# Patient Record
Sex: Male | Born: 1970
Health system: Southern US, Community
[De-identification: ages and names within clinical notes are randomized; demographics above are authoritative.]

## PROBLEM LIST (undated history)

## (undated) DIAGNOSIS — Z9119 Patient's noncompliance with other medical treatment and regimen: Secondary | ICD-10-CM

## (undated) DIAGNOSIS — M545 Low back pain, unspecified: Secondary | ICD-10-CM

## (undated) DIAGNOSIS — Z87442 Personal history of urinary calculi: Secondary | ICD-10-CM

## (undated) DIAGNOSIS — Z9289 Personal history of other medical treatment: Secondary | ICD-10-CM

## (undated) DIAGNOSIS — G4733 Obstructive sleep apnea (adult) (pediatric): Secondary | ICD-10-CM

## (undated) DIAGNOSIS — G4700 Insomnia, unspecified: Secondary | ICD-10-CM

## (undated) DIAGNOSIS — Z91199 Patient's noncompliance with other medical treatment and regimen due to unspecified reason: Secondary | ICD-10-CM

## (undated) DIAGNOSIS — F419 Anxiety disorder, unspecified: Secondary | ICD-10-CM

## (undated) DIAGNOSIS — M25562 Pain in left knee: Secondary | ICD-10-CM

## (undated) DIAGNOSIS — I10 Essential (primary) hypertension: Secondary | ICD-10-CM

## (undated) DIAGNOSIS — F329 Major depressive disorder, single episode, unspecified: Secondary | ICD-10-CM

## (undated) DIAGNOSIS — F32A Depression, unspecified: Secondary | ICD-10-CM

## (undated) HISTORY — DX: Obstructive sleep apnea (adult) (pediatric): G47.33

## (undated) HISTORY — DX: Pain in left knee: M25.562

## (undated) HISTORY — DX: Anxiety disorder, unspecified: F41.9

## (undated) HISTORY — DX: Insomnia, unspecified: G47.00

## (undated) HISTORY — DX: Personal history of other medical treatment: Z92.89

## (undated) HISTORY — DX: Low back pain, unspecified: M54.50

---

## 1996-12-29 HISTORY — PX: MANDIBLE SURGERY: SHX707

## 2001-06-15 ENCOUNTER — Emergency Department (HOSPITAL_COMMUNITY): Admission: EM | Admit: 2001-06-15 | Discharge: 2001-06-15 | Payer: Self-pay | Admitting: Emergency Medicine

## 2001-07-02 ENCOUNTER — Ambulatory Visit (HOSPITAL_BASED_OUTPATIENT_CLINIC_OR_DEPARTMENT_OTHER): Admission: RE | Admit: 2001-07-02 | Discharge: 2001-07-02 | Payer: Self-pay | Admitting: Surgery

## 2001-11-16 ENCOUNTER — Emergency Department (HOSPITAL_COMMUNITY): Admission: EM | Admit: 2001-11-16 | Discharge: 2001-11-16 | Payer: Self-pay | Admitting: Emergency Medicine

## 2005-04-19 ENCOUNTER — Emergency Department (HOSPITAL_COMMUNITY): Admission: EM | Admit: 2005-04-19 | Discharge: 2005-04-19 | Payer: Self-pay | Admitting: Emergency Medicine

## 2005-04-19 IMAGING — CT CT HEAD W/O CM
1 series · 16 of 30 positions shown, 20 images · non-contrast
Comparison: none

CLINICAL DATA: Left-sided headache with hypertension. 
 CT BRAIN:
 The cerebral and cerebellar parenchyma are symmetric and normal in appearance.  Negative for extraaxial fluid collections.  Mastoid air cells are clear.  Well circumscribed soft tissue density within the left maxillary sinus, mucus retention cyst versus polyp.

[Series 2: brain · axial · 0.49mm/px · z∈[+158,+294]mm · 16 of 30 slices shown, 20 images]
[im 2/30  brain]
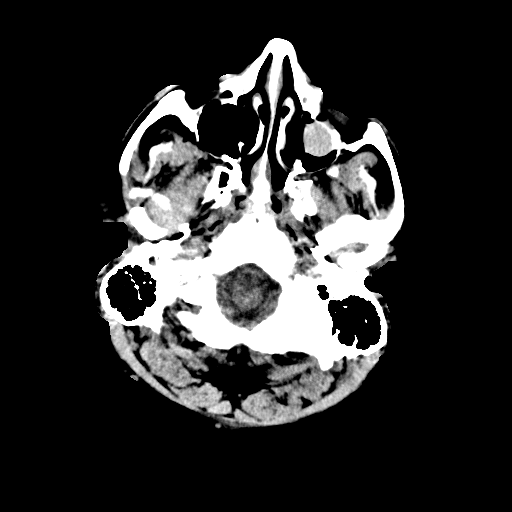
[im 2/30  bone]
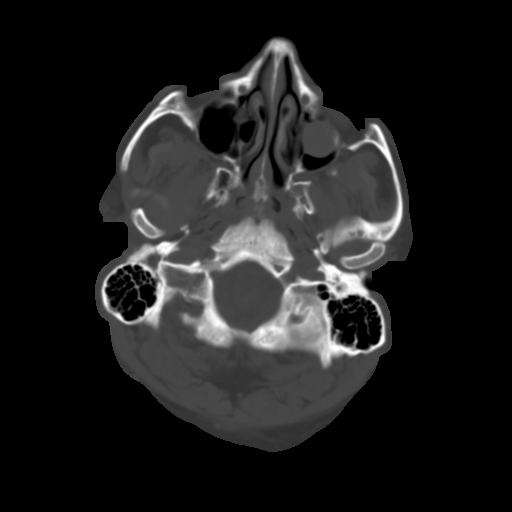
[im 4/30  brain]
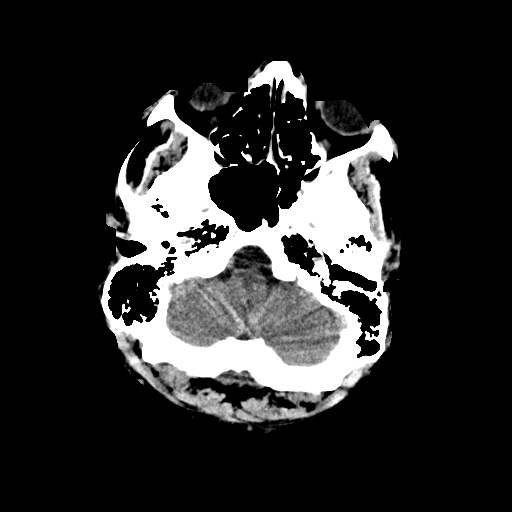
[im 6/30  brain]
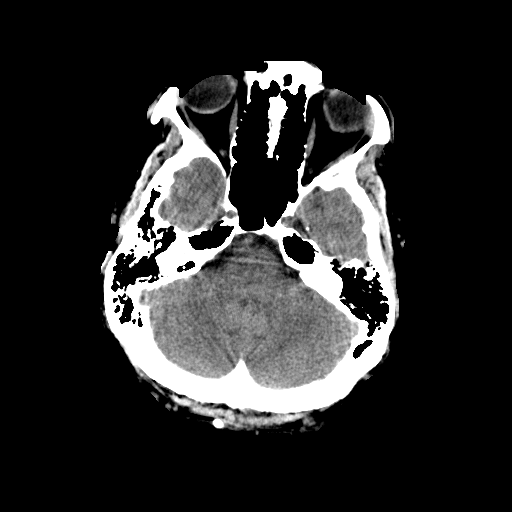
[im 8/30  brain]
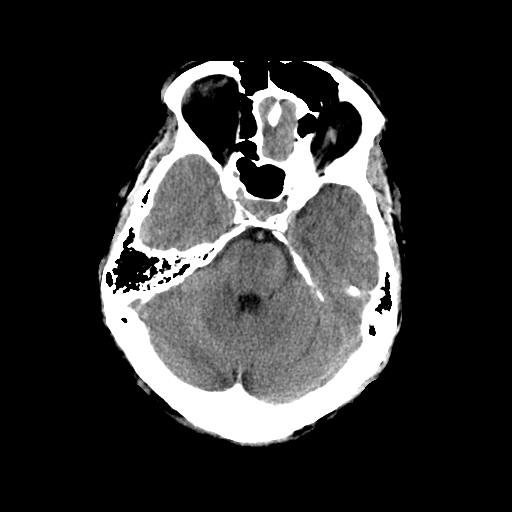
[im 9/30  brain]
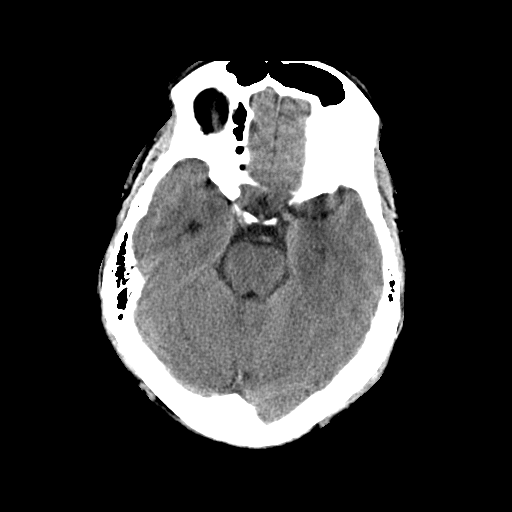
[im 9/30  bone]
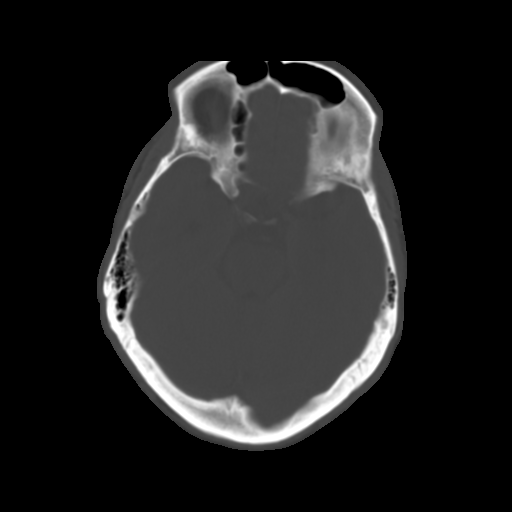
[im 11/30  brain]
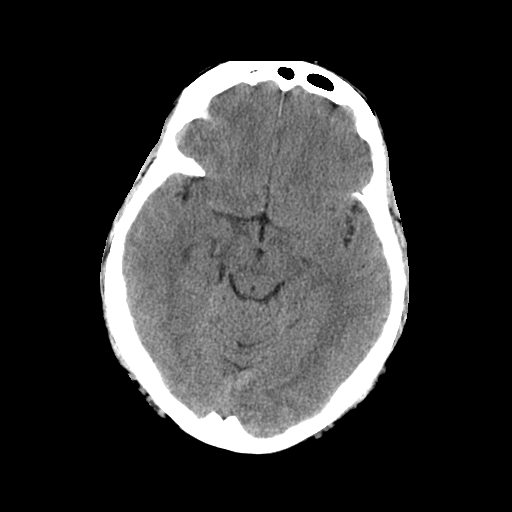
[im 13/30  brain]
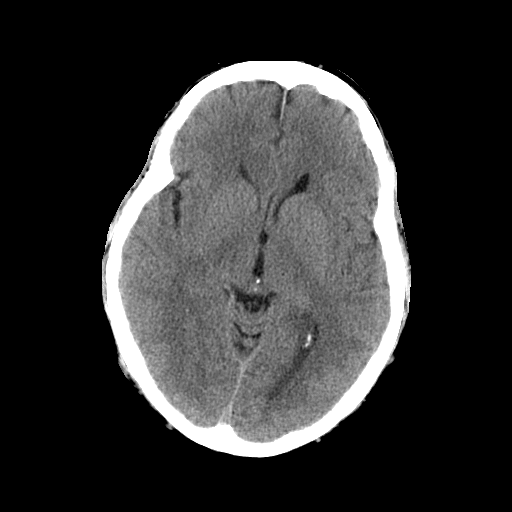
[im 15/30  brain]
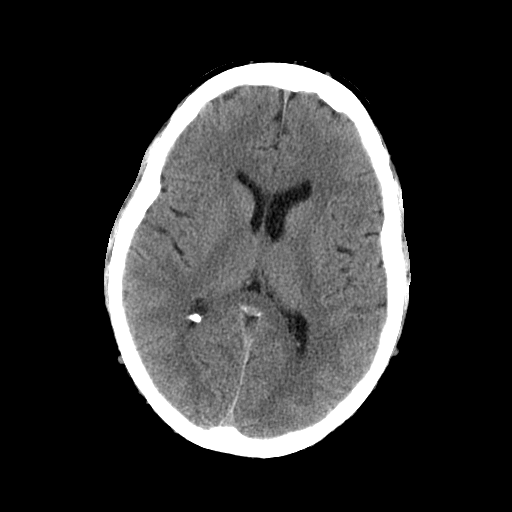
[im 16/30  brain]
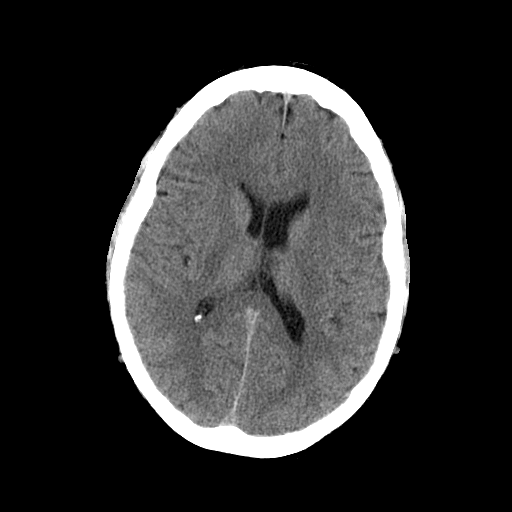
[im 16/30  bone]
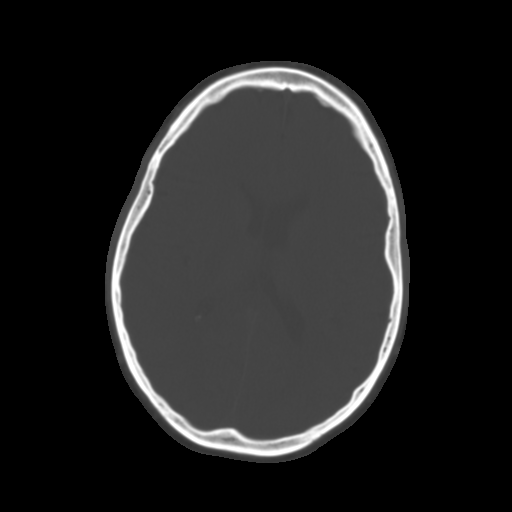
[im 18/30  brain]
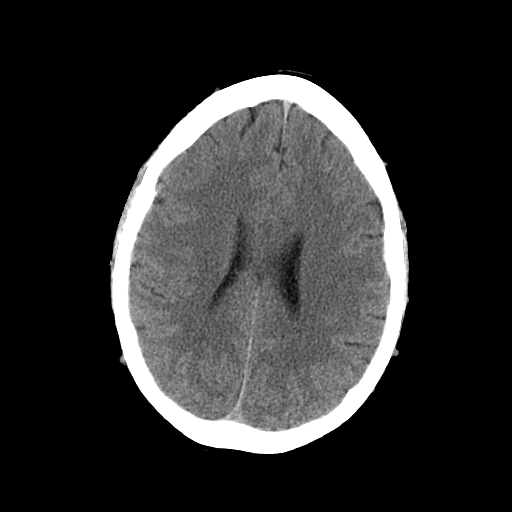
[im 20/30  brain]
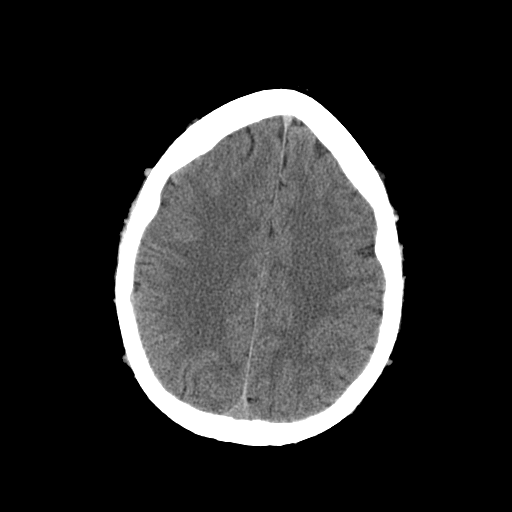
[im 22/30  brain]
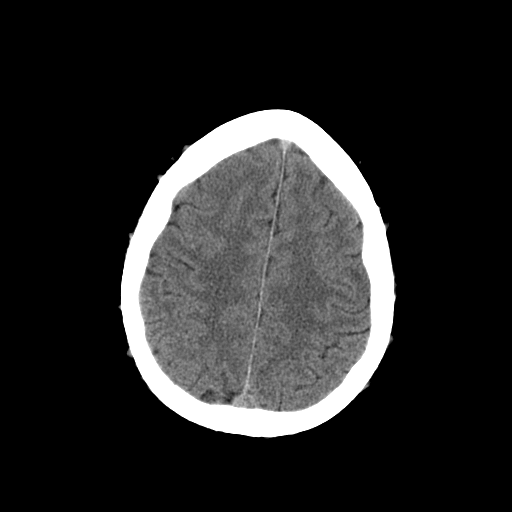
[im 23/30  brain]
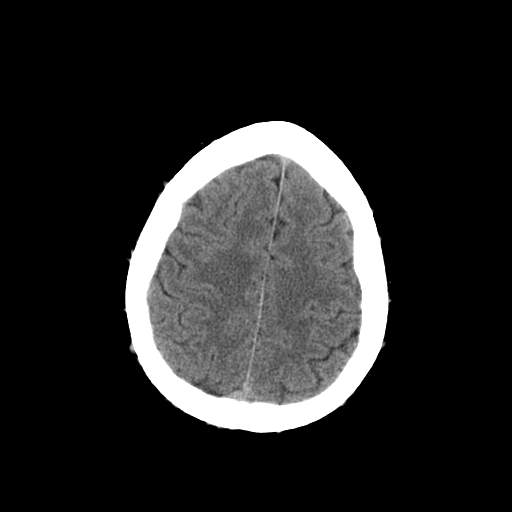
[im 23/30  bone]
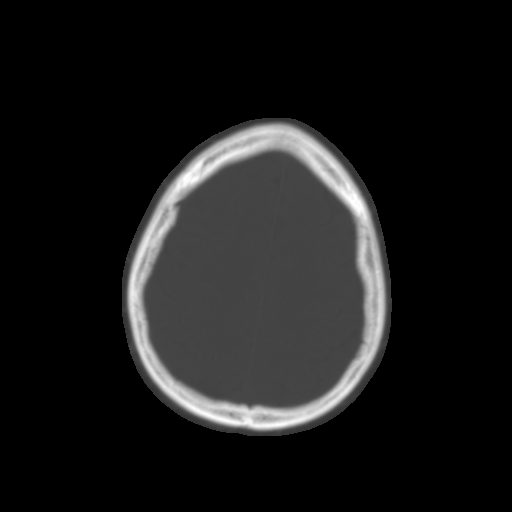
[im 25/30  brain]
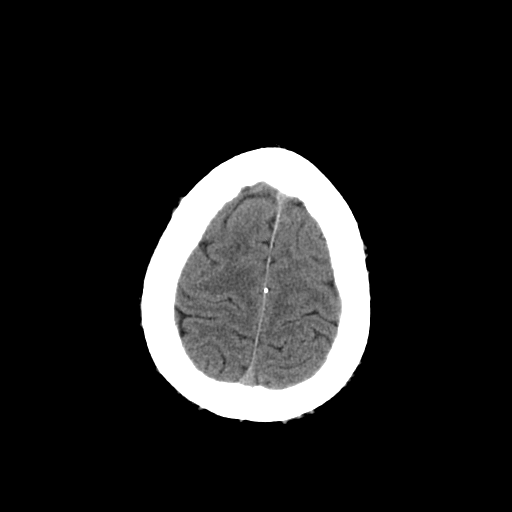
[im 27/30  brain]
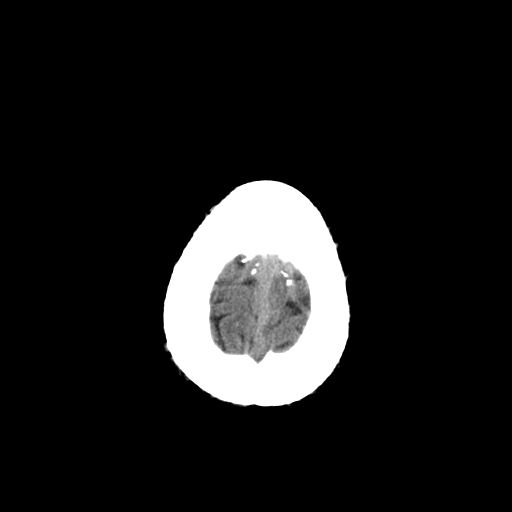
[im 29/30  brain]
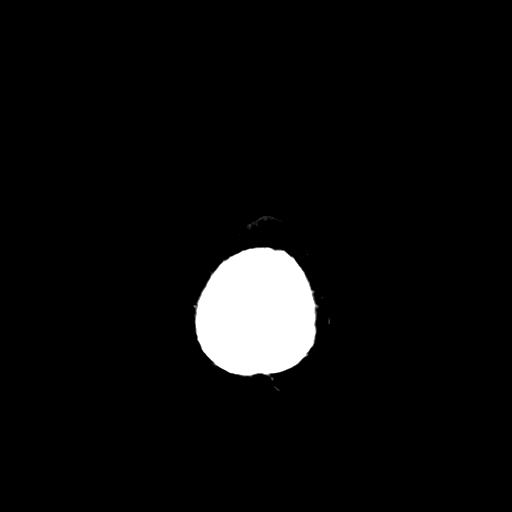

[16 of 30 positions shown; findings below may reference images not displayed]

IMPRESSION: Negative for acute intracranial hemorrhage or edema.

## 2008-03-26 ENCOUNTER — Emergency Department (HOSPITAL_COMMUNITY): Admission: EM | Admit: 2008-03-26 | Discharge: 2008-03-26 | Payer: Self-pay | Admitting: Emergency Medicine

## 2008-03-26 IMAGING — CT CT HEAD W/O CM
1 series · 16 of 30 positions shown, 20 images · non-contrast
Comparison: CT head [DATE]

CLINICAL DATA: Headache

CT HEAD WITHOUT CONTRAST
TECHNIQUE: Contiguous axial images were obtained from the base of
the skull through the vertex without contrast.

[Series 2: brain · axial · 0.47mm/px · z∈[+141,+282]mm · 16 of 32 slices shown, 20 images]
[im 2/32  brain]
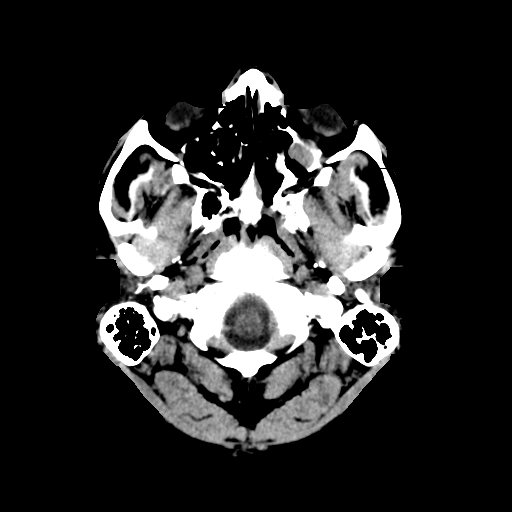
[im 2/32  bone]
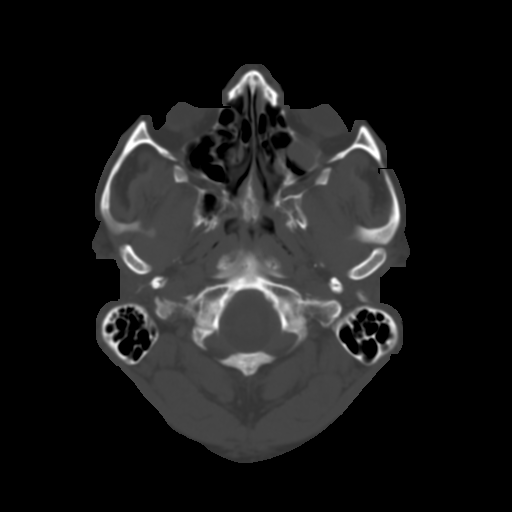
[im 4/32  brain]
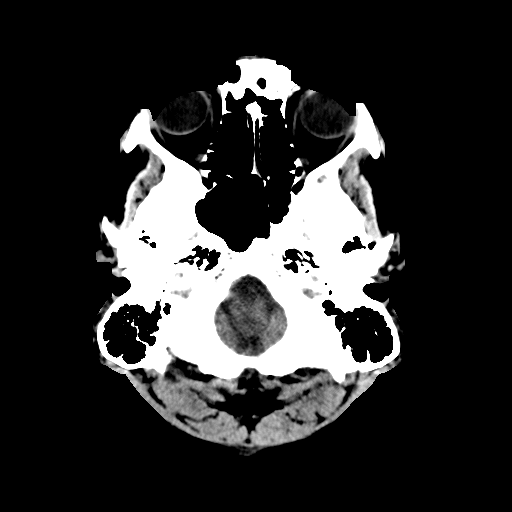
[im 6/32  brain]
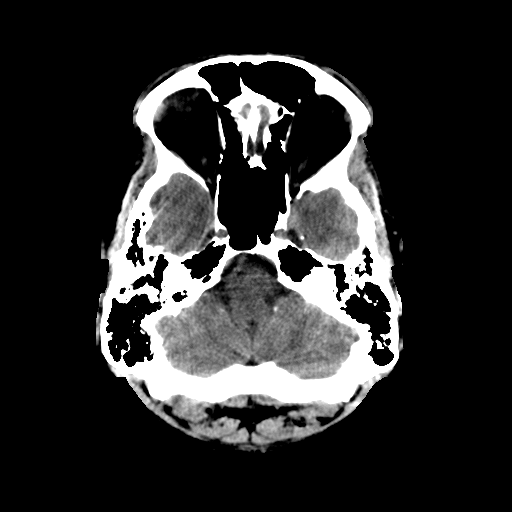
[im 8/32  brain]
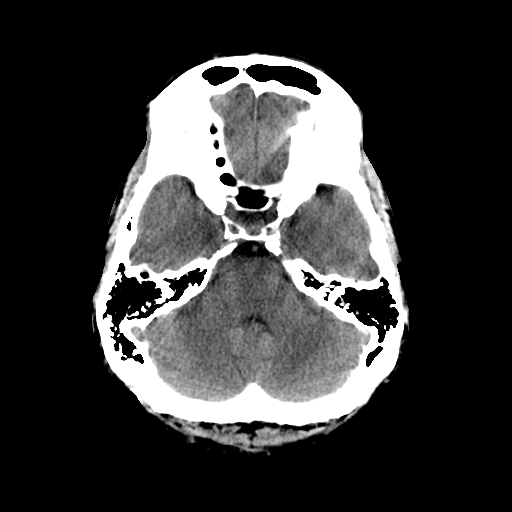
[im 9/32  brain]
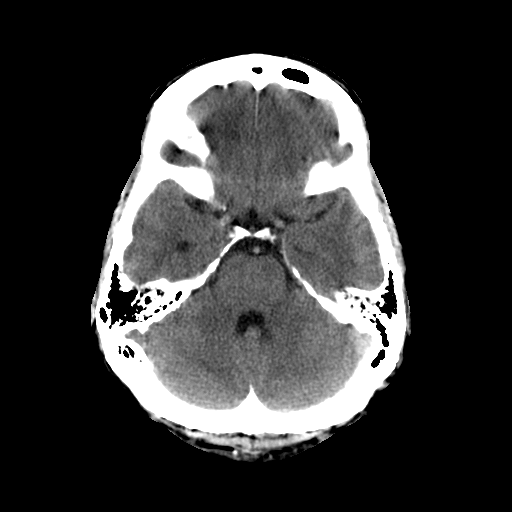
[im 9/32  bone]
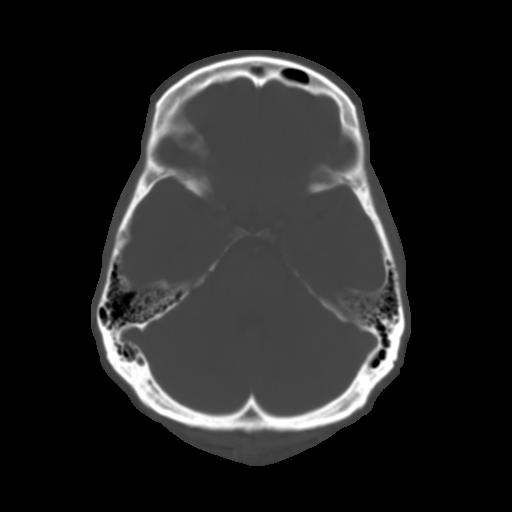
[im 11/32  brain]
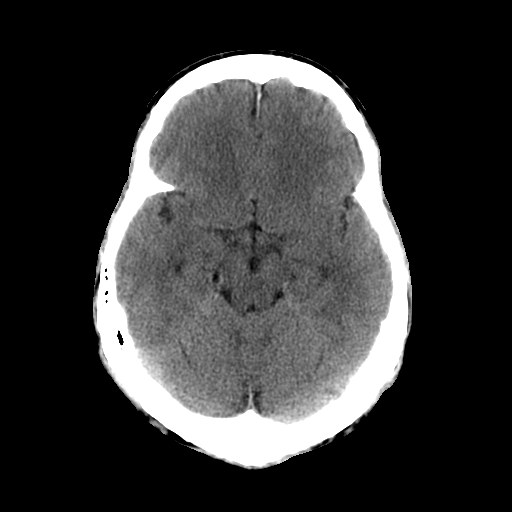
[im 13/32  brain]
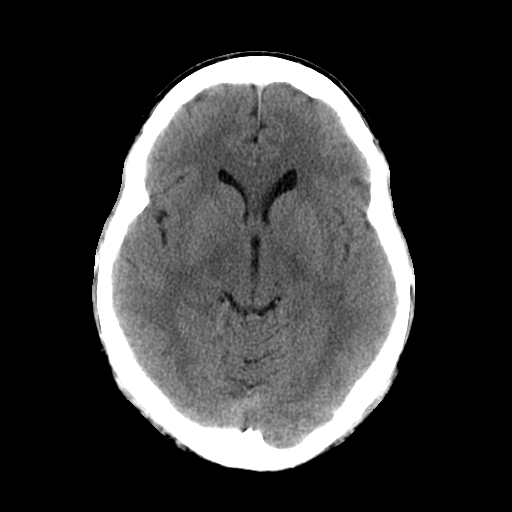
[im 15/32  brain]
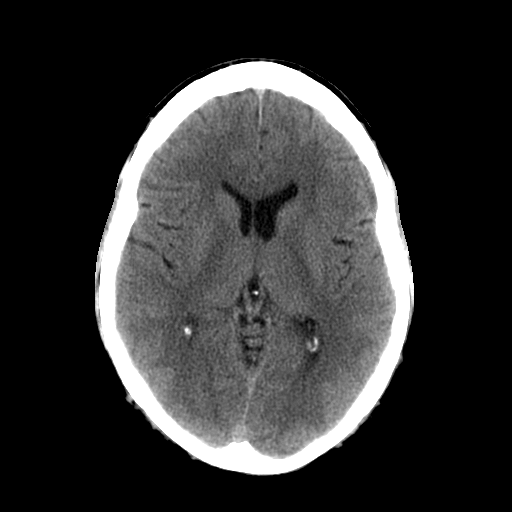
[im 17/32  brain]
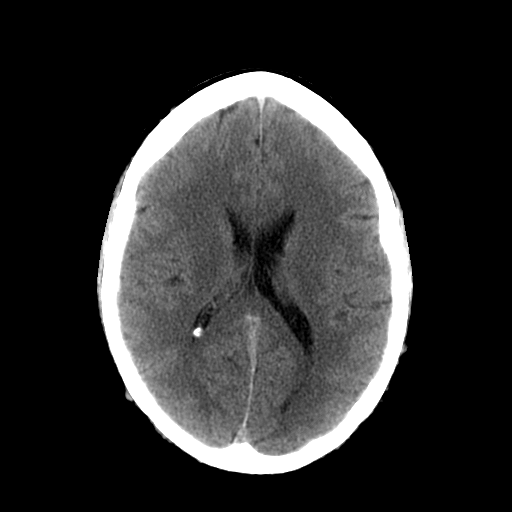
[im 17/32  bone]
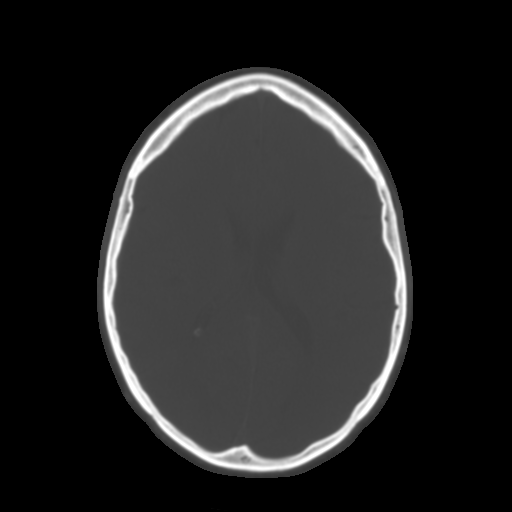
[im 19/32  brain]
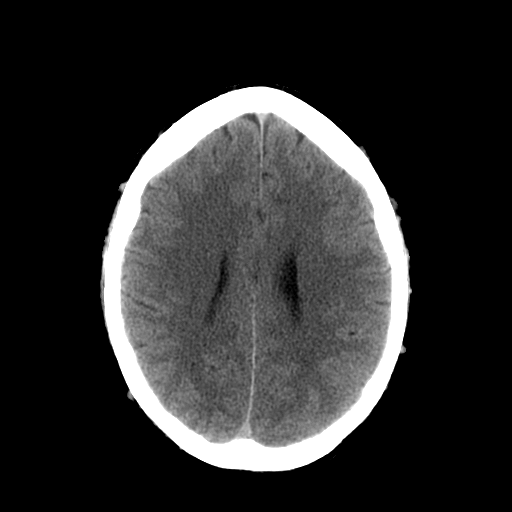
[im 21/32  brain]
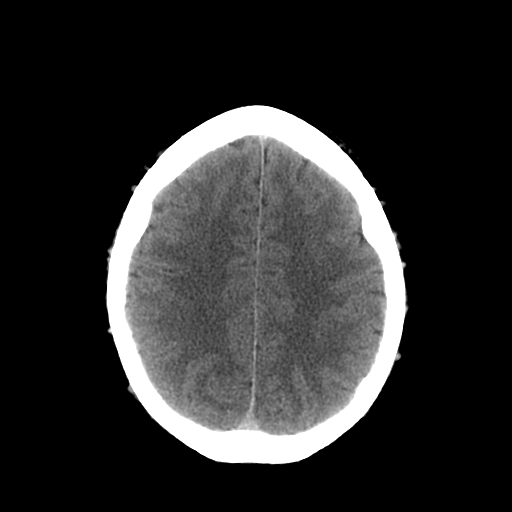
[im 23/32  brain]
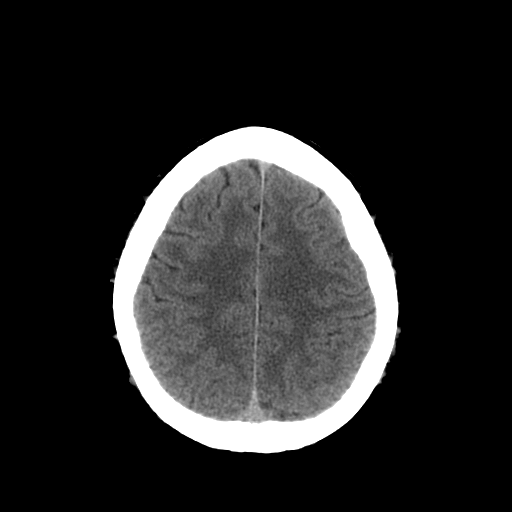
[im 24/32  brain]
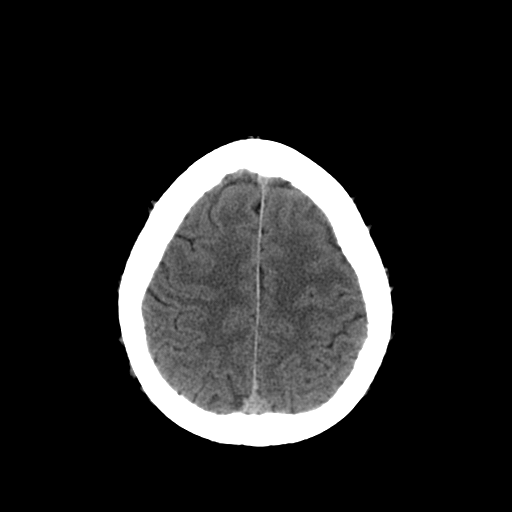
[im 24/32  bone]
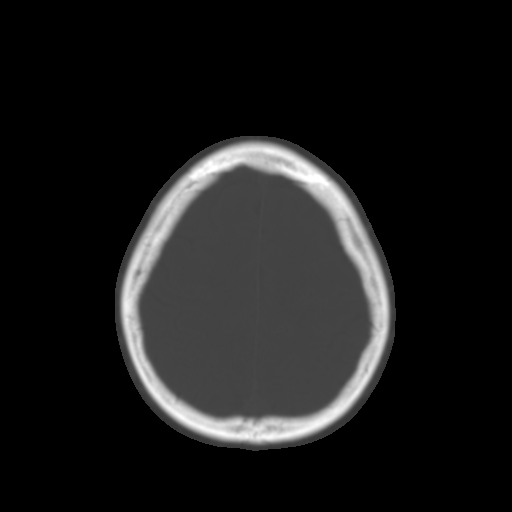
[im 26/32  brain]
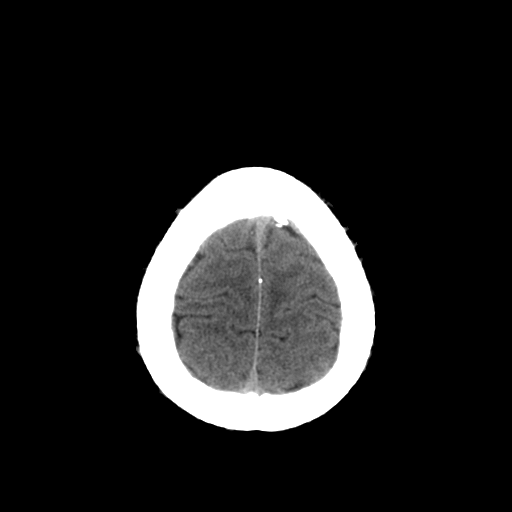
[im 28/32  brain]
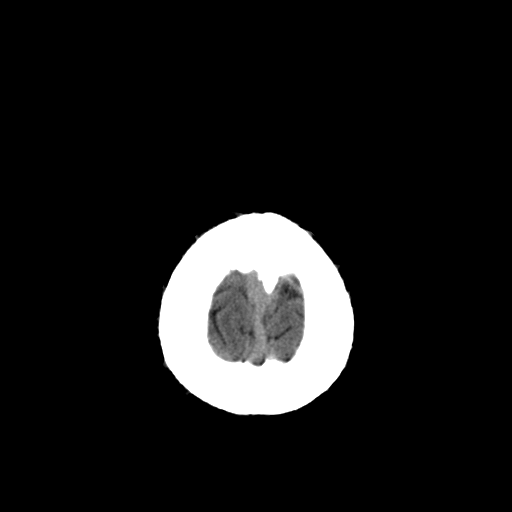
[im 30/32  brain]
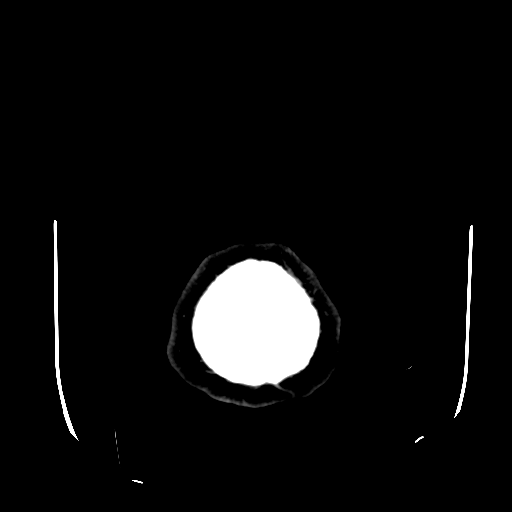

[16 of 30 positions shown; findings below may reference images not displayed]

FINDINGS: The cerebral and cerebellar parenchyma are symmetric and
normal in appearance.  There is no acute intracranial hemorrhage or
edema.  The ventricles and basilar cisterns midline without
effacement or mass effect.

Mastoid air cells sinuses are clear.
IMPRESSION: Negative for acute intracranial hemorrhage or edema.  Sinuses
clear.

## 2009-05-02 ENCOUNTER — Emergency Department (HOSPITAL_COMMUNITY): Admission: EM | Admit: 2009-05-02 | Discharge: 2009-05-03 | Payer: Self-pay | Admitting: Emergency Medicine

## 2009-06-22 ENCOUNTER — Ambulatory Visit: Payer: Self-pay | Admitting: Internal Medicine

## 2009-06-27 ENCOUNTER — Ambulatory Visit: Payer: Self-pay | Admitting: Internal Medicine

## 2009-07-04 ENCOUNTER — Ambulatory Visit: Payer: Self-pay | Admitting: Internal Medicine

## 2009-08-06 ENCOUNTER — Ambulatory Visit: Payer: Self-pay | Admitting: Internal Medicine

## 2009-11-05 ENCOUNTER — Ambulatory Visit: Payer: Self-pay | Admitting: Internal Medicine

## 2009-11-19 ENCOUNTER — Ambulatory Visit: Payer: Self-pay | Admitting: Internal Medicine

## 2010-04-01 ENCOUNTER — Emergency Department (HOSPITAL_COMMUNITY): Admission: EM | Admit: 2010-04-01 | Discharge: 2010-04-01 | Payer: Self-pay | Admitting: Emergency Medicine

## 2010-08-15 ENCOUNTER — Emergency Department (HOSPITAL_COMMUNITY): Admission: EM | Admit: 2010-08-15 | Discharge: 2010-08-15 | Payer: Self-pay | Admitting: Emergency Medicine

## 2011-01-08 ENCOUNTER — Emergency Department (HOSPITAL_COMMUNITY)
Admission: EM | Admit: 2011-01-08 | Discharge: 2011-01-08 | Payer: Self-pay | Source: Home / Self Care | Admitting: Family Medicine

## 2011-04-08 LAB — COMPREHENSIVE METABOLIC PANEL
AST: 17 U/L (ref 0–37)
Albumin: 4 g/dL (ref 3.5–5.2)
Alkaline Phosphatase: 65 U/L (ref 39–117)
Calcium: 8.9 mg/dL (ref 8.4–10.5)
Chloride: 107 mEq/L (ref 96–112)
Creatinine, Ser: 1.02 mg/dL (ref 0.4–1.5)
GFR calc Af Amer: >60 mL/min (ref >60)
Total Bilirubin: 0.7 mg/dL (ref 0.3–1.2)

## 2011-04-08 LAB — CBC
HCT: 39.6 % (ref 39.0–52.0)
Hemoglobin: 13.6 g/dL (ref 13.0–17.0)
MCV: 87.7 fL (ref 78.0–100.0)
Platelets: 173 10*3/uL (ref 150–400)
RDW: 14.5 % (ref 11.5–15.5)

## 2011-04-08 LAB — DIFFERENTIAL
Basophils Absolute: 0 10*3/uL (ref 0.0–0.1)
Basophils Relative: 0 % (ref 0–1)
Eosinophils Absolute: 0 10*3/uL (ref 0.0–0.7)
Eosinophils Relative: 0 % (ref 0–5)
Monocytes Absolute: 0.3 10*3/uL (ref 0.1–1.0)
Monocytes Relative: 4 % (ref 3–12)

## 2011-04-08 LAB — URINALYSIS, ROUTINE W REFLEX MICROSCOPIC
Glucose, UA: NEGATIVE mg/dL
Ketones, ur: NEGATIVE mg/dL
Nitrite: NEGATIVE
Protein, ur: NEGATIVE mg/dL
Urobilinogen, UA: 1 mg/dL (ref 0.0–1.0)
pH: 6 (ref 5.0–8.0)

## 2011-05-16 NOTE — Op Note (Signed)
[facility]  Patient:    CORDMadex, Seals                          MRN: [id number] Proc. Date: 07/02/01 Adm. Date:  [id number] Disc. Date: [id number] Attending:  Shelba Flake                           Operative Report  PREOPERATIVE DIAGNOSIS:  Posterior neck mass.  POSTOPERATIVE DIAGNOSIS:  Posterior neck mass.  OPERATION PERFORMED:  Excision of posterior neck mass.  SURGEON:  Abigail Miyamoto, M.D.  ANESTHESIA:  1% lidocaine with epinephrine.  ESTIMATED BLOOD LOSS:  Minimal.  DESCRIPTION OF PROCEDURE:  Patient brought to operating room and identified as Alecia Lemming Trzcinski.  He was placed in a prone position on the operating table.  His neck was then prepped and draped in the usual sterile fashion.  The skin overlying the posterior neck mass was then anesthetized with 1% lidocaine.  A small incision was then made with a 15 blade.  The incision was carried down with the scalpel to the mass, which was removed in its entirety and was consistent with a sebaceous cyst.  It was approximately 1 cm in size.  The wound was then irrigated with normal saline.  Hemostasis was achieved with the pencil cautery.  The wound was then again irrigated.  It was closed with interrupted 3-0 Vicryl sutures.  Steri-Strips were then applied.  The patient tolerated the procedure well.  All needle and instrument counts were correct at the end of the procedure.  The patient was taken in stable condition to the recovery room. DD:  07/02/01 TD:  07/02/01 Job: [id number]

## 2012-01-02 ENCOUNTER — Emergency Department (INDEPENDENT_AMBULATORY_CARE_PROVIDER_SITE_OTHER)
Admission: EM | Admit: 2012-01-02 | Discharge: 2012-01-02 | Disposition: A | Discharge status: Home / Self Care | Payer: Medicare Other | Source: Home / Self Care | Attending: Family Medicine | Admitting: Family Medicine

## 2012-01-02 ENCOUNTER — Encounter: Payer: Self-pay | Admitting: *Deleted

## 2012-01-02 DIAGNOSIS — Z760 Encounter for issue of repeat prescription: Secondary | ICD-10-CM

## 2012-01-02 DIAGNOSIS — I10 Essential (primary) hypertension: Secondary | ICD-10-CM

## 2012-01-02 DIAGNOSIS — G43909 Migraine, unspecified, not intractable, without status migrainosus: Secondary | ICD-10-CM

## 2012-01-02 HISTORY — DX: Essential (primary) hypertension: I10

## 2012-01-02 MED ORDER — METOCLOPRAMIDE HCL 10 MG PO TABS: ORAL_TABLET | ORAL | Status: DC

## 2012-01-02 MED ORDER — TRAMADOL HCL 50 MG PO TABS: 50.0000 mg | ORAL_TABLET | Freq: Three times a day (TID) | ORAL | Status: AC | PRN

## 2012-01-02 MED ORDER — ONDANSETRON 4 MG PO TBDP
ORAL_TABLET | ORAL | Status: AC
  Filled 2012-01-02: qty 2

## 2012-01-02 MED ORDER — HYDROCODONE-ACETAMINOPHEN 5-325 MG PO TABS
1.0000 | ORAL_TABLET | Freq: Once | ORAL | Status: AC
  Administered 2012-01-02: 1 via ORAL

## 2012-01-02 MED ORDER — ONDANSETRON 4 MG PO TBDP
8.0000 mg | ORAL_TABLET | Freq: Once | ORAL | Status: AC
  Administered 2012-01-02: 8 mg via ORAL

## 2012-01-02 MED ORDER — IBUPROFEN 600 MG PO TABS: 600.0000 mg | ORAL_TABLET | Freq: Three times a day (TID) | ORAL | Status: AC | PRN

## 2012-01-02 MED ORDER — TRIAMTERENE-HCTZ 37.5-25 MG PO TABS: 1.0000 | ORAL_TABLET | Freq: Every day | ORAL | Status: DC

## 2012-01-02 MED ORDER — HYDROCODONE-ACETAMINOPHEN 5-325 MG PO TABS
ORAL_TABLET | ORAL | Status: AC
  Filled 2012-01-02: qty 1

## 2012-01-02 NOTE — ED Notes (Signed)
Pt given oral antiemetic.  Instructed to start small, frequent sips of ginger ale; if he tolerates well after several minutes, then pain med will be administered.  Pt verbalized understanding.

## 2012-01-02 NOTE — ED Notes (Signed)
C/O nausea & vomiting since yesterday; reports emesis x 5 today and inability to keep anything down.  Denies diarrhea.  Unsure if fevers, but c/o chills, weakness, and HA.  Denies cough, congestion, or sore throat.  Unable to keep down acetaminophen he took for HA.

## 2012-01-02 NOTE — ED Notes (Signed)
Reports tolerating ginger ale well.  Denies any nausea.

## 2012-01-05 NOTE — ED Provider Notes (Signed)
History     CSN: [id number]  Arrival date & time 01/02/12  1546   First MD Initiated Contact with Patient 01/02/12 1622      Chief Complaint  Patient presents with  . Emesis  . Chills  . Headache    (Consider location/radiation/quality/duration/timing/severity/associated sxs/prior treatment) HPI Comments: 41 y/o male h/o HTN and Migraines comes c/o typical migraine headaches, nausea and vomiting food content x5 since yesterday. Also photophobia. Has been off his blood pressure medications for about 2 month and is triggering headaches. Feels tired and weak in general but denies fever, cough or congestion. No abdominal pain.     Past Medical History  Diagnosis Date  . Hypertension   . Migraine     History reviewed. No pertinent past surgical history.  No family history on file.  History  Substance Use Topics  . Smoking status: Not on file  . Smokeless tobacco: Not on file  . Alcohol Use: No      Review of Systems  Constitutional: Negative for fever and diaphoresis.  HENT: Negative for nosebleeds, congestion, sore throat, rhinorrhea, trouble swallowing, neck pain, dental problem and voice change.   Eyes: Positive for photophobia. Negative for pain, redness, itching and visual disturbance.  Cardiovascular: Negative for chest pain, palpitations and leg swelling.  Gastrointestinal: Positive for nausea and vomiting. Negative for abdominal pain, diarrhea and constipation.  Genitourinary: Negative for dysuria, frequency, hematuria and flank pain.  Musculoskeletal: Negative for back pain and arthralgias.  Skin: Negative for rash.  Neurological: Positive for headaches. Negative for tremors, seizures, facial asymmetry, weakness and numbness.  Psychiatric/Behavioral: Negative for agitation.    Allergies  Review of patient's allergies indicates no known allergies.  Home Medications   Current Outpatient Rx  Name Route Sig Dispense Refill  . IBUPROFEN 600 MG PO TABS Oral  Take 1 tablet (600 mg total) by mouth every 8 (eight) hours as needed for pain. 20 tablet 0  . LISINOPRIL 10 MG PO TABS Oral Take 10 mg by mouth daily. Hasn't gotten refill yet     . METOCLOPRAMIDE HCL 10 MG PO TABS  1 tablet po x 1 for migraines headache or nausea 10 tablet 0  . TRAMADOL HCL 50 MG PO TABS Oral Take 1 tablet (50 mg total) by mouth every 8 (eight) hours as needed for pain. 15 tablet 0  . TRIAMTERENE-HCTZ 37.5-25 MG PO TABS Oral Take 1 each (1 tablet total) by mouth daily. 30 tablet 0    BP 153/99  Pulse 85  Temp(Src) 98.4 F (36.9 C) (Oral)  Resp 16  SpO2 97%  Physical Exam  Nursing note and vitals reviewed. Constitutional: He is oriented to person, place, and time. He appears well-developed and well-nourished.  HENT:  Head: Normocephalic and atraumatic.  Right Ear: External ear normal.  Left Ear: External ear normal.  Nose: Nose normal.  Mouth/Throat: Oropharynx is clear and moist. No oropharyngeal exudate.  Eyes: Conjunctivae and EOM are normal. Pupils are equal, round, and reactive to light. No scleral icterus.  Neck: Normal range of motion. Neck supple. No JVD present.  Cardiovascular: Normal rate, regular rhythm, normal heart sounds and intact distal pulses.  Exam reveals no gallop and no friction rub.   No murmur heard. Pulmonary/Chest: Effort normal and breath sounds normal. No respiratory distress. He has no wheezes. He has no rales. He exhibits no tenderness.  Abdominal: Soft. He exhibits no distension and no mass. There is no tenderness. There is no rebound and no guarding.  Musculoskeletal: He exhibits no edema.  Lymphadenopathy:    He has no cervical adenopathy.  Neurological: He is alert and oriented to person, place, and time. He has normal reflexes. No cranial nerve deficit. He exhibits normal muscle tone. Coordination normal.  Skin: Skin is warm. No rash noted.  Psychiatric: Thought content normal.    ED Course  Procedures (including critical care  time)  Labs Reviewed - No data to display No results found.   1. Migraine   2. Medication refill   3. Hypertension       MDM          Sharin Grave, MD 01/05/12 [phone]

## 2012-03-18 ENCOUNTER — Encounter: Payer: Self-pay | Admitting: Family Medicine

## 2012-03-25 ENCOUNTER — Encounter: Payer: Self-pay | Admitting: Family Medicine

## 2012-04-28 HISTORY — PX: OTHER SURGICAL HISTORY: SHX169

## 2012-04-29 ENCOUNTER — Ambulatory Visit: Payer: 59

## 2012-04-29 ENCOUNTER — Ambulatory Visit (INDEPENDENT_AMBULATORY_CARE_PROVIDER_SITE_OTHER): Payer: 59 | Admitting: Internal Medicine

## 2012-04-29 VITALS — BP 170/110 | HR 56 | Temp 97.6°F | Resp 20 | Ht 69.5 in | Wt 284.4 lb

## 2012-04-29 DIAGNOSIS — G43109 Migraine with aura, not intractable, without status migrainosus: Secondary | ICD-10-CM

## 2012-04-29 DIAGNOSIS — R51 Headache: Secondary | ICD-10-CM

## 2012-04-29 DIAGNOSIS — I10 Essential (primary) hypertension: Secondary | ICD-10-CM

## 2012-04-29 LAB — POCT CBC
Granulocyte percent: 52.7 %G (ref 37–80)
HCT, POC: 42 % — AB (ref 43.5–53.7)
Hemoglobin: 13.9 g/dL — AB (ref 14.1–18.1)
MCH, POC: 28.5 pg (ref 27–31.2)
POC MID %: 4.7 %M (ref 0–12)
Platelet Count, POC: 211 10*3/uL (ref 142–424)
RDW, POC: 16.4 %
WBC: 4.7 10*3/uL (ref 4.6–10.2)

## 2012-04-29 LAB — POCT URINALYSIS DIPSTICK
Bilirubin, UA: NEGATIVE
Blood, UA: NEGATIVE
Nitrite, UA: NEGATIVE
Protein, UA: NEGATIVE
Urobilinogen, UA: 0.2

## 2012-04-29 IMAGING — CR DG CHEST 2V
2 series · 2 of 2 positions shown · non-contrast
Comparison: None.

CLINICAL DATA: Severe hypertension.

CHEST - 2 VIEW

[PA]
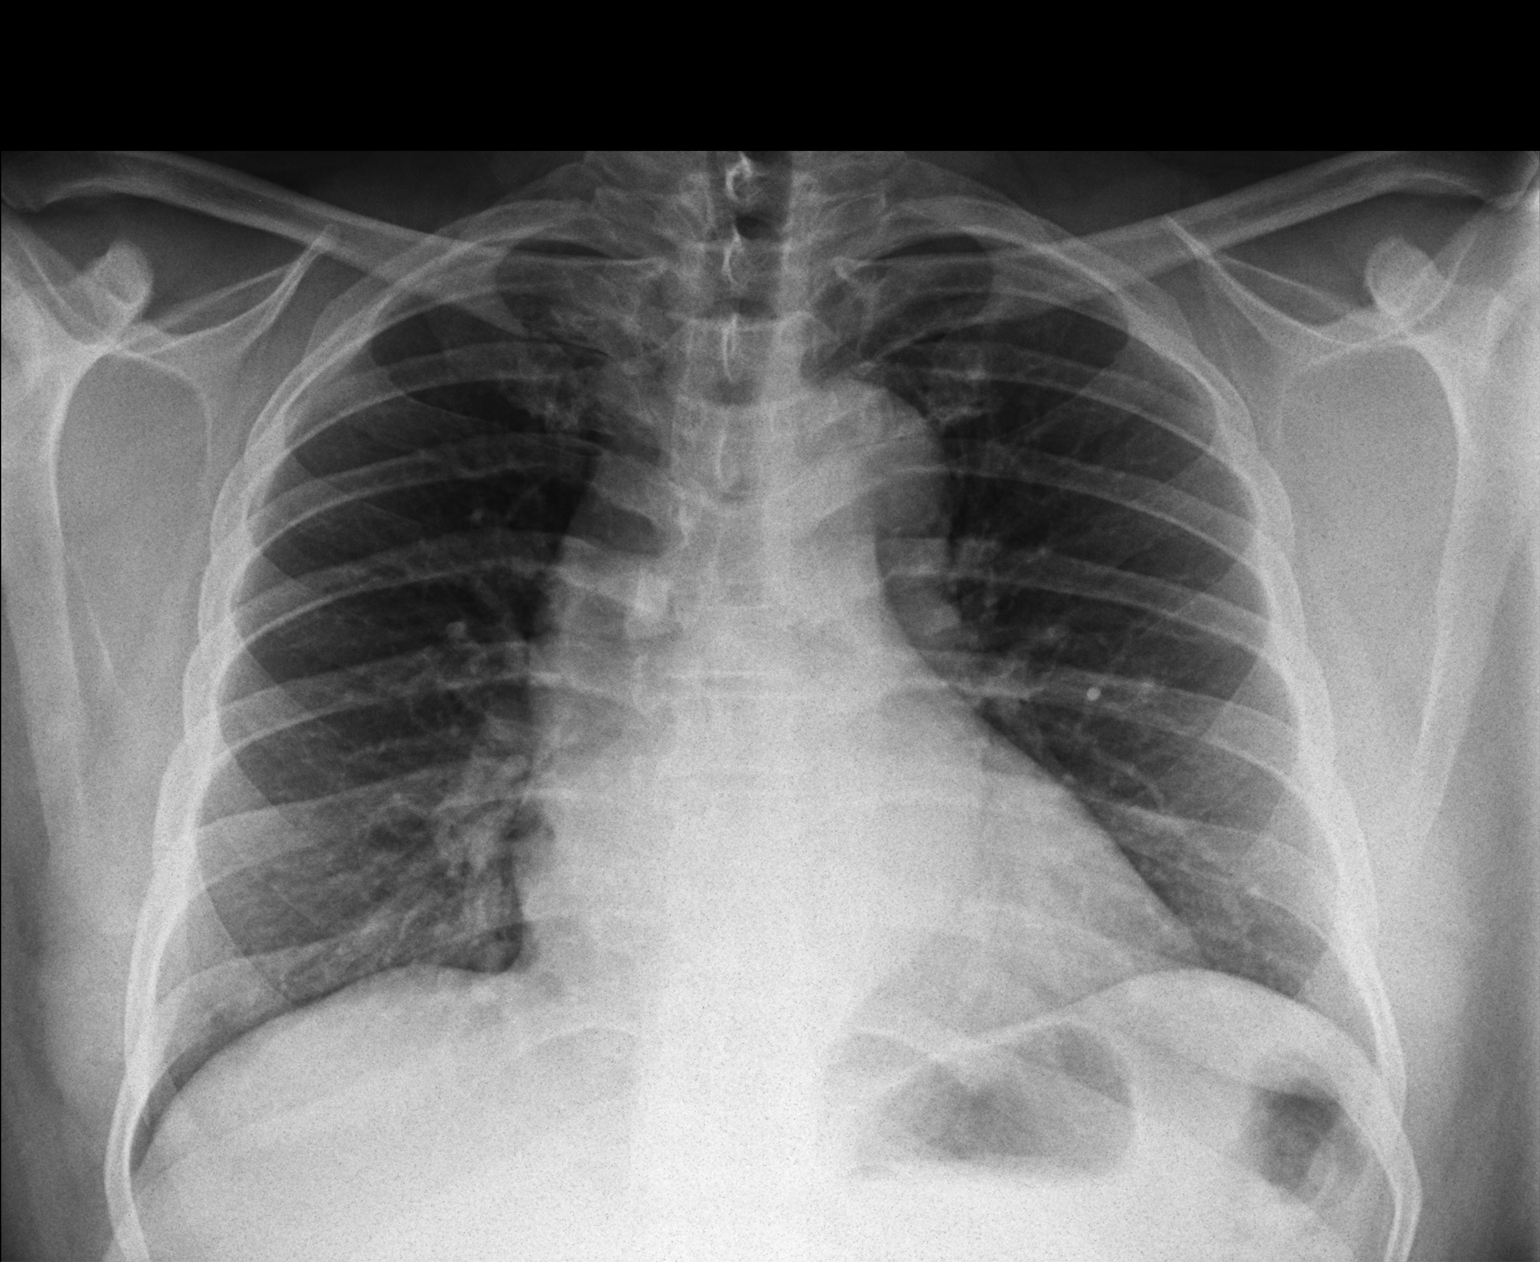

[lateral]
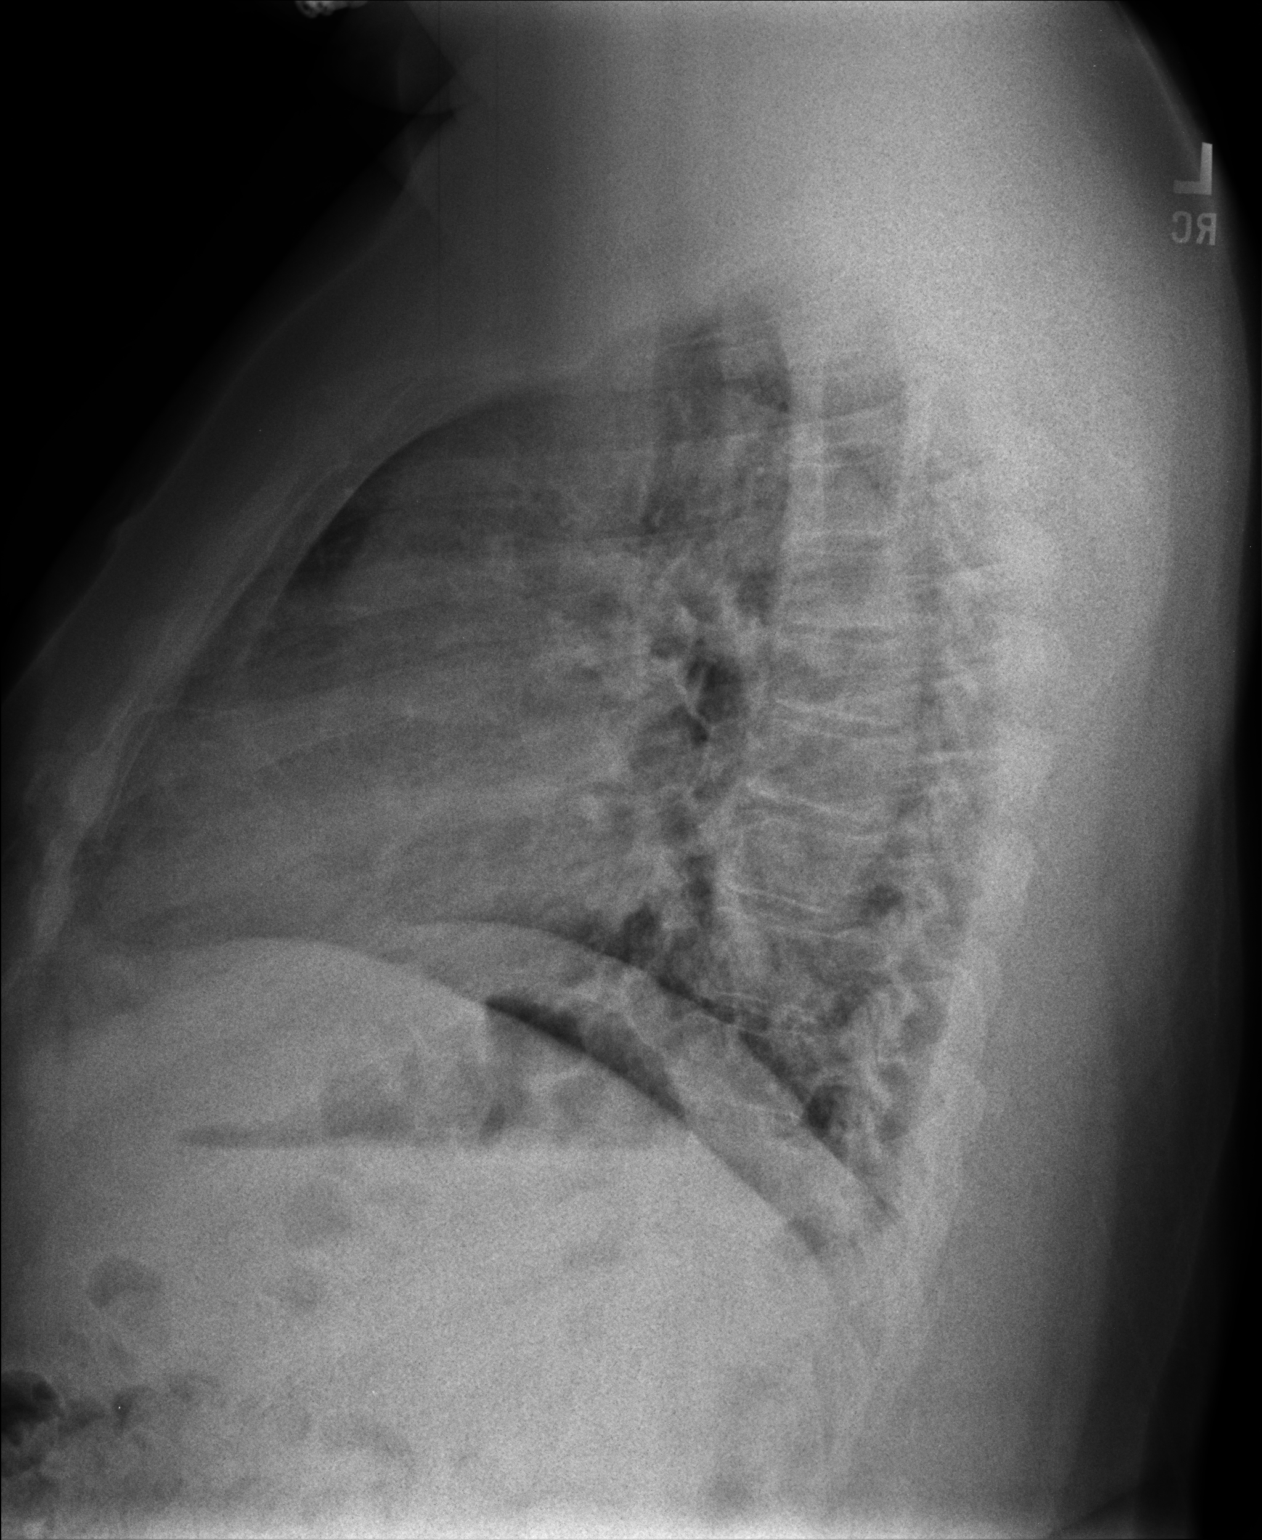

[2 of 2 positions shown; findings below may reference images not displayed]

FINDINGS: Trachea is midline.  Heart size within normal limits.
Thoracic aorta appears somewhat uncoiled.  Lungs are clear.  No
pleural fluid.
IMPRESSION: No acute findings.

Clinically significant discrepancy from primary report, if
provided: None

## 2012-04-29 MED ORDER — AMLODIPINE BESYLATE 10 MG PO TABS: 10.0000 mg | ORAL_TABLET | Freq: Every day | ORAL | Status: DC

## 2012-04-29 MED ORDER — HYDROCODONE-ACETAMINOPHEN 5-500 MG PO TABS: 1.0000 | ORAL_TABLET | Freq: Three times a day (TID) | ORAL | Status: AC | PRN

## 2012-04-29 MED ORDER — LISINOPRIL 20 MG PO TABS: 20.0000 mg | ORAL_TABLET | Freq: Every day | ORAL | Status: DC

## 2012-04-29 NOTE — Progress Notes (Signed)
PT given Clonidine 0.1mg  PO at 2:07pm. Eileen Stanford

## 2012-04-29 NOTE — Progress Notes (Signed)
  Subjective:    Patient ID: Michael [name], male    DOB: 1971-06-01, 41 y.o.   MRN: [id number]  HPI Hx of severe HTN and ran out of his meds. Also has hx of migrains and has a bad HA. No NMS loss, change in speech, chest pain, or hx of kidney disease. Used to weigh 400lbs and now at 288. Strong fhx DM  Give clonidine .1mg  now Review of Systems     Objective:   Physical Exam See repeat VS Lungs clear Heart normal Neuro normal EKG lv strain Labs [facility] reading (PRIMARY) by  Dr.Jarmar Rousseau. cardiomegaly Results for orders placed in visit on 04/29/12  POCT CBC      Component Value Range   WBC 4.7  4.6 - 10.2 (K/uL)   Lymph, poc 2.0  0.6 - 3.4    POC LYMPH PERCENT 42.6  10 - 50 (%L)   MID (cbc) 0.2  0 - 0.9    POC MID % 4.7  0 - 12 (%M)   POC Granulocyte 2.5  2 - 6.9    Granulocyte percent 52.7  37 - 80 (%G)   RBC 4.87  4.69 - 6.13 (M/uL)   Hemoglobin 13.9 (*) 14.1 - 18.1 (g/dL)   HCT, POC 78.2 (*) 95.6 - 53.7 (%)   MCV 86.3  80 - 97 (fL)   MCH, POC 28.5  27 - 31.2 (pg)   MCHC 33.1  31.8 - 35.4 (g/dL)   RDW, POC 21.3     Platelet Count, POC 211  142 - 424 (K/uL)   MPV 9.8  0 - 99.8 (fL)  GLUCOSE, POCT (MANUAL RESULT ENTRY)      Component Value Range   POC Glucose 112    POCT GLYCOSYLATED HEMOGLOBIN (HGB A1C)      Component Value Range   Hemoglobin A1C 5.7    POCT URINALYSIS DIPSTICK      Component Value Range   Color, UA yellow     Clarity, UA clear     Glucose, UA negative     Bilirubin, UA negative     Ketones, UA negative     Spec Grav, UA 1.020     Blood, UA negative     pH, UA 7.0     Protein, UA negative     Urobilinogen, UA 0.2     Nitrite, UA negative     Leukocytes, UA Negative           Assessment & Plan:  Severe HTN Severe probable Migraine Start lisinopril 20mg , amlodipine 10mg  qd Vicodin 5/325 prn pain Recheck in am

## 2012-04-29 NOTE — Patient Instructions (Addendum)
Hypertension  As your heart beats, it forces blood through your arteries. This force is your blood pressure. If the pressure is too high, it is called hypertension (HTN) or high blood pressure. HTN is dangerous because you may have it and not know it. High blood pressure may mean that your heart has to work harder to pump blood. Your arteries may be narrow or stiff. The extra work puts you at risk for heart disease, stroke, and other problems.    Blood pressure consists of two numbers, a higher number over a lower, 110/72, for example. It is stated as "110 over 72." The ideal is below 120 for the top number (systolic) and under 80 for the bottom (diastolic). Write down your blood pressure today.  You should pay close attention to your blood pressure if you have certain conditions such as:   Heart failure.   Prior heart attack.   Diabetes   Chronic kidney disease.   Prior stroke.   Multiple risk factors for heart disease.  To see if you have HTN, your blood pressure should be measured while you are seated with your arm held at the level of the heart. It should be measured at least twice. A one-time elevated blood pressure reading (especially in the Emergency Department) does not mean that you need treatment. There may be conditions in which the blood pressure is different between your right and left arms. It is important to see your caregiver soon for a recheck.  Most people have essential hypertension which means that there is not a specific cause. This type of high blood pressure may be lowered by changing lifestyle factors such as:   Stress.   Smoking.   Lack of exercise.   Excessive weight.   Drug/tobacco/alcohol use.   Eating less salt.  Most people do not have symptoms from high blood pressure until it has caused damage to the body. Effective treatment can often prevent, delay or reduce that damage.  TREATMENT     When a cause has been identified, treatment for high blood pressure is directed at the cause. There are a large number of medications to treat HTN. These fall into several categories, and your caregiver will help you select the medicines that are best for you. Medications may have side effects. You should review side effects with your caregiver.  If your blood pressure stays high after you have made lifestyle changes or started on medicines,     Your medication(s) may need to be changed.   Other problems may need to be addressed.   Be certain you understand your prescriptions, and know how and when to take your medicine.   Be sure to follow up with your caregiver within the time frame advised (usually within two weeks) to have your blood pressure rechecked and to review your medications.   If you are taking more than one medicine to lower your blood pressure, make sure you know how and at what times they should be taken. Taking two medicines at the same time can result in blood pressure that is too low.  SEEK IMMEDIATE MEDICAL CARE IF:   You develop a severe headache, blurred or changing vision, or confusion.   You have unusual weakness or numbness, or a faint feeling.   You have severe chest or abdominal pain, vomiting, or breathing problems.  MAKE SURE YOU:     Understand these instructions.   Will watch your condition.   Will get help right away if you   are not doing well or get worse.  Document Released: 12/15/2005 Document Revised: 12/04/2011 Document Reviewed: 08/04/2008  ExitCare Patient Information 2012 [employer].    DASH Diet   The DASH diet stands for "Dietary Approaches to Stop Hypertension." It is a healthy eating plan that has been shown to reduce high blood pressure (hypertension) in as little as 14 days, while also possibly providing other significant health benefits. These other health benefits include reducing the risk of breast cancer after menopause and reducing the risk of type 2 diabetes, heart disease, colon cancer, and stroke. Health benefits also include weight loss and slowing kidney failure in patients with chronic kidney disease.    DIET GUIDELINES   Limit salt (sodium). Your diet should contain less than 1500 mg of sodium daily.   Limit refined or processed carbohydrates. Your diet should include mostly whole grains. Desserts and added sugars should be used sparingly.   Include small amounts of heart-healthy fats. These types of fats include nuts, oils, and tub margarine. Limit saturated and trans fats. These fats have been shown to be harmful in the body.  CHOOSING FOODS    The following food groups are based on a 2000 calorie diet. See your Registered Dietitian for individual calorie needs.  Grains and Grain Products (6 to 8 servings daily)   Eat More Often: Whole-wheat bread, brown rice, whole-grain or wheat pasta, quinoa, popcorn without added fat or salt (air popped).   Eat Less Often: White bread, white pasta, white rice, cornbread.  Vegetables (4 to 5 servings daily)   Eat More Often: Fresh, frozen, and canned vegetables. Vegetables may be raw, steamed, roasted, or grilled with a minimal amount of fat.   Eat Less Often/Avoid: Creamed or fried vegetables. Vegetables in a cheese sauce.  Fruit (4 to 5 servings daily)   Eat More Often: All fresh, canned (in natural juice), or frozen fruits. Dried fruits without added sugar. One hundred percent fruit juice ( cup [237 mL] daily).   Eat Less Often: Dried fruits with added sugar. Canned fruit in light or heavy syrup.   Lean Meats, Fish, and Poultry (2 servings or less daily. One serving is 3 to 4 oz [85-114 g]).   Eat More Often: Ninety percent or leaner ground beef, tenderloin, sirloin. Round cuts of beef, chicken breast, turkey breast. All fish. Grill, bake, or broil your meat. Nothing should be fried.   Eat Less Often/Avoid: Fatty cuts of meat, turkey, or chicken leg, thigh, or wing. Fried cuts of meat or fish.  Dairy (2 to 3 servings)   Eat More Often: Low-fat or fat-free milk, low-fat plain or light yogurt, reduced-fat or part-skim cheese.   Eat Less Often/Avoid: Milk (whole, 2%, skim, or chocolate). Whole milk yogurt. Full-fat cheeses.  Nuts, Seeds, and Legumes (4 to 5 servings per week)   Eat More Often: All without added salt.   Eat Less Often/Avoid: Salted nuts and seeds, canned beans with added salt.  Fats and Sweets (limited)   Eat More Often: Vegetable oils, tub margarines without trans fats, sugar-free gelatin. Mayonnaise and salad dressings.   Eat Less Often/Avoid: Coconut oils, palm oils, butter, stick margarine, cream, half and half, cookies, candy, pie.  FOR MORE INFORMATION  The Dash Diet Eating Plan: www.dashdiet.org  Document Released: 12/04/2011 Document Reviewed: 11/24/2011  [employer] Patient Information 2012 [employer].

## 2012-04-30 ENCOUNTER — Ambulatory Visit (INDEPENDENT_AMBULATORY_CARE_PROVIDER_SITE_OTHER): Payer: 59 | Admitting: Internal Medicine

## 2012-04-30 ENCOUNTER — Encounter (HOSPITAL_COMMUNITY): Payer: Self-pay | Admitting: Emergency Medicine

## 2012-04-30 ENCOUNTER — Emergency Department (HOSPITAL_COMMUNITY): Payer: PRIVATE HEALTH INSURANCE

## 2012-04-30 ENCOUNTER — Observation Stay (HOSPITAL_COMMUNITY)
Admission: EM | Admit: 2012-04-30 | Discharge: 2012-05-02 | DRG: 305 | Disposition: A | Discharge status: Home / Self Care | Payer: PRIVATE HEALTH INSURANCE | Attending: Cardiology | Admitting: Cardiology

## 2012-04-30 VITALS — BP 189/136 | HR 69 | Temp 98.2°F | Resp 16 | Ht 69.5 in | Wt 280.0 lb

## 2012-04-30 DIAGNOSIS — E876 Hypokalemia: Secondary | ICD-10-CM | POA: Diagnosis not present

## 2012-04-30 DIAGNOSIS — Z9119 Patient's noncompliance with other medical treatment and regimen: Secondary | ICD-10-CM | POA: Insufficient documentation

## 2012-04-30 DIAGNOSIS — I10 Essential (primary) hypertension: Secondary | ICD-10-CM

## 2012-04-30 DIAGNOSIS — G43109 Migraine with aura, not intractable, without status migrainosus: Secondary | ICD-10-CM | POA: Diagnosis present

## 2012-04-30 DIAGNOSIS — Z91199 Patient's noncompliance with other medical treatment and regimen due to unspecified reason: Secondary | ICD-10-CM

## 2012-04-30 DIAGNOSIS — R51 Headache: Secondary | ICD-10-CM

## 2012-04-30 DIAGNOSIS — Z79899 Other long term (current) drug therapy: Secondary | ICD-10-CM

## 2012-04-30 HISTORY — DX: Depression, unspecified: F32.A

## 2012-04-30 HISTORY — DX: Patient's noncompliance with other medical treatment and regimen due to unspecified reason: Z91.199

## 2012-04-30 HISTORY — DX: Patient's noncompliance with other medical treatment and regimen: Z91.19

## 2012-04-30 HISTORY — DX: Major depressive disorder, single episode, unspecified: F32.9

## 2012-04-30 LAB — POCT I-STAT, CHEM 8
HCT: 47 % (ref 39.0–52.0)
Hemoglobin: 16 g/dL (ref 13.0–17.0)
Potassium: 4.2 mEq/L (ref 3.5–5.1)
Sodium: 141 mEq/L (ref 135–145)

## 2012-04-30 LAB — COMPREHENSIVE METABOLIC PANEL
ALT: 15 U/L (ref 0–53)
AST: 15 U/L (ref 0–37)
Albumin: 4.2 g/dL (ref 3.5–5.2)
Alkaline Phosphatase: 66 U/L (ref 39–117)
BUN: 12 mg/dL (ref 6–23)
CO2: 33 mEq/L — ABNORMAL HIGH (ref 19–32)
Chloride: 102 mEq/L (ref 96–112)

## 2012-04-30 LAB — CBC
MCH: 29.1 pg (ref 26.0–34.0)
Platelets: 203 10*3/uL (ref 150–400)
RBC: 4.92 MIL/uL (ref 4.22–5.81)

## 2012-04-30 LAB — DIFFERENTIAL
Basophils Relative: 0 % (ref 0–1)
Eosinophils Absolute: 0 10*3/uL (ref 0.0–0.7)
Lymphs Abs: 1.1 10*3/uL (ref 0.7–4.0)
Neutrophils Relative %: 81 % — ABNORMAL HIGH (ref 43–77)

## 2012-04-30 LAB — RAPID URINE DRUG SCREEN, HOSP PERFORMED
Benzodiazepines: NOT DETECTED
Cocaine: NOT DETECTED
Opiates: NOT DETECTED

## 2012-04-30 LAB — LIPID PANEL: Triglycerides: 61 mg/dL (ref <150)

## 2012-04-30 LAB — CARDIAC PANEL(CRET KIN+CKTOT+MB+TROPI): Relative Index: 2.1 (ref 0.0–2.5)

## 2012-04-30 IMAGING — CT CT HEAD W/O CM
1 series · 16 of 30 positions shown, 20 images · non-contrast
Comparison: [DATE]

CLINICAL DATA: Altered mental status, hypertension

CT HEAD WITHOUT CONTRAST
TECHNIQUE: Contiguous axial images were obtained from the base of
the skull through the vertex without contrast.

[Series 2: head routine 4.8 h37s · axial · 0.46mm/px · z∈[+1351,+1506]mm · 16 of 36 slices shown, 20 images]
[im 2/36  brain]
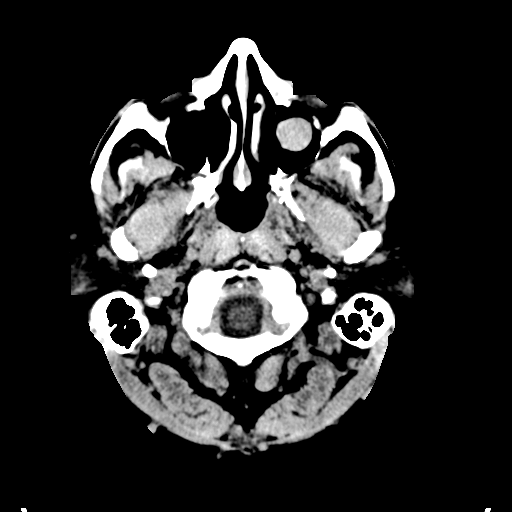
[im 2/36  bone]
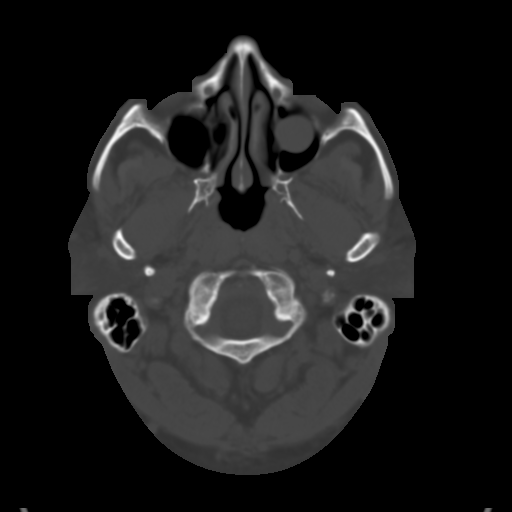
[im 4/36  brain]
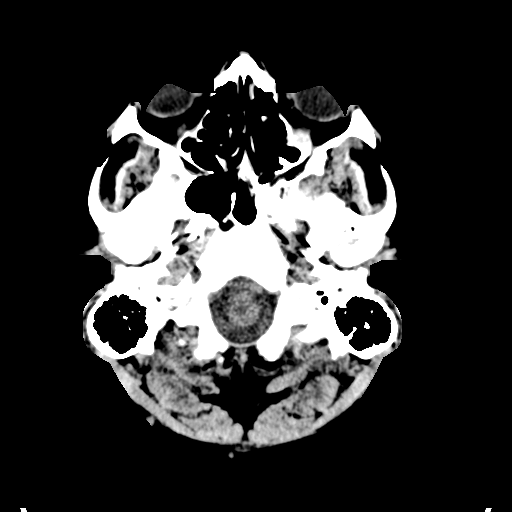
[im 7/36  brain]
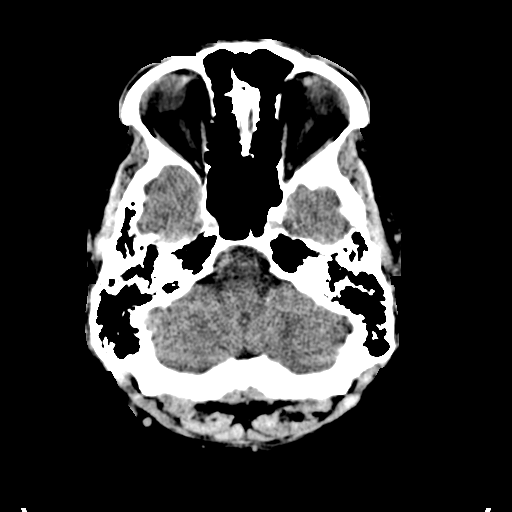
[im 9/36  brain]
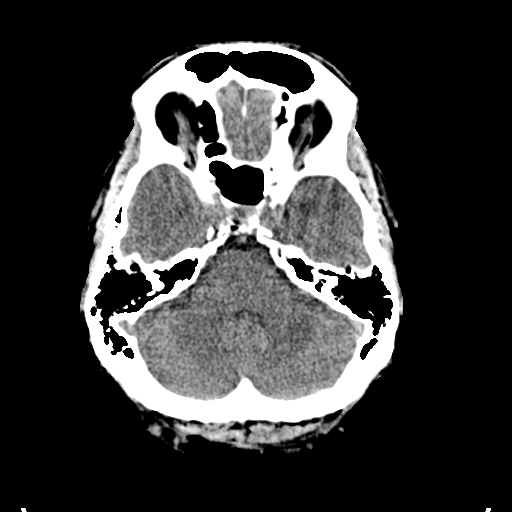
[im 10/36  brain]
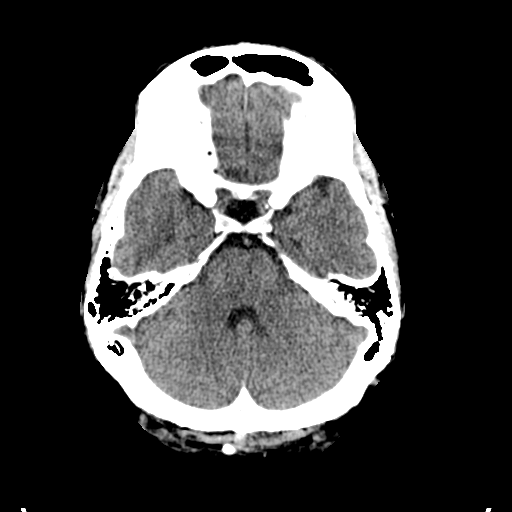
[im 10/36  bone]
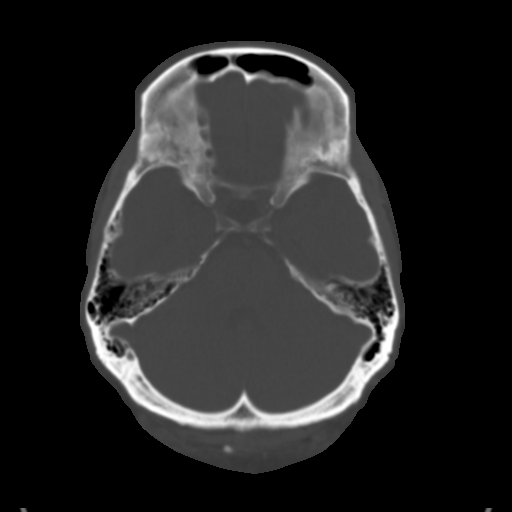
[im 13/36  brain]
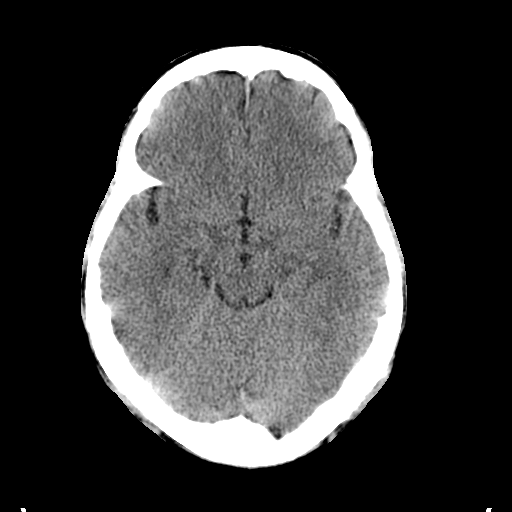
[im 15/36  brain]
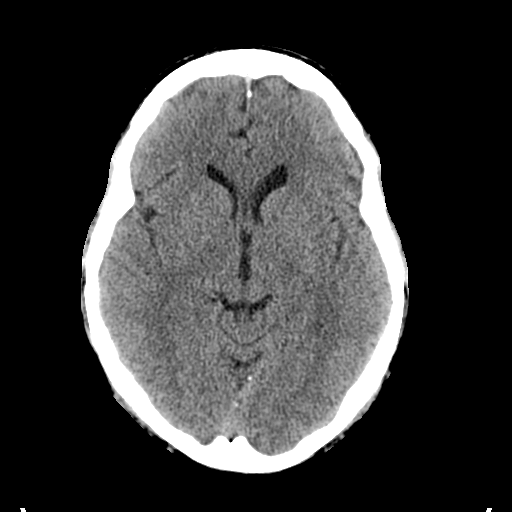
[im 17/36  brain]
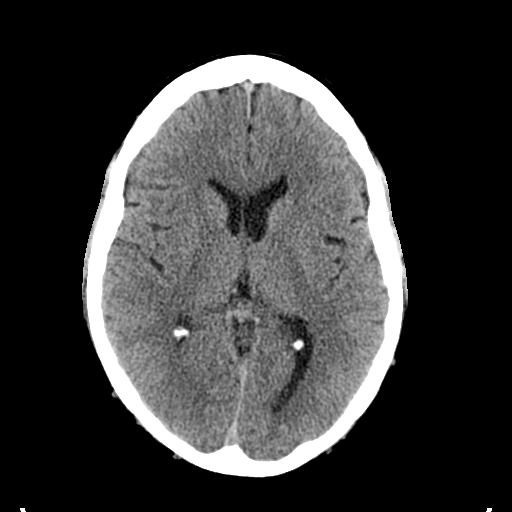
[im 19/36  brain]
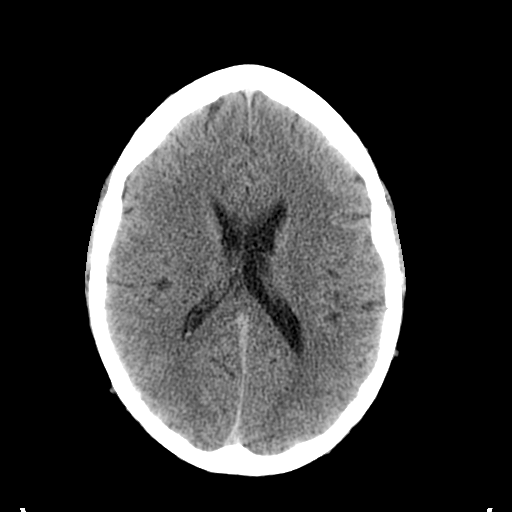
[im 19/36  bone]
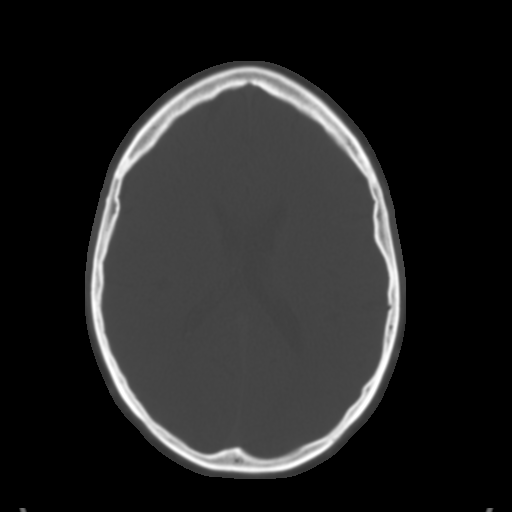
[im 21/36  brain]
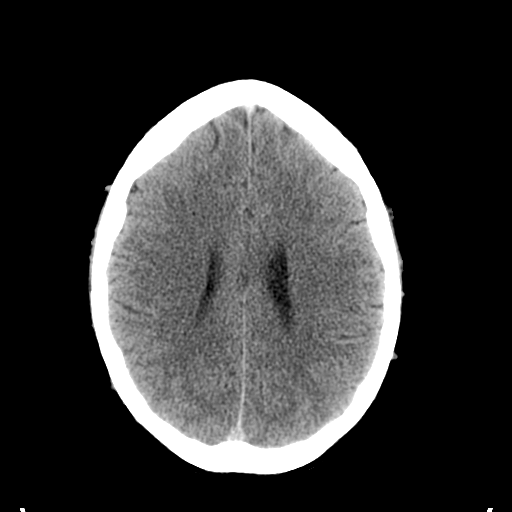
[im 23/36  brain]
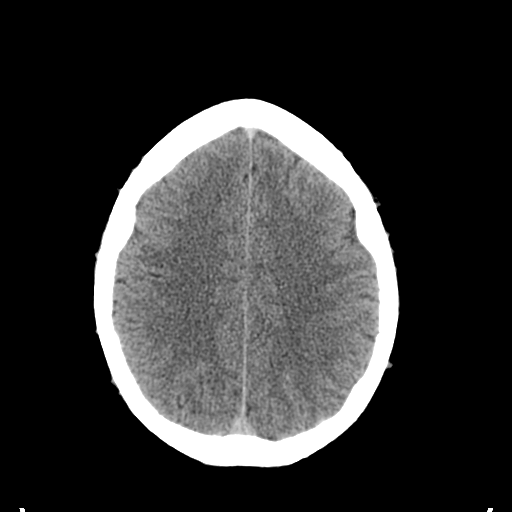
[im 26/36  brain]
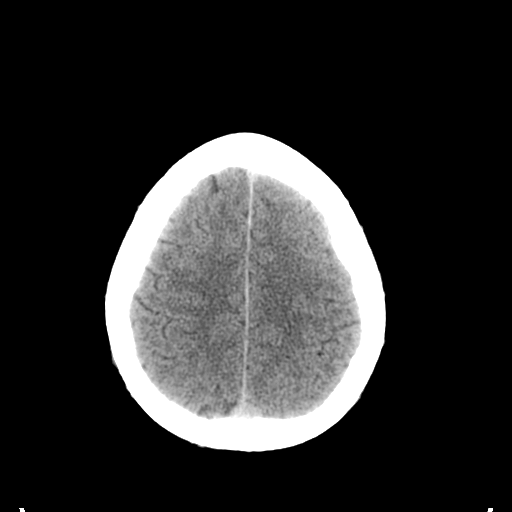
[im 27/36  brain]
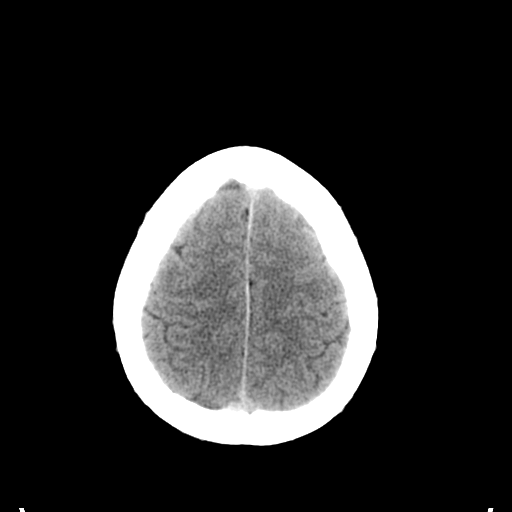
[im 27/36  bone]
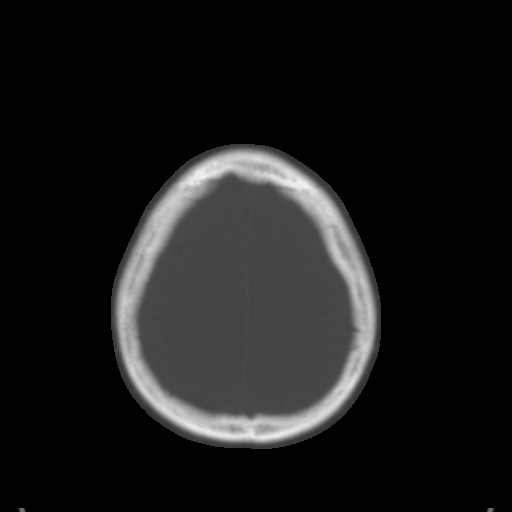
[im 29/36  brain]
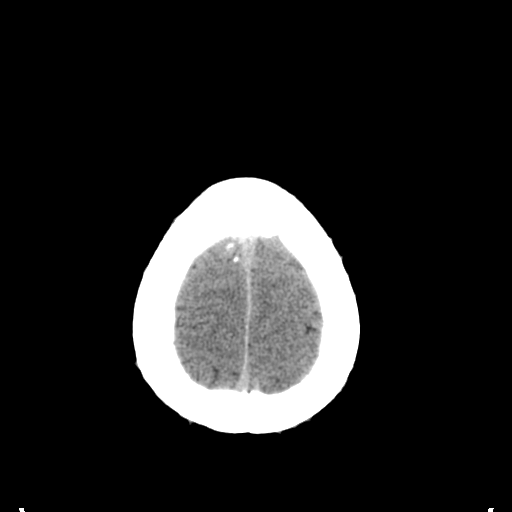
[im 32/36  brain]
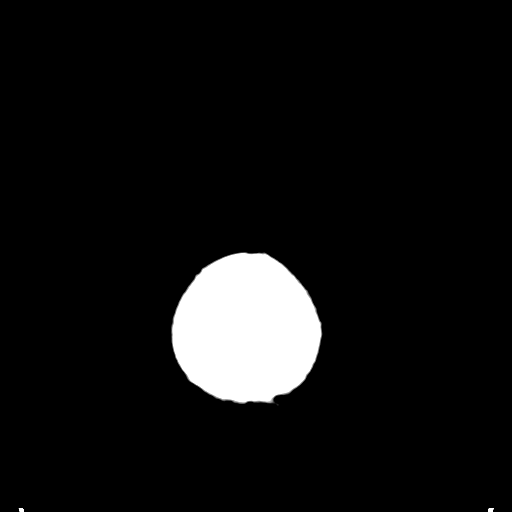
[im 34/36  brain]
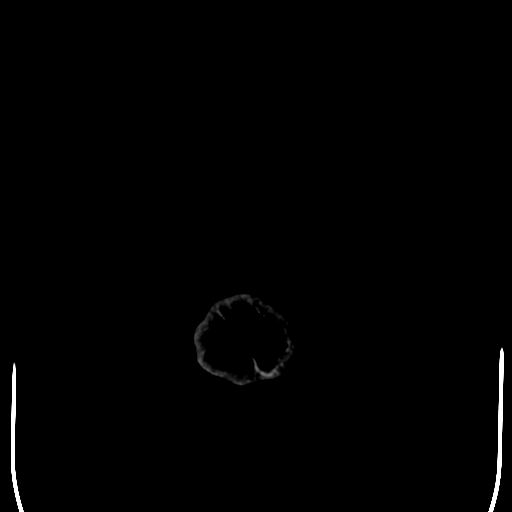

[16 of 30 positions shown; findings below may reference images not displayed]

FINDINGS: Again noted a mucous retention cyst or polyp in left
maxillary sinus.  The mastoid air cells are unremarkable.  No
intracranial hemorrhage, mass effect or midline shift.  No
depressed skull fracture.  Ventricular size is stable from prior
exam.  No intra or extra-axial fluid collection.  The gray and
white matter differentiation is preserved.

No acute infarction.  No mass lesion is noted on this unenhanced
scan.
IMPRESSION: No acute intracranial abnormality.  Again noted a polyp or mucous
retention cyst in left maxillary sinus.

## 2012-04-30 IMAGING — CR DG CHEST 2V
2 series · 2 of 2 positions shown · non-contrast
Comparison: [DATE]

CLINICAL DATA: Hypertension

CHEST - 2 VIEW

[w chest pa]
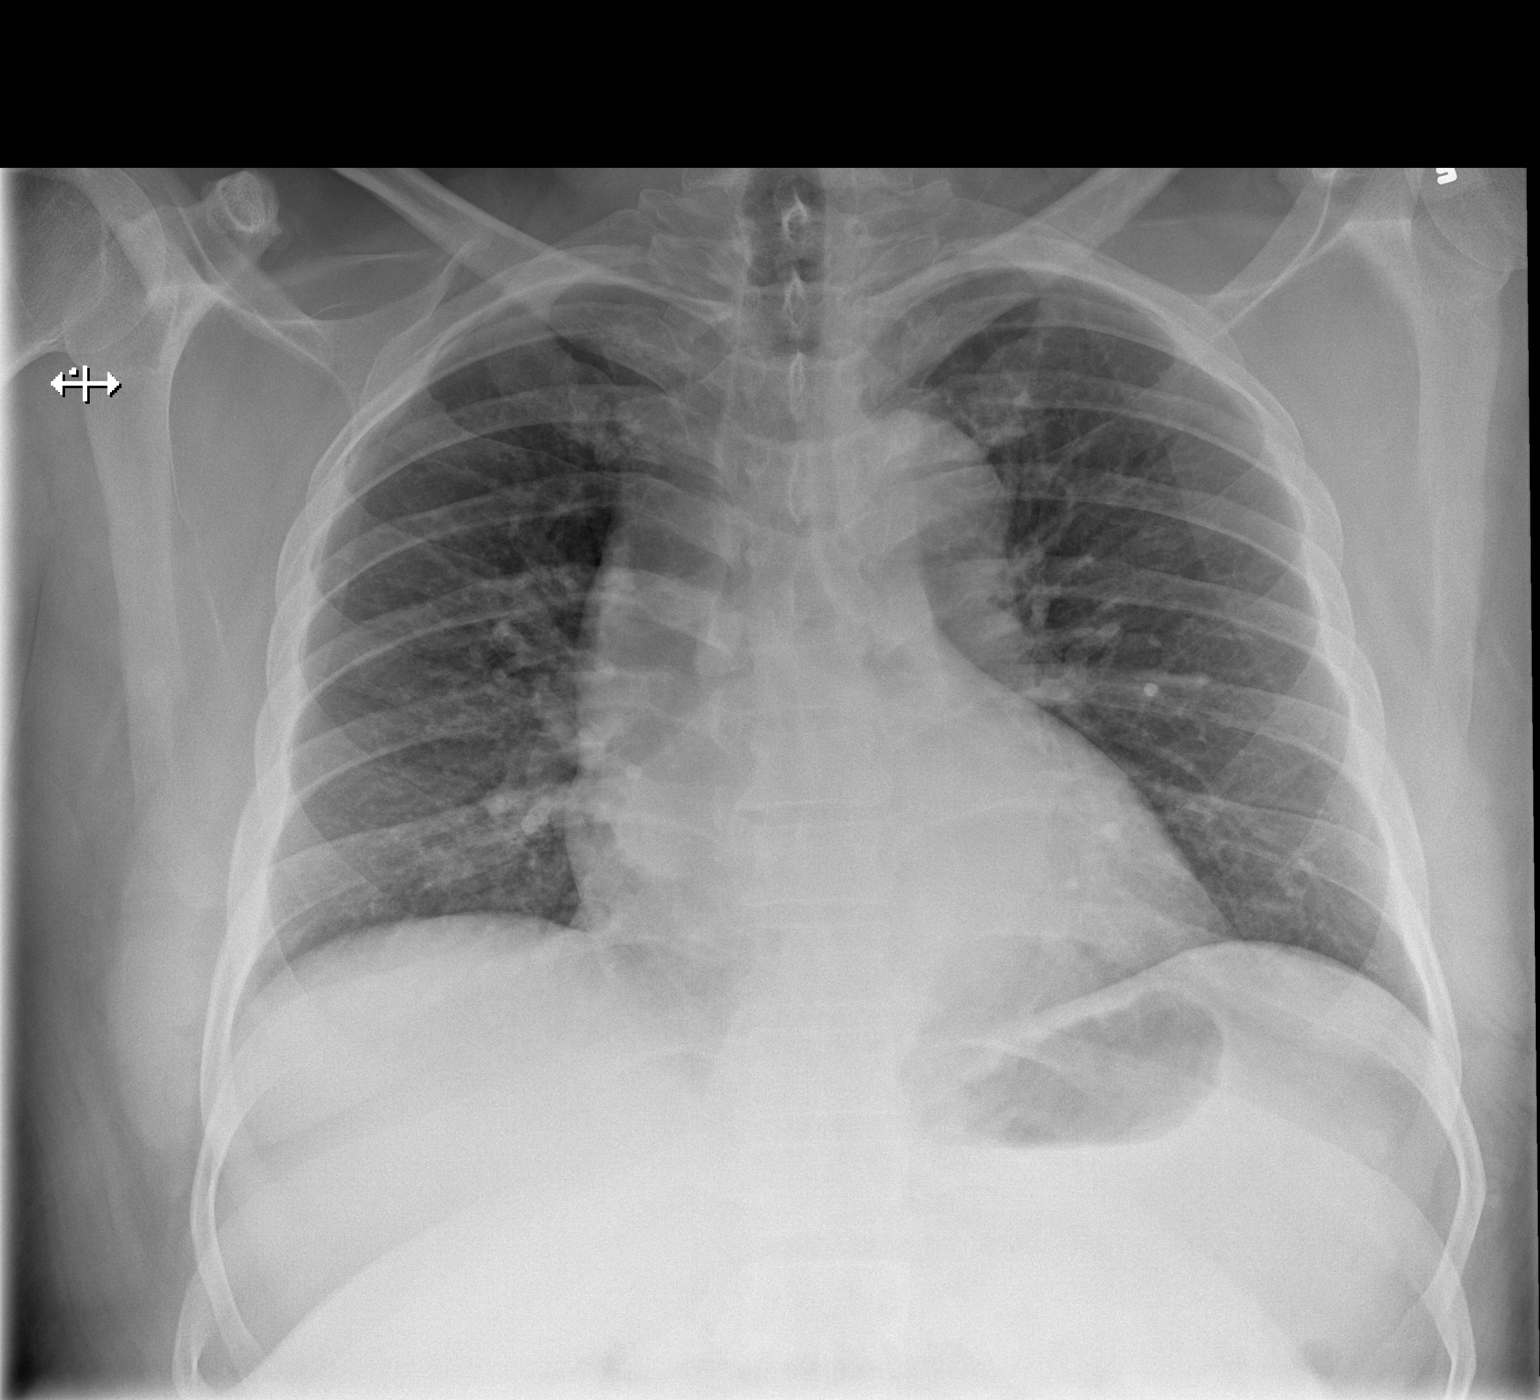

[w chest lat]
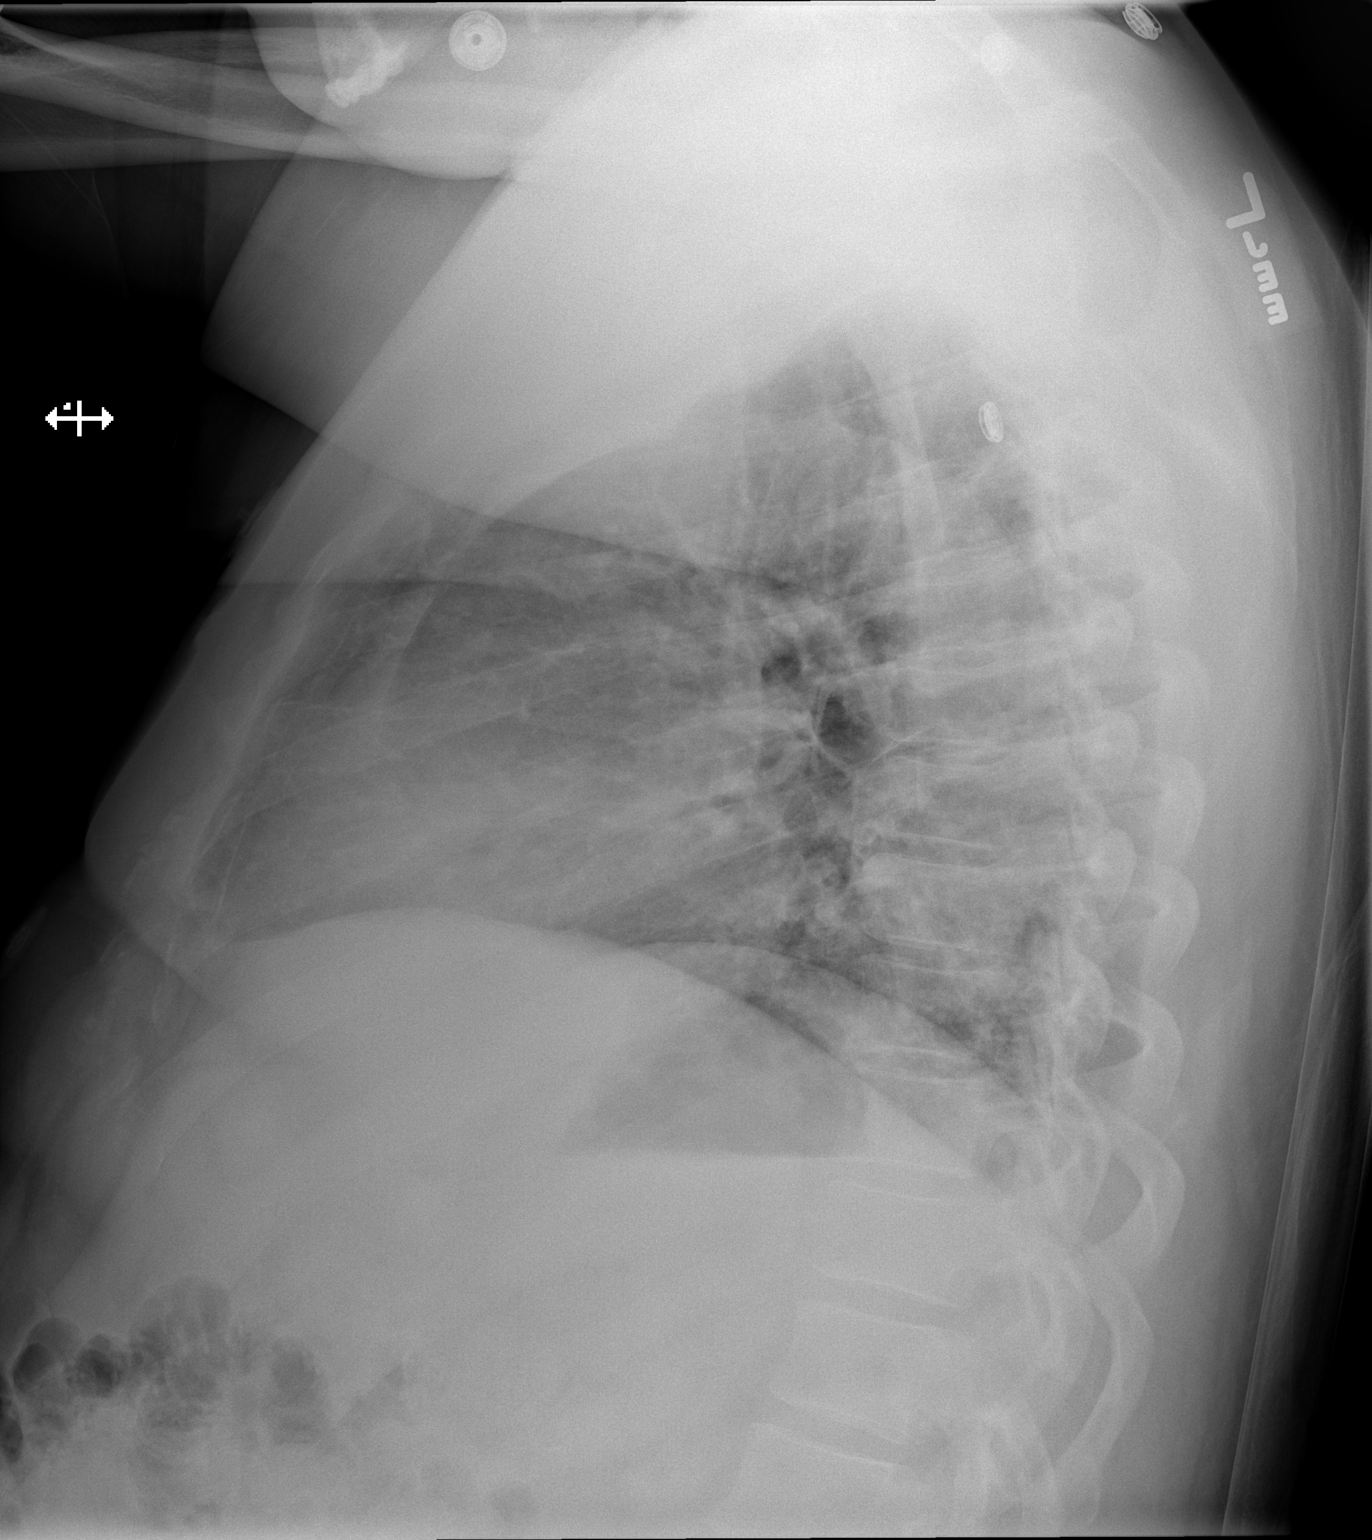

[2 of 2 positions shown; findings below may reference images not displayed]

FINDINGS: Heart size is upper limits of normal.  The thoracic aorta
appears tortuous and somewhat unfolded.

Lungs are clear.  No pleural effusion.
IMPRESSION: 1.  No acute findings.

## 2012-04-30 MED ORDER — PROMETHAZINE HCL 25 MG/ML IJ SOLN
25.0000 mg | Freq: Once | INTRAMUSCULAR | Status: AC
  Administered 2012-04-30: 25 mg via INTRAVENOUS
  Filled 2012-04-30: qty 1

## 2012-04-30 MED ORDER — LISINOPRIL 20 MG PO TABS
20.0000 mg | ORAL_TABLET | ORAL | Status: DC
  Filled 2012-04-30: qty 1

## 2012-04-30 MED ORDER — LABETALOL HCL 100 MG PO TABS
100.0000 mg | ORAL_TABLET | Freq: Three times a day (TID) | ORAL | Status: DC
  Administered 2012-04-30 – 2012-05-02 (×5): 100 mg via ORAL
  Filled 2012-04-30 (×7): qty 1

## 2012-04-30 MED ORDER — HEPARIN SODIUM (PORCINE) 5000 UNIT/ML IJ SOLN
5000.0000 [IU] | Freq: Three times a day (TID) | INTRAMUSCULAR | Status: DC
  Administered 2012-04-30 – 2012-05-02 (×4): 5000 [IU] via SUBCUTANEOUS
  Filled 2012-04-30 (×8): qty 1

## 2012-04-30 MED ORDER — ACETAMINOPHEN 650 MG RE SUPP: 650.0000 mg | Freq: Four times a day (QID) | RECTAL | Status: DC | PRN

## 2012-04-30 MED ORDER — AMLODIPINE BESYLATE 10 MG PO TABS
10.0000 mg | ORAL_TABLET | ORAL | Status: DC
  Filled 2012-04-30: qty 1

## 2012-04-30 MED ORDER — SODIUM CHLORIDE 0.9 % IJ SOLN
3.0000 mL | Freq: Two times a day (BID) | INTRAMUSCULAR | Status: DC
  Administered 2012-04-30 – 2012-05-01 (×3): 3 mL via INTRAVENOUS

## 2012-04-30 MED ORDER — ONDANSETRON HCL 8 MG PO TABS
ORAL_TABLET | ORAL | Status: AC
  Administered 2012-04-30: 14:00:00
  Filled 2012-04-30: qty 1

## 2012-04-30 MED ORDER — ACETAMINOPHEN 325 MG PO TABS
650.0000 mg | ORAL_TABLET | Freq: Four times a day (QID) | ORAL | Status: DC | PRN
  Administered 2012-05-01: 650 mg via ORAL
  Filled 2012-04-30: qty 2

## 2012-04-30 MED ORDER — LABETALOL HCL 5 MG/ML IV SOLN
5.0000 mg | Freq: Once | INTRAVENOUS | Status: AC
  Administered 2012-04-30: 5 mg via INTRAVENOUS
  Filled 2012-04-30: qty 4

## 2012-04-30 MED ORDER — LISINOPRIL 20 MG PO TABS
20.0000 mg | ORAL_TABLET | Freq: Every day | ORAL | Status: DC
  Administered 2012-04-30 – 2012-05-02 (×3): 20 mg via ORAL
  Filled 2012-04-30 (×2): qty 1

## 2012-04-30 MED ORDER — ONDANSETRON HCL 4 MG/2ML IJ SOLN: 4.0000 mg | Freq: Four times a day (QID) | INTRAMUSCULAR | Status: DC | PRN

## 2012-04-30 MED ORDER — ONDANSETRON HCL 4 MG PO TABS: 4.0000 mg | ORAL_TABLET | Freq: Four times a day (QID) | ORAL | Status: DC | PRN

## 2012-04-30 MED ORDER — AMLODIPINE BESYLATE 10 MG PO TABS
10.0000 mg | ORAL_TABLET | Freq: Every day | ORAL | Status: DC
  Administered 2012-04-30 – 2012-05-02 (×3): 10 mg via ORAL
  Filled 2012-04-30 (×2): qty 1

## 2012-04-30 MED ORDER — MORPHINE SULFATE 2 MG/ML IJ SOLN: 1.0000 mg | INTRAMUSCULAR | Status: DC | PRN

## 2012-04-30 NOTE — ED Provider Notes (Signed)
History     CSN: [id number]  Arrival date & time 04/30/12  1313   First MD Initiated Contact with Patient 04/30/12 1514      Chief Complaint  Patient presents with  . Headache    (Consider location/radiation/quality/duration/timing/severity/associated sxs/prior treatment) Patient is a 41 y.o. male presenting with headaches. The history is provided by the patient.  Headache  This is a recurrent problem. Associated symptoms include nausea. Pertinent negatives include no shortness of breath and no vomiting.   patient has had a headache for the last 3 days. He was seen at his primary care Dr. and was sent in for hypertensive urgency. No chest pain. No fevers. He states he has a history of migraines and this feels like that. Light bothers him somewhat. No vomiting. No numbness or weakness. No chest pain. His blood pressure was 190/140. Dr. Perrin Maltese talked with Dr. Myrtis Ser and the patient is supposed to be admitted. No localizing numbness or weakness. No fevers. No swelling.  Past Medical History  Diagnosis Date  . Hypertension   . Migraine   . Depression     Questionable per records. Not on any medication.   . Schizophrenia     Questionable per records. Not on any medication.     History reviewed. No pertinent past surgical history.  Family History  Problem Relation Age of Onset  . Cancer Father     prostate  . Heart disease Father 74    MI   . Cancer Maternal Grandfather   . Cancer Paternal Grandfather   . Diabetes Mother   . Hypertension Mother   . Hypertension Father     History  Substance Use Topics  . Smoking status: Never Smoker   . Smokeless tobacco: Not on file  . Alcohol Use: No      Review of Systems  Constitutional: Negative for activity change and appetite change.  HENT: Negative for neck stiffness.   Eyes: Positive for photophobia. Negative for pain.  Respiratory: Negative for chest tightness and shortness of breath.   Cardiovascular: Negative for chest  pain and leg swelling.  Gastrointestinal: Positive for nausea. Negative for vomiting, abdominal pain and diarrhea.  Genitourinary: Negative for flank pain.  Musculoskeletal: Negative for back pain.  Skin: Negative for rash.  Neurological: Positive for headaches. Negative for weakness and numbness.  Psychiatric/Behavioral: Negative for behavioral problems.    Allergies  Review of patient's allergies indicates no known allergies.  Home Medications   Current Outpatient Rx  Name Route Sig Dispense Refill  . AMLODIPINE BESYLATE 10 MG PO TABS Oral Take 10 mg by mouth at bedtime.    Marland Kitchen HYDROCODONE-ACETAMINOPHEN 5-500 MG PO TABS Oral Take 1 tablet by mouth every 8 (eight) hours as needed for pain. 20 tablet 0  . LISINOPRIL 20 MG PO TABS Oral Take 1 tablet (20 mg total) by mouth daily. 90 tablet 3  . TETRAHYDROZOLINE HCL 0.05 % OP SOLN Ophthalmic Apply 1 drop to eye as needed. For red eyes      BP 156/114  Pulse 69  Temp(Src) 97.9 F (36.6 C) (Oral)  Resp 18  SpO2 99%  Physical Exam  Nursing note and vitals reviewed. Constitutional: He is oriented to person, place, and time. He appears well-developed and well-nourished.  HENT:  Head: Normocephalic and atraumatic.  Eyes: Pupils are equal, round, and reactive to light.  Neck: Normal range of motion. Neck supple.  Cardiovascular: Normal rate, regular rhythm and normal heart sounds.   No murmur heard.  Pulmonary/Chest: Effort normal and breath sounds normal.  Abdominal: Soft. Bowel sounds are normal. He exhibits no distension and no mass. There is no tenderness. There is no rebound and no guarding.  Musculoskeletal: Normal range of motion. He exhibits no edema.  Neurological: He is alert and oriented to person, place, and time. No cranial nerve deficit.  Skin: Skin is warm and dry.  Psychiatric: He has a normal mood and affect.    ED Course  Procedures (including critical care time)  Labs Reviewed  DIFFERENTIAL - Abnormal; Notable  for the following:    Neutrophils Relative 81 (*)    Monocytes Relative 2 (*)    All other components within normal limits  POCT I-STAT, CHEM 8 - Abnormal; Notable for the following:    Glucose, Bld 111 (*)    All other components within normal limits  TROPONIN I  CBC  URINE RAPID DRUG SCREEN (HOSP PERFORMED)   Dg Chest 2 View  04/30/2012  *RADIOLOGY REPORT*  Clinical Data: Hypertension  CHEST - 2 VIEW  Comparison: 04/29/2012  Findings: Heart size is upper limits of normal.  The thoracic aorta appears tortuous and somewhat unfolded.  Lungs are clear.  No pleural effusion.  IMPRESSION:  1.  No acute findings.  Original Report Authenticated By: Rosealee Albee, M.D.   Dg Chest 2 View  04/29/2012  *RADIOLOGY REPORT*  Clinical Data: Severe hypertension.  CHEST - 2 VIEW  Comparison: None.  Findings: Trachea is midline.  Heart size within normal limits. Thoracic aorta appears somewhat uncoiled.  Lungs are clear.  No pleural fluid.  IMPRESSION: No acute findings.  Clinically significant discrepancy from primary report, if provided: None  Original Report Authenticated By: Reyes Ivan, M.D.   Ct Head Wo Contrast  04/30/2012  *RADIOLOGY REPORT*  Clinical Data: Altered mental status, hypertension  CT HEAD WITHOUT CONTRAST  Technique:  Contiguous axial images were obtained from the base of the skull through the vertex without contrast.  Comparison: 04/01/2010  Findings: Again noted a mucous retention cyst or polyp in left maxillary sinus.  The mastoid air cells are unremarkable.  No intracranial hemorrhage, mass effect or midline shift.  No depressed skull fracture.  Ventricular size is stable from prior exam.  No intra or extra-axial fluid collection.  The gray and white matter differentiation is preserved.  No acute infarction.  No mass lesion is noted on this unenhanced scan.  IMPRESSION: No acute intracranial abnormality.  Again noted a polyp or mucous retention cyst in left maxillary sinus.  Original  Report Authenticated By: Natasha Mead, M.D.     1. HTN (hypertension), malignant   2. HA (headache)   3. Migraine headache with aura      Date: 04/30/2012  Rate: 69  Rhythm: normal sinus rhythm  QRS Axis: normal  Intervals: normal  ST/T Wave abnormalities: nonspecific T wave changes  Conduction Disutrbances:none  Narrative Interpretation: T waves are now inverted laterally.  Old EKG Reviewed: changes noted    MDM  [facility] He had high blood pressure there 180/130. Is also had a headache. He states it is typical migraine headache. He was sent in for admission to our cardiology. His blood pressures improved upon arrival here. He continued to have a headache. He was treated with Phenergan for migraine his headache improved. His EKG did show lateral T wave inversion was new since last one in 2012. Patient be admitted cardiology  Juliet Rude. Rubin Payor, MD 04/30/12 1756

## 2012-04-30 NOTE — ED Notes (Signed)
Generalized h/a all over x 3 days.

## 2012-04-30 NOTE — H&P (Addendum)
[facility] Resident Note  Patient ID: Michael [name] MRN: [id number] DOB/AGE: 1971-12-13 41 y.o. Admit date: 04/30/2012  Primary Care Physician: None ( was seen until 2011 at [facility] and then goes to Urgent Care for refills of his medication.  Primary Cardiologist: None   Active Problem: Malignant Hypertension  HPI: 41 y/o Male with PMH significant for Hypertension and Migraine who presented to the ED with Headache and elevated blood pressure. Patient was evaluated  at Urgent care by Dr Perrin Maltese for headache on 04/29/12  and was found to have BP of 170/110.  Patient was given clonidine 0.1 mg po times one restarted on Amlodipine 10 mg  and Lisinopril 20 mg. Patient was again evaluated today for worsening headache, nausea and three episodes of non-bloody emesis. Dr Perrin Maltese  discussed with Dr Myrtis Ser the case who recommended to transfer patient to ER for admission.  During evaluation patient falls asleep frequently and the history was obtained from patient, mother and friend who was present during the evaluation. Patient noted that he occasionally has migraine but this feels different. It is a throbbing pain, located on the forehead area, associated with blurry vision , nausea and episodes of vomiting. He denies any chest pain except after emesis, SOB, syncope.   Of note: Patient was last seen [facility] in 2011 and would otherwise followup in the radiation or to urgent care to refill his prescriptions. Patient has been taking his Lisinopril, amlodipine, clonidine, hydralazine in the past.    Past Medical History  Diagnosis Date  . Hypertension   . Migraine   . Depression     Questionable per records. Not on any medication.   . Schizophrenia     Questionable per records. Not on any medication.     History reviewed. No pertinent past surgical history.  Family History  Problem Relation Age of Onset  . Cancer Father     prostate  . Heart disease Father     History    Social History  . Marital Status: Divorced    Spouse Name: N/A    Number of Children: N/A  . Years of Education: N/A   Occupational History  . Not on file.   Social History Main Topics  . Smoking status: Never Smoker   . Smokeless tobacco: Not on file  . Alcohol Use: No  . Drug Use: No  . Sexually Active: Not on file   Other Topics Concern  . Not on file   Social History Narrative  . No narrative on file      ROS: Bold if positive:  General:  fevers/chills/night sweats Eyes:  blurry vision, diplopia,  amaurosis ENT:  sore throat , hearing loss, extensive snoring Resp: cough, wheezing,  hemoptysis CV: edema , palpitations GI:  abdominal pain, nausea, vomiting, diarrhea,  constipation GU: dysuria, frequency,  hematuria Skin:  rash Neuro: headache, numbness, tingling,  weakness of extremities Musculoskeletal: joint pain or swelling Heme:  Bleeding or easy bruising Endo:  polydipsia , polyuria   Physical Exam: Blood pressure 158/109, pulse 70, temperature 97.9 F (36.6 C), temperature source Oral, resp. rate 16, SpO2 100.00%. Current Weight  04/30/12 280 lb (127.007 kg)  04/29/12 284 lb 6.4 oz (129.003 kg)    General: Vital signs reviewed and noted. Well-developed, well-nourished, in no acute distress; drowsy but cooperative throughout examination.  HEENT: normal Neck: JVP normal. Carotid upstrokes normal without bruits. No thyromegaly. Lungs: Normal respiratory effort. Clear to auscultation BL without  crackles or wheezes. CV: Apex is discrete and nondisplaced, RRR without murmur or gallop Abd: soft, NT, +BS, no bruit, no hepatosplenomegaly Ext: no clubbing, cyanosis, edema         DP/PT pulses intact and equal Skin: warm and dry without rash Neuro: CNII-XII intact             Strength intact equal bilaterally   Labs: CBC:  Basename 04/30/12 1537 04/30/12 1522 04/29/12 1354  WBC -- 6.5 4.7  NEUTROABS -- 5.2 --  HGB 16.0 14.3 --  HCT 47.0 41.6 --  MCV  -- 84.6 86.3  PLT -- 203 --   CMET:  Lab 04/30/12 1537  NA 141  K 4.2  CL 103  CO2 --  BUN 12  CREATININE 0.90  CALCIUM --  PROT --  BILITOT --  ALKPHOS --  ALT --  AST --  GLUCOSE 111*   Cardiac Enzymes:  Basename 04/30/12 1522  CKTOTAL --  CKMB --  CKMBINDEX --  TROPONINI <0.30   Fasting Lipid Panel: In process  Hemoglobin A1C:  Basename 04/29/12 1357  HGBA1C 5.7   Thyroid Function Tests: In process.   Radiology: Dg Chest 2 View  04/29/2012  *RADIOLOGY REPORT*  Clinical Data: Severe hypertension.  CHEST - 2 VIEW  Comparison: None.  Findings: Trachea is midline.  Heart size within normal limits. Thoracic aorta appears somewhat uncoiled.  Lungs are clear.  No pleural fluid.  IMPRESSION: No acute findings.  Clinically significant discrepancy from primary report, if provided: None  Original Report Authenticated By: Reyes Ivan, M.D.   EKG: Normal sinus rhythm. HR 65. Nonspecific changes in the inferior leads.  Last Echo: none Last Cath: none   ASSESSMENT AND PLAN:  41 y/o Male with PMH significant for Hypertension and Migraine who presented to the ED with Headache and elevated blood pressure.  1. Malignant hypertension with blood pressures in the 170s / 110s and altered mental status. Reviewing the chart patient seems to be noncompliant with medication and had not elevated blood pressures in the past. Patient needs better control of his blood pressure especially with significant family history. There is concern for sleep apnea which should be evaluated as an outpatient. Further recommend a 2-D echo to evaluate LV function as an outpatient. At this point will lower her blood pressure is gradually since patient most likely had long-standing uncontrolled hypertension. We'll further risk stratify with lipid panel and TSH which are pending at this point. Hemoglobin A1c was 5.7  which is reassuring.  - We'll admit the patient to telemetry - Labetolol 100 mg 3 times a  day which should not be continued on discharge - Restart home dose lisinopril 20 mg and Norvasc 10 mg. - Will cycle cardiac enzymes - EKG in a.m. - Continuous pulse ox  2. Questionable Depression and schizophrenia : Currently on no  Medication. Recommend further evaluation as an outpatient  3. DVT PPX: Heparin  Patient history and plan of care reviewed with attending, Dr. Antoine Poche  Signed: Almyra Deforest 04/30/2012, 4:53 PM    History reviewed with the patient, no changes to be made.  Patient with difficult to control HTN with noncompliance with medications.  He has been treated for hypertension in urgent care and has had headache and is referred to the ER for this.  Denies chest pain.  No SOB.The patient exam reveals Lungs:  Clear,  COR:  RRR, Abd Positive bowel sounds, no rebound no guarding,  Ext No edema..  All available  labs, radiology testing, previous records reviewed. Agree with documented assessment and plan.  Observe overnight on outpatient meds and po labetalol.  If he needs beta blocker at discharge change to once daily (Zebeta).  Needs out patient sleep study and echo.  Check UA and we can also consider further outpatient studies for secondary causes of HTN.   Fayrene Fearing Avis Tirone  5:41 PM 05/01/11

## 2012-04-30 NOTE — ED Notes (Signed)
Attempted to call report.  RN Shanda Bumps asked if she could take report in 5 minutes.  Norvasc and lisinopril ordered from pharmacy.

## 2012-04-30 NOTE — Progress Notes (Signed)
  Subjective:    Patient ID: Michael [name], male    DOB: Dec 25, 1971, 41 y.o.   MRN: [id number]  HPI Persistent HA and very high blood pressure Took his amlodipine last night and lisinopril today. Used pain meds Was better, ha gone last night, then woke with severe ha today and much worse. No focal NMS loss. Not using NSAIDS as ordered.   Review of Systems     Objective:   Physical Exam IN pain and sleepy on HC for pain               BP 189/136, 181/133   Pulse 69 BP worse, will get consult from cardiology.  Results for orders placed in visit on 04/29/12  POCT CBC      Component Value Range   WBC 4.7  4.6 - 10.2 (K/uL)   Lymph, poc 2.0  0.6 - 3.4    POC LYMPH PERCENT 42.6  10 - 50 (%L)   MID (cbc) 0.2  0 - 0.9    POC MID % 4.7  0 - 12 (%M)   POC Granulocyte 2.5  2 - 6.9    Granulocyte percent 52.7  37 - 80 (%G)   RBC 4.87  4.69 - 6.13 (M/uL)   Hemoglobin 13.9 (*) 14.1 - 18.1 (g/dL)   HCT, POC 11.9 (*) 14.7 - 53.7 (%)   MCV 86.3  80 - 97 (fL)   MCH, POC 28.5  27 - 31.2 (pg)   MCHC 33.1  31.8 - 35.4 (g/dL)   RDW, POC 82.9     Platelet Count, POC 211  142 - 424 (K/uL)   MPV 9.8  0 - 99.8 (fL)  GLUCOSE, POCT (MANUAL RESULT ENTRY)      Component Value Range   POC Glucose 112    POCT GLYCOSYLATED HEMOGLOBIN (HGB A1C)      Component Value Range   Hemoglobin A1C 5.7    POCT URINALYSIS DIPSTICK      Component Value Range   Color, UA yellow     Clarity, UA clear     Glucose, UA negative     Bilirubin, UA negative     Ketones, UA negative     Spec Grav, UA 1.020     Blood, UA negative     pH, UA 7.0     Protein, UA negative     Urobilinogen, UA 0.2     Nitrite, UA negative     Leukocytes, UA Negative            Assessment & Plan:  Spoke with Dr. Myrtis Ser with [facility] Admit to [facility] thru ER to [place] cardiology today

## 2012-04-30 NOTE — ED Notes (Signed)
Ordered dinner tray.  

## 2012-04-30 NOTE — ED Notes (Signed)
FLOW notified that pt cannot go to 3000.

## 2012-04-30 NOTE — ED Notes (Signed)
PT continues to deny pain.  3700 called and stated pt still has not been assigned a bed.

## 2012-04-30 NOTE — ED Notes (Signed)
Attempted to call report.  RN able to take report at this time d/t the fact that she just received a pt from the ED.

## 2012-05-01 LAB — CARDIAC PANEL(CRET KIN+CKTOT+MB+TROPI)
CK, MB: 2.1 ng/mL (ref 0.3–4.0)
Relative Index: 2 (ref 0.0–2.5)
Relative Index: INVALID (ref 0.0–2.5)
Total CK: 110 U/L (ref 7–232)
Total CK: 99 U/L (ref 7–232)
Troponin I: <0.3 ng/mL (ref <0.30)
Troponin I: <0.3 ng/mL (ref <0.30)

## 2012-05-01 LAB — BASIC METABOLIC PANEL
CO2: 27 mEq/L (ref 19–32)
Chloride: 104 mEq/L (ref 96–112)
Creatinine, Ser: 1.11 mg/dL (ref 0.50–1.35)
Potassium: 3.2 mEq/L — ABNORMAL LOW (ref 3.5–5.1)

## 2012-05-01 MED ORDER — POTASSIUM CHLORIDE CRYS ER 20 MEQ PO TBCR
40.0000 meq | EXTENDED_RELEASE_TABLET | Freq: Two times a day (BID) | ORAL | Status: DC
  Administered 2012-05-01 (×2): 40 meq via ORAL
  Filled 2012-05-01 (×4): qty 2

## 2012-05-01 MED ORDER — POTASSIUM CHLORIDE 20 MEQ PO PACK
40.0000 meq | PACK | Freq: Two times a day (BID) | ORAL | Status: DC
  Filled 2012-05-01 (×2): qty 2

## 2012-05-01 MED ORDER — MEPERIDINE HCL 50 MG PO TABS: 50.0000 mg | ORAL_TABLET | ORAL | Status: DC | PRN

## 2012-05-01 MED ORDER — HYDROCODONE-ACETAMINOPHEN 5-325 MG PO TABS
1.0000 | ORAL_TABLET | ORAL | Status: DC | PRN
  Administered 2012-05-01: 2 via ORAL
  Filled 2012-05-01: qty 2

## 2012-05-01 NOTE — Progress Notes (Signed)
Patient ID: Michael [name], male   DOB: 08/23/1971, 41 y.o.   MRN: [id number]   Patient Name: Michael [name] Date of Encounter: 05/01/2012    SUBJECTIVE  BP now under control. Has persistent Migraine. Has Medicaid but does not MD on regular basis, hence ran out of his meds. Poor understanding.  CURRENT MEDS    . amLODipine  10 mg Oral Daily  . heparin  5,000 Units Subcutaneous Q8H  . labetalol  100 mg Oral TID  . labetalol  5 mg Intravenous Once  . lisinopril  20 mg Oral Daily  . ondansetron      . promethazine  25 mg Intravenous Once  . sodium chloride  3 mL Intravenous Q12H  . DISCONTD: amLODipine  10 mg Oral To ER  . DISCONTD: lisinopril  20 mg Oral To ER    OBJECTIVE  Filed Vitals:   04/30/12 2150 04/30/12 2156 05/01/12 0500 05/01/12 1001  BP: 147/90  143/78 132/79  Pulse: 83  85   Temp: 98.5 F (36.9 C)  98.6 F (37 C)   TempSrc: Oral  Oral   Resp: 20  20   Height: 5\' 9"  (1.753 m)     Weight: 283 lb (128.368 kg)     SpO2: 97% 97% 97%     Intake/Output Summary (Last 24 hours) at 05/01/12 1008 Last data filed at 05/01/12 0800  Gross per 24 hour  Intake    480 ml  Output      0 ml  Net    480 ml   Filed Weights   04/30/12 2150  Weight: 283 lb (128.368 kg)    PHYSICAL EXAM  General: Pleasant, NAD, obese Neuro: Alert and oriented X 3. Moves all extremities spontaneously. Psych: Normal affect. HEENT:  Normal  Neck: Supple without bruits or JVD. Lungs:  Resp regular and unlabored, CTA. Heart: RRR no s3, s4, or murmurs. Abdomen: Soft, non-tender, non-distended, BS + x 4.  Extremities: No clubbing, cyanosis or edema. DP/PT/Radials 2+ and equal bilaterally.  Accessory Clinical Findings  CBC  Basename 04/30/12 1537 04/30/12 1522 04/29/12 1354  WBC -- 6.5 4.7  NEUTROABS -- 5.2 --  HGB 16.0 14.3 --  HCT 47.0 41.6 --  MCV -- 84.6 86.3  PLT -- 203 --   Basic Metabolic Panel  Basename 05/01/12 0610 04/30/12 1537 04/29/12 1349  NA 140 141 --  K 3.2* 4.2 --   CL 104 103 --  CO2 27 -- 33*  GLUCOSE 106* 111* --  BUN 16 12 --  CREATININE 1.11 0.90 --  CALCIUM 9.0 -- 9.2  MG -- -- --  PHOS -- -- --   Liver Function Tests  Basename 04/29/12 1349  AST 15  ALT 15  ALKPHOS 66  BILITOT 0.5  PROT 7.2  ALBUMIN 4.2   No results found for this basename: LIPASE:2,AMYLASE:2 in the last 72 hours Cardiac Enzymes  Basename 05/01/12 0211 04/30/12 1806 04/30/12 1522  CKTOTAL 110 124 --  CKMB 2.2 2.6 --  CKMBINDEX -- -- --  TROPONINI <0.30 <0.30 <0.30   BNP No components found with this basename: POCBNP:3 D-Dimer No results found for this basename: DDIMER:2 in the last 72 hours Hemoglobin A1C  Basename 04/29/12 1357  HGBA1C 5.7   Fasting Lipid Panel  Basename 04/29/12 1349  CHOL 148  HDL 38*  LDLCALC 98  TRIG 61  CHOLHDL 3.9  LDLDIRECT --   Thyroid Function Tests  Basename 04/29/12 1349  TSH 0.356  T4TOTAL --  T3FREE --  THYROIDAB --    TELE  NSR  ECG    Radiology/Studies  Dg Chest 2 View  04/30/2012  *RADIOLOGY REPORT*  Clinical Data: Hypertension  CHEST - 2 VIEW  Comparison: 04/29/2012  Findings: Heart size is upper limits of normal.  The thoracic aorta appears tortuous and somewhat unfolded.  Lungs are clear.  No pleural effusion.  IMPRESSION:  1.  No acute findings.  Original Report Authenticated By: Rosealee Albee, M.D.   Dg Chest 2 View  04/29/2012  *RADIOLOGY REPORT*  Clinical Data: Severe hypertension.  CHEST - 2 VIEW  Comparison: None.  Findings: Trachea is midline.  Heart size within normal limits. Thoracic aorta appears somewhat uncoiled.  Lungs are clear.  No pleural fluid.  IMPRESSION: No acute findings.  Clinically significant discrepancy from primary report, if provided: None  Original Report Authenticated By: Reyes Ivan, M.D.   Ct Head Wo Contrast  04/30/2012  *RADIOLOGY REPORT*  Clinical Data: Altered mental status, hypertension  CT HEAD WITHOUT CONTRAST  Technique:  Contiguous axial images were  obtained from the base of the skull through the vertex without contrast.  Comparison: 04/01/2010  Findings: Again noted a mucous retention cyst or polyp in left maxillary sinus.  The mastoid air cells are unremarkable.  No intracranial hemorrhage, mass effect or midline shift.  No depressed skull fracture.  Ventricular size is stable from prior exam.  No intra or extra-axial fluid collection.  The gray and white matter differentiation is preserved.  No acute infarction.  No mass lesion is noted on this unenhanced scan.  IMPRESSION: No acute intracranial abnormality.  Again noted a polyp or mucous retention cyst in left maxillary sinus.  Original Report Authenticated By: Natasha Mead, M.D.    ASSESSMENT AND PLAN  Principal Problem:  *HTN (hypertension), malignant Active Problems:  Migraine headache with aura    BP now under control. Persistent migraine. K is low.  Signed, Valera Castle MD

## 2012-05-02 ENCOUNTER — Encounter (HOSPITAL_COMMUNITY): Payer: Self-pay | Admitting: Nurse Practitioner

## 2012-05-02 DIAGNOSIS — I10 Essential (primary) hypertension: Secondary | ICD-10-CM | POA: Insufficient documentation

## 2012-05-02 DIAGNOSIS — Z9119 Patient's noncompliance with other medical treatment and regimen: Secondary | ICD-10-CM | POA: Insufficient documentation

## 2012-05-02 LAB — BASIC METABOLIC PANEL
CO2: 27 mEq/L (ref 19–32)
Calcium: 8.8 mg/dL (ref 8.4–10.5)
GFR calc Af Amer: >90 mL/min (ref >90)
GFR calc non Af Amer: >90 mL/min (ref >90)
Sodium: 138 mEq/L (ref 135–145)

## 2012-05-02 MED ORDER — BISOPROLOL FUMARATE 10 MG PO TABS: 10.0000 mg | ORAL_TABLET | Freq: Every day | ORAL | Status: DC

## 2012-05-02 MED ORDER — POTASSIUM CHLORIDE 20 MEQ PO PACK
40.0000 meq | PACK | Freq: Once | ORAL | Status: DC
  Filled 2012-05-02: qty 2

## 2012-05-02 MED ORDER — LISINOPRIL 20 MG PO TABS: 20.0000 mg | ORAL_TABLET | Freq: Every day | ORAL | Status: DC

## 2012-05-02 MED ORDER — AMLODIPINE BESYLATE 10 MG PO TABS: 10.0000 mg | ORAL_TABLET | Freq: Every day | ORAL | Status: DC

## 2012-05-02 MED ORDER — POTASSIUM CHLORIDE CRYS ER 20 MEQ PO TBCR
40.0000 meq | EXTENDED_RELEASE_TABLET | Freq: Once | ORAL | Status: AC
  Administered 2012-05-02: 40 meq via ORAL

## 2012-05-02 NOTE — Discharge Summary (Signed)
Patient ID: Michael [name],  MRN: [id number], DOB/AGE: Feb 10, 1971 41 y.o.  Admit date: 04/30/2012 Discharge date: 05/02/2012  Primary Care Provider: Patrina Levering, MD Primary Cardiologist: Shela Commons. Hochrein, MD  Discharge Diagnoses Principal Problem:  *HTN (hypertension), malignant Active Problems:  Migraine headache with aura  Noncompliance  Hypokalemia requiring supplementation  Allergies No Known Allergies  Procedures  CT Head w/o Contrast 04/30/2012  IMPRESSION: No acute intracranial abnormality. Again noted a polyp or mucous retention cyst in left maxillary sinus. _____________  History of Present Illness  41 y/o male with the above problem list.  He has been noncompliant with his home BP medications.  On the day of admission, he presented to urgent care with complaint of a migraine headache and he was markedly hypertensive.  He was referred to the ER for cardiology evaluation and BP mgmt.  A head CT was performed in the ED and was negative for acute process.  Hils BP was in the 170's/110's and he was admitted for further evaluation.  Hospital Course  Home doses of lisinopril (20mg ) and amlodipine (10mg ) were resumed and beta-blocker therapy was added.  With reinstitution of antihypertensives, he had modest BP improvement and improvement in headache.  He has been hypokalemic and this has been supplemented.  He has been counseled on the importance of medication compliance and we've transitioned his beta blocker to a once daily formulation in hopes of improving compliance.  He will be discharged home today in good condition.  We will arrange for outpt echocardiogram and cardiology f/u.  We also recommend outpatient sleep evaluation @ some point.  Discharge Vitals Blood pressure 142/91, pulse 72, temperature 98.1 F (36.7 C), temperature source Oral, resp. rate 18, height 5\' 9"  (1.753 m), weight 283 lb (128.368 kg), SpO2 98.00%.  Filed Weights   04/30/12 2150  Weight: 283 lb (128.368 kg)    Labs  CBC  Basename 04/30/12 1537 04/30/12 1522 04/29/12 1354  WBC -- 6.5 4.7  NEUTROABS -- 5.2 --  HGB 16.0 14.3 --  HCT 47.0 41.6 --  MCV -- 84.6 86.3  PLT -- 203 --   Basic Metabolic Panel  Basename 05/02/12 0505 05/01/12 0610  NA 138 140  K 3.4* 3.2*  CL 102 104  CO2 27 27  GLUCOSE 99 106*  BUN 13 16  CREATININE 0.96 1.11  CALCIUM 8.8 9.0  MG -- --  PHOS -- --   Liver Function Tests  Basename 04/29/12 1349  AST 15  ALT 15  ALKPHOS 66  BILITOT 0.5  PROT 7.2  ALBUMIN 4.2   Cardiac Enzymes  Basename 05/01/12 0932 05/01/12 0211 04/30/12 1806  CKTOTAL 99 110 124  CKMB 2.1 2.2 2.6  CKMBINDEX -- -- --  TROPONINI <0.30 <0.30 <0.30   Hemoglobin A1C  Basename 04/29/12 1357  HGBA1C 5.7   Fasting Lipid Panel  Basename 04/29/12 1349  CHOL 148  HDL 38*  LDLCALC 98  TRIG 61  CHOLHDL 3.9  LDLDIRECT --   Thyroid Function Tests  Basename 04/29/12 1349  TSH 0.356  T4TOTAL --  T3FREE --  THYROIDAB --   Disposition  Pt is being discharged home today in good condition.  Follow-up Plans & Appointments  Follow-up Information    Follow up with GUEST, Loretha Stapler, MD. (This week)    Contact information:   [address] [place] [address] [phone]       Follow up with Rollene Rotunda, MD. (we will arrange for outpatient follow-up and cardiac ultrasound (echocardiogram))  Contact information:   [address], [address] [place] [address] [phone]        Discharge Medications  Medication List  As of 05/02/2012 10:15 AM   TAKE these medications         amLODipine 10 MG tablet   Commonly known as: NORVASC   Take 1 tablet (10 mg total) by mouth at bedtime.      bisoprolol 10 MG tablet   Commonly known as: ZEBETA   Take 1 tablet (10 mg total) by mouth daily.      HYDROcodone-acetaminophen 5-500 MG per tablet   Commonly known as: VICODIN   Take 1 tablet by mouth every 8  (eight) hours as needed for pain.      lisinopril 20 MG tablet   Commonly known as: PRINIVIL,ZESTRIL   Take 1 tablet (20 mg total) by mouth daily.      VISINE 0.05 % ophthalmic solution   Generic drug: tetrahydrozoline   Apply 1 drop to eye as needed. For red eyes          Outstanding Labs/Studies  Consider outpt sleep study  Duration of Discharge Encounter   Greater than 30 minutes including physician time.  Signed, Nicolasa Ducking NP 05/02/2012, 10:15 AM   Jesse Sans. Daleen Squibb, MD, [facility] Pager:  [phone]

## 2012-05-02 NOTE — Discharge Instructions (Signed)
***  PLEASE REMEMBER TO BRING ALL OF YOUR MEDICATIONS TO EACH OF YOUR FOLLOW-UP OFFICE VISITS.  

## 2012-05-02 NOTE — Progress Notes (Signed)
Patient ID: Michael [name], male   DOB: 29-Jul-1971, 41 y.o.   MRN: [id number]   Patient Name: Michael [name] Date of Encounter: 05/02/2012    SUBJECTIVE  Is feeling much better this morning. Migraine has resolved. Blood pressure under good control. Potassium still borderline low. He wants to go home.  CURRENT MEDS    . amLODipine  10 mg Oral Daily  . heparin  5,000 Units Subcutaneous Q8H  . labetalol  100 mg Oral TID  . lisinopril  20 mg Oral Daily  . potassium chloride  40 mEq Oral BID  . sodium chloride  3 mL Intravenous Q12H  . DISCONTD: potassium chloride  40 mEq Oral BID    OBJECTIVE  Filed Vitals:   05/01/12 1300 05/01/12 1630 05/01/12 2100 05/02/12 0500  BP: 120/79 130/84 155/92 142/91  Pulse: 80 69 68 72  Temp: 98.1 F (36.7 C)  98.3 F (36.8 C) 98.1 F (36.7 C)  TempSrc: Oral  Oral Oral  Resp: 22  18 18   Height:      Weight:      SpO2:   98% 98%    Intake/Output Summary (Last 24 hours) at 05/02/12 0850 Last data filed at 05/01/12 1300  Gross per 24 hour  Intake    240 ml  Output      4 ml  Net    236 ml   Filed Weights   04/30/12 2150  Weight: 283 lb (128.368 kg)    PHYSICAL EXAM  General: Pleasant, NAD. Neuro: Alert and oriented X 3. Moves all extremities spontaneously. Psych: Normal affect. HEENT:  Normal  Neck: Supple without bruits or JVD. Lungs:  Resp regular and unlabored, CTA. Heart: RRR no s3, s4, or murmurs. Abdomen: Soft, non-tender, non-distended, BS + x 4.  Extremities: No clubbing, cyanosis or edema. DP/PT/Radials 2+ and equal bilaterally.  Accessory Clinical Findings  CBC  Basename 04/30/12 1537 04/30/12 1522 04/29/12 1354  WBC -- 6.5 4.7  NEUTROABS -- 5.2 --  HGB 16.0 14.3 --  HCT 47.0 41.6 --  MCV -- 84.6 86.3  PLT -- 203 --   Basic Metabolic Panel  Basename 05/02/12 0505 05/01/12 0610  NA 138 140  K 3.4* 3.2*  CL 102 104  CO2 27 27  GLUCOSE 99 106*  BUN 13 16  CREATININE 0.96 1.11  CALCIUM 8.8 9.0  MG -- --  PHOS  -- --   Liver Function Tests  Basename 04/29/12 1349  AST 15  ALT 15  ALKPHOS 66  BILITOT 0.5  PROT 7.2  ALBUMIN 4.2   No results found for this basename: LIPASE:2,AMYLASE:2 in the last 72 hours Cardiac Enzymes  Basename 05/01/12 0932 05/01/12 0211 04/30/12 1806  CKTOTAL 99 110 124  CKMB 2.1 2.2 2.6  CKMBINDEX -- -- --  TROPONINI <0.30 <0.30 <0.30   BNP No components found with this basename: POCBNP:3 D-Dimer No results found for this basename: DDIMER:2 in the last 72 hours Hemoglobin A1C  Basename 04/29/12 1357  HGBA1C 5.7   Fasting Lipid Panel  Basename 04/29/12 1349  CHOL 148  HDL 38*  LDLCALC 98  TRIG 61  CHOLHDL 3.9  LDLDIRECT --   Thyroid Function Tests  Basename 04/29/12 1349  TSH 0.356  T4TOTAL --  T3FREE --  THYROIDAB --    TELE  Normal sinus rhythm  ECG   Radiology/Studies  Dg Chest 2 View  04/30/2012  *RADIOLOGY REPORT*  Clinical Data: Hypertension  CHEST - 2 VIEW  Comparison: 04/29/2012  Findings: Heart size is upper limits of normal.  The thoracic aorta appears tortuous and somewhat unfolded.  Lungs are clear.  No pleural effusion.  IMPRESSION:  1.  No acute findings.  Original Report Authenticated By: Rosealee Albee, M.D.   Dg Chest 2 View  04/29/2012  *RADIOLOGY REPORT*  Clinical Data: Severe hypertension.  CHEST - 2 VIEW  Comparison: None.  Findings: Trachea is midline.  Heart size within normal limits. Thoracic aorta appears somewhat uncoiled.  Lungs are clear.  No pleural fluid.  IMPRESSION: No acute findings.  Clinically significant discrepancy from primary report, if provided: None  Original Report Authenticated By: Reyes Ivan, M.D.   Ct Head Wo Contrast  04/30/2012  *RADIOLOGY REPORT*  Clinical Data: Altered mental status, hypertension  CT HEAD WITHOUT CONTRAST  Technique:  Contiguous axial images were obtained from the base of the skull through the vertex without contrast.  Comparison: 04/01/2010  Findings: Again noted a  mucous retention cyst or polyp in left maxillary sinus.  The mastoid air cells are unremarkable.  No intracranial hemorrhage, mass effect or midline shift.  No depressed skull fracture.  Ventricular size is stable from prior exam.  No intra or extra-axial fluid collection.  The gray and white matter differentiation is preserved.  No acute infarction.  No mass lesion is noted on this unenhanced scan.  IMPRESSION: No acute intracranial abnormality.  Again noted a polyp or mucous retention cyst in left maxillary sinus.  Original Report Authenticated By: Natasha Mead, M.D.    ASSESSMENT AND PLAN  Principal Problem:  *HTN (hypertension), malignant Active Problems:  Migraine headache with aura    Blood pressure under much better control. We'll supplement potassium further today before discharge. Reviewed salt restriction in staying away from fast food for weight loss. Also reviewed potassium rich foods. We'll give 3 months of refills to avoid readmission. He will call urgent medical and family care for appointment. No further treatment for migraine. I  Signed, Valera Castle MD

## 2012-05-05 NOTE — Progress Notes (Signed)
Retro-Utilization review completed  

## 2012-10-23 ENCOUNTER — Ambulatory Visit (INDEPENDENT_AMBULATORY_CARE_PROVIDER_SITE_OTHER): Payer: Medicare Other | Admitting: Family Medicine

## 2012-10-23 VITALS — BP 162/114 | HR 62 | Temp 97.4°F | Resp 18 | Ht 70.0 in | Wt 291.0 lb

## 2012-10-23 DIAGNOSIS — Z23 Encounter for immunization: Secondary | ICD-10-CM

## 2012-10-23 DIAGNOSIS — I10 Essential (primary) hypertension: Secondary | ICD-10-CM

## 2012-10-23 DIAGNOSIS — G43109 Migraine with aura, not intractable, without status migrainosus: Secondary | ICD-10-CM

## 2012-10-23 LAB — COMPREHENSIVE METABOLIC PANEL
AST: 15 U/L (ref 0–37)
Albumin: 4 g/dL (ref 3.5–5.2)
BUN: 15 mg/dL (ref 6–23)
Calcium: 9 mg/dL (ref 8.4–10.5)
Chloride: 104 mEq/L (ref 96–112)
Creat: 1.12 mg/dL (ref 0.50–1.35)
Glucose, Bld: 87 mg/dL (ref 70–99)
Potassium: 4.1 mEq/L (ref 3.5–5.3)

## 2012-10-23 LAB — POCT UA - MICROSCOPIC ONLY
Bacteria, U Microscopic: NEGATIVE
Casts, Ur, LPF, POC: NEGATIVE
Crystals, Ur, HPF, POC: NEGATIVE
Mucus, UA: NEGATIVE
RBC, urine, microscopic: NEGATIVE

## 2012-10-23 LAB — POCT URINALYSIS DIPSTICK
Bilirubin, UA: NEGATIVE
Blood, UA: NEGATIVE
Glucose, UA: NEGATIVE
Ketones, UA: NEGATIVE
Leukocytes, UA: NEGATIVE
pH, UA: 6

## 2012-10-23 LAB — POCT CBC
HCT, POC: 43.2 % — AB (ref 43.5–53.7)
Hemoglobin: 13.5 g/dL — AB (ref 14.1–18.1)
Lymph, poc: 2.8 (ref 0.6–3.4)
MCH, POC: 28.1 pg (ref 27–31.2)
MCHC: 31.3 g/dL — AB (ref 31.8–35.4)
MCV: 89.8 fL (ref 80–97)
RBC: 4.81 M/uL (ref 4.69–6.13)
WBC: 6.1 10*3/uL (ref 4.6–10.2)

## 2012-10-23 MED ORDER — BISOPROLOL FUMARATE 10 MG PO TABS: 10.0000 mg | ORAL_TABLET | Freq: Every day | ORAL | Status: DC

## 2012-10-23 MED ORDER — LISINOPRIL 20 MG PO TABS: 20.0000 mg | ORAL_TABLET | Freq: Every day | ORAL | Status: DC

## 2012-10-23 MED ORDER — AMLODIPINE BESYLATE 10 MG PO TABS: ORAL_TABLET | ORAL | Status: DC

## 2012-10-23 NOTE — Patient Instructions (Addendum)
1. Essential hypertension, benign  POCT CBC, POCT urinalysis dipstick, POCT UA - Microscopic Only, Comprehensive metabolic panel, EKG 12-Lead, amLODipine (NORVASC) 10 MG tablet, bisoprolol (ZEBETA) 10 MG tablet, lisinopril (PRINIVIL,ZESTRIL) 20 MG tablet  2. HTN (hypertension), malignant    3. HA (headache)    4. Migraine headache with aura       PLEASE START AMLODIPINE 10MG  --- START 1/2 TABLET DAILY FOR ONE WEEK THEN INCREASE TO 1 TABLET DAILY.

## 2012-10-23 NOTE — Progress Notes (Signed)
[address]   [place], [place]  [address]   [phone]  Subjective:    Patient ID: Michael [name], male    DOB: 08-21-1971, 41 y.o.   MRN: [id number]  HPIThis 41 y.o. male presents for evaluation of the following:  1.  HTN: six month follow-up for elevated blood pressure.  Taking Lisinopril 20mg  daily and Bisoprolol 10mg  daily; not taking Amlodipine 10mg  daily; not sure why not taking Amlodipine.  Thinks ran out of Amlodipine.  +chest pain L sided non-exertional; no shortness of breath, palpitations, leg swelling.  No headaches.  +Blurred vision; no dizziness; no paresthesias; +weakness at times from over-exertion.  Checked BP this week; not sure of readings.  Nurse came yesterday and BP 164/119 pulse 80 and RR 16.  Fasting.    2. Immunizations: requesting flu vaccine and TDAP.     Review of Systems  Constitutional: Negative for fever, chills, diaphoresis and fatigue.  Cardiovascular: Positive for chest pain. Negative for palpitations and leg swelling.  Gastrointestinal: Positive for constipation. Negative for nausea, vomiting, abdominal pain and diarrhea.  Neurological: Negative for dizziness, tremors, seizures, syncope, facial asymmetry, speech difficulty, weakness, light-headedness, numbness and headaches.       Past Medical History  Diagnosis Date  . Hypertension   . Migraine   . Depression     Questionable per records. Not on any medication.   . Schizophrenia     Questionable per records. Not on any medication.   . Noncompliance     Past Surgical History  Procedure Date  . Mandible surgery     metal plate    Prior to Admission medications   Medication Sig Start Date End Date Taking? Authorizing Provider  bisoprolol (ZEBETA) 10 MG tablet Take 1 tablet (10 mg total) by mouth daily. 05/02/12 05/02/13 Yes Ok Anis, NP  lisinopril (PRINIVIL,ZESTRIL) 20 MG tablet Take 1 tablet (20 mg total) by mouth daily. 05/02/12 05/02/13 Yes Ok Anis, NP  amLODipine  (NORVASC) 10 MG tablet Take 1 tablet (10 mg total) by mouth at bedtime. 05/02/12 05/02/13  Ok Anis, NP  tetrahydrozoline (VISINE) 0.05 % ophthalmic solution Apply 1 drop to eye as needed. For red eyes    Historical Provider, MD    No Known Allergies  History   Social History  . Marital Status: Divorced    Spouse Name: N/A    Number of Children: N/A  . Years of Education: N/A   Occupational History  . Not on file.   Social History Main Topics  . Smoking status: Never Smoker   . Smokeless tobacco: Not on file  . Alcohol Use: No  . Drug Use: No  . Sexually Active: Not on file   Other Topics Concern  . Not on file   Social History Narrative  . No narrative on file    Family History  Problem Relation Age of Onset  . Cancer Father     prostate  . Heart disease Father 51    MI   . Cancer Maternal Grandfather   . Cancer Paternal Grandfather   . Diabetes Mother   . Hypertension Mother   . Hypertension Father     Objective:   Physical Exam  Constitutional: He is oriented to person, place, and time. He appears well-developed and well-nourished. No distress.  HENT:  Head: Normocephalic and atraumatic.  Eyes: Conjunctivae normal are normal. Pupils are equal, round, and reactive to light.  Neck: Normal range of motion. Neck supple. No  thyromegaly present.  Cardiovascular: Normal rate, regular rhythm, normal heart sounds and intact distal pulses.  Exam reveals no gallop and no friction rub.   No murmur heard. Pulmonary/Chest: Effort normal and breath sounds normal. No respiratory distress. He has no wheezes. He has no rales.  Abdominal: Soft. Bowel sounds are normal. He exhibits no distension. There is no tenderness. There is no rebound and no guarding.  Lymphadenopathy:    He has no cervical adenopathy.  Neurological: He is alert and oriented to person, place, and time. He has normal reflexes. No cranial nerve deficit. He exhibits normal muscle tone.  Skin: He is  not diaphoretic.  Psychiatric: He has a normal mood and affect. His behavior is normal.    EKG: NSR rate 64; no T wave changes.    Results for orders placed in visit on 10/23/12  POCT CBC      Component Value Range   WBC 6.1  4.6 - 10.2 K/uL   Lymph, poc 2.8  0.6 - 3.4   POC LYMPH PERCENT 46.1  10 - 50 %L   MID (cbc) 0.4  0 - 0.9   POC MID % 6.5  0 - 12 %M   POC Granulocyte 2.9  2 - 6.9   Granulocyte percent 47.4  37 - 80 %G   RBC 4.81  4.69 - 6.13 M/uL   Hemoglobin 13.5 (*) 14.1 - 18.1 g/dL   HCT, POC 16.1 (*) 09.6 - 53.7 %   MCV 89.8  80 - 97 fL   MCH, POC 28.1  27 - 31.2 pg   MCHC 31.3 (*) 31.8 - 35.4 g/dL   RDW, POC 04.5     Platelet Count, POC 209  142 - 424 K/uL   MPV 9.3  0 - 99.8 fL  POCT URINALYSIS DIPSTICK      Component Value Range   Color, UA yellow     Clarity, UA clear     Glucose, UA neg     Bilirubin, UA neg     Ketones, UA neg     Spec Grav, UA 1.020     Blood, UA neg     pH, UA 6.0     Protein, UA neg     Urobilinogen, UA 0.2     Nitrite, UA neg     Leukocytes, UA Negative    POCT UA - MICROSCOPIC ONLY      Component Value Range   WBC, Ur, HPF, POC neg     RBC, urine, microscopic neg     Bacteria, U Microscopic neg     Mucus, UA neg     Epithelial cells, urine per micros neg     Crystals, Ur, HPF, POC neg     Casts, Ur, LPF, POC neg     Yeast, UA neg     INFLUENZA VACCINE ADMINISTERED.  Assessment & Plan:   1. Essential hypertension, benign  POCT CBC, POCT urinalysis dipstick, POCT UA - Microscopic Only, Comprehensive metabolic panel, EKG 12-Lead, amLODipine (NORVASC) 10 MG tablet, bisoprolol (ZEBETA) 10 MG tablet, lisinopril (PRINIVIL,ZESTRIL) 20 MG tablet  2. HTN (hypertension), malignant    3. HA (headache)    4. Migraine headache with aura    5.  Influenza vaccine.    1.  HTN: uncontrolled; asymptomatic; non-compliance with Amlodipine since last visit six months.  Restart Norvasc; continue Bisoprolol. 2.  Headaches:  Improved.  No  recent headaches. 3.  Migraine headaches:  Stable; no recent headaches. 4. Immunizations:  Pt to call insurance regarding TDAP coverage.  S/p Influenza vaccine in office.  Meds ordered this encounter  Medications  . DISCONTD: amLODipine (NORVASC) 10 MG tablet    Sig: Take 1/2 tablet daily x 1 week then increase to 1 tablet daily    Dispense:  90 tablet    Refill:  1  . bisoprolol (ZEBETA) 10 MG tablet    Sig: Take 1 tablet (10 mg total) by mouth daily.    Dispense:  90 tablet    Refill:  1  . lisinopril (PRINIVIL,ZESTRIL) 20 MG tablet    Sig: Take 1 tablet (20 mg total) by mouth daily.    Dispense:  90 tablet    Refill:  1

## 2012-10-31 ENCOUNTER — Encounter: Payer: Self-pay | Admitting: Radiology

## 2012-11-01 ENCOUNTER — Telehealth: Payer: Self-pay

## 2012-11-01 NOTE — Telephone Encounter (Signed)
Pt called back to get lab results. Advised pt that all labs were normal except he is slightly anemic. Asked pt per Dr Michaelle Copas note if he has scheduled his CPE yet and he reported that he is scheduled for 11/08/12. Advised pt that this lab can be discussed at CPE to decide if any supplement is needed. Pt agreed.

## 2012-11-08 ENCOUNTER — Encounter: Payer: Self-pay | Admitting: Family Medicine

## 2012-11-08 ENCOUNTER — Ambulatory Visit (INDEPENDENT_AMBULATORY_CARE_PROVIDER_SITE_OTHER): Payer: Medicare Other | Admitting: Family Medicine

## 2012-11-08 VITALS — BP 132/96 | HR 69 | Temp 98.1°F | Resp 16 | Ht 68.0 in | Wt 287.0 lb

## 2012-11-08 DIAGNOSIS — D509 Iron deficiency anemia, unspecified: Secondary | ICD-10-CM

## 2012-11-08 DIAGNOSIS — R5383 Other fatigue: Secondary | ICD-10-CM

## 2012-11-08 DIAGNOSIS — R9431 Abnormal electrocardiogram [ECG] [EKG]: Secondary | ICD-10-CM

## 2012-11-08 DIAGNOSIS — R0602 Shortness of breath: Secondary | ICD-10-CM

## 2012-11-08 DIAGNOSIS — Z0000 Encounter for general adult medical examination without abnormal findings: Secondary | ICD-10-CM

## 2012-11-08 DIAGNOSIS — Z8042 Family history of malignant neoplasm of prostate: Secondary | ICD-10-CM

## 2012-11-08 DIAGNOSIS — D649 Anemia, unspecified: Secondary | ICD-10-CM

## 2012-11-08 DIAGNOSIS — I10 Essential (primary) hypertension: Secondary | ICD-10-CM

## 2012-11-08 DIAGNOSIS — R0600 Dyspnea, unspecified: Secondary | ICD-10-CM

## 2012-11-08 LAB — COMPREHENSIVE METABOLIC PANEL
AST: 15 U/L (ref 0–37)
Albumin: 4.3 g/dL (ref 3.5–5.2)
Alkaline Phosphatase: 62 U/L (ref 39–117)
BUN: 11 mg/dL (ref 6–23)
Creat: 0.87 mg/dL (ref 0.50–1.35)
Glucose, Bld: 107 mg/dL — ABNORMAL HIGH (ref 70–99)
Total Bilirubin: 0.3 mg/dL (ref 0.3–1.2)

## 2012-11-08 LAB — POCT CBC
Granulocyte percent: 35.5 %G — AB (ref 37–80)
HCT, POC: 44.9 % (ref 43.5–53.7)
Lymph, poc: 2.6 (ref 0.6–3.4)
MCH, POC: 27.8 pg (ref 27–31.2)
MCHC: 31 g/dL — AB (ref 31.8–35.4)
MCV: 89.8 fL (ref 80–97)
MID (cbc): 0.4 (ref 0–0.9)
POC LYMPH PERCENT: 55.9 %L — AB (ref 10–50)
Platelet Count, POC: 197 10*3/uL (ref 142–424)
RDW, POC: 15 %

## 2012-11-08 LAB — LIPID PANEL
HDL: 38 mg/dL — ABNORMAL LOW (ref >39)
LDL Cholesterol: 86 mg/dL (ref 0–99)
Total CHOL/HDL Ratio: 3.5 Ratio
Triglycerides: 44 mg/dL (ref <150)
VLDL: 9 mg/dL (ref 0–40)

## 2012-11-08 LAB — IFOBT (OCCULT BLOOD): IFOBT: NEGATIVE

## 2012-11-08 NOTE — Progress Notes (Signed)
  Subjective:    Patient ID: Michael [name], male    DOB: September 14, 1971, 41 y.o.   MRN: [id number]  HPI    Review of Systems  Constitutional: Positive for fatigue.  HENT: Positive for dental problem.   Eyes: Negative.   Respiratory: Positive for shortness of breath.   Cardiovascular: Positive for chest pain.  Gastrointestinal: Negative.   Genitourinary: Negative.   Musculoskeletal: Positive for back pain.  Skin: Negative.   Neurological: Positive for headaches.  Hematological: Negative.   Psychiatric/Behavioral: Negative.        Objective:   Physical Exam        Assessment & Plan:

## 2012-11-08 NOTE — Progress Notes (Signed)
Subjective:    Patient ID: Michael [name], male    DOB: Apr 04, 1971, 41 y.o.   MRN: [id number]  HPI Michael [name] is a 41 y.o. male Here for CPE.   Hx of HTN - with episodic med nonadherence.  See last office visit and admission in May of this year for HA and malignant HTN.  Instructed to restart amlodipine last office visit.  No recent missed doses. Does not check outside blood pressures.  Has felt fatigued past few days.  Felt short of breath with walking past week - no recent chest pain.  Does admit to chest pain about a month ago. Has a scar on the chest and rib fracture form 1998- this area is sore every now and then.   Lipids overall WNL in May of this year.   Anemia - hemoglobin 13.5 on 10/23/12 with Dr Katrinka Blazing. No history of colonoscopy.   Admits to some stress.  Lives with fiance in her mother's condo.  Stressful about finances or rent.  On Trazodone for sleep - takes 2 at bedtime. zoloft for depression. 100mg  qd - followed by Dr Ladona Ridgel at [facility] - has appt this month. No recent suicide thoughts.  Did have suicide attempt - 2010.  No hospitalization.    Working part time in a [employer], [employer].  Nonsmoker.   History repeated and ROS reviewed multiple times to clarify answers.   FH CAD - dad with CABG, CAD in 50's. deceased at 65. Colon cancer in 60's? - later changed answer to prostate cancer.    Review of Systems  Constitutional: Positive for fatigue (past few days. ). Negative for unexpected weight change.  Eyes: Negative for visual disturbance.  Respiratory: Positive for shortness of breath. Negative for cough and chest tightness.   Cardiovascular: Positive for chest pain. Negative for palpitations and leg swelling.  Gastrointestinal: Negative for abdominal pain and blood in stool.       No dark or tarry stools.   Musculoskeletal: Positive for back pain (longstanding without recent changes. ).  Neurological: Positive for headaches (longstanding headaches, but improves with blood  pressures improving. ). Negative for dizziness and light-headedness.  All other systems reviewed and are negative.       Objective:   Physical Exam  Vitals reviewed. Constitutional: He is oriented to person, place, and time. He appears well-developed and well-nourished.  HENT:  Head: Normocephalic and atraumatic.  Right Ear: External ear normal.  Left Ear: External ear normal.  Mouth/Throat: Oropharynx is clear and moist.  Eyes: Conjunctivae normal and EOM are normal. Pupils are equal, round, and reactive to light.  Neck: Normal range of motion. Neck supple. No thyromegaly present.  Cardiovascular: Normal rate, regular rhythm, normal heart sounds and intact distal pulses.   Pulmonary/Chest: Effort normal and breath sounds normal. No respiratory distress. He has no wheezes.  Abdominal: Soft. He exhibits no distension. There is no tenderness.  Genitourinary: Prostate normal.  Musculoskeletal: Normal range of motion. He exhibits no edema and no tenderness.  Lymphadenopathy:    He has no cervical adenopathy.  Neurological: He is alert and oriented to person, place, and time. He has normal reflexes.  Skin: Skin is warm and dry.  Psychiatric: He has a normal mood and affect. His behavior is normal.   EKG: SR, inferolateral t wave inversion - seen on  Prior EKG's.    Results for orders placed in visit on 11/08/12  POCT CBC      Component Value Range   WBC 4.6  4.6 - 10.2 K/uL   Lymph, poc 2.6  0.6 - 3.4   POC LYMPH PERCENT 55.9 (*) 10 - 50 %L   MID (cbc) 0.4  0 - 0.9   POC MID % 8.6  0 - 12 %M   POC Granulocyte 1.6 (*) 2 - 6.9   Granulocyte percent 35.5 (*) 37 - 80 %G   RBC 5.00  4.69 - 6.13 M/uL   Hemoglobin 13.9 (*) 14.1 - 18.1 g/dL   HCT, POC 16.1  09.6 - 53.7 %   MCV 89.8  80 - 97 fL   MCH, POC 27.8  27 - 31.2 pg   MCHC 31.0 (*) 31.8 - 35.4 g/dL   RDW, POC 04.5     Platelet Count, POC 197  142 - 424 K/uL   MPV 10.4  0 - 99.8 fL  IFOBT (OCCULT BLOOD)      Component Value  Range   IFOBT Negative         Assessment & Plan:  Michael [name] is a 41 y.o. male 1. Annual physical exam  POCT CBC, Comprehensive metabolic panel, Lipid panel  2. HTN (hypertension)  Comprehensive metabolic panel, Lipid panel, EKG 12-Lead, TSH  3. Fatigue  EKG 12-Lead, TSH  4. Dyspnea  EKG 12-Lead, TSH  5. Anemia  POCT CBC, IFOBT POC (occult bld, rslt in office)  6. Family hx of prostate cancer     CPE - see labs above. Plan on follow up for discussion of HA and low back pain as well as other concerns discussed today.   Possible FH of prostate cancer, but difficult history - not sure if prostate ca or colon ca in father.  Heme negative stool but borderline anemia. Discussed PSA, but not covered at his age - declined at present, but recommended by age 46, and he will check if this test will be covered if he wants to have done sooner.  HTN - improved control, still not at goal. Recheck in next 2 weeks. Continue same meds for now.  Check outside bp's and bring to next ov.  DOE/fatigue - stable borderline hgb. Abnormal ekg, but no recent chest pain, and changes seen prior.  By description,  chest pain likely musculoskeletal in nature. refer to cards as below.  Recheck in next 2 weeks.   Abnormal EKG - inferolateral TWI - nonspecific.  Thought to be due to LV overload prior, but persistent with improved BP control in office. Nonspecific, but with HTN and possible early FH CAD,  - will refer to cardiology for eval.  Rtc/er/911 chest pain precautions reviewed and understanding voiced.   Patient Instructions  Your should receive a call or letter about your lab results within the next week to 10 days.  Keep a record of your blood pressures outside of the office and bring them to the next office visit.  Return to the clinic or go to the nearest emergency room if any of your symptoms worsen or new symptoms occur, including but not limited to increase shortness of breath or any chest pains.  We  will refer you to a heart doctor in the next few weeks, but if any new or worsening symptoms as above - call 911 or go to the emergency room.   Plan on follow up to discuss concerns from this office visit and others in next few weeks.

## 2012-11-08 NOTE — Patient Instructions (Addendum)
Your should receive a call or letter about your lab results within the next week to 10 days.  Keep a record of your blood pressures outside of the office and bring them to the next office visit.  Return to the clinic or go to the nearest emergency room if any of your symptoms worsen or new symptoms occur, including but not limited to increase shortness of breath or any chest pains.  We will refer you to a heart doctor in the next few weeks, but if any new or worsening symptoms as above - call 911 or go to the emergency room.   Plan on follow up to discuss concerns from this office visit and others in next few weeks. We can recheck your blood count then, but it appears stable today.

## 2012-11-09 ENCOUNTER — Encounter: Payer: Self-pay | Admitting: *Deleted

## 2012-11-22 ENCOUNTER — Encounter: Payer: Self-pay | Admitting: Family Medicine

## 2012-11-22 ENCOUNTER — Ambulatory Visit (INDEPENDENT_AMBULATORY_CARE_PROVIDER_SITE_OTHER): Payer: 59 | Admitting: Family Medicine

## 2012-11-22 VITALS — BP 131/92 | HR 75 | Temp 97.3°F | Resp 16 | Ht 69.0 in | Wt 288.0 lb

## 2012-11-22 DIAGNOSIS — M79669 Pain in unspecified lower leg: Secondary | ICD-10-CM

## 2012-11-22 DIAGNOSIS — M2669 Other specified disorders of temporomandibular joint: Secondary | ICD-10-CM

## 2012-11-22 DIAGNOSIS — R739 Hyperglycemia, unspecified: Secondary | ICD-10-CM

## 2012-11-22 DIAGNOSIS — R7309 Other abnormal glucose: Secondary | ICD-10-CM

## 2012-11-22 DIAGNOSIS — M26629 Arthralgia of temporomandibular joint, unspecified side: Secondary | ICD-10-CM

## 2012-11-22 DIAGNOSIS — D649 Anemia, unspecified: Secondary | ICD-10-CM

## 2012-11-22 DIAGNOSIS — I10 Essential (primary) hypertension: Secondary | ICD-10-CM

## 2012-11-22 DIAGNOSIS — M79609 Pain in unspecified limb: Secondary | ICD-10-CM

## 2012-11-22 LAB — CBC WITH DIFFERENTIAL/PLATELET
Basophils Absolute: 0 10*3/uL (ref 0.0–0.1)
Eosinophils Absolute: 0.1 10*3/uL (ref 0.0–0.7)
Eosinophils Relative: 1 % (ref 0–5)
HCT: 40 % (ref 39.0–52.0)
Lymphocytes Relative: 48 % — ABNORMAL HIGH (ref 12–46)
Lymphs Abs: 2.6 10*3/uL (ref 0.7–4.0)
MCH: 28.9 pg (ref 26.0–34.0)
MCV: 83.7 fL (ref 78.0–100.0)
Monocytes Absolute: 0.3 10*3/uL (ref 0.1–1.0)
RDW: 14.3 % (ref 11.5–15.5)
WBC: 5.3 10*3/uL (ref 4.0–10.5)

## 2012-11-22 MED ORDER — CYCLOBENZAPRINE HCL 5 MG PO TABS: ORAL_TABLET | ORAL | Status: DC

## 2012-11-22 NOTE — Patient Instructions (Addendum)
Your jaw pain is likely from TMJ syndrome.   You can take the cyclobenzaprine at night- but do not combine this with other sedating medicines, including the trazodone. See below for other information on this condition.  If not improving in next few weeks - we can refer you to a specialist. Return to the clinic or go to the nearest emergency room if any of your symptoms worsen or new symptoms occur. Temporomandibular Problems  Temporomandibular joint (TMJ) dysfunction means there are problems with the joint between your jaw and your skull. This is a joint lined by cartilage like other joints in your body but also has a small disc in the joint which keeps the bones from rubbing on each other. These joints are like other joints and can get inflamed (sore) from arthritis and other problems. When this joint gets sore, it can cause headaches and pain in the jaw and the face. CAUSES  Usually the arthritic types of problems are caused by soreness in the joint. Soreness in the joint can also be caused by overuse. This may come from grinding your teeth. It may also come from mis-alignment in the joint. DIAGNOSIS Diagnosis of this condition can often be made by history and exam. Sometimes your caregiver may need X-rays or an MRI scan to determine the exact cause. It may be necessary to see your dentist to determine if your teeth and jaws are lined up correctly. TREATMENT  Most of the time this problem is not serious; however, sometimes it can persist (become chronic). When this happens medications that will cut down on inflammation (soreness) help. Sometimes a shot of cortisone into the joint will be helpful. If your teeth are not aligned it may help for your dentist to make a splint for your mouth that can help this problem. If no physical problems can be found, the problem may come from tension. If tension is found to be the cause, biofeedback or relaxation techniques may be helpful. HOME CARE INSTRUCTIONS    Later in the day, applications of ice packs may be helpful. Ice can be used in a plastic bag with a towel around it to prevent frostbite to skin. This may be used about every 2 hours for 20 to 30 minutes, as needed while awake, or as directed by your caregiver.  Only take over-the-counter or prescription medicines for pain, discomfort, or fever as directed by your caregiver.  If physical therapy was prescribed, follow your caregiver's directions.  Wear mouth appliances as directed if they were given. Document Released: 09/09/2001 Document Revised: 03/08/2012 Document Reviewed: 12/17/2008 [facility] Patient Information 2013 [place], [place]. Temporomandibular Joint Pain Your exam shows that you have a problem with your temporomandibular joint (TMJ), the joint that moves when you open your mouth or chew food. TMJ problems can result from direct injuries, bite abnormalities, or tension states which cause you to grind or clench your teeth. Typical symptoms include pain around the joint, clicking, restricted movement, and headaches. The TMJ is like any other joint in the body; when it is strained, it needs rest to repair itself. To keep the joint at rest it is important that you do not open your mouth wider than the width of your index finger. If you must yawn, be sure to support your chin with your hand so your mouth does not open wide. Eat a soft diet (nothing firmer than ground beef, no raw vegetables), do not chew gum and do not talk if it causes you pain. Apply  topical heat by using a warm, moist cloth placed in front of the ear for 15 to 20 minutes several times daily. Alternating heat and ice may give even more relief. Anti-inflammatory pain medicine and muscle relaxants can also be helpful. A dental orthotic or splint may be used for temporary relief. Long-term problems may require treatment for stress as well as braces or surgery. Please check with your doctor or dentist if your symptoms do not  improve within one week. Document Released: 01/22/2005 Document Revised: 03/08/2012 Document Reviewed: 12/15/2005 [facility] Patient Information 2013 [place], [place].    Your leg pains are likely due to starting a new exercise regimen.  You can take tylenol as needed, as other pain medicines can raise your blood pressure.  Go slow and less intensity with your exercise.recheck in the next 2 weeks.  Return to the clinic or go to the nearest emergency room if any of your symptoms worsen or new symptoms occur.  Continue your same blood pressure medicine.   You have an appointment with cardiology on December 5th.  Return to the clinic or go to the nearest emergency room if any of your symptoms worsen or new symptoms occur.

## 2012-11-22 NOTE — Progress Notes (Signed)
Subjective:    Patient ID: Michael [name], male    DOB: 1971-03-22, 41 y.o.   MRN: [id number]  HPI Michael [name] is a 41 y.o. male Here for follow up from CPE - see last ov on 11/08/12 and concerns addressed at that time.   Depression - On Trazodone for sleep - takes 2 at bedtime. zoloft for depression. 100mg  qd - followed by Dr Ladona Ridgel at [facility].  Possible FH of prostate cancer, but difficult history - not sure if prostate ca or colon ca in father. Heme negative stool but borderline anemia. Discussed PSA, but not covered at his age - declined at present, but recommended by age 8, and he will check if this test will be covered if he wants to have done sooner.   HTN - improved control, but still not at goal last ov. Had just started amlodipine after 10/23/12 office visit. Unknown home blood pressures.  No new side effects, no orthostatic sx's.  DOE/fatigue - stable borderline hgb. Abnormal ekg, but no recent chest pain, and changes seen prior. By description, chest pain likely musculoskeletal in nature. Referred to cardiologist as had abnormal EKG - inferolateral TWI - nonspecific, at last ov and possible early FH CAD. Has not seen heart doctor yet. No phone call yet. No recent chest pains.   Results for orders placed in visit on 11/08/12  POCT CBC      Component Value Range   WBC 4.6  4.6 - 10.2 K/uL   Lymph, poc 2.6  0.6 - 3.4   POC LYMPH PERCENT 55.9 (*) 10 - 50 %L   MID (cbc) 0.4  0 - 0.9   POC MID % 8.6  0 - 12 %M   POC Granulocyte 1.6 (*) 2 - 6.9   Granulocyte percent 35.5 (*) 37 - 80 %G   RBC 5.00  4.69 - 6.13 M/uL   Hemoglobin 13.9 (*) 14.1 - 18.1 g/dL   HCT, POC 86.5  78.4 - 53.7 %   MCV 89.8  80 - 97 fL   MCH, POC 27.8  27 - 31.2 pg   MCHC 31.0 (*) 31.8 - 35.4 g/dL   RDW, POC 69.6     Platelet Count, POC 197  142 - 424 K/uL   MPV 10.4  0 - 99.8 fL  COMPREHENSIVE METABOLIC PANEL      Component Value Range   Sodium 139  135 - 145 mEq/L   Potassium 3.7  3.5 - 5.3 mEq/L   Chloride 104  96 - 112 mEq/L   CO2 28  19 - 32 mEq/L   Glucose, Bld 107 (*) 70 - 99 mg/dL   BUN 11  6 - 23 mg/dL   Creat 2.95  2.84 - 1.32 mg/dL   Total Bilirubin 0.3  0.3 - 1.2 mg/dL   Alkaline Phosphatase 62  39 - 117 U/L   AST 15  0 - 37 U/L   ALT 10  0 - 53 U/L   Total Protein 7.3  6.0 - 8.3 g/dL   Albumin 4.3  3.5 - 5.2 g/dL   Calcium 8.8  8.4 - 44.0 mg/dL  LIPID PANEL      Component Value Range   Cholesterol 133  0 - 200 mg/dL   Triglycerides 44  <102 mg/dL   HDL 38 (*) >72 mg/dL   Total CHOL/HDL Ratio 3.5     VLDL 9  0 - 40 mg/dL   LDL Cholesterol 86  0 - 99 mg/dL  IFOBT (OCCULT BLOOD)      Component Value Range   IFOBT Negative    TSH      Component Value Range   TSH 0.716  0.350 - 4.500 uIU/mL   Less headaches.  Hyperglycemia - blood sugar 107 at physical. Fasting again today.   Today - other concerns:  Jaw pain - R jaw pain - on and off past few years.  Most recent sx's x 1 month.  Sore more when cold.  Feels like gets stuck/ sore with yawning - tries to massage to help sx's. No hx of dislocation. Taking alleve - not helping. Took oxycontin in past - when seen in [place]. "that's the only thing that helps the pain". Last rx - about a year ago.   Leg pain - past few days. Back of both legs - calves to feet - started on left side, now on ride.notes with prolonged walking. Taking alleve. 3 days only.  No chest pain, no shortness of breath.  Noted after starting to workout.  Elliptical and treadmill.   Review of Systems As above.     Objective:   Physical Exam  Constitutional: He is oriented to person, place, and time. He appears well-developed and well-nourished.  HENT:  Head:    Mouth/Throat: Mucous membranes are normal. No dental abscesses or dental caries. No oropharyngeal exudate.  Cardiovascular: Normal rate, regular rhythm, normal heart sounds and intact distal pulses.   Pulmonary/Chest: Effort normal and breath sounds normal.  Musculoskeletal: He  exhibits no edema.       Diffuse mild ttp to lower extremities bilaterally. No focal defect or swelling/edema.   Neurological: He is alert and oriented to person, place, and time.  Skin: Skin is warm and dry.  Psychiatric: He has a normal mood and affect. His behavior is normal. Judgment and thought content normal.   Results for orders placed in visit on 11/22/12  GLUCOSE, POCT (MANUAL RESULT ENTRY)      Component Value Range   POC Glucose 110 (*) 70 - 99 mg/dl  POCT GLYCOSYLATED HEMOGLOBIN (HGB A1C)      Component Value Range   Hemoglobin A1C 5.6          Assessment & Plan:  Michael [name] is a 41 y.o. male 1. Hyperglycemia  POCT glucose (manual entry), CBC with Differential, POCT glycosylated hemoglobin (Hb A1C) Discussed weight loss, exercise - slow onset, and recheck glucose in 3-6 months.   2. HTN (hypertension)  CBC with Differential, CK.  Improved control.  Continue same doses of meds.   3. Anemia  Recheck cbc.  Borderline prior.  Taking iron now.   4. Pain, lower leg  CBC with Differential, CK.  Suspect DOMS with new exercise routine.  Discussed slow restart to exercise, rtc precautions, and CK level.   5. TMJ syndrome  CBC with Differential, cyclobenzaprine (FLEXERIL) 5 MG tablet.  Suspected TMJ.  Discussed reason for not prescribing narcotic at this point, but consider ENT eval if not improving with flexeril and instructions below.    Recheck in 2 weeks.   Patient Instructions  Your jaw pain is likely from TMJ syndrome.   You can take the cyclobenzaprine at night- but do not combine this with other sedating medicines, including the trazodone. See below for other information on this condition.  If not improving in next few weeks - we can refer you to a specialist. Return to the clinic or go to the nearest emergency room if any of your  symptoms worsen or new symptoms occur. Temporomandibular Problems  Temporomandibular joint (TMJ) dysfunction means there are problems with the  joint between your jaw and your skull. This is a joint lined by cartilage like other joints in your body but also has a small disc in the joint which keeps the bones from rubbing on each other. These joints are like other joints and can get inflamed (sore) from arthritis and other problems. When this joint gets sore, it can cause headaches and pain in the jaw and the face. CAUSES  Usually the arthritic types of problems are caused by soreness in the joint. Soreness in the joint can also be caused by overuse. This may come from grinding your teeth. It may also come from mis-alignment in the joint. DIAGNOSIS Diagnosis of this condition can often be made by history and exam. Sometimes your caregiver may need X-rays or an MRI scan to determine the exact cause. It may be necessary to see your dentist to determine if your teeth and jaws are lined up correctly. TREATMENT  Most of the time this problem is not serious; however, sometimes it can persist (become chronic). When this happens medications that will cut down on inflammation (soreness) help. Sometimes a shot of cortisone into the joint will be helpful. If your teeth are not aligned it may help for your dentist to make a splint for your mouth that can help this problem. If no physical problems can be found, the problem may come from tension. If tension is found to be the cause, biofeedback or relaxation techniques may be helpful. HOME CARE INSTRUCTIONS   Later in the day, applications of ice packs may be helpful. Ice can be used in a plastic bag with a towel around it to prevent frostbite to skin. This may be used about every 2 hours for 20 to 30 minutes, as needed while awake, or as directed by your caregiver.  Only take over-the-counter or prescription medicines for pain, discomfort, or fever as directed by your caregiver.  If physical therapy was prescribed, follow your caregiver's directions.  Wear mouth appliances as directed if they were  given. Document Released: 09/09/2001 Document Revised: 03/08/2012 Document Reviewed: 12/17/2008 [facility] Patient Information 2013 [place], [place]. Temporomandibular Joint Pain Your exam shows that you have a problem with your temporomandibular joint (TMJ), the joint that moves when you open your mouth or chew food. TMJ problems can result from direct injuries, bite abnormalities, or tension states which cause you to grind or clench your teeth. Typical symptoms include pain around the joint, clicking, restricted movement, and headaches. The TMJ is like any other joint in the body; when it is strained, it needs rest to repair itself. To keep the joint at rest it is important that you do not open your mouth wider than the width of your index finger. If you must yawn, be sure to support your chin with your hand so your mouth does not open wide. Eat a soft diet (nothing firmer than ground beef, no raw vegetables), do not chew gum and do not talk if it causes you pain. Apply topical heat by using a warm, moist cloth placed in front of the ear for 15 to 20 minutes several times daily. Alternating heat and ice may give even more relief. Anti-inflammatory pain medicine and muscle relaxants can also be helpful. A dental orthotic or splint may be used for temporary relief. Long-term problems may require treatment for stress as well as braces or surgery. Please check  with your doctor or dentist if your symptoms do not improve within one week. Document Released: 01/22/2005 Document Revised: 03/08/2012 Document Reviewed: 12/15/2005 [facility] Patient Information 2013 [place], [place].    Your leg pains are likely due to starting a new exercise regimen.  You can take tylenol as needed, as other pain medicines can raise your blood pressure.  Go slow and less intensity with your exercise.recheck in the next 2 weeks.  Return to the clinic or go to the nearest emergency room if any of your symptoms worsen or new symptoms  occur.  Continue your same blood pressure medicine.   You have an appointment with cardiology on December 5th.  Return to the clinic or go to the nearest emergency room if any of your symptoms worsen or new symptoms occur.

## 2012-12-02 ENCOUNTER — Encounter: Payer: Self-pay | Admitting: Cardiovascular Disease

## 2012-12-02 ENCOUNTER — Ambulatory Visit (INDEPENDENT_AMBULATORY_CARE_PROVIDER_SITE_OTHER): Payer: PRIVATE HEALTH INSURANCE | Admitting: Cardiovascular Disease

## 2012-12-02 VITALS — BP 144/105 | HR 70 | Resp 18 | Ht 69.0 in | Wt 295.1 lb

## 2012-12-02 DIAGNOSIS — R9431 Abnormal electrocardiogram [ECG] [EKG]: Secondary | ICD-10-CM

## 2012-12-02 DIAGNOSIS — I10 Essential (primary) hypertension: Secondary | ICD-10-CM

## 2012-12-02 NOTE — Patient Instructions (Addendum)
Your physician recommends that you schedule a follow-up appointment in: 3 weeks with NP or PA.  Your physician has requested that you have an exercise tolerance test.To be done with PA or NP. For further information please visit [email or web address] Please also follow instruction sheet, as given.   Your physician has requested that you have an echocardiogram. Echocardiography is a painless test that uses sound waves to create images of your heart. It provides your doctor with information about the size and shape of your heart and how well your heart's chambers and valves are working. This procedure takes approximately one hour. There are no restrictions for this procedure.

## 2012-12-02 NOTE — Progress Notes (Signed)
History of Present Illness: 41 yo male with history of HTN, migraine headaches who is added onto my schedule today for evaluation of an abnormal EKG. He was admitted to [facility] in May 2013 by Dr. Antoine Poche for hypertensive emergency with headaches. Head CT negative. There were plans for an outpatient echo and cardiology f/u with Dr. Antoine Poche but this was not completed. His EKG in May 2013 showed NSR with T wave inversions in the inferior and anterolateal leads. The EKG is unchanged today. He is a bad historian. He feels well. He has occasional fatigue. He admits to coughing when around cats and having some wheezing. No chest pain or SOB. He does not smoke.   Primary Care Physician: Meredith Staggers  Last Lipid Profile:Lipid Panel     Component Value Date/Time   CHOL 133 11/08/2012 0742   TRIG 44 11/08/2012 0742   HDL 38* 11/08/2012 0742   CHOLHDL 3.5 11/08/2012 0742   VLDL 9 11/08/2012 0742   LDLCALC 86 11/08/2012 0742     Past Medical History  Diagnosis Date  . Hypertension   . Migraine   . Depression     Questionable per records. Not on any medication.   . Schizophrenia     Questionable per records. Not on any medication.   . Noncompliance   . Anxiety   . Insomnia     Past Surgical History  Procedure Date  . Mandible surgery 1998    metal plate  . Admission 04/2012    Malignant HTN, HA.  CT head negative, cardiology consult.      Current Outpatient Prescriptions  Medication Sig Dispense Refill  . amLODipine (NORVASC) 10 MG tablet Take 10 mg by mouth daily.      . bisoprolol (ZEBETA) 10 MG tablet Take 1 tablet (10 mg total) by mouth daily.  90 tablet  1  . cyclobenzaprine (FLEXERIL) 5 MG tablet 1 to 2 pills ar bedtime as needed for jaw pain.  15 tablet  0  . lisinopril (PRINIVIL,ZESTRIL) 20 MG tablet Take 1 tablet (20 mg total) by mouth daily.  90 tablet  1  . tetrahydrozoline (VISINE) 0.05 % ophthalmic solution Apply 1 drop to eye as needed. For red eyes      .  traZODone (DESYREL) 100 MG tablet Take 100 mg by mouth at bedtime.        No Known Allergies  History   Social History  . Marital Status: Divorced    Spouse Name: N/A    Number of Children: N/A  . Years of Education: N/A   Occupational History  . Not on file.   Social History Main Topics  . Smoking status: Never Smoker   . Smokeless tobacco: Never Used  . Alcohol Use: No  . Drug Use: No  . Sexually Active: Not on file     Comment: per pt's [employer] form - 1 sex partner in last 12 months   Other Topics Concern  . Not on file   Social History Narrative   Marital status: single; dating with fiance   Children: 5 children, no grandchildren.  Lives: with fiance, fiance children.   Employment:  Disability for learning disabilities since 2012.       Family History  Problem Relation Age of Onset  . Cancer Father     prostate, colon- age was in his 43's  . Heart disease Father 70    MI   . Hypertension Father   . Cancer Maternal Grandfather   .  Cancer Paternal Grandfather   . Diabetes Mother   . Hypertension Mother   . Hypertension Brother     Review of Systems:  As stated in the HPI and otherwise negative.   BP 144/105  Pulse 70  Resp 18  Ht 5\' 9"  (1.753 m)  Wt 295 lb 1.9 oz (133.866 kg)  BMI 43.58 kg/m2  SpO2 98%  Physical Examination: General: Well developed, well nourished, NAD HEENT: OP clear, mucus membranes moist SKIN: warm, dry. No rashes. Neuro: No focal deficits Musculoskeletal: Muscle strength 5/5 all ext Psychiatric: Mood and affect normal Neck: No JVD, no carotid bruits, no thyromegaly, no lymphadenopathy. Lungs:Clear bilaterally, no wheezes, rhonci, crackles Cardiovascular: Regular rate and rhythm. No murmurs, gallops or rubs. Abdomen:Soft. Bowel sounds present. Non-tender.  Extremities: No lower extremity edema. Pulses are 2 + in the bilateral DP/PT.  EKG: NSR, T wave inversions inferior and anterolateral leads.   Assessment and Plan:    1. Abnormal EKG: His EKG has shown T wave inversions since at least May 2013. He has no angina or exertional SOB. I will arrange an echocardiogram to assess his LV function, wall motion and exclude any structural cardiac abnormalities. Will also arrange a treadmill stress test to exclude ischemia. If this is normal, he would not need further cardiac workup. I will have him follow up in our office with Tereso Newcomer, PA-C in 4 weeks and long term f/u with Dr. Antoine Poche if needed.   2. HTN: This is being managed in primary care. He may need a sleep study in the future.

## 2012-12-04 NOTE — Progress Notes (Signed)
Reviewed and agree.

## 2012-12-06 ENCOUNTER — Ambulatory Visit: Payer: Medicare Other | Admitting: Family Medicine

## 2012-12-13 ENCOUNTER — Ambulatory Visit (HOSPITAL_COMMUNITY): Payer: PRIVATE HEALTH INSURANCE | Attending: Cardiology | Admitting: Radiology

## 2012-12-13 DIAGNOSIS — Z8249 Family history of ischemic heart disease and other diseases of the circulatory system: Secondary | ICD-10-CM | POA: Insufficient documentation

## 2012-12-13 DIAGNOSIS — I369 Nonrheumatic tricuspid valve disorder, unspecified: Secondary | ICD-10-CM | POA: Insufficient documentation

## 2012-12-13 DIAGNOSIS — I10 Essential (primary) hypertension: Secondary | ICD-10-CM

## 2012-12-13 DIAGNOSIS — R9431 Abnormal electrocardiogram [ECG] [EKG]: Secondary | ICD-10-CM | POA: Insufficient documentation

## 2012-12-13 MED ORDER — PERFLUTREN PROTEIN A MICROSPH IV SUSP
2.0000 mL | Freq: Once | INTRAVENOUS | Status: AC
  Administered 2012-12-13: 2 mL via INTRAVENOUS

## 2012-12-13 NOTE — Progress Notes (Signed)
Echocardiogram performed with optison.  

## 2013-01-03 ENCOUNTER — Ambulatory Visit (INDEPENDENT_AMBULATORY_CARE_PROVIDER_SITE_OTHER): Payer: PRIVATE HEALTH INSURANCE | Admitting: Nurse Practitioner

## 2013-01-03 ENCOUNTER — Encounter: Payer: Self-pay | Admitting: Nurse Practitioner

## 2013-01-03 DIAGNOSIS — R0989 Other specified symptoms and signs involving the circulatory and respiratory systems: Secondary | ICD-10-CM

## 2013-01-03 MED ORDER — LISINOPRIL 20 MG PO TABS: 40.0000 mg | ORAL_TABLET | Freq: Every day | ORAL | Status: DC

## 2013-01-03 NOTE — Progress Notes (Signed)
   Michael [name] Date of Birth: [birth year] Medical Record #[id number]  History of Present Illness: Patient presented today for routine GXT for abnormal EKG with past interted T waves in the inferior and anterolateral leads. Has malignant HTN and documented noncompliance noted in the record.   He comes in today. He says he has been taking his medicines. BP is quite high at 143/113. He does not seem to have any complaint.   Current Outpatient Prescriptions on File Prior to Visit  Medication Sig Dispense Refill  . amLODipine (NORVASC) 10 MG tablet Take 10 mg by mouth daily.      . bisoprolol (ZEBETA) 10 MG tablet Take 1 tablet (10 mg total) by mouth daily.  90 tablet  1  . cyclobenzaprine (FLEXERIL) 5 MG tablet 1 to 2 pills ar bedtime as needed for jaw pain.  15 tablet  0  . lisinopril (PRINIVIL,ZESTRIL) 20 MG tablet Take 2 tablets (40 mg total) by mouth daily.  60 tablet  1  . tetrahydrozoline (VISINE) 0.05 % ophthalmic solution Apply 1 drop to eye as needed. For red eyes      . traZODone (DESYREL) 100 MG tablet Take 100 mg by mouth at bedtime.        No Known Allergies  Past Medical History  Diagnosis Date  . Hypertension   . Migraine   . Depression     Questionable per records. Not on any medication.   . Schizophrenia     Questionable per records. Not on any medication.   . Noncompliance   . Anxiety   . Insomnia     Past Surgical History  Procedure Date  . Mandible surgery 1998    metal plate  . Admission 04/2012    Malignant HTN, HA.  CT head negative, cardiology consult.      History  Smoking status  . Never Smoker   Smokeless tobacco  . Never Used    History  Alcohol Use No    Family History  Problem Relation Age of Onset  . Cancer Father     prostate, colon- age was in his 97's  . Heart disease Father 9    MI   . Hypertension Father   . Cancer Maternal Grandfather   . Cancer Paternal Grandfather   . Diabetes Mother   . Hypertension Mother   .  Hypertension Brother     Review of Systems: The review of systems is per the HPI.  All other systems were reviewed and are negative.  Physical Exam: N/A  LABORATORY DATA: N/A   Assessment / Plan: 1. Abnormal EKG - Today just has inverted T waves in V3 only. BP is elevated. I have increased his Lisinopril to 40 mg. Will have him seen back in about 2 weeks and if BP has improved will then schedule his GXT.   Patient is agreeable to this plan and will call if any problems develop in the interim.

## 2013-01-03 NOTE — Patient Instructions (Signed)
Increase your Lisinopril to 40 mg a day  Stay on all your other medicines  See Tereso Newcomer in about 2 weeks. If your blood pressure looks better then, we will arrange for a treadmill test  Avoid salt

## 2013-01-06 ENCOUNTER — Ambulatory Visit: Payer: PRIVATE HEALTH INSURANCE | Admitting: Physician Assistant

## 2013-01-19 ENCOUNTER — Ambulatory Visit: Payer: PRIVATE HEALTH INSURANCE | Admitting: Physician Assistant

## 2013-01-28 ENCOUNTER — Ambulatory Visit (INDEPENDENT_AMBULATORY_CARE_PROVIDER_SITE_OTHER): Payer: PRIVATE HEALTH INSURANCE | Admitting: Physician Assistant

## 2013-01-28 ENCOUNTER — Encounter: Payer: Self-pay | Admitting: Physician Assistant

## 2013-01-28 VITALS — BP 132/86 | HR 61 | Ht 69.0 in | Wt 298.0 lb

## 2013-01-28 DIAGNOSIS — R0683 Snoring: Secondary | ICD-10-CM

## 2013-01-28 DIAGNOSIS — I10 Essential (primary) hypertension: Secondary | ICD-10-CM

## 2013-01-28 DIAGNOSIS — R9431 Abnormal electrocardiogram [ECG] [EKG]: Secondary | ICD-10-CM

## 2013-01-28 LAB — BASIC METABOLIC PANEL
CO2: 28 mEq/L (ref 19–32)
Calcium: 8.6 mg/dL (ref 8.4–10.5)
Chloride: 104 mEq/L (ref 96–112)
Glucose, Bld: 90 mg/dL (ref 70–99)
Potassium: 3.6 mEq/L (ref 3.5–5.1)
Sodium: 138 mEq/L (ref 135–145)

## 2013-01-28 NOTE — Progress Notes (Signed)
[address], [address] [place], [place]  [address] Phone: [phone], Fax:  [phone]  Date:  01/28/2013   ID:  Michael [name], DOB 18-Feb-1971, MRN [id number]  PCP:  Shade Flood, MD  Primary Cardiologist:  Dr. Rollene Rotunda     History of Present Illness: Michael [name] is a 42 y.o. male who returns for followup on his blood pressure.   He has a hx of HTN, migraine headaches and an abnormal EKG. He initially saw Dr. Antoine Poche 5/13 when he was admitted for hypertensive emergency. Outpatient echo was to be done but never arranged. He saw Dr. Verne Carrow 12/02/12 for evaluation. He noted some dyspnea with exertion. ECG demonstrated inferior and anterolateral T wave inversions. Echocardiogram was arranged. Echo 12/13/12: Mild LVH, EF 50-55%, grade 1 diastolic dysfunction, mild LAE, mild RVE, mild RAE. He was to be set up for an exercise treadmill test. This was actually canceled due to uncontrolled blood pressure. His lisinopril was adjusted and he was brought back today for followup.  The patient denies chest pain, shortness of breath, syncope, orthopnea, PND or significant pedal edema.   Labs (11/13):   K 3.7, creatinine 0.87, ALT 10, LDL 86, Hgb 13.8, TSH 0.716  Wt Readings from Last 3 Encounters:  01/28/13 298 lb (135.172 kg)  12/02/12 295 lb 1.9 oz (133.866 kg)  11/22/12 288 lb (130.636 kg)     Past Medical History  Diagnosis Date  . Hypertension   . Migraine   . Depression     Questionable per records. Not on any medication.   . Schizophrenia     Questionable per records. Not on any medication.   . Noncompliance   . Anxiety   . Insomnia   . Hx of echocardiogram     a. Echo 12/13/12: Mild LVH, EF 50-55%, grade 1 diastolic dysfunction, mild LAE, mild RVE, mild RAE    Current Outpatient Prescriptions  Medication Sig Dispense Refill  . amLODipine (NORVASC) 10 MG tablet Take 10 mg by mouth daily.      . bisoprolol (ZEBETA) 10 MG tablet Take 1 tablet  (10 mg total) by mouth daily.  90 tablet  1  . cyclobenzaprine (FLEXERIL) 5 MG tablet 1 to 2 pills ar bedtime as needed for jaw pain.  15 tablet  0  . lisinopril (PRINIVIL,ZESTRIL) 20 MG tablet Take 2 tablets (40 mg total) by mouth daily.  60 tablet  1  . tetrahydrozoline (VISINE) 0.05 % ophthalmic solution Apply 1 drop to eye as needed. For red eyes      . traZODone (DESYREL) 100 MG tablet Take 100 mg by mouth at bedtime.        Allergies:   No Known Allergies  Social History:  The patient  reports that he has never smoked. He has never used smokeless tobacco. He reports that he does not drink alcohol or use illicit drugs.   ROS:  Please see the history of present illness.   He does snore.  He has daytime hypersomnolence.   All other systems reviewed and negative.   PHYSICAL EXAM: VS:  BP 132/86  Pulse 61  Ht 5\' 9"  (1.753 m)  Wt 298 lb (135.172 kg)  BMI 44.01 kg/m2  SpO2 99% Well nourished, well developed, in no acute distress HEENT: normal Neck: no JVD Vascular:  No carotid bruits Cardiac:  normal S1, S2; RRR; no murmur Lungs:  clear to auscultation bilaterally, no wheezing, rhonchi or rales Abd: soft, nontender, no hepatomegaly Ext: no  edema Skin: warm and dry Neuro:  CNs 2-12 intact, no focal abnormalities noted  EKG:  NSR, HR 61, normal axis, T-wave inversions in 3, aVF     ASSESSMENT AND PLAN:  1. Hypertension:  Better controlled. Check a basic metabolic panel today. 2. Abnormal ECG:  Proceed with exercise treadmill test. 3. Snoring:  Arrange split-night sleep study to assess for obstructive sleep apnea.  4. Disposition:  I will followup with him at the time of his treadmill test. If this is normal, he may followup as needed with cardiology (Dr. Rollene Rotunda).  Luna Glasgow, PA-C  10:59 AM 01/28/2013

## 2013-01-28 NOTE — Patient Instructions (Addendum)
LAB TODAY BMET  Your physician has requested that you have an exercise tolerance test. For further information please visit [email or web address] Please also follow instruction sheet, as given.  YOU ARE TO BE SCHEDULED FOR A SPLIT NIGHT STUDY; DX SNORING

## 2013-01-31 ENCOUNTER — Telehealth: Payer: Self-pay | Admitting: *Deleted

## 2013-01-31 NOTE — Telephone Encounter (Signed)
s/w pt. mother , advised that labs normal, verbalized understanding

## 2013-01-31 NOTE — Telephone Encounter (Signed)
Message copied by Tarri Fuller on Mon Jan 31, 2013 11:29 AM ------      Message from: VIA, PATRICIA M      Created: Fri Jan 28, 2013  4:12 PM       Will forward to nurse

## 2013-02-09 ENCOUNTER — Ambulatory Visit (INDEPENDENT_AMBULATORY_CARE_PROVIDER_SITE_OTHER): Payer: PRIVATE HEALTH INSURANCE | Admitting: Nurse Practitioner

## 2013-02-09 ENCOUNTER — Encounter: Payer: Self-pay | Admitting: Nurse Practitioner

## 2013-02-09 DIAGNOSIS — I10 Essential (primary) hypertension: Secondary | ICD-10-CM

## 2013-02-09 DIAGNOSIS — R9431 Abnormal electrocardiogram [ECG] [EKG]: Secondary | ICD-10-CM

## 2013-02-09 NOTE — Patient Instructions (Signed)
Keep monitoring your blood pressure  Walking every day and work on your weight  Dr. Antoine Poche will see you in 6 months  Follow up with Dr. Elmore Guise for your BP  Call the [facility] office at [phone] if you have any questions, problems or concerns.

## 2013-02-09 NOTE — Progress Notes (Signed)
Exercise Treadmill Test  Pre-Exercise Testing Evaluation Rhythm: normal sinus  Rate: 67                 Test  Exercise Tolerance Test Ordering MD: Melene Muller, MD  Interpreting MD: Norma Fredrickson, NP  Unique Test No: 1  Treadmill:  1  Indication for ETT: Abnormal EKG  Contraindication to ETT: No   Stress Modality: exercise - treadmill  Cardiac Imaging Performed: non   Protocol: standard Bruce - maximal  Max BP:  200/94  Max MPHR (bpm):  177 85% MPR (bpm):  150  MPHR obtained (bpm):  146 % MPHR obtained:  82%  Reached 85% MPHR (min:sec):  N/A Total Exercise Time (min-sec):  6:44  Workload in METS:  8.1 Borg Scale: 19  Reason ETT Terminated:  patient's desire to stop    ST Segment Analysis At Rest: Patient has resting abnormal EKG with  T wave changes inferolaterally.   With Exercise: no evidence of significant ST depression  Other Information Arrhythmia:  No Angina during ETT:  absent (0) Quality of ETT:  non-diagnostic  ETT Interpretation:  normal - no evidence of ischemia by ST analysis  Comments: Patient presents today for GXT. Has malignant HTN, obesity and mild LVH with grade 1 diastolic dysfunction. EF is 50 to 55%. Has had his BP treated. Has a resting abnormal EKG.   Today, he exercised on the standard Bruce protocol for a total of 6:44. He has poor exercise tolerance. BP response was adequate and only mildly hypertensive. Target heart rate of 150 was not achieved. Maximum heart rate was 146. No arrhythmia. Clinically negative. EKG with no significant ST depression noted.   Recommendations: Would recommend CV risk factor modification with diet/exercise/weight loss.  See his PCP for continued BP monitoring. We will see him back in 6 months. Patient is agreeable to this plan and will call if any problems develop in the interim.

## 2013-02-17 ENCOUNTER — Ambulatory Visit (HOSPITAL_BASED_OUTPATIENT_CLINIC_OR_DEPARTMENT_OTHER): Payer: PRIVATE HEALTH INSURANCE | Attending: Physician Assistant | Admitting: Radiology

## 2013-02-17 VITALS — Ht 69.0 in | Wt 298.0 lb

## 2013-02-17 DIAGNOSIS — G4733 Obstructive sleep apnea (adult) (pediatric): Secondary | ICD-10-CM | POA: Insufficient documentation

## 2013-02-17 DIAGNOSIS — R0683 Snoring: Secondary | ICD-10-CM

## 2013-03-01 DIAGNOSIS — G4730 Sleep apnea, unspecified: Secondary | ICD-10-CM

## 2013-03-01 DIAGNOSIS — G4710 Hypersomnia, unspecified: Secondary | ICD-10-CM

## 2013-03-02 NOTE — Procedures (Signed)
NAME:  [name], Michael SANFILIPPO NO.:  [id number]  MEDICAL RECORD NO.:  [id number]          PATIENT TYPE:  OUT  LOCATION:  SLEEP CENTER                 FACILITY[facility]  [facility]  PHYSICIAN:  Barbaraann Share, MD,FCCPDATE OF BIRTH:  02-15-69  DATE OF STUDY:  02/17/2013                           NOCTURNAL POLYSOMNOGRAM  REFERRING PHYSICIAN:  Tereso Newcomer, PA-C  INDICATION FOR STUDY:  Hypersomnia with sleep apnea.  EPWORTH SLEEPINESS SCORE:  11.  MEDICATIONS:  SLEEP ARCHITECTURE:  The patient had a total sleep time of 322 minutes with decreased slow wave sleep and only 40 minutes of REM.  Sleep onset latency was normal at 1 minute, and REM onset was prolonged at 116 minutes.  Sleep efficiency was adequate at 90%.  RESPIRATORY DATA:  The patient was found to have 67 obstructive apneas, 79 central apneas, and 29 obstructive hypopneas, giving him an AHI of 33 events per hour.  The events occurred in all body positions, and there was loud snoring noted throughout.  The patient did not meet split-night protocol, since the majority of his events occurring well after 1 a.m.  OXYGEN DATA:  The patient had oxygen desaturation as low as 85% with his obstructive and central events.  CARDIAC DATA:  No clinically significant arrhythmias were noted.  MOVEMENT-PARASOMNIA:  The patient had no significant leg jerks or other abnormal behavior seen.  IMPRESSION/RECOMMENDATION:  Moderate, obstructive, and central sleep apnea with an apnea hypopnea index of 33 events per hour and oxygen desaturation as low as 85%.  Treatment for this degree of sleep apnea can include a trial of weight loss alone, upper airway surgery, dental appliance, and also continuous positive airway pressure.  If the decision is made to treat the patient with continuous positive airway pressure, he may need a formal continuous positive airway pressure titration study given his complex apnea, and the risk of worsening  pressure-induced central events. Clinical correlation is suggested.     Barbaraann Share, MD,FCCP Diplomate, American Board of Sleep Medicine    [facility]/MEDQ  D:  03/01/2013 09:20:40  T:  03/02/2013 00:19:25  Job:  [id number]

## 2013-03-07 ENCOUNTER — Encounter: Payer: Self-pay | Admitting: Physician Assistant

## 2013-03-07 ENCOUNTER — Telehealth: Payer: Self-pay | Admitting: Physician Assistant

## 2013-03-07 NOTE — Addendum Note (Signed)
Addended by: Harriet Butte on: 03/07/2013 12:57 PM   Modules accepted: Orders

## 2013-03-07 NOTE — Telephone Encounter (Signed)
Sleep Study shows he has moderate sleep apnea. Please refer to Dr. Shelle Iron or Dr. Craige Cotta with pulmonology for evaluation and management of sleep apnea. Thanks, Tereso Newcomer, PA-C  8:59 AM 03/07/2013

## 2013-03-07 NOTE — Telephone Encounter (Signed)
Ambulatory referral order placed for pt with Dr Shelle Iron . Staff message send to the PCC's to schedule pt to be seen.

## 2013-03-15 ENCOUNTER — Institutional Professional Consult (permissible substitution): Payer: PRIVATE HEALTH INSURANCE | Admitting: Internal Medicine

## 2013-03-15 ENCOUNTER — Institutional Professional Consult (permissible substitution): Payer: PRIVATE HEALTH INSURANCE | Admitting: Pulmonary Disease

## 2013-04-06 ENCOUNTER — Ambulatory Visit (INDEPENDENT_AMBULATORY_CARE_PROVIDER_SITE_OTHER): Payer: Medicare Other | Admitting: Pulmonary Disease

## 2013-04-06 ENCOUNTER — Encounter: Payer: Self-pay | Admitting: Pulmonary Disease

## 2013-04-06 VITALS — HR 61 | Temp 97.3°F | Ht 69.0 in | Wt 305.8 lb

## 2013-04-06 DIAGNOSIS — G4733 Obstructive sleep apnea (adult) (pediatric): Secondary | ICD-10-CM

## 2013-04-06 NOTE — Assessment & Plan Note (Signed)
The patient has moderate obstructive sleep apnea by his recent sleep study, and appears to have both obstructive and central events.  I've had a long discussion with him about the pathophysiology of sleep apnea, including its impact to his cardiovascular health and quality of life.  His best treatment option is CPAP while he is working on weight loss, and the patient is agreeable to trying this.  We will have to be careful with adjusting his CPAP pressure, since this can cause pressure induced central apnea.  I have also encouraged him to work aggressively on weight loss.

## 2013-04-06 NOTE — Progress Notes (Signed)
Subjective:    Patient ID: Michael [name], male    DOB: 1971/02/11, 42 y.o.   MRN: [id number]  HPI The pt is a 42y/o male who I have been asked to see for management of OSA.  He has had a recent sleep study that showed moderate OSA, with an AHI of 33 of the per hour and oxygen desaturation as low as 85%.  He had both central and obstructive events during the night.  The patient has been noted to have loud snoring, as well as an abnormal breathing pattern during sleep.  He has frequent awakenings at night, and does not feel rested in the mornings upon arising.  He reports definite sleep pressured during the day with periods of inactivity, and can actually fall asleep according to his family.  He will also follow asleep watching television or movies in the evenings, but denies any sleepiness issues while driving.  Of note, the patient states his weight is up 60 pounds over the last 2 years, and his Epworth score today is 14.   Sleep Questionnaire What time do you typically go to bed?( Between what hours) 1am 1am at 1338 on 04/06/13 by Nita Sells, CMA How long does it take you to fall asleep?  at 1338 on 04/06/13 by Nita Sells, CMA How many times during the night do you wake up? 3 3 at 1338 on 04/06/13 by Nita Sells, CMA What time do you get out of bed to start your day? 0700 0700 at 1338 on 04/06/13 by Nita Sells, CMA Do you drive or operate heavy machinery in your occupation? No No at 1338 on 04/06/13 by Nita Sells, CMA How much has your weight changed (up or down) over the past two years? (In pounds) 40 lb (18.144 kg)40 lb (18.144 kg) 40lb increase at 1338 on 04/06/13 by Marjo Bicker Mabe, CMA Have you ever had a sleep study before? Yes Yes at 1338 on 04/06/13 by Nita Sells, CMA If yes, location of study? [place] at 1338 on 04/06/13 by Nita Sells, CMA If yes, date of study? 03/03/2013 03/03/2013 at 1338 on 04/06/13 by Nita Sells, CMA Do you  currently use CPAP? No No at 1338 on 04/06/13 by Marjo Bicker Mabe, CMA Do you wear oxygen at any time? No No at 1338 on 04/06/13 by Marjo Bicker Mabe, CMA   Review of Systems  Constitutional: Positive for unexpected weight change. Negative for fever.  HENT: Positive for dental problem. Negative for ear pain, nosebleeds, congestion, sore throat, rhinorrhea, sneezing, trouble swallowing, postnasal drip and sinus pressure.   Eyes: Negative for redness and itching.  Respiratory: Positive for cough ( at night) and shortness of breath. Negative for chest tightness and wheezing.   Cardiovascular: Positive for chest pain and palpitations ( irregular heartbeats). Negative for leg swelling.  Gastrointestinal: Negative for nausea and vomiting.  Genitourinary: Negative for dysuria.  Musculoskeletal: Negative for joint swelling.  Skin: Negative for rash.  Neurological: Positive for headaches.  Hematological: Does not bruise/bleed easily.  Psychiatric/Behavioral: Negative for dysphoric mood. The patient is not nervous/anxious.        Objective:   Physical Exam Constitutional:  Obese male, no acute distress  HENT:  Nares patent without discharge  Oropharynx without exudate, palate and uvula are thick and elongated, moderate tonsillar hypertrophy  Eyes:  Perrla, eomi, no scleral icterus  Neck:  No JVD, no TMG  Cardiovascular:  Normal rate, regular rhythm,  no rubs or gallops.  No murmurs        Intact distal pulses  Pulmonary :  Normal breath sounds, no stridor or respiratory distress   No rales, rhonchi, or wheezing  Abdominal:  Soft, nondistended, bowel sounds present.  No tenderness noted.   Musculoskeletal:  No lower extremity edema noted.  Lymph Nodes:  No cervical lymphadenopathy noted  Skin:  No cyanosis noted  Neurologic:  Alert, appropriate, moves all 4 extremities without obvious deficit.         Assessment & Plan:

## 2013-04-06 NOTE — Patient Instructions (Addendum)
Will start you on cpap at a moderate pressure level.  Let me know if you are having issues with tolerance. Work on weight loss followup with me in 6 weeks.

## 2013-05-07 ENCOUNTER — Ambulatory Visit (INDEPENDENT_AMBULATORY_CARE_PROVIDER_SITE_OTHER): Payer: 59 | Admitting: Physician Assistant

## 2013-05-07 VITALS — BP 162/130 | HR 88 | Temp 98.4°F | Resp 18 | Wt 302.0 lb

## 2013-05-07 DIAGNOSIS — Z131 Encounter for screening for diabetes mellitus: Secondary | ICD-10-CM

## 2013-05-07 DIAGNOSIS — I10 Essential (primary) hypertension: Secondary | ICD-10-CM

## 2013-05-07 LAB — COMPREHENSIVE METABOLIC PANEL
Albumin: 4.1 g/dL (ref 3.5–5.2)
Alkaline Phosphatase: 68 U/L (ref 39–117)
BUN: 17 mg/dL (ref 6–23)
Glucose, Bld: 106 mg/dL — ABNORMAL HIGH (ref 70–99)
Potassium: 3.5 mEq/L (ref 3.5–5.3)
Total Bilirubin: 0.5 mg/dL (ref 0.3–1.2)

## 2013-05-07 MED ORDER — BLOOD PRESSURE MONITOR AUTOMAT DEVI: 1.0000 [IU] | Freq: Every day | Status: DC

## 2013-05-07 MED ORDER — BISOPROLOL FUMARATE 10 MG PO TABS: 10.0000 mg | ORAL_TABLET | Freq: Every day | ORAL | Status: DC

## 2013-05-07 MED ORDER — AMLODIPINE BESYLATE 10 MG PO TABS: 10.0000 mg | ORAL_TABLET | Freq: Every day | ORAL | Status: DC

## 2013-05-07 MED ORDER — LISINOPRIL 20 MG PO TABS: 40.0000 mg | ORAL_TABLET | Freq: Every day | ORAL | Status: DC

## 2013-05-07 NOTE — Progress Notes (Signed)
   [address] [place] [address]   Phone [phone]  Subjective:    Patient ID: Michael [name], male    DOB: [birth year], 42 y.o.   MRN: [id number]  HPI  Pt presents to clinic with feeling bad for the last 2 days.  He started a new job yesterday and had a bad day.  He did not feel well yesterday and had a stressful day at work.  He is a Public affairs consultant at [employer]. Yesterday he felt lightheaded and had 2 episodes of vomiting.  Then he felt like his legs were swelling.  He tried to go to work today but his boss made him really nervous so he left to come here.  He has been off his BP meds for about 2 days.  He has not been checking his BP - he will sometimes use him moms cuff.  Pt is seen at [facility] for his anxiety and depression - he was seen last month and has an appt in 2 months again.  Review of Systems  Constitutional: Negative for fever and chills.  Cardiovascular: Positive for leg swelling. Negative for chest pain.  Gastrointestinal: Positive for vomiting (2 episodes yesterday). Negative for nausea and diarrhea.  Musculoskeletal: Positive for myalgias.  Neurological: Positive for light-headedness.       Objective:   Physical Exam  Vitals reviewed. Constitutional: He is oriented to person, place, and time. He appears well-developed and well-nourished.  HENT:  Head: Normocephalic and atraumatic.  Right Ear: External ear normal.  Left Ear: External ear normal.  Eyes: Conjunctivae are normal.  Cardiovascular: Normal rate, regular rhythm, normal heart sounds and intact distal pulses.   No murmur heard. No LE edema.  Pulmonary/Chest: Effort normal and breath sounds normal.  Neurological: He is alert and oriented to person, place, and time.  Skin: Skin is warm and dry.  Psychiatric: He has a normal mood and affect. His behavior is normal. Judgment and thought content normal.   Results for orders placed in visit on 05/07/13  GLUCOSE, POCT (MANUAL RESULT ENTRY)      Result Value  Range   POC Glucose 114 (*) 70 - 99 mg/dl       Assessment & Plan:  Essential hypertension, benign - D/w pt the importance of staying on BP meds - That is probably the reason that pt has felt so bad.  D/w pt that the stress at work may be aggravating his BP, hopefully as he becomes used to the job the stress will get better - Plan: Comprehensive metabolic panel, lisinopril (PRINIVIL,ZESTRIL) 20 MG tablet, bisoprolol (ZEBETA) 10 MG tablet, amLODipine (NORVASC) 10 MG tablet, Blood Pressure Monitoring (BLOOD PRESSURE MONITOR AUTOMAT) DEVI  Screening for diabetes mellitus (DM) - Plan: POCT glucose (manual entry)  Recheck in 1 week due to uncontrolled BP today.  Benny Lennert PA-C 05/07/2013 9:45 AM

## 2013-05-09 ENCOUNTER — Encounter: Payer: Self-pay | Admitting: *Deleted

## 2013-05-11 ENCOUNTER — Ambulatory Visit (INDEPENDENT_AMBULATORY_CARE_PROVIDER_SITE_OTHER): Payer: 59 | Admitting: Emergency Medicine

## 2013-05-11 VITALS — BP 142/98 | HR 65 | Temp 98.2°F | Resp 18 | Ht 70.0 in | Wt 302.0 lb

## 2013-05-11 DIAGNOSIS — I10 Essential (primary) hypertension: Secondary | ICD-10-CM

## 2013-05-11 DIAGNOSIS — R739 Hyperglycemia, unspecified: Secondary | ICD-10-CM

## 2013-05-11 NOTE — Patient Instructions (Addendum)

## 2013-05-11 NOTE — Progress Notes (Signed)
[facility] [address] [place] [address] [phone]  Date:  05/11/2013   Name:  Michael [name]   DOB:  01/05/1971   MRN:  [id number]  PCP:  Shade Flood, MD    Chief Complaint: Hypertension   History of Present Illness:  Michael [name] is a 42 y.o. very pleasant male patient who presents with the following:  History of hypertension.  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  Currently his symptoms (headache) have improved.  Says that he is under a great deal of stress at work and he attributes  His blood pressure problem to the stress.  He has a very poor concept of the cause of his hypertension.   Understands the dietary measures to assist in control of his pressure.   Wants to change his job due to the stress.  No improvement with over the counter medications or other home remedies. Denies other complaint or health concern today.   Patient Active Problem List   Diagnosis Date Noted  . OSA (obstructive sleep apnea) 04/06/2013  . Abnormal EKG 11/08/2012  . Hypertension   . Noncompliance   . HTN (hypertension), malignant 04/29/2012  . Migraine headache with aura 04/29/2012    Past Medical History  Diagnosis Date  . Hypertension   . Migraine   . Depression     Questionable per records. Not on any medication.   . Schizophrenia     Questionable per records. Not on any medication.   . Noncompliance   . Anxiety   . Insomnia   . Hx of echocardiogram     a. Echo 12/13/12: Mild LVH, EF 50-55%, grade 1 diastolic dysfunction, mild LAE, mild RVE, mild RAE  . OSA (obstructive sleep apnea)     a. Sleep Study 02/2013:  mod OSA, AHI 33 per hour, O2 sat nadir 85%    Past Surgical History  Procedure Laterality Date  . Mandible surgery  1998    metal plate  . Admission  04/2012    Malignant HTN, HA.  CT head negative, cardiology consult.      History  Substance Use Topics  . Smoking status: Never Smoker   . Smokeless tobacco: Never Used  .  Alcohol Use: No    Family History  Problem Relation Age of Onset  . Cancer Father     prostate, colon- age was in his 59's  . Heart disease Father 107    MI   . Hypertension Father   . Cancer Maternal Grandfather   . Cancer Paternal Grandfather   . Diabetes Mother   . Hypertension Mother   . Hypertension Brother     No Known Allergies  Medication list has been reviewed and updated.  Current Outpatient Prescriptions on File Prior to Visit  Medication Sig Dispense Refill  . amLODipine (NORVASC) 10 MG tablet Take 1 tablet (10 mg total) by mouth daily.  30 tablet  0  . bisoprolol (ZEBETA) 10 MG tablet Take 1 tablet (10 mg total) by mouth daily.  30 tablet  0  . cyclobenzaprine (FLEXERIL) 5 MG tablet 1 to 2 pills ar bedtime as needed for jaw pain.  15 tablet  0  . lisinopril (PRINIVIL,ZESTRIL) 20 MG tablet Take 2 tablets (40 mg total) by mouth daily.  60 tablet  0  . sertraline (ZOLOFT) 100 MG tablet Take 150 mg by mouth daily.      . traZODone (DESYREL) 100 MG tablet Take 100  mg by mouth at bedtime.      . Blood Pressure Monitoring (BLOOD PRESSURE MONITOR AUTOMAT) DEVI 1 Units by Does not apply route daily.  1 Device  0   No current facility-administered medications on file prior to visit.    Review of Systems:  As per HPI, otherwise negative.    Physical Examination: Filed Vitals:   05/11/13 1322  BP: 142/98  Pulse: 65  Temp: 98.2 F (36.8 C)  Resp: 18   Filed Vitals:   05/11/13 1322  Height: 5\' 10"  (1.778 m)  Weight: 302 lb (136.986 kg)   Body mass index is 43.33 kg/(m^2). Ideal Body Weight: Weight in (lb) to have BMI = 25: 173.9  GEN: WDWN, NAD, Non-toxic, A & O x 3 HEENT: Atraumatic, Normocephalic. Neck supple. No masses, No LAD. Ears and Nose: No external deformity. CV: RRR, No M/G/R. No JVD. No thrill. No extra heart sounds. PULM: CTA B, no wheezes, crackles, rhonchi. No retractions. No resp. distress. No accessory muscle use. ABD: S, NT, ND, +BS. No  rebound. No HSM. EXTR: No c/c/e NEURO Normal gait.  PSYCH: Normally interactive. Conversant. Not depressed or anxious appearing.  Calm demeanor.    Assessment and Plan: Hypertension Continue medication Follow up in one month    Signed,  Phillips Odor, MD

## 2013-05-13 NOTE — Progress Notes (Signed)
Reviewed and agree.

## 2013-05-18 ENCOUNTER — Ambulatory Visit: Payer: Medicare Other | Admitting: Pulmonary Disease

## 2013-05-26 ENCOUNTER — Ambulatory Visit (INDEPENDENT_AMBULATORY_CARE_PROVIDER_SITE_OTHER): Payer: 59 | Admitting: Emergency Medicine

## 2013-05-26 VITALS — BP 142/92 | HR 82 | Temp 97.5°F | Resp 16 | Ht 69.25 in | Wt 301.6 lb

## 2013-05-26 DIAGNOSIS — Z800 Family history of malignant neoplasm of digestive organs: Secondary | ICD-10-CM

## 2013-05-26 DIAGNOSIS — K921 Melena: Secondary | ICD-10-CM

## 2013-05-26 LAB — POCT CBC
HCT, POC: 43.6 % (ref 43.5–53.7)
Lymph, poc: 2.9 (ref 0.6–3.4)
MCH, POC: 28.9 pg (ref 27–31.2)
MCHC: 32.1 g/dL (ref 31.8–35.4)
POC Granulocyte: 2.3 (ref 2–6.9)
POC LYMPH PERCENT: 53.3 %L — AB (ref 10–50)
RDW, POC: 14.9 %
WBC: 5.5 10*3/uL (ref 4.6–10.2)

## 2013-05-26 LAB — IFOBT (OCCULT BLOOD): IFOBT: NEGATIVE

## 2013-05-26 MED ORDER — HYDROCORTISONE 1 % EX CREA: TOPICAL_CREAM | Freq: Two times a day (BID) | CUTANEOUS | Status: DC

## 2013-05-26 NOTE — Progress Notes (Signed)
  Subjective:    Patient ID: Michael [name], male    DOB: 22-Oct-1971, 42 y.o.   MRN: [id number]  HPI who presents with onset 2 days ago of bright red blood after having a bowel movement. He has some mild pain around the anal area. Yesterday he was okay but then again this morning when he had a bowel movement there is blood again. Is also blood on the toilet paper. He has had no abdominal pain he has had no change in bowel habits he has had no nausea or vomiting. He does have a family history of colon cancer.    Review of Systems     Objective:   Physical Exam the abdomen is obese the liver and spleen are not enlarged. Rectal exam reveals some mild tenderness at 6:00. The anal area was difficult to visualize due to the patient's size. Blood obtained on the exam glove was brown.  Results for orders placed in visit on 05/26/13  POCT CBC      Result Value Range   WBC 5.5  4.6 - 10.2 K/uL   Lymph, poc 2.9  0.6 - 3.4   POC LYMPH PERCENT 53.3 (*) 10 - 50 %L   MID (cbc) 0.3  0 - 0.9   POC MID % 5.7  0 - 12 %M   POC Granulocyte 2.3  2 - 6.9   Granulocyte percent 41.0  37 - 80 %G   RBC 4.84  4.69 - 6.13 M/uL   Hemoglobin 14.0 (*) 14.1 - 18.1 g/dL   HCT, POC 40.9  81.1 - 53.7 %   MCV 90.0  80 - 97 fL   MCH, POC 28.9  27 - 31.2 pg   MCHC 32.1  31.8 - 35.4 g/dL   RDW, POC 91.4     Platelet Count, POC 213  142 - 424 K/uL   MPV 10.0  0 - 99.8 fL  IFOBT (OCCULT BLOOD)      Result Value Range   IFOBT Negative          Assessment & Plan:  History of bright red blood per rectum with family history of colon cancer. Refer to GI check CBC and hemosure. Hemosure  negative will treat with Anusol-HC cream and refer to GI.

## 2013-05-26 NOTE — Patient Instructions (Addendum)
Hemorrhoids Hemorrhoids are swollen veins around the rectum or anus. There are two types of hemorrhoids:   Internal hemorrhoids. These occur in the veins just inside the rectum. They may poke through to the outside and become irritated and painful.  External hemorrhoids. These occur in the veins outside the anus and can be felt as a painful swelling or hard lump near the anus. CAUSES  Pregnancy.   Obesity.   Constipation or diarrhea.   Straining to have a bowel movement.   Sitting for long periods on the toilet.  Heavy lifting or other activity that caused you to strain.  Anal intercourse. SYMPTOMS   Pain.   Anal itching or irritation.   Rectal bleeding.   Fecal leakage.   Anal swelling.   One or more lumps around the anus.  DIAGNOSIS  Your caregiver may be able to diagnose hemorrhoids by visual examination. Other examinations or tests that may be performed include:   Examination of the rectal area with a gloved hand (digital rectal exam).   Examination of anal canal using a small tube (scope).   A blood test if you have lost a significant amount of blood.  A test to look inside the colon (sigmoidoscopy or colonoscopy). TREATMENT Most hemorrhoids can be treated at home. However, if symptoms do not seem to be getting better or if you have a lot of rectal bleeding, your caregiver may perform a procedure to help make the hemorrhoids get smaller or remove them completely. Possible treatments include:   Placing a rubber band at the base of the hemorrhoid to cut off the circulation (rubber band ligation).   Injecting a chemical to shrink the hemorrhoid (sclerotherapy).   Using a tool to burn the hemorrhoid (infrared light therapy).   Surgically removing the hemorrhoid (hemorrhoidectomy).   Stapling the hemorrhoid to block blood flow to the tissue (hemorrhoid stapling).  HOME CARE INSTRUCTIONS   Eat foods with fiber, such as whole grains, beans,  nuts, fruits, and vegetables. Ask your doctor about taking products with added fiber in them (fibersupplements).  Increase fluid intake. Drink enough water and fluids to keep your urine clear or pale yellow.   Exercise regularly.   Go to the bathroom when you have the urge to have a bowel movement. Do not wait.   Avoid straining to have bowel movements.   Keep the anal area dry and clean. Use wet toilet paper or moist towelettes after a bowel movement.   Medicated creams and suppositories may be used or applied as directed.   Only take over-the-counter or prescription medicines as directed by your caregiver.   Take warm sitz baths for 15 20 minutes, 3 4 times a day to ease pain and discomfort.   Place ice packs on the hemorrhoids if they are tender and swollen. Using ice packs between sitz baths may be helpful.   Put ice in a plastic bag.   Place a towel between your skin and the bag.   Leave the ice on for 15 20 minutes, 3 4 times a day.   Do not use a donut-shaped pillow or sit on the toilet for long periods. This increases blood pooling and pain.  SEEK MEDICAL CARE IF:  You have increasing pain and swelling that is not controlled by treatment or medicine.  You have uncontrolled bleeding.  You have difficulty or you are unable to have a bowel movement.  You have pain or inflammation outside the area of the hemorrhoids. MAKE SURE YOU:    Understand these instructions.  Will watch your condition.  Will get help right away if you are not doing well or get worse. Document Released: 12/12/2000 Document Revised: 12/01/2012 Document Reviewed: 10/19/2012 Exit[employer] Patient Information 2014 [employer].  

## 2013-07-13 ENCOUNTER — Other Ambulatory Visit: Payer: Self-pay | Admitting: Physician Assistant

## 2013-08-12 ENCOUNTER — Ambulatory Visit: Payer: Medicare Other | Admitting: Cardiology

## 2013-08-16 ENCOUNTER — Encounter: Payer: Self-pay | Admitting: Gastroenterology

## 2013-09-22 ENCOUNTER — Ambulatory Visit (INDEPENDENT_AMBULATORY_CARE_PROVIDER_SITE_OTHER): Payer: 59 | Admitting: Family Medicine

## 2013-09-22 VITALS — BP 142/90 | HR 92 | Temp 98.4°F | Resp 18 | Ht 69.0 in | Wt 305.0 lb

## 2013-09-22 DIAGNOSIS — I10 Essential (primary) hypertension: Secondary | ICD-10-CM

## 2013-09-22 DIAGNOSIS — E669 Obesity, unspecified: Secondary | ICD-10-CM

## 2013-09-22 DIAGNOSIS — Z23 Encounter for immunization: Secondary | ICD-10-CM

## 2013-09-22 LAB — BASIC METABOLIC PANEL
CO2: 29 mEq/L (ref 19–32)
Calcium: 8.9 mg/dL (ref 8.4–10.5)
Creat: 0.97 mg/dL (ref 0.50–1.35)
Glucose, Bld: 95 mg/dL (ref 70–99)

## 2013-09-22 MED ORDER — LISINOPRIL 40 MG PO TABS: ORAL_TABLET | ORAL | Status: DC

## 2013-09-22 MED ORDER — AMLODIPINE BESYLATE 10 MG PO TABS: ORAL_TABLET | ORAL | Status: DC

## 2013-09-22 MED ORDER — BISOPROLOL FUMARATE 10 MG PO TABS: ORAL_TABLET | ORAL | Status: DC

## 2013-09-22 NOTE — Progress Notes (Signed)
[facility] [address], [place] [address] [phone]  Date:  09/22/2013   Name:  Michael [name]   DOB:  1971-10-07   MRN:  [id number]  PCP:  Shade Flood, MD    Chief Complaint: rx refills   History of Present Illness:  Michael [name] is a 42 y.o. very pleasant male patient who presents with the following:  Here today for a medication refill.   He needs his BP refills today.  He does check his BP at home- he might get 130/90 on average He is otherwise feeling well, he does note some pain in his lower back when he exercises.   He is willing to get a flu shot today  Patient Active Problem List   Diagnosis Date Noted  . OSA (obstructive sleep apnea) 04/06/2013  . Abnormal EKG 11/08/2012  . Hypertension   . Noncompliance   . HTN (hypertension), malignant 04/29/2012  . Migraine headache with aura 04/29/2012    Past Medical History  Diagnosis Date  . Hypertension   . Migraine   . Depression     Questionable per records. Not on any medication.   . Schizophrenia     Questionable per records. Not on any medication.   . Noncompliance   . Anxiety   . Insomnia   . Hx of echocardiogram     a. Echo 12/13/12: Mild LVH, EF 50-55%, grade 1 diastolic dysfunction, mild LAE, mild RVE, mild RAE  . OSA (obstructive sleep apnea)     a. Sleep Study 02/2013:  mod OSA, AHI 33 per hour, O2 sat nadir 85%    Past Surgical History  Procedure Laterality Date  . Mandible surgery  1998    metal plate  . Admission  04/2012    Malignant HTN, HA.  CT head negative, cardiology consult.      History  Substance Use Topics  . Smoking status: Never Smoker   . Smokeless tobacco: Never Used  . Alcohol Use: No    Family History  Problem Relation Age of Onset  . Cancer Father     prostate, colon- age was in his 98's  . Heart disease Father 64    MI   . Hypertension Father   . Cancer Maternal Grandfather   . Cancer Paternal Grandfather   . Diabetes Mother   .  Hypertension Mother   . Hypertension Brother     No Known Allergies  Medication list has been reviewed and updated.  Current Outpatient Prescriptions on File Prior to Visit  Medication Sig Dispense Refill  . amLODipine (NORVASC) 10 MG tablet TAKE 1 TABLET BY MOUTH DAILY  30 tablet  0  . bisoprolol (ZEBETA) 10 MG tablet TAKE 1 TABLET BY MOUTH DAILY  30 tablet  0  . Blood Pressure Monitoring (BLOOD PRESSURE MONITOR AUTOMAT) DEVI 1 Units by Does not apply route daily.  1 Device  0  . cyclobenzaprine (FLEXERIL) 5 MG tablet 1 to 2 pills ar bedtime as needed for jaw pain.  15 tablet  0  . lisinopril (PRINIVIL,ZESTRIL) 20 MG tablet TAKE 2 TABLETS BY MOUTH DAILY  60 tablet  0  . sertraline (ZOLOFT) 100 MG tablet Take 150 mg by mouth daily.      . traZODone (DESYREL) 100 MG tablet Take 100 mg by mouth at bedtime.      . hydrocortisone cream 1 % Apply topically 2 (two) times daily.  30 g  0   No current facility-administered  medications on file prior to visit.    Review of Systems:  As per HPI- otherwise negative.   Physical Examination: Filed Vitals:   09/22/13 0909  BP: 142/90  Pulse: 92  Temp: 98.4 F (36.9 C)  Resp: 18   Filed Vitals:   09/22/13 0909  Height: 5\' 9"  (1.753 m)  Weight: 305 lb (138.347 kg)   Body mass index is 45.02 kg/(m^2). Ideal Body Weight: Weight in (lb) to have BMI = 25: 168.9  GEN: WDWN, NAD, Non-toxic, A & O x 3, obese HEENT: Atraumatic, Normocephalic. Neck supple. No masses, No LAD. Ears and Nose: No external deformity. CV: RRR, No M/G/R. No JVD. No thrill. No extra heart sounds. PULM: CTA B, no wheezes, crackles, rhonchi. No retractions. No resp. distress. No accessory muscle use. ABD: S, NT, ND. No rebound. No HSM. EXTR: No c/c/e NEURO Normal gait.  PSYCH: Normally interactive. Conversant. Not depressed or anxious appearing.  Calm demeanor.  Mild tenderness in muscles of his lower back with palpation   Assessment and Plan: HTN (hypertension)  - Plan: amLODipine (NORVASC) 10 MG tablet, bisoprolol (ZEBETA) 10 MG tablet, lisinopril (PRINIVIL,ZESTRIL) 40 MG tablet, Basic metabolic panel, Lipid panel  Obesity, unspecified  Refilled  BP, encouraged him to continue to follow this at home.   Will plan further follow- up pending labs. Flu shot today Encouraged further weight loss to help protect his back.   HTN- refilled medication and labs today Encouraged weight loss Flu shot today  Signed Abbe Amsterdam, MD

## 2013-09-22 NOTE — Patient Instructions (Addendum)
I will be in touch with your labs once they come back.  Please continue to work on weight loss through diet and exercise.

## 2013-09-24 ENCOUNTER — Emergency Department (HOSPITAL_COMMUNITY)
Admission: EM | Admit: 2013-09-24 | Discharge: 2013-09-25 | Disposition: A | Discharge status: Home / Self Care | Payer: Medicare Other | Attending: Emergency Medicine | Admitting: Emergency Medicine

## 2013-09-24 ENCOUNTER — Encounter (HOSPITAL_COMMUNITY): Payer: Self-pay | Admitting: Emergency Medicine

## 2013-09-24 ENCOUNTER — Encounter: Payer: Self-pay | Admitting: Family Medicine

## 2013-09-24 DIAGNOSIS — F329 Major depressive disorder, single episode, unspecified: Secondary | ICD-10-CM | POA: Insufficient documentation

## 2013-09-24 DIAGNOSIS — Z9189 Other specified personal risk factors, not elsewhere classified: Secondary | ICD-10-CM | POA: Insufficient documentation

## 2013-09-24 DIAGNOSIS — F411 Generalized anxiety disorder: Secondary | ICD-10-CM | POA: Insufficient documentation

## 2013-09-24 DIAGNOSIS — Z91199 Patient's noncompliance with other medical treatment and regimen due to unspecified reason: Secondary | ICD-10-CM | POA: Insufficient documentation

## 2013-09-24 DIAGNOSIS — G4733 Obstructive sleep apnea (adult) (pediatric): Secondary | ICD-10-CM | POA: Insufficient documentation

## 2013-09-24 DIAGNOSIS — F3289 Other specified depressive episodes: Secondary | ICD-10-CM | POA: Insufficient documentation

## 2013-09-24 DIAGNOSIS — Z9119 Patient's noncompliance with other medical treatment and regimen: Secondary | ICD-10-CM | POA: Insufficient documentation

## 2013-09-24 DIAGNOSIS — Z79899 Other long term (current) drug therapy: Secondary | ICD-10-CM | POA: Insufficient documentation

## 2013-09-24 DIAGNOSIS — I10 Essential (primary) hypertension: Secondary | ICD-10-CM | POA: Insufficient documentation

## 2013-09-24 DIAGNOSIS — G4700 Insomnia, unspecified: Secondary | ICD-10-CM | POA: Insufficient documentation

## 2013-09-24 DIAGNOSIS — Z8669 Personal history of other diseases of the nervous system and sense organs: Secondary | ICD-10-CM | POA: Insufficient documentation

## 2013-09-24 DIAGNOSIS — F209 Schizophrenia, unspecified: Secondary | ICD-10-CM | POA: Insufficient documentation

## 2013-09-24 LAB — POCT I-STAT, CHEM 8
BUN: 10 mg/dL (ref 6–23)
Calcium, Ion: 1.13 mmol/L (ref 1.12–1.23)
Chloride: 104 mEq/L (ref 96–112)
Creatinine, Ser: 1 mg/dL (ref 0.50–1.35)
Glucose, Bld: 104 mg/dL — ABNORMAL HIGH (ref 70–99)
Potassium: 3.6 mEq/L (ref 3.5–5.1)
Sodium: 142 mEq/L (ref 135–145)

## 2013-09-24 MED ORDER — CLONIDINE HCL 0.1 MG PO TABS
0.1000 mg | ORAL_TABLET | Freq: Once | ORAL | Status: AC
  Administered 2013-09-24: 0.1 mg via ORAL
  Filled 2013-09-24: qty 1

## 2013-09-24 MED ORDER — OXYCODONE-ACETAMINOPHEN 5-325 MG PO TABS
2.0000 | ORAL_TABLET | Freq: Once | ORAL | Status: AC
  Administered 2013-09-24: 2 via ORAL
  Filled 2013-09-24: qty 2

## 2013-09-24 MED ORDER — CLONIDINE HCL 0.2 MG PO TABS
0.2000 mg | ORAL_TABLET | Freq: Once | ORAL | Status: AC
  Administered 2013-09-24: 0.2 mg via ORAL
  Filled 2013-09-24: qty 1

## 2013-09-24 NOTE — ED Notes (Signed)
Blanket provided and room darkened for pt comfort

## 2013-09-24 NOTE — ED Notes (Signed)
Family member at bedside.

## 2013-09-24 NOTE — Discharge Instructions (Signed)
Hypertension Hypertension is another name for high blood pressure. High blood pressure may mean that your heart needs to work harder to pump blood. Blood pressure consists of two numbers, which includes a higher number over a lower number (example: 110/72). HOME CARE   Make lifestyle changes as told by your doctor. This may include weight loss and exercise.  Take your blood pressure medicine every day.  Limit how much salt you use.  Stop smoking if you smoke.  Do not use drugs.  Talk to your doctor if you are using decongestants or birth control pills. These medicines might make blood pressure higher.  Females should not drink more than 1 alcoholic drink per day. Males should not drink more than 2 alcoholic drinks per day.  See your doctor as told. GET HELP RIGHT AWAY IF:   You have a blood pressure reading with a top number of 180 or higher.  You get a very bad headache.  You get blurred or changing vision.  You feel confused.  You feel weak, numb, or faint.  You get chest or belly (abdominal) pain.  You throw up (vomit).  You cannot breathe very well. MAKE SURE YOU:   Understand these instructions.  Will watch your condition.  Will get help right away if you are not doing well or get worse. Document Released: 06/02/2008 Document Revised: 03/08/2012 Document Reviewed: 06/02/2008 Exit[employer] Patient Information 2014 [employer].  

## 2013-09-24 NOTE — ED Notes (Signed)
MD at bedside. 

## 2013-09-24 NOTE — ED Provider Notes (Signed)
CSN: [id number]     Arrival date & time 09/24/13  1948 History   First MD Initiated Contact with Patient 09/24/13 1958     Chief Complaint  Patient presents with  . Headache  . Hypertension    HPI Pt. reports headache for 3 days with elevated blood pressure . Denies nausea or vomitting . Patient just got his medicines refilled after being off them for a month and a half.  Started in yesterday.  Has had a slow developing headache and been taking Tylenol with no relief.  Denies fever chills or neck pain.  Past Medical History  Diagnosis Date  . Hypertension   . Migraine   . Depression     Questionable per records. Not on any medication.   . Schizophrenia     Questionable per records. Not on any medication.   . Noncompliance   . Anxiety   . Insomnia   . Hx of echocardiogram     a. Echo 12/13/12: Mild LVH, EF 50-55%, grade 1 diastolic dysfunction, mild LAE, mild RVE, mild RAE  . OSA (obstructive sleep apnea)     a. Sleep Study 02/2013:  mod OSA, AHI 33 per hour, O2 sat nadir 85%   Past Surgical History  Procedure Laterality Date  . Mandible surgery  1998    metal plate  . Admission  04/2012    Malignant HTN, HA.  CT head negative, cardiology consult.     Family History  Problem Relation Age of Onset  . Cancer Father     prostate, colon- age was in his 25's  . Heart disease Father 7    MI   . Hypertension Father   . Cancer Maternal Grandfather   . Cancer Paternal Grandfather   . Diabetes Mother   . Hypertension Mother   . Hypertension Brother    History  Substance Use Topics  . Smoking status: Never Smoker   . Smokeless tobacco: Never Used  . Alcohol Use: No    Review of Systems All other systems reviewed and are negative Allergies  Review of patient's allergies indicates no known allergies.  Home Medications   Current Outpatient Rx  Name  Route  Sig  Dispense  Refill  . amLODipine (NORVASC) 10 MG tablet   Oral   Take 10 mg by mouth daily.         .  bisoprolol (ZEBETA) 10 MG tablet   Oral   Take 10 mg by mouth daily.         Marland Kitchen lisinopril (PRINIVIL,ZESTRIL) 40 MG tablet   Oral   Take 40 mg by mouth daily.          BP 140/100  Pulse 78  Temp(Src) 98.1 F (36.7 C) (Oral)  Resp 20  SpO2 98% Physical Exam  Nursing note and vitals reviewed. Constitutional: He is oriented to person, place, and time. He appears well-developed and well-nourished. No distress.  HENT:  Head: Normocephalic and atraumatic.  Eyes: Pupils are equal, round, and reactive to light.  Fundoscopic exam:      The right eye shows no hemorrhage and no papilledema.       The left eye shows no hemorrhage and no papilledema.  Neck: Normal range of motion.  Cardiovascular: Normal rate and intact distal pulses.   Pulmonary/Chest: No respiratory distress.  Abdominal: Normal appearance. He exhibits no distension.  Musculoskeletal: Normal range of motion.  Neurological: He is alert and oriented to person, place, and time. He has normal  strength. No cranial nerve deficit or sensory deficit. He displays a negative Romberg sign. GCS eye subscore is 4. GCS verbal subscore is 5. GCS motor subscore is 6.  Skin: Skin is warm and dry. No rash noted.  Psychiatric: He has a normal mood and affect. His behavior is normal.    ED Course  Procedures (including critical care time) Medications  cloNIDine (CATAPRES) tablet 0.2 mg (0.2 mg Oral Given 09/24/13 2117)  cloNIDine (CATAPRES) tablet 0.1 mg (0.1 mg Oral Given 09/24/13 2153)  oxyCODONE-acetaminophen (PERCOCET/ROXICET) 5-325 MG per tablet 2 tablet (2 tablets Oral Given 09/24/13 2219)    Labs Review Labs Reviewed  POCT I-STAT, CHEM 8 - Abnormal; Notable for the following:    Glucose, Bld 104 (*)    All other components within normal limits   Imaging Review No results found.  MDM   1. Hypertension    After treatment in the ED the patient feels back to baseline and wants to go home.    Nelia Shi,  MD 09/24/13 [phone]

## 2013-09-24 NOTE — ED Notes (Signed)
States that his headache has just started to fade

## 2013-09-24 NOTE — ED Notes (Signed)
Pt. reports headache for 3 days with elevated blood pressure . Denies nausea or vomitting .

## 2013-09-24 NOTE — Addendum Note (Signed)
Addended by: Abbe Amsterdam C on: 09/24/2013 03:57 PM   Modules accepted: Orders

## 2013-12-05 ENCOUNTER — Ambulatory Visit (INDEPENDENT_AMBULATORY_CARE_PROVIDER_SITE_OTHER): Payer: 59 | Admitting: Family Medicine

## 2013-12-05 VITALS — BP 120/98 | HR 62 | Temp 98.1°F | Resp 16 | Ht 69.5 in | Wt 303.0 lb

## 2013-12-05 DIAGNOSIS — R6884 Jaw pain: Secondary | ICD-10-CM

## 2013-12-05 DIAGNOSIS — M26609 Unspecified temporomandibular joint disorder, unspecified side: Secondary | ICD-10-CM

## 2013-12-05 DIAGNOSIS — I10 Essential (primary) hypertension: Secondary | ICD-10-CM

## 2013-12-05 MED ORDER — CYCLOBENZAPRINE HCL 10 MG PO TABS: ORAL_TABLET | ORAL | Status: DC

## 2013-12-05 NOTE — Patient Instructions (Signed)
Use the flexeril as needed for jaw pain.  Remember to try a 1/2 tablet first as this may be enough to control your symptoms.
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
## 2014-01-30 ENCOUNTER — Ambulatory Visit (INDEPENDENT_AMBULATORY_CARE_PROVIDER_SITE_OTHER): Payer: 59 | Admitting: Emergency Medicine

## 2014-01-30 VITALS — BP 170/120 | HR 61 | Temp 98.1°F | Resp 16 | Ht 69.5 in | Wt 306.0 lb

## 2014-01-30 DIAGNOSIS — I10 Essential (primary) hypertension: Secondary | ICD-10-CM

## 2014-01-30 MED ORDER — HYDROCHLOROTHIAZIDE 25 MG PO TABS: 25.0000 mg | ORAL_TABLET | Freq: Every day | ORAL | Status: DC

## 2014-01-30 NOTE — Patient Instructions (Signed)

## 2014-01-30 NOTE — Progress Notes (Signed)
   Subjective:    Patient ID: Michael [name], male    DOB: [birth year], 43 y.o.   MRN: [id number]  HPI 43 y.o. Non employed Male presents to clinic for evaluation of hypertension and clearance to continue donating plasma. Has been taking lisinopril, bisoprolol and amlodipine for this. Has been donating plasma and has noticed BP running high. Family history of hypertension. 160/120 upon retaking in room. Usually sees Dr. Carlota Raspberry for this.  States that he is pretty good with diet and has had fluctuating weight. Has substituted Mrs. Dash for salt to try to help with BP. Goes to the [facility] twice a week.    Review of Systems     Objective:   Physical Exam I repeated blood pressures in both arms they're 180/120. Cardiac exam is unremarkable to        Assessment & Plan:  I discussed compliance issues weight loss issues and advised he increase the amount of exercise he is doing. Will add HCTZ 25 one a day repeat blood pressure one week. His last potassium was borderline low at 3.4 and I have started him on a diuretic. He needs to return to clinic in one week with basic metabolic panel to be done at that time.

## 2014-07-26 ENCOUNTER — Telehealth: Payer: Self-pay | Admitting: Family Medicine

## 2014-07-26 ENCOUNTER — Ambulatory Visit (INDEPENDENT_AMBULATORY_CARE_PROVIDER_SITE_OTHER): Payer: 59 | Admitting: Family Medicine

## 2014-07-26 VITALS — BP 152/102 | HR 80 | Temp 97.6°F | Resp 18 | Ht 69.0 in | Wt 301.0 lb

## 2014-07-26 DIAGNOSIS — Z9119 Patient's noncompliance with other medical treatment and regimen: Secondary | ICD-10-CM

## 2014-07-26 DIAGNOSIS — F418 Other specified anxiety disorders: Secondary | ICD-10-CM

## 2014-07-26 DIAGNOSIS — E876 Hypokalemia: Secondary | ICD-10-CM

## 2014-07-26 DIAGNOSIS — Z8349 Family history of other endocrine, nutritional and metabolic diseases: Secondary | ICD-10-CM

## 2014-07-26 DIAGNOSIS — I10 Essential (primary) hypertension: Secondary | ICD-10-CM

## 2014-07-26 DIAGNOSIS — Z91199 Patient's noncompliance with other medical treatment and regimen due to unspecified reason: Secondary | ICD-10-CM

## 2014-07-26 DIAGNOSIS — R739 Hyperglycemia, unspecified: Secondary | ICD-10-CM

## 2014-07-26 DIAGNOSIS — R7309 Other abnormal glucose: Secondary | ICD-10-CM

## 2014-07-26 DIAGNOSIS — F341 Dysthymic disorder: Secondary | ICD-10-CM

## 2014-07-26 LAB — GLUCOSE, POCT (MANUAL RESULT ENTRY): POC GLUCOSE: 116 mg/dL — AB (ref 70–99)

## 2014-07-26 LAB — POCT GLYCOSYLATED HEMOGLOBIN (HGB A1C): HEMOGLOBIN A1C: 5.6

## 2014-07-26 NOTE — Telephone Encounter (Signed)
Although I am listed as Mr. Ullman PCP, I have not seen him since 2013. He was seen by Dr. Everlene Farrier in February with uncontrolled blood pressure at that time and was instructed to follow up in 1 week. PLease call him and check status with this high of a blood pressure -  advise him to return to clinic as soon as possible, or if any chest pain, headache, weakness, shortness of breath - proceed directly to emergency room now.

## 2014-07-26 NOTE — Progress Notes (Addendum)
This chart was scribed for Michael Ray, MD by Michael [name], ED Scribe. This patient was seen in room 11 and the patient's care was started at 6:43 PM.  Subjective:    Patient ID: Michael [name], male    DOB: 01-Apr-1971, 43 y.o.   MRN: [id number] Chief Complaint  Patient presents with  . Hypertension    HPI Michael [name] is a 43 y.o. Male  Mr. Michael [name] has a history of hypertension. See phone note from today, where a nurse at home noted a BP of 180/120 and advised pt to be seen in the clinic or report to the ED. I am identified as his PCP. But have not seen the pt since November 2013. He has seen other providers at this office since that time, most recent with Dr. Everlene [name] with a BP reading of 170/120. At the office hi BP was re-measured by Dr. Everlene [name] with a reading of 180/120 in both arms. He added HCTZ 25 to his previous regiment of Bisoprolol 10 mg a day and Lisinopril 40 mg a day. Last lab work was from November 2014, with a  creatinine of 0.97, but boarderline low potassium at 3.4. Pt was advised to return to the clinic in one week.   Hyperglycemia: over past 2 year his blood sugar has been 106-107 down to 87, with a A1C of 5.6 in November of 2013.   Pt is here today to follow up on his hypertension. He states that a home nurse from his insurance company comes by his house to measure his BP annually. Today when it was measured it was reported to be 180/120. Pt states that he has been out of two of his medication for about 1 week. [name] does not recall which medications he has been out of. Advised pt to return to the office with his medication. Pt states that he has been regularly measuring his BP, with reading in the range of 180/96. He states that his last reading was 115/26; advised him to return to the office with his BP machine so that the staff can check the accuracy of his BP machine.   Pt states that when he was told to follow up at the clinic by Dr. Everlene [name] he did not follow his course of  treatment. He states that he has not been seen by anyone ever since then. Pt states that he secondary to stress he forgets to take his medication. Today, pt reports a short episode of headaches, but it has resolved. He also reports a tingling to the middle finger of his right hand which he states has been bothering him for a long time. Denies palpitations, dizziness, SOB, chest pain, or leg swelling.  Mother reports a family history of hypertension and DM.  PCP: Michael Agreste, MD  Patient Active Problem List   Diagnosis Date Noted  . OSA (obstructive sleep apnea) 04/06/2013  . Abnormal EKG 11/08/2012  . Hypertension   . Noncompliance   . HTN (hypertension), malignant 04/29/2012  . Migraine headache with aura 04/29/2012   Past Medical History  Diagnosis Date  . Hypertension   . Migraine   . Depression     Questionable per records. Not on any medication.   . Schizophrenia     Questionable per records. Not on any medication.   . Noncompliance   . Anxiety   . Insomnia   . Hx of echocardiogram     a. Echo 12/13/12: Mild LVH, EF 27-25%, grade 1 diastolic dysfunction, mild  LAE, mild RVE, mild RAE  . OSA (obstructive sleep apnea)     a. Sleep Study 02/2013:  mod OSA, AHI 33 per hour, O2 sat nadir 85%   Past Surgical History  Procedure Laterality Date  . Mandible surgery  1998    metal plate  . Admission  04/2012    Malignant HTN, HA.  CT head negative, cardiology consult.     No Known Allergies Prior to Admission medications   Medication Sig Start Date End Date Taking? Authorizing Provider  amLODipine (NORVASC) 10 MG tablet Take 10 mg by mouth daily.   Yes Historical Provider, MD  bisoprolol (ZEBETA) 10 MG tablet Take 10 mg by mouth daily.   Yes Historical Provider, MD  cyclobenzaprine (FLEXERIL) 10 MG tablet Take 1/2 or 1 tablet twice a day as needed for jaw spasm. 12/05/13  Yes Michael Filler Copland, MD  hydrochlorothiazide (HYDRODIURIL) 25 MG tablet Take 1 tablet (25 mg total) by  mouth daily. 01/30/14  Yes Michael Russian, MD  hydrOXYzine (VISTARIL) 25 MG capsule Take 25 mg by mouth 3 (three) times daily as needed.   Yes Historical Provider, MD  lisinopril (PRINIVIL,ZESTRIL) 40 MG tablet Take 40 mg by mouth daily.   Yes Historical Provider, MD  risperiDONE (RISPERDAL) 1 MG tablet Take 1 mg by mouth at bedtime.   Yes Historical Provider, MD  sertraline (ZOLOFT) 100 MG tablet Take 100 mg by mouth daily.   Yes Historical Provider, MD  traZODone (DESYREL) 100 MG tablet Take 100 mg by mouth at bedtime.   Yes Historical Provider, MD   History   Social History  . Marital Status: Divorced    Spouse Name: N/A    Number of Children: 48  . Years of Education: N/A   Occupational History  . Unemployed//disabled    Social History Main Topics  . Smoking status: Never Smoker   . Smokeless tobacco: Never Used  . Alcohol Use: No  . Drug Use: No  . Sexual Activity: Yes     Comment: per pt's blue health form - 1 sex partner in last 12 months   Other Topics Concern  . Not on file   Social History Narrative   Marital status: single; dating with fiance      Children: 5 children, no grandchildren.     Lives: with fiance, fiance children.      Employment:  Disability for learning disabilities since 2012.         Review of Systems  Constitutional: Negative for fatigue and unexpected weight change.  Eyes: Negative for visual disturbance.  Respiratory: Negative for cough, chest tightness and shortness of breath.   Cardiovascular: Negative for chest pain, palpitations and leg swelling.  Gastrointestinal: Negative for abdominal pain and blood in stool.  Neurological: Negative for dizziness, light-headedness and headaches.   Objective:   Physical Exam  Vitals reviewed. Constitutional: He is oriented to person, place, and time. He appears well-developed and well-nourished.  HENT:  Head: Normocephalic and atraumatic.  Eyes: EOM are normal. Pupils are equal, round, and reactive to  light.  Neck: No JVD present. Carotid bruit is not present. No thyromegaly present.  Cardiovascular: Normal rate, regular rhythm and normal heart sounds.   No murmur heard. Pulmonary/Chest: Effort normal and breath sounds normal. He has no rales.  Abdominal: Soft. Bowel sounds are normal. He exhibits no distension, no pulsatile midline mass and no mass. There is no tenderness.  Musculoskeletal: He exhibits no edema.  Neurological: He is alert  and oriented to person, place, and time.  Skin: Skin is warm and dry.  Psychiatric: He has a normal mood and affect.   Filed Vitals:   07/26/14 1827  BP: 152/102  Pulse: 80  Temp: 97.6 F (36.4 C)  TempSrc: Oral  Resp: 18  Height: 5\' 9"  (1.753 m)  Weight: 301 lb (136.533 kg)  SpO2: 98%    Results for orders placed in visit on 07/26/14  GLUCOSE, POCT (MANUAL RESULT ENTRY)      Result Value Ref Range   POC Glucose 116 (*) 70 - 99 mg/dl  POCT GLYCOSYLATED HEMOGLOBIN (HGB A1C)      Result Value Ref Range   Hemoglobin A1C 5.6      Assessment & Plan:  Maurico Leiphart is a 43 y.o. male Essential hypertension - Plan: BASIC METABOLIC PANEL WITH GFR, Non-adherence to medical treatment - Plan: BASIC METABOLIC PANEL WITH GFR  -asymptomatic in office, but ER/RTC precautions discussed.   - risks of elevated BP and non adherence discussed, including but not limited to CVA, MI, renal failure or death. Understanding expressed and did not identify any barriers or intolerance to taking medication.  He does have a pill case now to help in remembering to take meds.   - check BMP, no changes in meds for now - but recheck ON meds in next 2 weeks. Understanding expressed.   Hyperglycemia - Plan: POCT glucose (manual entry), POCT glycosylated hemoglobin (Hb A1C)  - borderline, but stable A1c - not quite at prediabetes range. Wt loss, diet discussed.  Hypokalemia - Plan: BASIC METABOLIC PANEL WITH GFR  -recheck BMP  Family history of hypothyroidism - Plan:  TSH  -check TSH.   Depression with anxiety  -at end of visit -  requested referral to therapist.  Mother would like the pt to see a Psychiatrist. He was previously being seen for depression and stress and [facility]. Pt states that he has given a prescription for Zoloft 100 once a day, which he has had filled several times. Pt is still taking Risperdal as well or trazodone.  he states that "he likes to switch them up". Mother states that they placed him on it because he was having thoughts of harming himself years ago. Pt states that he no longer has suicidal ideations. He denies any recent SI or HI. Pt does not drink any alcohol.   His [facility] visit at [facility] was approximately a year ago. He states that he doesn't like the way he is treated there and would like to try going to another facility. Currently pt has a couple of more refills on his Zoloft. Pt reports taking Trazodone 100 mg at night which he is taking in place of the Risperdal, because he states that the Trazodone works better for him. Mr. Sobocinski states that he takes the prescribed Zoloft daily.  Pt would also like a referral to a  Counselor, with whom he can talk to about his stress and depression.   No orders of the defined types were placed in this encounter.   Patient Instructions  I am concerned abut your blood pressure today, but it may improve on your usual medications. Restart your 3 Blood Pressure medications and recheck with Dr. Carlota Raspberry is the next 2 weeks. Bring your blood pressure machine with you. Keep a record of your blood pressures outside of the office and bring them to the next office visit. You should receive a call or letter about your lab results within the next  week to 10 days.  Return to the clinic or go to the nearest emergency room if any of your symptoms worsen or new symptoms occur.  We will also schedule a physical in the next few months, and will call you with this appointment.  I personally performed the services  described in this documentation, which was scribed in my presence. The recorded information has been reviewed and considered, and addended by me as needed.

## 2014-07-26 NOTE — Telephone Encounter (Signed)
He has been experiencing a headache 1-2  times weekly lasting all day, denies chest pain, no nausea, no shoulder or back pain, no sob, denies arm pain, denies visual changes, no weakness. i explained to him the importance of coming into the office as soon as possible or if he begins feeling any of the symptoms listed in the previous note, to please got to the ED. He expressed an understanding and is trying to find transportation to the clinic to be seen today.

## 2014-07-26 NOTE — Patient Instructions (Addendum)
I am concerned abut your blood pressure today, but it may improve on your usual medications. Restart your 3 Blood Pressure medications and recheck with Dr. Carlota Raspberry is the next 2 weeks. Bring your blood pressure machine with you. Keep a record of your blood pressures outside of the office and bring them to the next office visit. You should receive a call or letter about your lab results within the next week to 10 days.  Return to the clinic or go to the nearest emergency room if any of your symptoms worsen or new symptoms occur.  We will also schedule a physical in the next few months, and will call you with this appointment.  Blood sugar borderline elevated.  Watch diet and we can discuss this further next office visit.   Continue your zoloft and trazodone as prescribed by psychiatrist, and I will refer you to new psychiatrist.  Here is a number for counselor- you will need to call and schedule appointment.  Let me know if you need other numbers.  [facility]: [phone]  Return for recheck in 2 weeks.

## 2014-07-26 NOTE — Telephone Encounter (Signed)
Mr Cords nurse, His BP was 180/120. Pt las refill on BP med was in march. He is not compliant with his BP medication.

## 2014-07-27 LAB — BASIC METABOLIC PANEL WITH GFR
BUN: 17 mg/dL (ref 6–23)
CO2: 25 mEq/L (ref 19–32)
Calcium: 9.2 mg/dL (ref 8.4–10.5)
Chloride: 102 mEq/L (ref 96–112)
Creat: 1.01 mg/dL (ref 0.50–1.35)
Glucose, Bld: 107 mg/dL — ABNORMAL HIGH (ref 70–99)
POTASSIUM: 3.4 meq/L — AB (ref 3.5–5.3)
Sodium: 139 mEq/L (ref 135–145)

## 2014-07-27 LAB — TSH: TSH: 0.665 u[IU]/mL (ref 0.350–4.500)

## 2014-08-31 ENCOUNTER — Telehealth: Payer: Self-pay

## 2014-08-31 ENCOUNTER — Telehealth: Payer: Self-pay | Admitting: Radiology

## 2014-08-31 NOTE — Telephone Encounter (Signed)
Error

## 2014-08-31 NOTE — Telephone Encounter (Signed)
I do not think patient meets the requirements for SCAT transportation, does he have any mobility issues which are not noted in the exam? Does he need assistance or full time care?

## 2014-08-31 NOTE — Telephone Encounter (Signed)
Pt brought SCAT forms to 104 to have signed. It appears from chart that dr Carlota Raspberry is the last to see pt. Gave forms to Amy L and she will forward to Dr Carlota Raspberry to fill out.   Please call pt or pts mother to pick up when ready Pt: [phone] pts mother [place] Balaban: [phone] --- **pt oks mother to pick up forms when ready**  bf

## 2014-09-01 NOTE — Telephone Encounter (Signed)
I am unaware of these specific needs or if he would qualify either.  Can we call patient and get more info on reason for SCAT forms? Thanks.

## 2014-09-05 ENCOUNTER — Telehealth: Payer: Self-pay

## 2014-09-05 NOTE — Telephone Encounter (Signed)
Noted - see previous phone encounter.

## 2014-09-05 NOTE — Telephone Encounter (Signed)
Patient Mother returned Sara's call about SCAT ppw. She is not listed on privacy form that we can disclose patient information to her. She says that patient is mentally challenged and can't understand how to handle business very well. She will bring patient to the office today to update privacy form and ask about SCAT ppw. She is also working on getting power of attorney in place for patient and will bring that when she gets it done.

## 2014-09-05 NOTE — Telephone Encounter (Signed)
Spoke to pt- He states he does have a learning disability, HTN, and leg swelling that causes him to not be able to walk to a city bus to get to and from his Dr appts. This is the reason for the SCAT paperwork. Pt is coming by today to fill out a new HIPPA giving authorization to speak to his mother on his behalf.

## 2014-09-05 NOTE — Telephone Encounter (Signed)
LM for rtn call. 

## 2014-09-27 ENCOUNTER — Telehealth: Payer: Self-pay

## 2014-09-27 NOTE — Telephone Encounter (Signed)
Dr. Carlota Raspberry- please advise the status of these forms.

## 2014-09-27 NOTE — Telephone Encounter (Signed)
GREENE - Pt wants to know if the SCAT form has been been completed and sent that he dropped off for you about a week ago.  Please call him at [phone]

## 2014-09-29 NOTE — Telephone Encounter (Signed)
Based on prior note, I thought we were obtaining more information after release for parent, but can complete forms with current info.  I checked my ppwk, but I do not have form.. Last was up at nurses station. Can fill out for SCAT bus system due to learning disability, and leg swelling. If other diagnoses needed, can look into this further.  Thanks. -jg.

## 2014-10-02 NOTE — Telephone Encounter (Signed)
Will complete today, then should be ready for pickup. Thanks.

## 2014-10-02 NOTE — Telephone Encounter (Signed)
Pt notified that it's up front for p/u. Will have his mother pick this up.

## 2014-10-02 NOTE — Telephone Encounter (Signed)
SCAT form is in Dr Carlota Raspberry box. Please let me know when completed and I will get it back to Pt mother.

## 2014-10-02 NOTE — Telephone Encounter (Signed)
Paperwork is being sent to me from [address] nurses station.

## 2014-10-31 ENCOUNTER — Telehealth: Payer: Self-pay

## 2014-10-31 NOTE — Telephone Encounter (Signed)
[employer] [facility] faxed order for lumbar and knee supports. Faxed back denial w/note that pt will need eval for need of these braces.

## 2014-11-03 ENCOUNTER — Telehealth: Payer: Self-pay

## 2014-11-03 NOTE — Telephone Encounter (Signed)
I would suspect that the ordering provider would prescribe the CPAP based on results.  IF not, I can help with this, but would check with Dr. Kathleen Argue office for this initially. Thanks.

## 2014-11-03 NOTE — Telephone Encounter (Signed)
[facility] faxed orders for CPAP mask and back brace. I denied back brace bc we have not evaluated pt for this. Dr Carlota Raspberry, I am sending this to you for review of CPAP mask. It looks like Dr Kathlen Mody (cardiologist) ordered sleep study and results are in 'Media" tab date 03/03/13. I don't know whether the cardiologist would handle this since he ordered it, or if order should come to you as PCP? Please advise. I'll keep order form in my [facility] pending file at my desk if you want to sign for it.

## 2014-11-08 NOTE — Telephone Encounter (Signed)
Ordering provider is Richardson Dopp, [place] to reach pt to see if these items are requested or if this is an order form sent unsolicited by the company- both pt contact numbers are disconnected.

## 2014-11-09 ENCOUNTER — Encounter: Payer: Self-pay | Admitting: *Deleted

## 2014-11-09 ENCOUNTER — Telehealth: Payer: Self-pay

## 2014-11-09 NOTE — Telephone Encounter (Signed)
[facility] looking for the CPAP Machine order  Dr. Carlota Raspberry patient   Please send fax back for order of CPAP Machine.   CB number if we cannot locate fax is [phone]

## 2014-11-09 NOTE — Telephone Encounter (Signed)
Faxed order form back to [facility] asking them to send order to Richardson Dopp, [place].

## 2014-11-10 NOTE — Telephone Encounter (Signed)
This has been resolved in prior phone message completed same day.

## 2014-11-15 ENCOUNTER — Telehealth: Payer: Self-pay

## 2014-11-15 NOTE — Telephone Encounter (Signed)
Got fax from [facility] for order for knee and ankle brace. Denied w/note that pt would need to RTC for eval of need.

## 2014-12-04 ENCOUNTER — Ambulatory Visit: Payer: 59 | Admitting: Family Medicine

## 2015-01-17 DIAGNOSIS — G4733 Obstructive sleep apnea (adult) (pediatric): Secondary | ICD-10-CM | POA: Diagnosis not present

## 2015-02-16 DIAGNOSIS — G4733 Obstructive sleep apnea (adult) (pediatric): Secondary | ICD-10-CM | POA: Diagnosis not present

## 2015-03-20 DIAGNOSIS — G4733 Obstructive sleep apnea (adult) (pediatric): Secondary | ICD-10-CM | POA: Diagnosis not present

## 2015-04-19 DIAGNOSIS — G4733 Obstructive sleep apnea (adult) (pediatric): Secondary | ICD-10-CM | POA: Diagnosis not present

## 2015-05-21 DIAGNOSIS — G4733 Obstructive sleep apnea (adult) (pediatric): Secondary | ICD-10-CM | POA: Diagnosis not present

## 2015-06-13 ENCOUNTER — Ambulatory Visit (INDEPENDENT_AMBULATORY_CARE_PROVIDER_SITE_OTHER): Payer: Medicare Other | Admitting: Physician Assistant

## 2015-06-13 VITALS — BP 150/92 | HR 77 | Temp 98.0°F | Resp 18 | Ht 70.0 in | Wt 293.0 lb

## 2015-06-13 DIAGNOSIS — Z8659 Personal history of other mental and behavioral disorders: Secondary | ICD-10-CM | POA: Diagnosis not present

## 2015-06-13 DIAGNOSIS — Z9119 Patient's noncompliance with other medical treatment and regimen: Secondary | ICD-10-CM | POA: Insufficient documentation

## 2015-06-13 DIAGNOSIS — E669 Obesity, unspecified: Secondary | ICD-10-CM | POA: Diagnosis not present

## 2015-06-13 DIAGNOSIS — Z9114 Patient's other noncompliance with medication regimen: Secondary | ICD-10-CM

## 2015-06-13 DIAGNOSIS — I10 Essential (primary) hypertension: Secondary | ICD-10-CM

## 2015-06-13 DIAGNOSIS — Z91199 Patient's noncompliance with other medical treatment and regimen due to unspecified reason: Secondary | ICD-10-CM | POA: Insufficient documentation

## 2015-06-13 LAB — LIPID PANEL
Cholesterol: 134 mg/dL (ref 0–200)
HDL: 42 mg/dL (ref >=40)
LDL Cholesterol: 84 mg/dL (ref 0–99)
TRIGLYCERIDES: 42 mg/dL (ref <150)
Total CHOL/HDL Ratio: 3.2 Ratio
VLDL: 8 mg/dL (ref 0–40)

## 2015-06-13 LAB — COMPLETE METABOLIC PANEL WITH GFR
ALT: 12 U/L (ref 0–53)
AST: 13 U/L (ref 0–37)
Albumin: 4 g/dL (ref 3.5–5.2)
Alkaline Phosphatase: 67 U/L (ref 39–117)
BILIRUBIN TOTAL: 0.5 mg/dL (ref 0.2–1.2)
BUN: 15 mg/dL (ref 6–23)
CO2: 29 meq/L (ref 19–32)
CREATININE: 0.96 mg/dL (ref 0.50–1.35)
Calcium: 9.1 mg/dL (ref 8.4–10.5)
Chloride: 103 mEq/L (ref 96–112)
GFR, Est African American: >89 mL/min
GFR, Est Non African American: >89 mL/min
Glucose, Bld: 106 mg/dL — ABNORMAL HIGH (ref 70–99)
Potassium: 3.2 mEq/L — ABNORMAL LOW (ref 3.5–5.3)
SODIUM: 141 meq/L (ref 135–145)
Total Protein: 7.4 g/dL (ref 6.0–8.3)

## 2015-06-13 LAB — POCT CBC
GRANULOCYTE PERCENT: 40 % (ref 37–80)
HEMATOCRIT: 42.7 % — AB (ref 43.5–53.7)
Hemoglobin: 13.7 g/dL — AB (ref 14.1–18.1)
Lymph, poc: 2.9 (ref 0.6–3.4)
MCH, POC: 27.2 pg (ref 27–31.2)
MCHC: 32.1 g/dL (ref 31.8–35.4)
MCV: 84.6 fL (ref 80–97)
MID (CBC): 0.2 (ref 0–0.9)
MPV: 8.3 fL (ref 0–99.8)
PLATELET COUNT, POC: 201 10*3/uL (ref 142–424)
POC Granulocyte: 2 (ref 2–6.9)
POC LYMPH PERCENT: 56.5 %L — AB (ref 10–50)
POC MID %: 3.5 %M (ref 0–12)
RBC: 5.05 M/uL (ref 4.69–6.13)
RDW, POC: 16.1 %
WBC: 5.1 10*3/uL (ref 4.6–10.2)

## 2015-06-13 LAB — TSH: TSH: 0.741 u[IU]/mL (ref 0.350–4.500)

## 2015-06-13 LAB — GLUCOSE, POCT (MANUAL RESULT ENTRY): POC GLUCOSE: 112 mg/dL — AB (ref 70–99)

## 2015-06-13 LAB — POCT GLYCOSYLATED HEMOGLOBIN (HGB A1C): Hemoglobin A1C: 5.6

## 2015-06-13 MED ORDER — SERTRALINE HCL 100 MG PO TABS: 100.0000 mg | ORAL_TABLET | Freq: Every day | ORAL | Status: DC

## 2015-06-13 MED ORDER — LISINOPRIL 40 MG PO TABS: 40.0000 mg | ORAL_TABLET | Freq: Every day | ORAL | Status: DC

## 2015-06-13 MED ORDER — TRAZODONE HCL 100 MG PO TABS: 100.0000 mg | ORAL_TABLET | Freq: Every day | ORAL | Status: DC

## 2015-06-13 NOTE — Progress Notes (Signed)
06/13/2015 at 9:22 PM  [facility] / DOB: 03-12-1971 / MRN: [id number]  The patient has HTN (hypertension), malignant; Migraine headache with aura; Hypertension; Abnormal EKG; OSA (obstructive sleep apnea); and Non compliance w medication regimen on his problem list.  SUBJECTIVE  Chief complaint: Hypertension   Patient here because he needs a form signed stating that it is safe for him to donate plasma.  He has a history of malignant HTN and is not taking any of his medications for over a year now.  He is exercising and has cut out junk food and soda since his last visit and has lost over ten lbs as a result.  He reports walking roughly 2 hours daily.  He does not complain of HA, vision changes, presyncope, DOE and SOB.   He  has a past medical history of Hypertension; Migraine; Depression; Schizophrenia; Noncompliance; Anxiety; Insomnia; echocardiogram; and OSA (obstructive sleep apnea).    Medications reviewed and updated by myself where necessary, and exist elsewhere in the encounter.   Michael [name] [name] has No Known Allergies. He  reports that he has never smoked. He has never used smokeless tobacco. He reports that he does not drink alcohol or use illicit drugs. He  reports that he currently engages in sexual activity. The patient  has past surgical history that includes Mandible surgery (1998) and Admission (04/2012).  His family history includes Cancer in his father, maternal grandfather, and paternal grandfather; Diabetes in his mother; Heart disease (age of onset: 3) in his father; Hypertension in his brother, father, and mother.  Review of Systems  Constitutional: Negative for fever and chills.  Eyes: Negative for blurred vision.  Respiratory: Negative for cough and wheezing.   Cardiovascular: Negative for chest pain and palpitations.  Gastrointestinal: Negative for nausea.  Genitourinary: Negative.   Musculoskeletal: Negative for myalgias.  Skin: Negative for rash.  Neurological:  Negative for dizziness.    OBJECTIVE  His  height is 5\' 10"  (1.778 m) and weight is 293 lb (132.904 kg). His oral temperature is 98 F (36.7 C). His blood pressure is 150/92 and his pulse is 77. His respiration is 18 and oxygen saturation is 97%.  The patient's body mass index is 42.04 kg/(m^2).  Physical Exam  Vitals reviewed. Constitutional: He is oriented to person, place, and time. He appears well-developed and well-nourished.  Obese  HENT:  Head: Normocephalic.  Eyes: EOM are normal. Pupils are equal, round, and reactive to light. No scleral icterus.  Cardiovascular: Normal rate.   Respiratory: Effort normal and breath sounds normal.  GI: Soft. Bowel sounds are normal.  Neurological: He is alert and oriented to person, place, and time. No cranial nerve deficit.  Skin: Skin is warm and dry.  Psychiatric: He has a normal mood and affect.    Results for orders placed or performed in visit on 06/13/15 (from the past 24 hour(s))  TSH     Status: None   Collection Time: 06/13/15 11:18 AM  Result Value Ref Range   TSH 0.741 0.350 - 4.500 uIU/mL   Narrative   Performed at:  [address], [address]                [place], [place] [address]  Lipid panel     Status: None   Collection Time: 06/13/15 11:18 AM  Result Value Ref Range   Cholesterol 134 0 - 200  mg/dL   Triglycerides 42 <150 mg/dL   HDL 42 >=40 mg/dL   Total CHOL/HDL Ratio 3.2 Ratio   VLDL 8 0 - 40 mg/dL   LDL Cholesterol 84 0 - 99 mg/dL   Narrative   Performed at:  [address], [address]                [place], [place] [address]  COMPLETE METABOLIC PANEL WITH GFR     Status: Abnormal   Collection Time: 06/13/15 11:18 AM  Result Value Ref Range   Sodium 141 135 - 145 mEq/L   Potassium 3.2 (L) 3.5 - 5.3 mEq/L   Chloride 103 96 - 112 mEq/L   CO2 29 19 - 32 mEq/L   Glucose, Bld 106 (H) 70 - 99 mg/dL   BUN 15 6 - 23 mg/dL   Creat 0.96 0.50  - 1.35 mg/dL   Total Bilirubin 0.5 0.2 - 1.2 mg/dL   Alkaline Phosphatase 67 39 - 117 U/L   AST 13 0 - 37 U/L   ALT 12 0 - 53 U/L   Total Protein 7.4 6.0 - 8.3 g/dL   Albumin 4.0 3.5 - 5.2 g/dL   Calcium 9.1 8.4 - 10.5 mg/dL   GFR, Est African American >89 mL/min   GFR, Est Non African American >89 mL/min   Narrative   Performed at:  [address], [address]                [place], [place] [address]  POCT glycosylated hemoglobin (Hb A1C)     Status: None   Collection Time: 06/13/15 11:32 AM  Result Value Ref Range   Hemoglobin A1C 5.6   POCT CBC     Status: Abnormal   Collection Time: 06/13/15 11:32 AM  Result Value Ref Range   WBC 5.1 4.6 - 10.2 K/uL   Lymph, poc 2.9 0.6 - 3.4   POC LYMPH PERCENT 56.5 (A) 10 - 50 %L   MID (cbc) 0.2 0 - 0.9   POC MID % 3.5 0 - 12 %M   POC Granulocyte 2.0 2 - 6.9   Granulocyte percent 40.0 37 - 80 %G   RBC 5.05 4.69 - 6.13 M/uL   Hemoglobin 13.7 (A) 14.1 - 18.1 g/dL   HCT, POC 42.7 (A) 43.5 - 53.7 %   MCV 84.6 80 - 97 fL   MCH, POC 27.2 27 - 31.2 pg   MCHC 32.1 31.8 - 35.4 g/dL   RDW, POC 16.1 %   Platelet Count, POC 201 142 - 424 K/uL   MPV 8.3 0 - 99.8 fL  POCT glucose (manual entry)     Status: Abnormal   Collection Time: 06/13/15 11:32 AM  Result Value Ref Range   POC Glucose 112 (A) 70 - 99 mg/dl   Lab Results  Component Value Date   CREATININE 0.96 06/13/2015   CREATININE 1.01 07/26/2014   CREATININE 1.00 09/24/2013    Chest radiograph on 04/30/2012 negative. Last EKG on 12/05/13 within normal limits.   ASSESSMENT & PLAN  Michael [name] was seen today for hypertension.  Diagnoses and all orders for this visit:  Essential hypertension: Patient cleared to donate plasma for 45 days from today.  He will be required to come back after that time for  follow up.  Given the last problem and his improvement in blood pressure I have simplified his medication regimen to include only Lisinopril 40 mg qd and  his medication for history of depression.  Patient will likely need BP medication titration at future visits. His labs are reassuring.     Orders: -     POCT glycosylated hemoglobin (Hb A1C) -     POCT CBC -     POCT glucose (manual entry) -     TSH -     Lipid panel -     COMPLETE METABOLIC PANEL WITH GFR -     lisinopril (PRINIVIL,ZESTRIL) 40 MG tablet; Take 1 tablet (40 mg total) by mouth daily.   History of depression Orders: -     sertraline (ZOLOFT) 100 MG tablet; Take 1 tablet (100 mg total) by mouth daily. -     traZODone (DESYREL) 100 MG tablet; Take 1 tablet (100 mg total) by mouth at bedtime.  Non compliance w medication regimen: Managed with problem one.    Obesity: Patient encouraged to continue with healthy lifestyle choices.      The patient was advised to call or come back to clinic if he does not see an improvement in symptoms, or worsens with the above plan.   Philis Fendt, MHS, PA-C [facility] 06/13/2015 9:22 PM

## 2015-06-18 ENCOUNTER — Other Ambulatory Visit: Payer: Self-pay | Admitting: Physician Assistant

## 2015-06-18 DIAGNOSIS — E876 Hypokalemia: Secondary | ICD-10-CM

## 2015-06-18 MED ORDER — POTASSIUM CHLORIDE ER 10 MEQ PO TBCR: 10.0000 meq | EXTENDED_RELEASE_TABLET | Freq: Every day | ORAL | Status: DC

## 2015-06-18 NOTE — Progress Notes (Signed)
Spoke with Patient he will return to get blood drawn

## 2015-06-19 ENCOUNTER — Other Ambulatory Visit (INDEPENDENT_AMBULATORY_CARE_PROVIDER_SITE_OTHER): Payer: Medicare Other

## 2015-06-19 DIAGNOSIS — E876 Hypokalemia: Secondary | ICD-10-CM | POA: Diagnosis not present

## 2015-06-19 LAB — COMPLETE METABOLIC PANEL WITH GFR
ALK PHOS: 62 U/L (ref 39–117)
ALT: 11 U/L (ref 0–53)
AST: 13 U/L (ref 0–37)
Albumin: 4 g/dL (ref 3.5–5.2)
BUN: 13 mg/dL (ref 6–23)
CO2: 27 mEq/L (ref 19–32)
Calcium: 9 mg/dL (ref 8.4–10.5)
Chloride: 102 mEq/L (ref 96–112)
Creat: 0.97 mg/dL (ref 0.50–1.35)
GFR, Est Non African American: >89 mL/min
Glucose, Bld: 98 mg/dL (ref 70–99)
POTASSIUM: 3.2 meq/L — AB (ref 3.5–5.3)
SODIUM: 139 meq/L (ref 135–145)
TOTAL PROTEIN: 7.3 g/dL (ref 6.0–8.3)
Total Bilirubin: 0.5 mg/dL (ref 0.2–1.2)

## 2015-06-20 DIAGNOSIS — G4733 Obstructive sleep apnea (adult) (pediatric): Secondary | ICD-10-CM | POA: Diagnosis not present

## 2015-06-21 LAB — ALDOSTERONE: ALDOSTERONE, SERUM: 3 ng/dL

## 2015-06-22 LAB — RENIN: Renin Activity: 0.39 ng/mL/h (ref 0.25–5.82)

## 2015-07-20 DIAGNOSIS — G4733 Obstructive sleep apnea (adult) (pediatric): Secondary | ICD-10-CM | POA: Diagnosis not present

## 2015-07-30 ENCOUNTER — Ambulatory Visit: Payer: Medicare Other | Admitting: Physician Assistant

## 2015-08-08 ENCOUNTER — Other Ambulatory Visit: Payer: Self-pay | Admitting: Physician Assistant

## 2015-08-21 DIAGNOSIS — G4733 Obstructive sleep apnea (adult) (pediatric): Secondary | ICD-10-CM | POA: Diagnosis not present

## 2015-08-27 ENCOUNTER — Ambulatory Visit (INDEPENDENT_AMBULATORY_CARE_PROVIDER_SITE_OTHER): Payer: Medicare Other | Admitting: Physician Assistant

## 2015-08-27 ENCOUNTER — Other Ambulatory Visit: Payer: Self-pay | Admitting: Physician Assistant

## 2015-08-27 ENCOUNTER — Encounter: Payer: Self-pay | Admitting: Physician Assistant

## 2015-08-27 VITALS — BP 189/124 | HR 76 | Temp 98.5°F | Resp 16 | Ht 69.25 in | Wt 283.6 lb

## 2015-08-27 DIAGNOSIS — I10 Essential (primary) hypertension: Secondary | ICD-10-CM | POA: Diagnosis not present

## 2015-08-27 DIAGNOSIS — Z23 Encounter for immunization: Secondary | ICD-10-CM | POA: Diagnosis not present

## 2015-08-27 DIAGNOSIS — E876 Hypokalemia: Secondary | ICD-10-CM | POA: Diagnosis not present

## 2015-08-27 LAB — COMPLETE METABOLIC PANEL WITH GFR
ALK PHOS: 65 U/L (ref 40–115)
ALT: 13 U/L (ref 9–46)
AST: 12 U/L (ref 10–40)
Albumin: 4 g/dL (ref 3.6–5.1)
BUN: 15 mg/dL (ref 7–25)
CALCIUM: 9 mg/dL (ref 8.6–10.3)
CO2: 26 mmol/L (ref 20–31)
Chloride: 105 mmol/L (ref 98–110)
Creat: 0.87 mg/dL (ref 0.60–1.35)
GFR, Est Non African American: >89 mL/min (ref >=60)
Glucose, Bld: 96 mg/dL (ref 65–99)
POTASSIUM: 3.9 mmol/L (ref 3.5–5.3)
Sodium: 140 mmol/L (ref 135–146)
Total Bilirubin: 0.3 mg/dL (ref 0.2–1.2)
Total Protein: 7.1 g/dL (ref 6.1–8.1)

## 2015-08-27 MED ORDER — POTASSIUM CHLORIDE ER 10 MEQ PO TBCR: EXTENDED_RELEASE_TABLET | ORAL | Status: DC

## 2015-08-27 MED ORDER — LISINOPRIL 40 MG PO TABS: 40.0000 mg | ORAL_TABLET | Freq: Every day | ORAL | Status: DC

## 2015-08-27 MED ORDER — AMLODIPINE BESYLATE 5 MG PO TABS
5.0000 mg | ORAL_TABLET | Freq: Every day | ORAL | Status: DC
Start: 2015-08-27 — End: 2015-09-24

## 2015-08-27 MED ORDER — LISINOPRIL-HYDROCHLOROTHIAZIDE 20-25 MG PO TABS: 1.0000 | ORAL_TABLET | Freq: Every day | ORAL | Status: DC

## 2015-08-27 NOTE — Progress Notes (Signed)
08/28/2015 at 8:27 AM  Michael [name] / DOB: 1971-01-26 / MRN: [id number]  The patient has HTN (hypertension), malignant; Migraine headache with aura; Hypertension; Abnormal EKG; OSA (obstructive sleep apnea); and Non compliance w medication regimen on his problem list.  SUBJECTIVE  Michael [name] is a 44 y.o. well appearing male with a history of malignant HTN and medication noncompliance presenting for the chief complaint of follow up.  He reports he has been taking Lisinopril 40 mg daily without fail since his last appointment.   Today he would like to know the name of "a drink" that he could purchase OTC that would lower his BP. He does not know how many lisinopril he has left but states he has one refill.   He lives with his mother and donates plasma to make money.  He would like a form filled out stating that he is medically able to do this.      He  has a past medical history of Hypertension; Migraine; Depression; Schizophrenia; Noncompliance; Anxiety; Insomnia; echocardiogram; and OSA (obstructive sleep apnea).    Medications reviewed and updated by myself where necessary, and exist elsewhere in the encounter.   Michael [name] has No Known Allergies. He  reports that he has never smoked. He has never used smokeless tobacco. He reports that he does not drink alcohol or use illicit drugs. He  reports that he currently engages in sexual activity. The patient  has past surgical history that includes Mandible surgery (1998) and Admission (04/2012).  His family history includes Cancer in his father, maternal grandfather, and paternal grandfather; Diabetes in his mother; Heart disease (age of onset: 86) in his father; Hypertension in his brother, father, and mother.  Review of Systems  Constitutional: Negative for fever.  Respiratory: Negative for cough.   Cardiovascular: Negative for chest pain, orthopnea and leg swelling.  Gastrointestinal: Negative for nausea.  Skin: Negative for rash.    Neurological: Negative for dizziness and headaches.  Endo/Heme/Allergies: Negative for polydipsia.    OBJECTIVE  His  height is 5' 9.25" (1.759 m) and weight is 283 lb 9.6 oz (128.64 kg). His oral temperature is 98.5 F (36.9 C). His blood pressure is 189/124 and his pulse is 76. His respiration is 16 and oxygen saturation is 98%.  The patient's body mass index is 41.58 kg/(m^2).  Physical Exam  Constitutional: He is oriented to person, place, and time.  Cardiovascular: Normal rate, regular rhythm and normal heart sounds.   No murmur heard. Respiratory: Effort normal and breath sounds normal.  GI: Soft.  Musculoskeletal: Normal range of motion.  Neurological: He is alert and oriented to person, place, and time. No cranial nerve deficit.  Skin: Skin is warm and dry.    Results for orders placed or performed in visit on 08/27/15 (from the past 24 hour(s))  COMPLETE METABOLIC PANEL WITH GFR     Status: None   Collection Time: 08/27/15  2:30 PM  Result Value Ref Range   Sodium 140 135 - 146 mmol/L   Potassium 3.9 3.5 - 5.3 mmol/L   Chloride 105 98 - 110 mmol/L   CO2 26 20 - 31 mmol/L   Glucose, Bld 96 65 - 99 mg/dL   BUN 15 7 - 25 mg/dL   Creat 0.87 0.60 - 1.35 mg/dL   Total Bilirubin 0.3 0.2 - 1.2 mg/dL   Alkaline Phosphatase 65 40 - 115 U/L   AST 12 10 - 40 U/L   ALT 13 9 - 46  U/L   Total Protein 7.1 6.1 - 8.1 g/dL   Albumin 4.0 3.6 - 5.1 g/dL   Calcium 9.0 8.6 - 10.3 mg/dL   GFR, Est African American >89 >=60 mL/min   GFR, Est Non African American >89 >=60 mL/min   Narrative   Performed at:  [address], [address]                [place], [place] [address]    ASSESSMENT & PLAN  [name] was seen today for follow-up and hypertension.  Diagnoses and all orders for this visit:  Essential hypertension: Patient with a worsened blood pressure since his last check and he was not on any medication at that time despite being prescribed.   However, states he is taking his medication.  I will make some adjustments to the medication regimen and will call him in two days.  Risk of uncontrolled BP discussed with patient and he has voiced understanding. Follow up in one month or sooner if his ambulatory measures are not reduced after starting new medications.  -     Discontinue: lisinopril (PRINIVIL,ZESTRIL) 40 MG tablet; Take 1 tablet (40 mg total) by mouth daily. -     COMPLETE METABOLIC PANEL WITH GFR -     lisinopril-hydrochlorothiazide (PRINZIDE,ZESTORETIC) 20-25 MG per tablet; Take 1 tablet by mouth daily. -     amLODipine (NORVASC) 5 MG tablet; Take 1 tablet (5 mg total) by mouth daily.  Hypokalemia -     potassium chloride (K-DUR) 10 MEQ tablet; TAKE 1 TABLET(10 MEQ) BY MOUTH DAILY  Need for prophylactic vaccination and inoculation against influenza -     Cancel: Flu Vaccine QUAD 36+ mos IM      The patient was advised to call or come back to clinic if he does not see an improvement in symptoms, or worsens with the above plan.   Philis Fendt, MHS, PA-C [facility] 08/28/2015 8:27 AM

## 2015-09-09 ENCOUNTER — Other Ambulatory Visit: Payer: Self-pay | Admitting: Physician Assistant

## 2015-09-11 ENCOUNTER — Other Ambulatory Visit: Payer: Self-pay | Admitting: Physician Assistant

## 2015-09-20 DIAGNOSIS — G4733 Obstructive sleep apnea (adult) (pediatric): Secondary | ICD-10-CM | POA: Diagnosis not present

## 2015-09-24 ENCOUNTER — Other Ambulatory Visit: Payer: Self-pay | Admitting: Physician Assistant

## 2015-10-03 ENCOUNTER — Ambulatory Visit (INDEPENDENT_AMBULATORY_CARE_PROVIDER_SITE_OTHER): Payer: Medicare Other | Admitting: Physician Assistant

## 2015-10-03 ENCOUNTER — Encounter: Payer: Self-pay | Admitting: Physician Assistant

## 2015-10-03 VITALS — BP 145/103 | HR 84 | Temp 98.2°F | Resp 16 | Ht 69.5 in | Wt 288.0 lb

## 2015-10-03 DIAGNOSIS — G4700 Insomnia, unspecified: Secondary | ICD-10-CM | POA: Diagnosis not present

## 2015-10-03 DIAGNOSIS — I10 Essential (primary) hypertension: Secondary | ICD-10-CM

## 2015-10-03 DIAGNOSIS — Z23 Encounter for immunization: Secondary | ICD-10-CM

## 2015-10-03 LAB — POCT URINALYSIS DIP (MANUAL ENTRY)
BILIRUBIN UA: NEGATIVE
Bilirubin, UA: NEGATIVE
Blood, UA: NEGATIVE
Glucose, UA: NEGATIVE
LEUKOCYTES UA: NEGATIVE
Nitrite, UA: NEGATIVE
PROTEIN UA: NEGATIVE
Spec Grav, UA: 1.025
UROBILINOGEN UA: 0.2
pH, UA: 5.5

## 2015-10-03 LAB — CBC
HCT: 39.6 % (ref 39.0–52.0)
Hemoglobin: 13.1 g/dL (ref 13.0–17.0)
MCH: 27.4 pg (ref 26.0–34.0)
MCHC: 33.1 g/dL (ref 30.0–36.0)
MCV: 82.8 fL (ref 78.0–100.0)
MPV: 10.7 fL (ref 8.6–12.4)
Platelets: 230 10*3/uL (ref 150–400)
RBC: 4.78 MIL/uL (ref 4.22–5.81)
RDW: 15.9 % — ABNORMAL HIGH (ref 11.5–15.5)
WBC: 6.2 10*3/uL (ref 4.0–10.5)

## 2015-10-03 LAB — BASIC METABOLIC PANEL
BUN: 16 mg/dL (ref 7–25)
CHLORIDE: 102 mmol/L (ref 98–110)
CO2: 27 mmol/L (ref 20–31)
Calcium: 8.8 mg/dL (ref 8.6–10.3)
Creat: 1.01 mg/dL (ref 0.60–1.35)
GLUCOSE: 99 mg/dL (ref 65–99)
Potassium: 3.5 mmol/L (ref 3.5–5.3)
SODIUM: 139 mmol/L (ref 135–146)

## 2015-10-03 MED ORDER — AMLODIPINE BESYLATE 10 MG PO TABS: 10.0000 mg | ORAL_TABLET | Freq: Every day | ORAL | Status: DC

## 2015-10-03 MED ORDER — DIPHENHYDRAMINE HCL (SLEEP) 25 MG PO CAPS: 25.0000 mg | ORAL_CAPSULE | Freq: Every day | ORAL | Status: DC

## 2015-10-03 NOTE — Progress Notes (Signed)
10/03/2015 at 1:35 PM  Michael [name] / DOB: 11/18/1971 / MRN: [id number]  The patient has HTN (hypertension), malignant; Migraine headache with aura; Hypertension; Abnormal EKG; OSA (obstructive sleep apnea); and Non compliance w medication regimen on his problem list.  SUBJECTIVE  Michael [name] is a 44 y.o. well appearing male presenting for the chief complaint of f/u for high blood pressure.  Reports he is taking his blood pressure medicine daily and has been checking his BP with measures running roughly 145/100. Seen last month and Norvasc 5 mg was added to his regimen.  He reports remaining compliant with his medication regimen.      He  has a past medical history of Hypertension; Migraine; Depression; Schizophrenia ([place]); Noncompliance; Anxiety; Insomnia; echocardiogram; and OSA (obstructive sleep apnea).    Medications reviewed and updated by myself where necessary, and exist elsewhere in the encounter.   Michael [name] has No Known Allergies. He  reports that he has never smoked. He has never used smokeless tobacco. He reports that he does not drink alcohol or use illicit drugs. He  reports that he currently engages in sexual activity. The patient  has past surgical history that includes Mandible surgery (1998) and Admission (04/2012).  His family history includes Cancer in his father, maternal grandfather, and paternal grandfather; Diabetes in his mother; Heart disease (age of onset: 48) in his father; Hypertension in his brother, father, and mother.  Review of Systems  Eyes: Negative for blurred vision, double vision, photophobia, pain, discharge and redness.  Respiratory: Negative for cough and shortness of breath.   Cardiovascular: Negative for chest pain, orthopnea and leg swelling.  Gastrointestinal: Negative for nausea, abdominal pain, blood in stool and melena.  Genitourinary: Negative.   Neurological: Negative for dizziness and headaches.    OBJECTIVE  His  height is 5' 9.5" (1.765  m) and weight is 288 lb (130.636 kg). His temperature is 98.2 F (36.8 C). His blood pressure is 145/103 and his pulse is 84. His respiration is 16.  The patient's body mass index is 41.93 kg/(m^2).  Physical Exam  Vitals reviewed. Constitutional: He is oriented to person, place, and time. He appears well-developed. No distress.  Eyes: EOM are normal. Pupils are equal, round, and reactive to light. No scleral icterus.  Neck: Normal range of motion.  Cardiovascular: Normal rate and regular rhythm.   Respiratory: Effort normal and breath sounds normal.  GI: Bowel sounds are normal. He exhibits no distension.  Musculoskeletal: Normal range of motion.  Neurological: He is alert and oriented to person, place, and time. No cranial nerve deficit.  Skin: Skin is warm and dry. No rash noted. He is not diaphoretic.  Psychiatric: He has a normal mood and affect.    No results found for this or any previous visit (from the past 24 hour(s)).  ASSESSMENT & PLAN  [name] was seen today for follow-up and hypertension.  Diagnoses and all orders for this visit:  Uncontrolled stage 2 hypertension: Patient with improving pressure.  Will increase Almlodipine to 10 mg daily.  F/U in one month.   -     POCT urinalysis dipstick -     Basic metabolic panel -     CBC -     amLODipine (NORVASC) 10 MG tablet; Take 1 tablet (10 mg total) by mouth daily.   The patient was advised to call or come back to clinic if he does not see an improvement in symptoms, or worsens with the above plan.  Philis Fendt, MHS, PA-C [facility] 10/03/2015 1:35 PM

## 2015-10-09 ENCOUNTER — Ambulatory Visit (INDEPENDENT_AMBULATORY_CARE_PROVIDER_SITE_OTHER): Payer: Medicare Other | Admitting: Emergency Medicine

## 2015-10-09 VITALS — BP 118/80 | HR 84 | Temp 98.5°F | Resp 18 | Ht 70.5 in | Wt 289.2 lb

## 2015-10-09 DIAGNOSIS — S46811A Strain of other muscles, fascia and tendons at shoulder and upper arm level, right arm, initial encounter: Secondary | ICD-10-CM | POA: Diagnosis not present

## 2015-10-09 MED ORDER — NAPROXEN SODIUM 550 MG PO TABS: 550.0000 mg | ORAL_TABLET | Freq: Two times a day (BID) | ORAL | Status: DC

## 2015-10-09 MED ORDER — CYCLOBENZAPRINE HCL 10 MG PO TABS: 10.0000 mg | ORAL_TABLET | Freq: Three times a day (TID) | ORAL | Status: DC | PRN

## 2015-10-09 NOTE — Patient Instructions (Signed)

## 2015-10-09 NOTE — Progress Notes (Signed)
Subjective:  Patient ID: Michael [name], male    DOB: Nov 28, 1971  Age: 44 y.o. MRN: [id number]  CC: Arm Pain   HPI [name] presents  with complaint of pain in his right shoulder and right upper arm. He said that he receives a flu shot as consequences having pain. History of injury or overuse he is disabled and not working. He has pain in his posterior shoulder with no radiation of pain numbness tingling or weakness. He has no history of injury or overuse said no improvement with over-the-counter Advil taking 1 dose.  History [name] has a past medical history of Hypertension; Migraine; Depression; Schizophrenia ([place]); Noncompliance; Anxiety; Insomnia; echocardiogram; and OSA (obstructive sleep apnea).   He has past surgical history that includes Mandible surgery (1998) and Admission (04/2012).   His  family history includes Cancer in his father, maternal grandfather, and paternal grandfather; Diabetes in his mother; Heart disease (age of onset: 62) in his father; Hypertension in his brother, father, and mother.  He   reports that he has never smoked. He has never used smokeless tobacco. He reports that he does not drink alcohol or use illicit drugs.  Outpatient Prescriptions Prior to Visit  Medication Sig Dispense Refill  . amLODipine (NORVASC) 10 MG tablet Take 1 tablet (10 mg total) by mouth daily. 30 tablet 3  . DiphenhydrAMINE HCl, Sleep, 25 MG CAPS Take 25 mg by mouth at bedtime. 30 capsule 3  . lisinopril-hydrochlorothiazide (PRINZIDE,ZESTORETIC) 20-25 MG tablet TAKE 1 TABLET BY MOUTH DAILY 30 tablet 4  . potassium chloride (K-DUR) 10 MEQ tablet TAKE 1 TABLET(10 MEQ) BY MOUTH DAILY 90 tablet 3   No facility-administered medications prior to visit.    Social History   Social History  . Marital Status: Divorced    Spouse Name: N/A  . Number of Children: 5  . Years of Education: N/A   Occupational History  . Unemployed//disabled    Social History Main Topics  . Smoking  status: Never Smoker   . Smokeless tobacco: Never Used  . Alcohol Use: No  . Drug Use: No  . Sexual Activity: Yes     Comment: per pt's blue health form - 1 sex partner in last 12 months   Other Topics Concern  . None   Social History Narrative   Marital status: single; dating with fiance      Children: 5 children, no grandchildren.     Lives: with fiance, fiance children.      Employment:  Disability for learning disabilities since 2012.           Review of Systems  Constitutional: Negative for fever, chills and appetite change.  HENT: Negative for congestion, ear pain, postnasal drip, sinus pressure and sore throat.   Eyes: Negative for pain and redness.  Respiratory: Negative for cough, shortness of breath and wheezing.   Cardiovascular: Negative for leg swelling.  Gastrointestinal: Negative for nausea, vomiting, abdominal pain, diarrhea, constipation and blood in stool.  Endocrine: Negative for polyuria.  Genitourinary: Negative for dysuria, urgency, frequency and flank pain.  Musculoskeletal: Negative for gait problem.  Skin: Negative for rash.  Neurological: Negative for weakness and headaches.  Psychiatric/Behavioral: Negative for confusion and decreased concentration. The patient is not nervous/anxious.     Objective:  BP 118/80 mmHg  Pulse 84  Temp(Src) 98.5 F (36.9 C) (Oral)  Resp 18  Ht 5' 10.5" (1.791 m)  Wt 289 lb 3.2 oz (131.18 kg)  BMI 40.90 kg/m2  SpO2 98%  Physical Exam  Constitutional: He is oriented to person, place, and time. He appears well-developed and well-nourished.  HENT:  Head: Normocephalic and atraumatic.  Eyes: Conjunctivae are normal. Pupils are equal, round, and reactive to light.  Pulmonary/Chest: Effort normal.  Musculoskeletal: He exhibits no edema.       Right shoulder: He exhibits tenderness.  He has tenderness right trapezius with no neurologic dysfunction. He has no limitation of motion. No ecchymosis or cellulitis.    Neurological: He is alert and oriented to person, place, and time.  Skin: Skin is dry.  Psychiatric: He has a normal mood and affect. His behavior is normal. Thought content normal.      Assessment & Plan:   Haakon was seen today for arm pain.  Diagnoses and all orders for this visit:  Trapezius strain, right, initial encounter  Other orders -     naproxen sodium (ANAPROX DS) 550 MG tablet; Take 1 tablet (550 mg total) by mouth 2 (two) times daily with a meal. -     cyclobenzaprine (FLEXERIL) 10 MG tablet; Take 1 tablet (10 mg total) by mouth 3 (three) times daily as needed for muscle spasms.   I am having Mr. Keel start on naproxen sodium and cyclobenzaprine. I am also having him maintain his potassium chloride, lisinopril-hydrochlorothiazide, amLODipine, and DiphenhydrAMINE HCl (Sleep).  Meds ordered this encounter  Medications  . naproxen sodium (ANAPROX DS) 550 MG tablet    Sig: Take 1 tablet (550 mg total) by mouth 2 (two) times daily with a meal.    Dispense:  40 tablet    Refill:  0  . cyclobenzaprine (FLEXERIL) 10 MG tablet    Sig: Take 1 tablet (10 mg total) by mouth 3 (three) times daily as needed for muscle spasms.    Dispense:  30 tablet    Refill:  0    Appropriate red flag conditions were discussed with the patient as well as actions that should be taken.  Patient expressed his understanding.  Follow-up: Return if symptoms worsen or fail to improve.  Roselee Culver, MD

## 2015-10-22 DIAGNOSIS — G4733 Obstructive sleep apnea (adult) (pediatric): Secondary | ICD-10-CM | POA: Diagnosis not present

## 2015-11-07 ENCOUNTER — Ambulatory Visit: Payer: Medicare Other | Admitting: Physician Assistant

## 2015-11-21 DIAGNOSIS — G4733 Obstructive sleep apnea (adult) (pediatric): Secondary | ICD-10-CM | POA: Diagnosis not present

## 2015-12-21 DIAGNOSIS — G4733 Obstructive sleep apnea (adult) (pediatric): Secondary | ICD-10-CM | POA: Diagnosis not present

## 2016-01-21 DIAGNOSIS — G4733 Obstructive sleep apnea (adult) (pediatric): Secondary | ICD-10-CM | POA: Diagnosis not present

## 2016-01-26 DIAGNOSIS — M62838 Other muscle spasm: Secondary | ICD-10-CM | POA: Diagnosis not present

## 2016-01-26 DIAGNOSIS — M545 Low back pain: Secondary | ICD-10-CM | POA: Diagnosis not present

## 2016-02-08 ENCOUNTER — Ambulatory Visit (INDEPENDENT_AMBULATORY_CARE_PROVIDER_SITE_OTHER): Payer: Medicare Other

## 2016-02-08 ENCOUNTER — Ambulatory Visit (INDEPENDENT_AMBULATORY_CARE_PROVIDER_SITE_OTHER): Payer: Medicare Other | Admitting: Family Medicine

## 2016-02-08 VITALS — BP 154/103 | HR 94 | Temp 98.4°F | Resp 20 | Ht 69.5 in | Wt 308.8 lb

## 2016-02-08 DIAGNOSIS — Z9989 Dependence on other enabling machines and devices: Secondary | ICD-10-CM

## 2016-02-08 DIAGNOSIS — M5442 Lumbago with sciatica, left side: Secondary | ICD-10-CM | POA: Diagnosis not present

## 2016-02-08 DIAGNOSIS — M5441 Lumbago with sciatica, right side: Secondary | ICD-10-CM

## 2016-02-08 DIAGNOSIS — Z9114 Patient's other noncompliance with medication regimen: Secondary | ICD-10-CM

## 2016-02-08 DIAGNOSIS — G4733 Obstructive sleep apnea (adult) (pediatric): Secondary | ICD-10-CM

## 2016-02-08 DIAGNOSIS — M545 Low back pain: Secondary | ICD-10-CM | POA: Diagnosis not present

## 2016-02-08 DIAGNOSIS — Z91148 Patient's other noncompliance with medication regimen for other reason: Secondary | ICD-10-CM

## 2016-02-08 DIAGNOSIS — I10 Essential (primary) hypertension: Secondary | ICD-10-CM | POA: Diagnosis not present

## 2016-02-08 IMAGING — CR DG LUMBAR SPINE COMPLETE 4+V
5 series · 5 of 5 positions shown · non-contrast
Comparison: None.

CLINICAL DATA: One week history of lumbago

EXAM:
LUMBAR SPINE - COMPLETE 4+ VIEW

[AP]
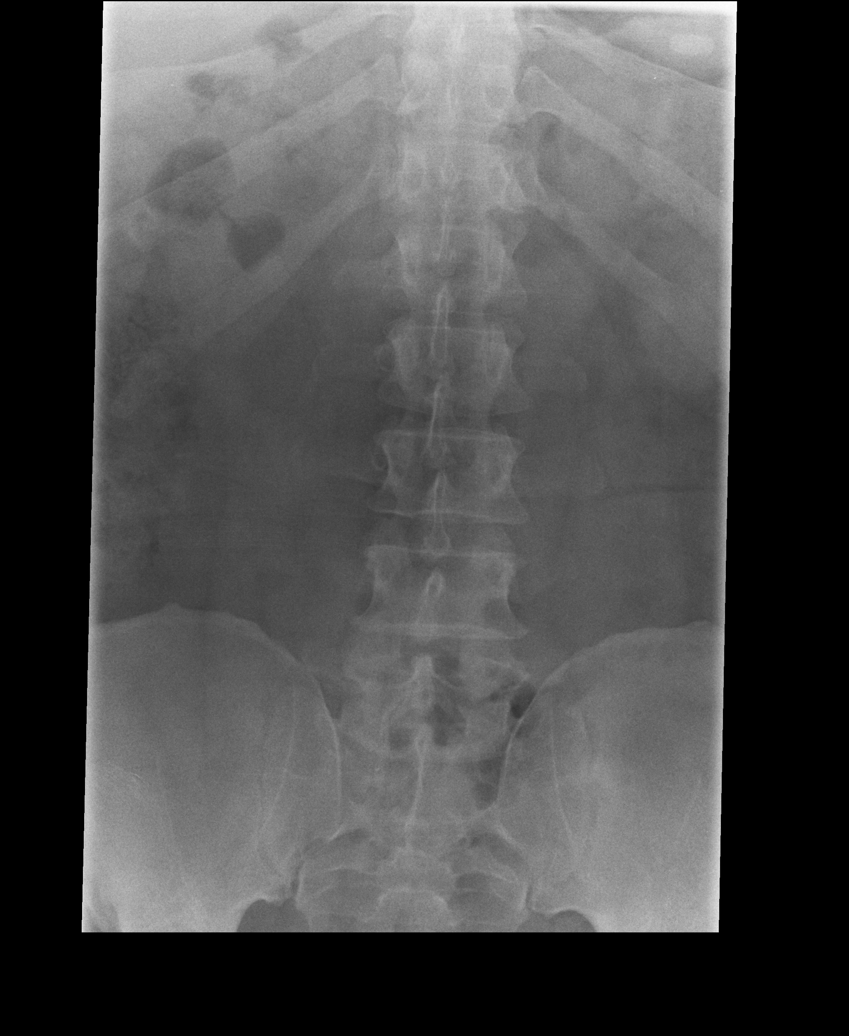

[rpo]
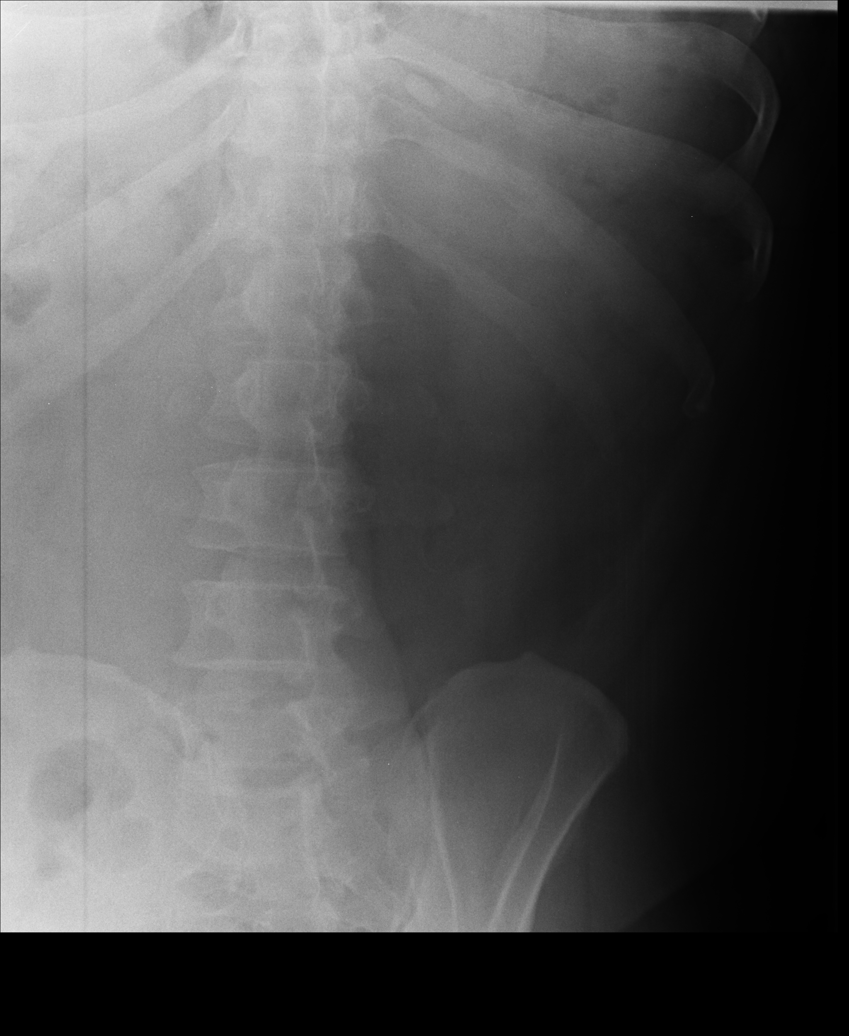

[lpo]
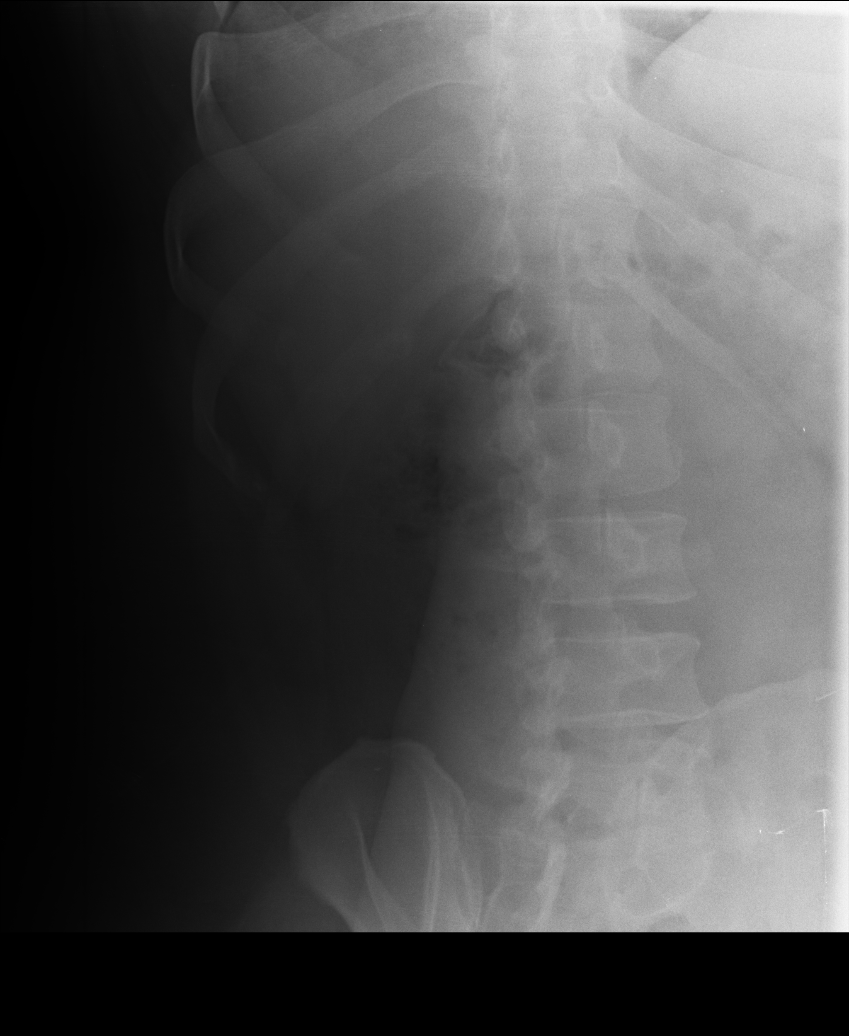

[lateral]
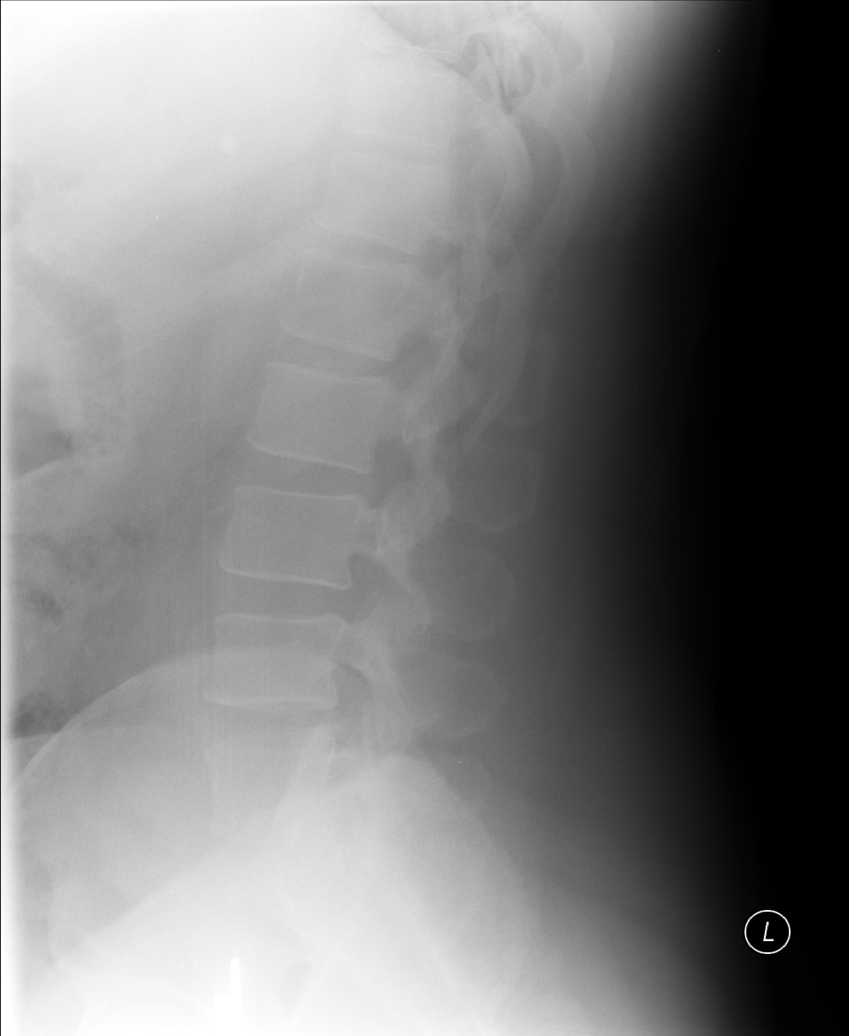

[l5 s1]
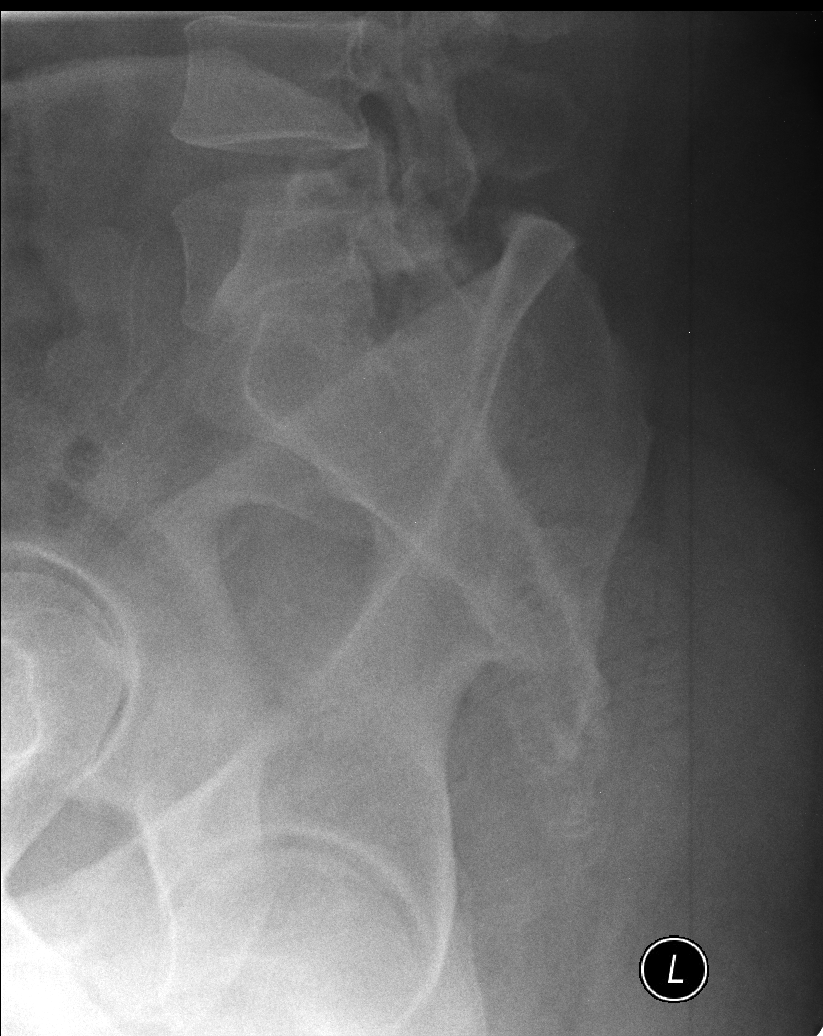

[5 of 5 positions shown; findings below may reference images not displayed]

FINDINGS: Frontal, lateral, spot lumbosacral lateral, and bilateral oblique
views were obtained. There are 5 non-rib-bearing lumbar type
vertebral bodies. There is no fracture or spondylolisthesis. Disc
spaces appear normal. There is facet osteoarthritic change
bilaterally at L5-S1.
IMPRESSION: Facet osteoarthritic change at L5-S1 bilaterally. No appreciable
disc space narrowing. No fracture or spondylolisthesis.

## 2016-02-08 MED ORDER — HYDROCODONE-ACETAMINOPHEN 5-325 MG PO TABS: 1.0000 | ORAL_TABLET | Freq: Three times a day (TID) | ORAL | Status: DC | PRN

## 2016-02-08 MED ORDER — AMLODIPINE BESYLATE 10 MG PO TABS: 10.0000 mg | ORAL_TABLET | Freq: Every day | ORAL | Status: DC

## 2016-02-08 MED ORDER — HYDROCHLOROTHIAZIDE 25 MG PO TABS: 25.0000 mg | ORAL_TABLET | Freq: Every day | ORAL | Status: DC

## 2016-02-08 MED ORDER — LISINOPRIL 40 MG PO TABS: 40.0000 mg | ORAL_TABLET | Freq: Every day | ORAL | Status: DC

## 2016-02-08 MED ORDER — METHYLPREDNISOLONE 4 MG PO TBPK: ORAL_TABLET | ORAL | Status: DC

## 2016-02-08 MED ORDER — CYCLOBENZAPRINE HCL 10 MG PO TABS: 10.0000 mg | ORAL_TABLET | Freq: Three times a day (TID) | ORAL | Status: DC | PRN

## 2016-02-08 NOTE — Progress Notes (Signed)
 Chief Complaint:  Chief Complaint  Patient presents with  . Back Pain    lower back pain x 4 days    HPI: Michael [name] is a 45 y.o. male who reports to [facility] today complaining of back pain 1 week ago. HE went to another urgent care and took a msk relaxer and also another pill which did not help. From his back to left buttcheek, he can;t sit down for a long time, when he lays down on his back or side he is ok but when he is standing or sitting it causes him problems. He has pain hgoign down his leg, his foot feels slightly weak. 10/10 pain , intermittent. No incontinence.  He has not had a back injury beofre. He does not know what instigated this, he did move furniture around 3 weeks ago but had no problems then. No hx of renal stones. He denies dysuria or urethral dc.   He did get an xray but it was normal .   He was given naproxen   Past Medical History  Diagnosis Date  . Hypertension   . Migraine   . Depression     Questionable per records. Not on any medication.   . Schizophrenia ([place])     Questionable per records. Not on any medication.   . Noncompliance   . Anxiety   . Insomnia   . Hx of echocardiogram     a. Echo 12/13/12: Mild LVH, EF 99991111, grade 1 diastolic dysfunction, mild LAE, mild RVE, mild RAE  . OSA (obstructive sleep apnea)     a. Sleep Study 02/2013:  mod OSA, AHI 33 per hour, O2 sat nadir 85%   Past Surgical History  Procedure Laterality Date  . Mandible surgery  1998    metal plate  . Admission  04/2012    Malignant HTN, HA.  CT head negative, cardiology consult.     Social History   Social History  . Marital Status: Divorced    Spouse Name: N/A  . Number of Children: 5  . Years of Education: N/A   Occupational History  . Unemployed//disabled    Social History Main Topics  . Smoking status: Never Smoker   . Smokeless tobacco: Never Used  . Alcohol Use: No  . Drug Use: No  . Sexual Activity: Yes     Comment: per pt's blue health form - 1  sex partner in last 12 months   Other Topics Concern  . None   Social History Narrative   Marital status: single; dating with fiance      Children: 5 children, no grandchildren.     Lives: with fiance, fiance children.      Employment:  Disability for learning disabilities since 2012.         Family History  Problem Relation Age of Onset  . Cancer Father     prostate, colon- age was in his 67's  . Heart disease Father 28    MI   . Hypertension Father   . Cancer Maternal Grandfather   . Cancer Paternal Grandfather   . Diabetes Mother   . Hypertension Mother   . Hypertension Brother    No Known Allergies Prior to Admission medications   Medication Sig Start Date End Date Taking? Authorizing Provider  amLODipine (NORVASC) 10 MG tablet Take 1 tablet (10 mg total) by mouth daily. 10/03/15  Yes Tereasa Coop, PA-C  cyclobenzaprine (FLEXERIL) 10 MG tablet Take 1 tablet (  10 mg total) by mouth 3 (three) times daily as needed for muscle spasms. 10/09/15  Yes Roselee Culver, MD  DiphenhydrAMINE HCl, Sleep, 25 MG CAPS Take 25 mg by mouth at bedtime. 10/03/15  Yes Tereasa Coop, PA-C  lisinopril-hydrochlorothiazide (PRINZIDE,ZESTORETIC) 20-25 MG tablet TAKE 1 TABLET BY MOUTH DAILY 09/25/15  Yes Tereasa Coop, PA-C  naproxen sodium (ANAPROX DS) 550 MG tablet Take 1 tablet (550 mg total) by mouth 2 (two) times daily with a meal. 10/09/15 10/08/16 Yes Roselee Culver, MD  potassium chloride (K-DUR) 10 MEQ tablet TAKE 1 TABLET(10 MEQ) BY MOUTH DAILY 09/13/15  Yes Tereasa Coop, PA-C     ROS: The patient denies fevers, chills, night sweats, unintentional weight loss, chest pain, palpitations, wheezing, dyspnea on exertion, nausea, vomiting, abdominal pain, dysuria, hematuria, melena,   All other systems have been reviewed and were otherwise negative with the exception of those mentioned in the HPI and as above.    PHYSICAL EXAM: Filed Vitals:   02/08/16 1419 02/08/16 1425  BP:  180/120 154/103  Pulse: 94   Temp: 98.4 F (36.9 C)   Resp: 20    Body mass index is 44.96 kg/(m^2).   General: Alert, no acute distress HEENT:  Normocephalic, atraumatic, oropharynx patent. EOMI, PERRLA Cardiovascular:  Regular rate and rhythm, no rubs murmurs or gallops.  No Carotid bruits, radial pulse intact. No pedal edema.  Respiratory: Clear to auscultation bilaterally.  No wheezes, rales, or rhonchi.  No cyanosis, no use of accessory musculature Abdominal: No organomegaly, abdomen is soft and non-tender, positive bowel sounds. No masses. Skin: No rashes. Neurologic: Facial musculature symmetric. Psychiatric: Patient acts appropriately throughout our interaction. Lymphatic: No cervical or submandibular lymphadenopathy Musculoskeletal: Gait intact. No edema, tenderness + paramsk tenderness  Full ROM 5/5 strength, 2/2 DTRs No saddle anesthesia Straight leg negative Hip and knee exam--normal     LABS: Results for orders placed or performed in visit on [id number]  Basic metabolic panel  Result Value Ref Range   Sodium 139 135 - 146 mmol/L   Potassium 3.5 3.5 - 5.3 mmol/L   Chloride 102 98 - 110 mmol/L   CO2 27 20 - 31 mmol/L   Glucose, Bld 99 65 - 99 mg/dL   BUN 16 7 - 25 mg/dL   Creat 1.01 0.60 - 1.35 mg/dL   Calcium 8.8 8.6 - 10.3 mg/dL  CBC  Result Value Ref Range   WBC 6.2 4.0 - 10.5 K/uL   RBC 4.78 4.22 - 5.81 MIL/uL   Hemoglobin 13.1 13.0 - 17.0 g/dL   HCT 39.6 39.0 - 52.0 %   MCV 82.8 78.0 - 100.0 fL   MCH 27.4 26.0 - 34.0 pg   MCHC 33.1 30.0 - 36.0 g/dL   RDW 15.9 (H) 11.5 - 15.5 %   Platelets 230 150 - 400 K/uL   MPV 10.7 8.6 - 12.4 fL  POCT urinalysis dipstick  Result Value Ref Range   Color, UA yellow yellow   Clarity, UA clear clear   Glucose, UA negative negative   Bilirubin, UA negative negative   Ketones, POC UA negative negative   Spec Grav, UA 1.025    Blood, UA negative negative   pH, UA 5.5    Protein Ur, POC negative negative    Urobilinogen, UA 0.2    Nitrite, UA Negative Negative   Leukocytes, UA Negative Negative     EKG/XRAY:   Primary read interpreted by Dr. Marin Comment at [facility].  ASSESSMENT/PLAN: Encounter Diagnoses  Name Primary?  . Bilateral low back pain with sciatica, sciatica laterality unspecified Yes  . Essential hypertension   . Non compliance w medication regimen   . OSA on CPAP   . Uncontrolled stage 2 hypertension    Icnrease Lisinopril from 20 to 40 mg Cont with HCTZ 25 mg, Amlodipine 10 mg He was advise to get his CPAP adjusted since not wearing it due to increase pressure He was given medrol dose pack, flexeril and also norco for pain [place] DB pulled, no illegal activities.  Fu with Despina Arias PA-C in 1 month with BP logs.   Gross sideeffects, risk and benefits, and alternatives of medications d/w patient. Patient is aware that all medications have potential sideeffects and we are unable to predict every sideeffect or drug-drug interaction that may occur.    DO  02/08/2016 4:02 PM

## 2016-02-08 NOTE — Patient Instructions (Addendum)
Because you received an x-ray today, you will receive an invoice from [facility]. Please contact [facility] at [phone] with questions or concerns regarding your invoice. Our billing staff will not be able to assist you with those questions. °

## 2016-02-11 ENCOUNTER — Telehealth: Payer: Self-pay | Admitting: Pulmonary Disease

## 2016-02-11 NOTE — Telephone Encounter (Signed)
ATC and received fast busy signal. [facility]

## 2016-02-11 NOTE — Telephone Encounter (Signed)
LMOMTCB x1 for pt Pt has not been seen since 2014

## 2016-02-12 NOTE — Telephone Encounter (Signed)
Spoke with pt, states his cpap is making a funny smell and needs a new cpap asap.  I advised pt that he has not been seen since 2014 and will need an ov before we can order anything for him.  Pt scheduled tomorrow with [facility] as an acute pt.  Nothing further needed.

## 2016-02-12 NOTE — Telephone Encounter (Signed)
LM for patient to return call.

## 2016-02-12 NOTE — Telephone Encounter (Signed)
[phone] pt calling back

## 2016-02-13 ENCOUNTER — Ambulatory Visit (INDEPENDENT_AMBULATORY_CARE_PROVIDER_SITE_OTHER): Payer: Medicare Other | Admitting: Pulmonary Disease

## 2016-02-13 ENCOUNTER — Encounter: Payer: Self-pay | Admitting: Pulmonary Disease

## 2016-02-13 VITALS — BP 158/110 | HR 110 | Ht 69.0 in | Wt 310.0 lb

## 2016-02-13 DIAGNOSIS — Z6841 Body Mass Index (BMI) 40.0 and over, adult: Secondary | ICD-10-CM | POA: Diagnosis not present

## 2016-02-13 DIAGNOSIS — J4520 Mild intermittent asthma, uncomplicated: Secondary | ICD-10-CM

## 2016-02-13 DIAGNOSIS — J45909 Unspecified asthma, uncomplicated: Secondary | ICD-10-CM | POA: Insufficient documentation

## 2016-02-13 DIAGNOSIS — G4733 Obstructive sleep apnea (adult) (pediatric): Secondary | ICD-10-CM | POA: Diagnosis not present

## 2016-02-13 MED ORDER — ALBUTEROL SULFATE HFA 108 (90 BASE) MCG/ACT IN AERS: 2.0000 | INHALATION_SPRAY | Freq: Four times a day (QID) | RESPIRATORY_TRACT | Status: DC | PRN

## 2016-02-13 NOTE — Patient Instructions (Addendum)
We will obtain a new sleep study. You may use the albuterol inhaler 2 puffs up to every 6 hours as needed for shortness of breath or wheezing Please follow up in 3 months with Dr. Halford Chessman.

## 2016-02-13 NOTE — Progress Notes (Signed)
Current Outpatient Prescriptions on File Prior to Visit  Medication Sig  . amLODipine (NORVASC) 10 MG tablet Take 1 tablet (10 mg total) by mouth daily.  . cyclobenzaprine (FLEXERIL) 10 MG tablet Take 1 tablet (10 mg total) by mouth 3 (three) times daily as needed for muscle spasms.  . hydrochlorothiazide (HYDRODIURIL) 25 MG tablet Take 1 tablet (25 mg total) by mouth daily.  Marland Kitchen HYDROcodone-acetaminophen (NORCO) 5-325 MG tablet Take 1 tablet by mouth every 8 (eight) hours as needed for moderate pain. Take with stool softener. No extra tylenol with this.  Marland Kitchen lisinopril (PRINIVIL,ZESTRIL) 40 MG tablet Take 1 tablet (40 mg total) by mouth daily.  . naproxen sodium (ANAPROX DS) 550 MG tablet Take 1 tablet (550 mg total) by mouth 2 (two) times daily with a meal.  . potassium chloride (K-DUR) 10 MEQ tablet TAKE 1 TABLET(10 MEQ) BY MOUTH DAILY   No current facility-administered medications on file prior to visit.    Chief Complaint  Patient presents with  . Follow-up    Former [name] patient: Has not CPAP d/t odd smell when turning on. Pt reports keeping filters changed. DME: [facility] Pt reports elevated BP without CPAP use.    Tests Echo 12/13/12 >> EF 50 to [id number], grade 1 diastolic dysfx PSG 123456 >> AHI 33, SpO2 low 85%  Past medical hx HTN, HA, Depression, Schizophrenia, Anxiety, Insomnia  Past surgical hx, Allergies, Family hx, Social hx all reviewed.  Vital Signs BP 158/110 mmHg  Pulse 110  Ht 5\' 9"  (1.753 m)  Wt 310 lb (140.615 kg)  BMI 45.76 kg/m2  SpO2 95%  History of Present Illness Michael [name] is a 45 y.o. male with OSA, uncontrolled HTN presenting for evaluation of OSA.  Started 1 year ago. Has not worn his CPAP during this time. Feels like suffocating when he dozes off with it on. There is a chemical smell. Weight was 305 in 2014 when he last saw pulmonology. He has daytime drowsiness. He has dyspnea with exertion - 5 blocks. None at rest. Has wheezing when there is dust and/or pets  around. Uses benadryl with relief. No fevers or chills, chest pain. No history of seasonal allergies or childhood asthma.  Physical Exam  General - No distress ENT - No sinus tenderness, no oral exudate, no LAN Cardiac - s1s2 regular, no murmur Chest - No wheeze/rales/dullness Back - No focal tenderness Abd - Soft, non-tender Ext - No edema Neuro - Normal strength Skin - No rashes Psych - normal mood, and behavior   Assessment/Plan 45 year old man with OSA noncompliant of CPAP, uncontrolled HTN presenting for evaluation of OSA.  OSA: Set pressure was previously 10. -Split night study  Probable asthma: triggered by dust and pets -Albuterol inhaler 2 puffs every 6 hours as needed for wheezing or shortness of breath.  Health Maintenance: Received influenza vaccine 10/03/2015.  Patient Instructions  We will obtain a new sleep study. You may use the albuterol inhaler 2 puffs up to every 6 hours as needed for shortness of breath or wheezing Please follow up in 3 months with Dr. Halford Chessman.    Jacques Earthly, MD  Internal Medicine PGY-2   Reviewed above.  He was seen previously by Dr. Gwenette Greet.  He was on CPAP 10 cm H2O.  He didn't feel like he was getting enough pressure.  He also had bad smell from his machine  He has not used CPAP for at least 1 year.  There has been concern about his blood pressure  being too high, and he was told he should revisit using CPAP.  He still snores, and his wife tells him he stops breathing while asleep.  He is sleepy during the day.  He has noticed intermittent episodes of wheezing when exposed to dust or animals.  MP 3, 2+ tonsils, enlarged tongue with scalloped border.  HR regular.  No wheeze.  Assessment/plan: OSA. - will need to repeat sleep study, and then likely restart CPAP therapy  Wheezing with ?allergies/asthma. - will give trial of albuterol - might need additional testing with PFT and allergy testing >> will re-assess at next  visit  Morbid obesity. Plan: - discussed importance of weight loss   Chesley Mires, MD [place] 02/13/2016, 3:09 PM Pager:  [phone] After 3pm call: [phone]

## 2016-02-20 DIAGNOSIS — G4733 Obstructive sleep apnea (adult) (pediatric): Secondary | ICD-10-CM | POA: Diagnosis not present

## 2016-03-04 ENCOUNTER — Ambulatory Visit (INDEPENDENT_AMBULATORY_CARE_PROVIDER_SITE_OTHER): Payer: Medicare Other | Admitting: Family Medicine

## 2016-03-04 VITALS — BP 165/122 | HR 81 | Temp 98.3°F | Resp 18 | Ht 69.0 in | Wt 307.6 lb

## 2016-03-04 DIAGNOSIS — I10 Essential (primary) hypertension: Secondary | ICD-10-CM

## 2016-03-04 DIAGNOSIS — G4733 Obstructive sleep apnea (adult) (pediatric): Secondary | ICD-10-CM

## 2016-03-04 DIAGNOSIS — M5441 Lumbago with sciatica, right side: Secondary | ICD-10-CM | POA: Diagnosis not present

## 2016-03-04 DIAGNOSIS — E669 Obesity, unspecified: Secondary | ICD-10-CM

## 2016-03-04 MED ORDER — CYCLOBENZAPRINE HCL 10 MG PO TABS: 5.0000 mg | ORAL_TABLET | Freq: Three times a day (TID) | ORAL | Status: DC | PRN

## 2016-03-04 MED ORDER — HYDROCODONE-ACETAMINOPHEN 5-325 MG PO TABS: 1.0000 | ORAL_TABLET | Freq: Three times a day (TID) | ORAL | Status: DC | PRN

## 2016-03-04 NOTE — Patient Instructions (Addendum)
Restart amlodipine once per day in addition to the hydrochlorothiazide and lisinopril at 40mg  each day. Follow up with Legrand Como in next 2 weeks - Keep a record of your blood pressures outside of the office and bring them to the next office visit. Return to the clinic or go to the nearest emergency room if any of your symptoms worsen or new symptoms occur.  Keep follow up for sleep study as scheduled.   I suspect you do have sciatica that has been persistent, but due to your elevated blood pressure today, we are limited in some of the medications I would use to treat this. Restart amlodipine as above, Flexeril if needed for muscle spasms, and if needed for increased pain, can take hydrocodone. Be careful combining the hydrocodone and Flexeril.  We will refer you to orthopedics, but return here if worsening sooner.  Hypertension Hypertension, commonly called high blood pressure, is when the force of blood pumping through your arteries is too strong. Your arteries are the blood vessels that carry blood from your heart throughout your body. A blood pressure reading consists of a higher number over a lower number, such as 110/72. The higher number (systolic) is the pressure inside your arteries when your heart pumps. The lower number (diastolic) is the pressure inside your arteries when your heart relaxes. Ideally you want your blood pressure below 120/80. Hypertension forces your heart to work harder to pump blood. Your arteries may become narrow or stiff. Having untreated or uncontrolled hypertension can cause heart attack, stroke, kidney disease, and other problems. RISK FACTORS Some risk factors for high blood pressure are controllable. Others are not.  Risk factors you cannot control include:   Race. You may be at higher risk if you are African American.  Age. Risk increases with age.  Gender. Men are at higher risk than women before age 53 years. After age 56, women are at higher risk than  men. Risk factors you can control include:  Not getting enough exercise or physical activity.  Being overweight.  Getting too much fat, sugar, calories, or salt in your diet.  Drinking too much alcohol. SIGNS AND SYMPTOMS Hypertension does not usually cause signs or symptoms. Extremely high blood pressure (hypertensive crisis) may cause headache, anxiety, shortness of breath, and nosebleed. DIAGNOSIS To check if you have hypertension, your health care provider will measure your blood pressure while you are seated, with your arm held at the level of your heart. It should be measured at least twice using the same arm. Certain conditions can cause a difference in blood pressure between your right and left arms. A blood pressure reading that is higher than normal on one occasion does not mean that you need treatment. If it is not clear whether you have high blood pressure, you may be asked to return on a different day to have your blood pressure checked again. Or, you may be asked to monitor your blood pressure at home for 1 or more weeks. TREATMENT Treating high blood pressure includes making lifestyle changes and possibly taking medicine. Living a healthy lifestyle can help lower high blood pressure. You may need to change some of your habits. Lifestyle changes may include:  Following the DASH diet. This diet is high in fruits, vegetables, and whole grains. It is low in salt, red meat, and added sugars.  Keep your sodium intake below 2,300 mg per day.  Getting at least 30-45 minutes of aerobic exercise at least 4 times per week.  Losing  weight if necessary.  Not smoking.  Limiting alcoholic beverages.  Learning ways to reduce stress. Your health care provider may prescribe medicine if lifestyle changes are not enough to get your blood pressure under control, and if one of the following is true:  You are 39-52 years of age and your systolic blood pressure is above 140.  You are 15  years of age or older, and your systolic blood pressure is above 150.  Your diastolic blood pressure is above 90.  You have diabetes, and your systolic blood pressure is over [id number] or your diastolic blood pressure is over 90.  You have kidney disease and your blood pressure is above 140/90.  You have heart disease and your blood pressure is above 140/90. Your personal target blood pressure may vary depending on your medical conditions, your age, and other factors. HOME CARE INSTRUCTIONS  Have your blood pressure rechecked as directed by your health care provider.   Take medicines only as directed by your health care provider. Follow the directions carefully. Blood pressure medicines must be taken as prescribed. The medicine does not work as well when you skip doses. Skipping doses also puts you at risk for problems.  Do not smoke.   Monitor your blood pressure at home as directed by your health care provider. SEEK MEDICAL CARE IF:   You think you are having a reaction to medicines taken.  You have recurrent headaches or feel dizzy.  You have swelling in your ankles.  You have trouble with your vision. SEEK IMMEDIATE MEDICAL CARE IF:  You develop a severe headache or confusion.  You have unusual weakness, numbness, or feel faint.  You have severe chest or abdominal pain.  You vomit repeatedly.  You have trouble breathing. MAKE SURE YOU:   Understand these instructions.  Will watch your condition.  Will get help right away if you are not doing well or get worse.   This information is not intended to replace advice given to you by your health care provider. Make sure you discuss any questions you have with your health care provider.   Document Released: 12/15/2005 Document Revised: 05/01/2015 Document Reviewed: 10/07/2013 Elsevier Interactive Patient Education 2016 [employer]  Sciatica Sciatica is pain, weakness, numbness, or tingling along the path of the  sciatic nerve. The nerve starts in the lower back and runs down the back of each leg. The nerve controls the muscles in the lower leg and in the back of the knee, while also providing sensation to the back of the thigh, lower leg, and the sole of your foot. Sciatica is a symptom of another medical condition. For instance, nerve damage or certain conditions, such as a herniated disk or bone spur on the spine, pinch or put pressure on the sciatic nerve. This causes the pain, weakness, or other sensations normally associated with sciatica. Generally, sciatica only affects one side of the body. CAUSES   Herniated or slipped disc.  Degenerative disk disease.  A pain disorder involving the narrow muscle in the buttocks (piriformis syndrome).  Pelvic injury or fracture.  Pregnancy.  Tumor (rare). SYMPTOMS  Symptoms can vary from mild to very severe. The symptoms usually travel from the low back to the buttocks and down the back of the leg. Symptoms can include:  Mild tingling or dull aches in the lower back, leg, or hip.  Numbness in the back of the calf or sole of the foot.  Burning sensations in the lower back, leg, or  hip.  Sharp pains in the lower back, leg, or hip.  Leg weakness.  Severe back pain inhibiting movement. These symptoms may get worse with coughing, sneezing, laughing, or prolonged sitting or standing. Also, being overweight may worsen symptoms. DIAGNOSIS  Your caregiver will perform a physical exam to look for common symptoms of sciatica. He or she may ask you to do certain movements or activities that would trigger sciatic nerve pain. Other tests may be performed to find the cause of the sciatica. These may include:  Blood tests.  X-rays.  Imaging tests, such as an MRI or CT scan. TREATMENT  Treatment is directed at the cause of the sciatic pain. Sometimes, treatment is not necessary and the pain and discomfort goes away on its own. If treatment is needed, your  caregiver may suggest:  Over-the-counter medicines to relieve pain.  Prescription medicines, such as anti-inflammatory medicine, muscle relaxants, or narcotics.  Applying heat or ice to the painful area.  Steroid injections to lessen pain, irritation, and inflammation around the nerve.  Reducing activity during periods of pain.  Exercising and stretching to strengthen your abdomen and improve flexibility of your spine. Your caregiver may suggest losing weight if the extra weight makes the back pain worse.  Physical therapy.  Surgery to eliminate what is pressing or pinching the nerve, such as a bone spur or part of a herniated disk. HOME CARE INSTRUCTIONS   Only take over-the-counter or prescription medicines for pain or discomfort as directed by your caregiver.  Apply ice to the affected area for 20 minutes, 3-4 times a day for the first 48-72 hours. Then try heat in the same way.  Exercise, stretch, or perform your usual activities if these do not aggravate your pain.  Attend physical therapy sessions as directed by your caregiver.  Keep all follow-up appointments as directed by your caregiver.  Do not wear high heels or shoes that do not provide proper support.  Check your mattress to see if it is too soft. A firm mattress may lessen your pain and discomfort. SEEK IMMEDIATE MEDICAL CARE IF:   You lose control of your bowel or bladder (incontinence).  You have increasing weakness in the lower back, pelvis, buttocks, or legs.  You have redness or swelling of your back.  You have a burning sensation when you urinate.  You have pain that gets worse when you lie down or awakens you at night.  Your pain is worse than you have experienced in the past.  Your pain is lasting longer than 4 weeks.  You are suddenly losing weight without reason. MAKE SURE YOU:  Understand these instructions.  Will watch your condition.  Will get help right away if you are not doing well  or get worse.   This information is not intended to replace advice given to you by your health care provider. Make sure you discuss any questions you have with your health care provider.   Document Released: 12/09/2001 Document Revised: 09/05/2015 Document Reviewed: 04/25/2012 Elsevier Interactive Patient Education [employer].

## 2016-03-04 NOTE — Progress Notes (Signed)
Subjective:  This chart was scribed for [name] MD, by Tamsen Roers, at [facility].  This patient was seen in room  6  and the patient's care was started at 9:25 AM.   Chief Complaint  Patient presents with  . Back Pain    low back pain     Patient ID: Michael [name], male    DOB: Jul 19, 1971, 45 y.o.   MRN: [id number]  HPI  HPI Comments: Michael [name] is a 45 y.o. male with a history of hypertension who presents to the [facility] complaining of back pain.   Back pain: Patient has a history of lower back pain- He was seen recently for lower back pain in Feb 10 by Dr. Marin Comment (started 1 week ago at that time) had been seen by an Urgent [facility] prior.  No relief with muscle relaxer. He was diagnosed with sciatica. Treated with medrol dose pack, Flexeril, Naproxen  and Hydrocodone He had a lumbar spine xray on Feb 10th with arthritis changes at L5-S1.  ------  Patient notes that he has been active with walking and going to the gym but feels like the muscle relaxers are not very effective for him as his back pain has worsened.  He took the Medrol dose pack which relieved his pain for a while but started again 1.5 weeks later after stopping.  He feels like his back pain has worsened as it now radiates down to his right thigh.  He also has pain when he coughs or urinates.  He denies any loss of control urinating or defecation.  He denies any recent injury and states that he has never had back surgery.  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  Patient ran out of his flexeril about a week and a half ago. Patient has been taking Aleve (2 every 12 hours.    Hypertension BP was 154/103 at visit with Dr. Marin Comment. His Lisinopril was increased from 20 to 40 mg.  He was continued on Hydrochlorothiazide and Norvasc same doses. Advised to follow up in 1 month with blood pressure readings. He is taking  Potassium 10 Meq once per day.  ------ He has occasional stiffness in his shoulder. He denies difficulty speaking, chest pain or headaches . He last took his blood pressure medication this morning. (Lisinopril 40 mg and 25 mg Hydrochlorothiazide).  He states that he has not been taking his Amlodipine and thinks that there was a misunderstanding as he stopped taking it.    Obstructive Sleep Apnea  Has CPAP prescribed. Was not wearing it at last visit. Office visit with pulmonary Feb 15th. Planned on repeat sleep study, then likely re starting on CPAP. And by that note he was given a trial of albuterol with plan on further testing for asthma at next visit.  ------ Patient states that his next sleep study will be in April.    Morbid Obesity: This was discussed or recommended at pulmonary visit.  Wt Readings from Last 3 Encounters:  03/04/16 307 lb 9.6 oz (139.526 kg)  02/13/16 310 lb (140.615 kg)  02/08/16 308 lb 12.8 oz (140.071 kg)  ----- Patient feels that he has gained weight as he has not been able to go to the gym due to his back.    Patient Active Problem List   Diagnosis Date Noted  . Asthma 02/13/2016  . Non compliance w  medication regimen 06/13/2015  . OSA (obstructive sleep apnea) 04/06/2013  . Abnormal EKG 11/08/2012  . Hypertension   . HTN (hypertension), malignant 04/29/2012  . Migraine headache with aura 04/29/2012   Past Medical History  Diagnosis Date  . Hypertension   . Migraine   . Depression     Questionable per records. Not on any medication.   . Schizophrenia ([place])     Questionable per records. Not on any medication.   . Noncompliance   . Anxiety   . Insomnia   . Hx of echocardiogram     a. Echo 12/13/12: Mild LVH, EF 99991111, grade 1 diastolic dysfunction, mild LAE, mild RVE, mild RAE  . OSA (obstructive sleep apnea)     a. Sleep Study 02/2013:  mod OSA, AHI 33 per hour, O2 sat nadir 85%   Past Surgical History  Procedure Laterality Date  . Mandible  surgery  1998    metal plate  . Admission  04/2012    Malignant HTN, HA.  CT head negative, cardiology consult.     No Known Allergies Prior to Admission medications   Medication Sig Start Date End Date Taking? Authorizing Provider  albuterol (PROVENTIL HFA;VENTOLIN HFA) 108 (90 Base) MCG/ACT inhaler Inhale 2 puffs into the lungs every 6 (six) hours as needed for wheezing or shortness of breath. 02/13/16  Yes Chesley Mires, MD  hydrochlorothiazide (HYDRODIURIL) 25 MG tablet Take 1 tablet (25 mg total) by mouth daily. 02/08/16  Yes Thao P Le, DO  lisinopril (PRINIVIL,ZESTRIL) 40 MG tablet Take 1 tablet (40 mg total) by mouth daily. 02/08/16  Yes Thao P Le, DO  potassium chloride (K-DUR) 10 MEQ tablet TAKE 1 TABLET(10 MEQ) BY MOUTH DAILY 09/13/15  Yes Tereasa Coop, PA-C  amLODipine (NORVASC) 5 MG tablet  01/03/16   Historical Provider, MD  cyclobenzaprine (FLEXERIL) 10 MG tablet Take 1 tablet (10 mg total) by mouth 3 (three) times daily as needed for muscle spasms. Patient not taking: Reported on 03/04/2016 02/08/16   Thao P Le, DO  HYDROcodone-acetaminophen (NORCO) 5-325 MG tablet Take 1 tablet by mouth every 8 (eight) hours as needed for moderate pain. Take with stool softener. No extra tylenol with this. Patient not taking: Reported on 03/04/2016 02/08/16   Thao P Le, DO  lisinopril-hydrochlorothiazide (PRINZIDE,ZESTORETIC) 20-25 MG tablet TK 1 T PO D 01/03/16   Historical Provider, MD  naproxen sodium (ANAPROX DS) 550 MG tablet Take 1 tablet (550 mg total) by mouth 2 (two) times daily with a meal. Patient not taking: Reported on 03/04/2016 10/09/15 10/08/16  Roselee Culver, MD   Social History   Social History  . Marital Status: Divorced    Spouse Name: N/A  . Number of Children: 5  . Years of Education: N/A   Occupational History  . Unemployed//disabled    Social History Main Topics  . Smoking status: Never Smoker   . Smokeless tobacco: Never Used  . Alcohol Use: No  . Drug Use: No  .  Sexual Activity: Yes     Comment: per pt's blue [employer] form - 1 sex partner in last 12 months   Other Topics Concern  . Not on file   Social History Narrative   Marital status: single; dating with fiance      Children: 5 children, no grandchildren.     Lives: with fiance, fiance children.      Employment:  Disability for learning disabilities since 2012.  Review of Systems  Constitutional: Negative for fever and chills.  Respiratory: Negative for cough, choking and shortness of breath.   Gastrointestinal: Negative for nausea and vomiting.  Musculoskeletal: Positive for back pain. Negative for neck pain and neck stiffness.  Neurological: Negative for seizures, syncope, speech difficulty and headaches.       Objective:   Physical Exam  Constitutional: He is oriented to person, place, and time. He appears well-developed and well-nourished.  HENT:  Head: Normocephalic and atraumatic.  Eyes: EOM are normal. Pupils are equal, round, and reactive to light.  No nystagmus   Neck: No JVD present. Carotid bruit is not present.  Cardiovascular: Normal rate, regular rhythm and normal heart sounds.   No murmur heard. Pulmonary/Chest: Effort normal and breath sounds normal. He has no rales.  Musculoskeletal: He exhibits no edema.  No midline bony tenderness skin is intact.  Slight tenderness along the right paraspinal as well as into the right sciatic notch.  Flexion is guarded approximately 30 degrees. Decreased right lateral flexion Decreased extension.  Able to heel and toe walk without difficulty.  Equal rotation.  Positive straight leg raise.   Neurological: He is alert and oriented to person, place, and time. He displays a negative Romberg sign. He displays no Babinski's sign on the right side. He displays no Babinski's sign on the left side.  Reflex Scores:      Patellar reflexes are 2+ on the right side and 2+ on the left side.      Achilles reflexes are 2+ on  the right side and 2+ on the left side. Negative rhomberg No pronator drift., Normal finger to nose, non focal.     Skin: Skin is warm and dry.  Psychiatric: He has a normal mood and affect.  Vitals reviewed.   Filed Vitals:   03/04/16 0906 03/04/16 0913  BP: 160/118 165/122  Pulse: 81   Temp: 98.3 F (36.8 C)   TempSrc: Oral   Resp: 18   Height: 5\' 9"  (1.753 m)   Weight: 307 lb 9.6 oz (139.526 kg)   SpO2: 98%          Assessment & Plan:   Michael [name] is a 45 y.o. male Right-sided low back pain with right-sided sciatica - Plan: cyclobenzaprine (FLEXERIL) 10 MG tablet, HYDROcodone-acetaminophen (NORCO) 5-325 MG tablet, Ambulatory referral to Orthopedic Surgery  -Possible HNP, versus degenerative disc disease with flare.  Would consider repeat prednisone, but with uncontrolled hypertension, this and NSAIDs were deferred at this time.   -Flexeril 5-10 mg 3 times a day when necessary.  -Hydrocodone 5/325 when necessary breakthrough pain. Side effects discussed, and use only as needed.  -Refer to orthopedics, RTC/ER precautions if worsening sooner.  Essential hypertension  -Uncontrolled. Some confusion regarding medications. Advised him to restart amlodipine 10 mg daily, continue lisinopril 40 mg daily, and HCTZ 25 mg daily. Monitor home blood pressures, follow-up with Philis Fendt, PA-C within the next 2 weeks to assess control. Sooner if worse. ER/911 precautions given for any signs or symptoms of stroke or ACS.  OSA (obstructive sleep apnea)  -Planning on repeat sleep study. Restart CPAP may also assist with his hypertension control.  Obesity  -Some improvement since previous visit, limited in exercise due to back pain for right now. Can discuss diet and portion control further at follow-up visit.  Meds ordered this encounter  Medications  . amLODipine (NORVASC) 5 MG tablet    Sig:     Refill:  4  .  lisinopril-hydrochlorothiazide (PRINZIDE,ZESTORETIC) 20-25 MG tablet      Sig: TK 1 T PO D    Refill:  4  . cyclobenzaprine (FLEXERIL) 10 MG tablet    Sig: Take 0.5-1 tablets (5-10 mg total) by mouth 3 (three) times daily as needed for muscle spasms.    Dispense:  30 tablet    Refill:  0  . HYDROcodone-acetaminophen (NORCO) 5-325 MG tablet    Sig: Take 1 tablet by mouth every 8 (eight) hours as needed for moderate pain. Take with stool softener. No extra tylenol with this.    Dispense:  30 tablet    Refill:  0   Patient Instructions  Restart amlodipine once per day in addition to the hydrochlorothiazide and lisinopril at 40mg  each day. Follow up with Legrand Como in next 2 weeks - Keep a record of your blood pressures outside of the office and bring them to the next office visit. Return to the clinic or go to the nearest emergency room if any of your symptoms worsen or new symptoms occur.  Keep follow up for sleep study as scheduled.   I suspect you do have sciatica that has been persistent, but due to your elevated blood pressure today, we are limited in some of the medications I would use to treat this. Restart amlodipine as above, Flexeril if needed for muscle spasms, and if needed for increased pain, can take hydrocodone. Be careful combining the hydrocodone and Flexeril.  We will refer you to orthopedics, but return here if worsening sooner.  Hypertension Hypertension, commonly called high blood pressure, is when the force of blood pumping through your arteries is too strong. Your arteries are the blood vessels that carry blood from your heart throughout your body. A blood pressure reading consists of a higher number over a lower number, such as 110/72. The higher number (systolic) is the pressure inside your arteries when your heart pumps. The lower number (diastolic) is the pressure inside your arteries when your heart relaxes. Ideally you want your blood pressure below 120/80. Hypertension forces your heart to work harder to pump blood. Your arteries may become  narrow or stiff. Having untreated or uncontrolled hypertension can cause heart attack, stroke, kidney disease, and other problems. RISK FACTORS Some risk factors for high blood pressure are controllable. Others are not.  Risk factors you cannot control include:   Race. You may be at higher risk if you are African American.  Age. Risk increases with age.  Gender. Men are at higher risk than women before age 108 years. After age 80, women are at higher risk than men. Risk factors you can control include:  Not getting enough exercise or physical activity.  Being overweight.  Getting too much fat, sugar, calories, or salt in your diet.  Drinking too much alcohol. SIGNS AND SYMPTOMS Hypertension does not usually cause signs or symptoms. Extremely high blood pressure (hypertensive crisis) may cause headache, anxiety, shortness of breath, and nosebleed. DIAGNOSIS To check if you have hypertension, your health care provider will measure your blood pressure while you are seated, with your arm held at the level of your heart. It should be measured at least twice using the same arm. Certain conditions can cause a difference in blood pressure between your right and left arms. A blood pressure reading that is higher than normal on one occasion does not mean that you need treatment. If it is not clear whether you have high blood pressure, you may be asked to return on  a different day to have your blood pressure checked again. Or, you may be asked to monitor your blood pressure at home for 1 or more weeks. TREATMENT Treating high blood pressure includes making lifestyle changes and possibly taking medicine. Living a healthy lifestyle can help lower high blood pressure. You may need to change some of your habits. Lifestyle changes may include:  Following the DASH diet. This diet is high in fruits, vegetables, and whole grains. It is low in salt, red meat, and added sugars.  Keep your sodium intake below  2,300 mg per day.  Getting at least 30-45 minutes of aerobic exercise at least 4 times per week.  Losing weight if necessary.  Not smoking.  Limiting alcoholic beverages.  Learning ways to reduce stress. Your health care provider may prescribe medicine if lifestyle changes are not enough to get your blood pressure under control, and if one of the following is true:  You are 45-47 years of age and your systolic blood pressure is above 140.  You are 26 years of age or older, and your systolic blood pressure is above 150.  Your diastolic blood pressure is above 90.  You have diabetes, and your systolic blood pressure is over [id number] or your diastolic blood pressure is over 90.  You have kidney disease and your blood pressure is above 140/90.  You have heart disease and your blood pressure is above 140/90. Your personal target blood pressure may vary depending on your medical conditions, your age, and other factors. HOME CARE INSTRUCTIONS  Have your blood pressure rechecked as directed by your health care provider.   Take medicines only as directed by your health care provider. Follow the directions carefully. Blood pressure medicines must be taken as prescribed. The medicine does not work as well when you skip doses. Skipping doses also puts you at risk for problems.  Do not smoke.   Monitor your blood pressure at home as directed by your health care provider. SEEK MEDICAL CARE IF:   You think you are having a reaction to medicines taken.  You have recurrent headaches or feel dizzy.  You have swelling in your ankles.  You have trouble with your vision. SEEK IMMEDIATE MEDICAL CARE IF:  You develop a severe headache or confusion.  You have unusual weakness, numbness, or feel faint.  You have severe chest or abdominal pain.  You vomit repeatedly.  You have trouble breathing. MAKE SURE YOU:   Understand these instructions.  Will watch your condition.  Will get help  right away if you are not doing well or get worse.   This information is not intended to replace advice given to you by your health care provider. Make sure you discuss any questions you have with your health care provider.   Document Released: 12/15/2005 Document Revised: 05/01/2015 Document Reviewed: 10/07/2013 Elsevier Interactive Patient Education 2016 Elsevier Inc.  Sciatica Sciatica is pain, weakness, numbness, or tingling along the path of the sciatic nerve. The nerve starts in the lower back and runs down the back of each leg. The nerve controls the muscles in the lower leg and in the back of the knee, while also providing sensation to the back of the thigh, lower leg, and the sole of your foot. Sciatica is a symptom of another medical condition. For instance, nerve damage or certain conditions, such as a herniated disk or bone spur on the spine, pinch or put pressure on the sciatic nerve. This causes the pain, weakness, or  other sensations normally associated with sciatica. Generally, sciatica only affects one side of the body. CAUSES   Herniated or slipped disc.  Degenerative disk disease.  A pain disorder involving the narrow muscle in the buttocks (piriformis syndrome).  Pelvic injury or fracture.  Pregnancy.  Tumor (rare). SYMPTOMS  Symptoms can vary from mild to very severe. The symptoms usually travel from the low back to the buttocks and down the back of the leg. Symptoms can include:  Mild tingling or dull aches in the lower back, leg, or hip.  Numbness in the back of the calf or sole of the foot.  Burning sensations in the lower back, leg, or hip.  Sharp pains in the lower back, leg, or hip.  Leg weakness.  Severe back pain inhibiting movement. These symptoms may get worse with coughing, sneezing, laughing, or prolonged sitting or standing. Also, being overweight may worsen symptoms. DIAGNOSIS  Your caregiver will perform a physical exam to look for common  symptoms of sciatica. He or she may ask you to do certain movements or activities that would trigger sciatic nerve pain. Other tests may be performed to find the cause of the sciatica. These may include:  Blood tests.  X-rays.  Imaging tests, such as an MRI or CT scan. TREATMENT  Treatment is directed at the cause of the sciatic pain. Sometimes, treatment is not necessary and the pain and discomfort goes away on its own. If treatment is needed, your caregiver may suggest:  Over-the-counter medicines to relieve pain.  Prescription medicines, such as anti-inflammatory medicine, muscle relaxants, or narcotics.  Applying heat or ice to the painful area.  Steroid injections to lessen pain, irritation, and inflammation around the nerve.  Reducing activity during periods of pain.  Exercising and stretching to strengthen your abdomen and improve flexibility of your spine. Your caregiver may suggest losing weight if the extra weight makes the back pain worse.  Physical therapy.  Surgery to eliminate what is pressing or pinching the nerve, such as a bone spur or part of a herniated disk. HOME CARE INSTRUCTIONS   Only take over-the-counter or prescription medicines for pain or discomfort as directed by your caregiver.  Apply ice to the affected area for 20 minutes, 3-4 times a day for the first 48-72 hours. Then try heat in the same way.  Exercise, stretch, or perform your usual activities if these do not aggravate your pain.  Attend physical therapy sessions as directed by your caregiver.  Keep all follow-up appointments as directed by your caregiver.  Do not wear high heels or shoes that do not provide proper support.  Check your mattress to see if it is too soft. A firm mattress may lessen your pain and discomfort. SEEK IMMEDIATE MEDICAL CARE IF:   You lose control of your bowel or bladder (incontinence).  You have increasing weakness in the lower back, pelvis, buttocks, or  legs.  You have redness or swelling of your back.  You have a burning sensation when you urinate.  You have pain that gets worse when you lie down or awakens you at night.  Your pain is worse than you have experienced in the past.  Your pain is lasting longer than 4 weeks.  You are suddenly losing weight without reason. MAKE SURE YOU:  Understand these instructions.  Will watch your condition.  Will get help right away if you are not doing well or get worse.   This information is not intended to replace advice given  to you by your health care provider. Make sure you discuss any questions you have with your health care provider.   Document Released: 12/09/2001 Document Revised: 09/05/2015 Document Reviewed: 04/25/2012 Elsevier Interactive Patient Education [employer].      I personally performed the services described in this documentation, which was scribed in my presence. The recorded information has been reviewed and considered, and addended by me as needed.

## 2016-03-14 DIAGNOSIS — M5441 Lumbago with sciatica, right side: Secondary | ICD-10-CM | POA: Diagnosis not present

## 2016-03-14 DIAGNOSIS — M5442 Lumbago with sciatica, left side: Secondary | ICD-10-CM | POA: Diagnosis not present

## 2016-03-21 DIAGNOSIS — G4733 Obstructive sleep apnea (adult) (pediatric): Secondary | ICD-10-CM | POA: Diagnosis not present

## 2016-03-29 ENCOUNTER — Ambulatory Visit (INDEPENDENT_AMBULATORY_CARE_PROVIDER_SITE_OTHER): Payer: Medicare Other | Admitting: Family Medicine

## 2016-03-29 VITALS — BP 128/80 | HR 85 | Temp 98.1°F | Resp 17 | Ht 71.0 in | Wt 312.0 lb

## 2016-03-29 DIAGNOSIS — K6289 Other specified diseases of anus and rectum: Secondary | ICD-10-CM | POA: Diagnosis not present

## 2016-03-29 DIAGNOSIS — K645 Perianal venous thrombosis: Secondary | ICD-10-CM | POA: Diagnosis not present

## 2016-03-29 MED ORDER — CIPROFLOXACIN HCL 500 MG PO TABS: 500.0000 mg | ORAL_TABLET | Freq: Two times a day (BID) | ORAL | Status: DC

## 2016-03-29 MED ORDER — HYDROCORTISONE ACE-PRAMOXINE 2.5-1 % RE CREA: 1.0000 "application " | TOPICAL_CREAM | Freq: Three times a day (TID) | RECTAL | Status: DC

## 2016-03-29 MED ORDER — HYDROCODONE-ACETAMINOPHEN 5-325 MG PO TABS: 1.0000 | ORAL_TABLET | Freq: Four times a day (QID) | ORAL | Status: DC | PRN

## 2016-03-29 NOTE — Progress Notes (Signed)
Patient ID: Michael [name], male    DOB: 1971/10/22  Age: 45 y.o. MRN: [id number]  Chief Complaint  Patient presents with  . Allergic Reaction    Subjective:  For the past week the patient has had a sore place swollen right at his bottom. Has never had that before. No allergies.  Current allergies, medications, problem list, past/family and social histories reviewed.  Objective:  BP 128/80 mmHg  Pulse 85  Temp(Src) 98.1 F (36.7 C) (Oral)  Resp 17  Ht 5\' 11"  (1.803 m)  Wt 312 lb (141.522 kg)  BMI 43.53 kg/m2  SpO2 97%  Left external hemorrhoid. He is a very large man, large cheeks, and the hemorrhoid actually is a little deeper in the soft tissues the usual. Gave him options of treatment. He wanted to go ahead and trying get taken care of now.  Procedure note Prepped with Betadine and alcohol. Anesthetized with 2% lidocaine with epinephrine. Incision made and moderate clot expressed. There may be a small amount of residual but I could not get it to express and did not want to go deeper in the anal ring. Small amount of bleeding.  Assessment & Plan:   Assessment: 1. External thrombosed hemorrhoids   2. Anal pain       Plan: Postop care discussed. See instructions.  No orders of the defined types were placed in this encounter.    Meds ordered this encounter  Medications  . hydrocortisone-pramoxine (ANALPRAM HC) 2.5-1 % rectal cream    Sig: Place 1 application rectally 3 (three) times daily.    Dispense:  30 g    Refill:  0  . HYDROcodone-acetaminophen (NORCO) 5-325 MG tablet    Sig: Take 1 tablet by mouth every 6 (six) hours as needed.    Dispense:  10 tablet    Refill:  0  . ciprofloxacin (CIPRO) 500 MG tablet    Sig: Take 1 tablet (500 mg total) by mouth 2 (two) times daily.    Dispense:  10 tablet    Refill:  0         Patient Instructions   Use the sitz bath twice a day for about 3 days as directed  When you wipe yourself it probably is good to try and  clean with a Wet-One or similar diaper wipe.  Beginning tomorrow use some of the prescription hemorrhoid cream 3 or 4 times daily as directed  Take the Ciprofloxin 500 mg twice daily for 5 days  Take the hydrocodone pain pills one every 6 hours only if needed for bad pain  Return if worse  Hemorrhoids Hemorrhoids are swollen veins around the rectum or anus. There are two types of hemorrhoids:   Internal hemorrhoids. These occur in the veins just inside the rectum. They may poke through to the outside and become irritated and painful.  External hemorrhoids. These occur in the veins outside the anus and can be felt as a painful swelling or hard lump near the anus. CAUSES  Pregnancy.   Obesity.   Constipation or diarrhea.   Straining to have a bowel movement.   Sitting for long periods on the toilet.  Heavy lifting or other activity that caused you to strain.  Anal intercourse. SYMPTOMS   Pain.   Anal itching or irritation.   Rectal bleeding.   Fecal leakage.   Anal swelling.   One or more lumps around the anus.  DIAGNOSIS  Your caregiver may be able to diagnose hemorrhoids by visual examination.  Other examinations or tests that may be performed include:   Examination of the rectal area with a gloved hand (digital rectal exam).   Examination of anal canal using a small tube (scope).   A blood test if you have lost a significant amount of blood.  A test to look inside the colon (sigmoidoscopy or colonoscopy). TREATMENT Most hemorrhoids can be treated at home. However, if symptoms do not seem to be getting better or if you have a lot of rectal bleeding, your caregiver may perform a procedure to help make the hemorrhoids get smaller or remove them completely. Possible treatments include:   Placing a rubber band at the base of the hemorrhoid to cut off the circulation (rubber band ligation).   Injecting a chemical to shrink the hemorrhoid  (sclerotherapy).   Using a tool to burn the hemorrhoid (infrared light therapy).   Surgically removing the hemorrhoid (hemorrhoidectomy).   Stapling the hemorrhoid to block blood flow to the tissue (hemorrhoid stapling).  HOME CARE INSTRUCTIONS   Eat foods with fiber, such as whole grains, beans, nuts, fruits, and vegetables. Ask your doctor about taking products with added fiber in them (fibersupplements).  Increase fluid intake. Drink enough water and fluids to keep your urine clear or pale yellow.   Exercise regularly.   Go to the bathroom when you have the urge to have a bowel movement. Do not wait.   Avoid straining to have bowel movements.   Keep the anal area dry and clean. Use wet toilet paper or moist towelettes after a bowel movement.   Medicated creams and suppositories may be used or applied as directed.   Only take over-the-counter or prescription medicines as directed by your caregiver.   Take warm sitz baths for 15-20 minutes, 3-4 times a day to ease pain and discomfort.   Place ice packs on the hemorrhoids if they are tender and swollen. Using ice packs between sitz baths may be helpful.   Put ice in a plastic bag.   Place a towel between your skin and the bag.   Leave the ice on for 15-20 minutes, 3-4 times a day.   Do not use a donut-shaped pillow or sit on the toilet for long periods. This increases blood pooling and pain.  SEEK MEDICAL CARE IF:  You have increasing pain and swelling that is not controlled by treatment or medicine.  You have uncontrolled bleeding.  You have difficulty or you are unable to have a bowel movement.  You have pain or inflammation outside the area of the hemorrhoids. MAKE SURE YOU:  Understand these instructions.  Will watch your condition.  Will get help right away if you are not doing well or get worse.   This information is not intended to replace advice given to you by your health care provider.  Make sure you discuss any questions you have with your health care provider.   Document Released: 12/12/2000 Document Revised: 12/01/2012 Document Reviewed: 10/19/2012 Elsevier Interactive Patient Education 2016 [employer].   IF you received an x-ray today, you will receive an invoice from [facility]. Please contact [facility] at [phone] with questions or concerns regarding your invoice.   IF you received labwork today, you will receive an invoice from [employer]. Please contact [facility] at [phone] with questions or concerns regarding your invoice.   Our billing staff will not be able to assist you with questions regarding bills from these companies.  You will be contacted with  the lab results as soon as they are available. The fastest way to get your results is to activate your My Chart account. Instructions are located on the last page of this paperwork. If you have not heard from [place] regarding the results in 2 weeks, please contact this office.         Return if symptoms worsen or fail to improve.   HOPPER,DAVID, MD 03/29/2016

## 2016-03-29 NOTE — Patient Instructions (Addendum)
Use the sitz bath twice a day for about 3 days as directed  When you wipe yourself it probably is good to try and clean with a Wet-One or similar diaper wipe.  Beginning tomorrow use some of the prescription hemorrhoid cream 3 or 4 times daily as directed  Take the Ciprofloxin 500 mg twice daily for 5 days  Take the hydrocodone pain pills one every 6 hours only if needed for bad pain  Return if worse  Hemorrhoids Hemorrhoids are swollen veins around the rectum or anus. There are two types of hemorrhoids:   Internal hemorrhoids. These occur in the veins just inside the rectum. They may poke through to the outside and become irritated and painful.  External hemorrhoids. These occur in the veins outside the anus and can be felt as a painful swelling or hard lump near the anus. CAUSES  Pregnancy.   Obesity.   Constipation or diarrhea.   Straining to have a bowel movement.   Sitting for long periods on the toilet.  Heavy lifting or other activity that caused you to strain.  Anal intercourse. SYMPTOMS   Pain.   Anal itching or irritation.   Rectal bleeding.   Fecal leakage.   Anal swelling.   One or more lumps around the anus.  DIAGNOSIS  Your caregiver may be able to diagnose hemorrhoids by visual examination. Other examinations or tests that may be performed include:   Examination of the rectal area with a gloved hand (digital rectal exam).   Examination of anal canal using a small tube (scope).   A blood test if you have lost a significant amount of blood.  A test to look inside the colon (sigmoidoscopy or colonoscopy). TREATMENT Most hemorrhoids can be treated at home. However, if symptoms do not seem to be getting better or if you have a lot of rectal bleeding, your caregiver may perform a procedure to help make the hemorrhoids get smaller or remove them completely. Possible treatments include:   Placing a rubber band at the base of the hemorrhoid  to cut off the circulation (rubber band ligation).   Injecting a chemical to shrink the hemorrhoid (sclerotherapy).   Using a tool to burn the hemorrhoid (infrared light therapy).   Surgically removing the hemorrhoid (hemorrhoidectomy).   Stapling the hemorrhoid to block blood flow to the tissue (hemorrhoid stapling).  HOME CARE INSTRUCTIONS   Eat foods with fiber, such as whole grains, beans, nuts, fruits, and vegetables. Ask your doctor about taking products with added fiber in them (fibersupplements).  Increase fluid intake. Drink enough water and fluids to keep your urine clear or pale yellow.   Exercise regularly.   Go to the bathroom when you have the urge to have a bowel movement. Do not wait.   Avoid straining to have bowel movements.   Keep the anal area dry and clean. Use wet toilet paper or moist towelettes after a bowel movement.   Medicated creams and suppositories may be used or applied as directed.   Only take over-the-counter or prescription medicines as directed by your caregiver.   Take warm sitz baths for 15-20 minutes, 3-4 times a day to ease pain and discomfort.   Place ice packs on the hemorrhoids if they are tender and swollen. Using ice packs between sitz baths may be helpful.   Put ice in a plastic bag.   Place a towel between your skin and the bag.   Leave the ice on for 15-20 minutes, 3-4  times a day.   Do not use a donut-shaped pillow or sit on the toilet for long periods. This increases blood pooling and pain.  SEEK MEDICAL CARE IF:  You have increasing pain and swelling that is not controlled by treatment or medicine.  You have uncontrolled bleeding.  You have difficulty or you are unable to have a bowel movement.  You have pain or inflammation outside the area of the hemorrhoids. MAKE SURE YOU:  Understand these instructions.  Will watch your condition.  Will get help right away if you are not doing well or get  worse.   This information is not intended to replace advice given to you by your health care provider. Make sure you discuss any questions you have with your health care provider.   Document Released: 12/12/2000 Document Revised: 12/01/2012 Document Reviewed: 10/19/2012 Elsevier Interactive Patient Education 2016 [employer].   IF you received an x-ray today, you will receive an invoice from [facility]. Please contact [facility] at [phone] with questions or concerns regarding your invoice.   IF you received labwork today, you will receive an invoice from [employer]. Please contact [facility] at [phone] with questions or concerns regarding your invoice.   Our billing staff will not be able to assist you with questions regarding bills from these companies.  You will be contacted with the lab results as soon as they are available. The fastest way to get your results is to activate your My Chart account. Instructions are located on the last page of this paperwork. If you have not heard from [place] regarding the results in 2 weeks, please contact this office.

## 2016-04-08 ENCOUNTER — Encounter (HOSPITAL_BASED_OUTPATIENT_CLINIC_OR_DEPARTMENT_OTHER): Payer: Medicare Other

## 2016-04-21 DIAGNOSIS — G4733 Obstructive sleep apnea (adult) (pediatric): Secondary | ICD-10-CM | POA: Diagnosis not present

## 2016-05-21 DIAGNOSIS — G4733 Obstructive sleep apnea (adult) (pediatric): Secondary | ICD-10-CM | POA: Diagnosis not present

## 2016-06-03 ENCOUNTER — Ambulatory Visit (INDEPENDENT_AMBULATORY_CARE_PROVIDER_SITE_OTHER): Payer: Medicare Other | Admitting: Physician Assistant

## 2016-06-03 ENCOUNTER — Telehealth: Payer: Self-pay

## 2016-06-03 VITALS — BP 122/72 | HR 78 | Temp 98.4°F | Resp 17 | Ht 69.5 in | Wt 313.0 lb

## 2016-06-03 DIAGNOSIS — Z8719 Personal history of other diseases of the digestive system: Secondary | ICD-10-CM

## 2016-06-03 DIAGNOSIS — I10 Essential (primary) hypertension: Secondary | ICD-10-CM

## 2016-06-03 DIAGNOSIS — Z87898 Personal history of other specified conditions: Secondary | ICD-10-CM | POA: Diagnosis not present

## 2016-06-03 LAB — CBC
HEMATOCRIT: 40.9 % (ref 38.5–50.0)
Hemoglobin: 13.3 g/dL (ref 13.2–17.1)
MCH: 27.5 pg (ref 27.0–33.0)
MCHC: 32.5 g/dL (ref 32.0–36.0)
MCV: 84.7 fL (ref 80.0–100.0)
MPV: 10.6 fL (ref 7.5–12.5)
PLATELETS: 223 10*3/uL (ref 140–400)
RBC: 4.83 MIL/uL (ref 4.20–5.80)
RDW: 15.7 % — AB (ref 11.0–15.0)
WBC: 5.8 10*3/uL (ref 3.8–10.8)

## 2016-06-03 LAB — BASIC METABOLIC PANEL
BUN: 15 mg/dL (ref 7–25)
CALCIUM: 9 mg/dL (ref 8.6–10.3)
CO2: 27 mmol/L (ref 20–31)
CREATININE: 1.07 mg/dL (ref 0.60–1.35)
Chloride: 102 mmol/L (ref 98–110)
GLUCOSE: 101 mg/dL — AB (ref 65–99)
Potassium: 3.4 mmol/L — ABNORMAL LOW (ref 3.5–5.3)
SODIUM: 140 mmol/L (ref 135–146)

## 2016-06-03 NOTE — Patient Instructions (Addendum)
Take 25 mg of diphenhydramine 30-60 mins before bed every night.    IF you received an x-ray today, you will receive an invoice from [facility] Radiology. Please contact [facility] at [phone] with questions or concerns regarding your invoice.   IF you received labwork today, you will receive an invoice from [employer]. Please contact [facility] at [phone] with questions or concerns regarding your invoice.   Our billing staff will not be able to assist you with questions regarding bills from these companies.  You will be contacted with the lab results as soon as they are available. The fastest way to get your results is to activate your My Chart account. Instructions are located on the last page of this paperwork. If you have not heard from [place] regarding the results in 2 weeks, please contact this office.

## 2016-06-03 NOTE — Telephone Encounter (Signed)
Pt was returning a call that he received. It was not documented.  Please advise  [phone]

## 2016-06-03 NOTE — Progress Notes (Signed)
06/03/2016 9:18 AM   DOB: [birth year] / MRN: [id number]  SUBJECTIVE:  Michael [name] is a 45 y.o. male presenting for a refill of BP medications.  His BP is doing very well relative to the past but he is not sure what he is taking when.  He does not complain of chest pain, cough, HA, nausea, and leg swelling.  He feels well.    He has a history of hemorrhoids and reports he continues to have pain when he defecates occasionally.  He would like a refill of Vicodin for this. He has rectal suppositories at home.  He does not like using fiber.   He has No Known Allergies.   He  has a past medical history of Hypertension; Migraine; Depression; Schizophrenia ([place]); Noncompliance; Anxiety; Insomnia; echocardiogram; and OSA (obstructive sleep apnea).    He  reports that he has never smoked. He has never used smokeless tobacco. He reports that he does not drink alcohol or use illicit drugs. He  reports that he currently engages in sexual activity. The patient  has past surgical history that includes Mandible surgery (1998) and Admission (04/2012).  His family history includes Cancer in his father, maternal grandfather, and paternal grandfather; Diabetes in his mother; Heart disease (age of onset: 33) in his father; Hypertension in his brother, father, and mother.  Review of Systems  Constitutional: Negative for fever and diaphoresis.  Eyes: Negative for blurred vision.  Cardiovascular: Negative for chest pain and leg swelling.  Neurological: Negative for dizziness, weakness and headaches.    Problem list and medications reviewed and updated by myself where necessary, and exist elsewhere in the encounter.   OBJECTIVE:  BP 122/72 mmHg  Pulse 78  Temp(Src) 98.4 F (36.9 C) (Oral)  Resp 17  Ht 5' 9.5" (1.765 m)  Wt 313 lb (141.976 kg)  BMI 45.57 kg/m2  SpO2 99%  Physical Exam  Constitutional: He is oriented to person, place, and time. He appears well-developed. He does not appear ill.  Eyes:  Conjunctivae and EOM are normal. Pupils are equal, round, and reactive to light.  Cardiovascular: Normal rate and regular rhythm.   Pulmonary/Chest: Effort normal and breath sounds normal.  Abdominal: He exhibits no distension.  Genitourinary:     Musculoskeletal: Normal range of motion.  Neurological: He is alert and oriented to person, place, and time. No cranial nerve deficit. Coordination normal.  Skin: Skin is warm and dry. He is not diaphoretic.  Psychiatric: He has a normal mood and affect.  Nursing note and vitals reviewed.   CBC Latest Ref Rng 10/03/2015 06/13/2015 09/24/2013  WBC 4.0 - 10.5 K/uL 6.2 5.1 -  Hemoglobin 13.0 - 17.0 g/dL 13.1 13.7(A) 15.3  Hematocrit 39.0 - 52.0 % 39.6 42.7(A) 45.0  Platelets 150 - 400 K/uL 230 - -   Wt Readings from Last 3 Encounters:  06/03/16 313 lb (141.976 kg)  03/29/16 312 lb (141.522 kg)  03/04/16 307 lb 9.6 oz (139.526 kg)   BMP Latest Ref Rng 10/03/2015 08/27/2015 06/19/2015  Glucose 65 - 99 mg/dL 99 96 98  BUN 7 - 25 mg/dL 16 15 13   Creatinine 0.60 - 1.35 mg/dL 1.01 0.87 0.97  Sodium 135 - 146 mmol/L 139 140 139  Potassium 3.5 - 5.3 mmol/L 3.5 3.9 3.2(L)  Chloride 98 - 110 mmol/L 102 105 102  CO2 20 - 31 mmol/L 27 26 27   Calcium 8.6 - 10.3 mg/dL 8.8 9.0 9.0        No results found  for this or any previous visit (from the past 37 hour(s)).  No results found.  ASSESSMENT AND PLAN  Johnathyn was seen today for medication refill and depression.  Diagnoses and all orders for this visit:  Essential hypertension: Well controlled.  Continue current plan.   -     Basic metabolic panel -     CBC  History of hemorrhoids Comments: He is requesting a refill of pain medication. PE shows no hemorrhoid. I have advised against refill and refused.     The patient was advised to call or return to clinic if he does not see an improvement in symptoms or to seek the care of the closest emergency department if he worsens with the above  plan.   Philis Fendt, MHS, PA-C [facility] 06/03/2016 9:18 AM

## 2016-06-03 NOTE — Telephone Encounter (Signed)
Called pt and left VM informing him to disregard original call

## 2016-06-04 ENCOUNTER — Encounter: Payer: Self-pay | Admitting: *Deleted

## 2016-06-05 ENCOUNTER — Encounter (HOSPITAL_BASED_OUTPATIENT_CLINIC_OR_DEPARTMENT_OTHER): Payer: Medicare Other

## 2016-06-20 DIAGNOSIS — G4733 Obstructive sleep apnea (adult) (pediatric): Secondary | ICD-10-CM | POA: Diagnosis not present

## 2016-06-27 ENCOUNTER — Encounter (HOSPITAL_BASED_OUTPATIENT_CLINIC_OR_DEPARTMENT_OTHER): Payer: Medicare Other

## 2016-07-21 DIAGNOSIS — G4733 Obstructive sleep apnea (adult) (pediatric): Secondary | ICD-10-CM | POA: Diagnosis not present

## 2016-07-29 ENCOUNTER — Encounter (HOSPITAL_BASED_OUTPATIENT_CLINIC_OR_DEPARTMENT_OTHER): Payer: Medicare Other

## 2016-08-20 DIAGNOSIS — G4733 Obstructive sleep apnea (adult) (pediatric): Secondary | ICD-10-CM | POA: Diagnosis not present

## 2016-09-19 DIAGNOSIS — G4733 Obstructive sleep apnea (adult) (pediatric): Secondary | ICD-10-CM | POA: Diagnosis not present

## 2016-09-20 ENCOUNTER — Other Ambulatory Visit: Payer: Self-pay | Admitting: Physician Assistant

## 2016-09-20 DIAGNOSIS — E876 Hypokalemia: Secondary | ICD-10-CM

## 2016-10-28 ENCOUNTER — Other Ambulatory Visit: Payer: Self-pay | Admitting: Physician Assistant

## 2016-10-28 ENCOUNTER — Other Ambulatory Visit: Payer: Self-pay

## 2016-10-28 MED ORDER — LISINOPRIL 40 MG PO TABS
40.0000 mg | ORAL_TABLET | Freq: Every day | ORAL | 1 refills | Status: DC
Start: 2016-10-28 — End: 2016-10-28

## 2016-10-28 NOTE — Telephone Encounter (Signed)
Called to pharmacy 

## 2016-10-28 NOTE — Telephone Encounter (Signed)
Last seen 06/17/16

## 2016-12-02 ENCOUNTER — Other Ambulatory Visit: Payer: Self-pay | Admitting: Physician Assistant

## 2016-12-02 ENCOUNTER — Other Ambulatory Visit: Payer: Self-pay

## 2016-12-02 MED ORDER — HYDROCHLOROTHIAZIDE 25 MG PO TABS
25.0000 mg | ORAL_TABLET | Freq: Every day | ORAL | 0 refills | Status: DC
Start: 2016-12-02 — End: 2016-12-18

## 2016-12-14 ENCOUNTER — Emergency Department (HOSPITAL_COMMUNITY): Payer: Medicare Other

## 2016-12-14 ENCOUNTER — Encounter (HOSPITAL_COMMUNITY): Payer: Self-pay | Admitting: Emergency Medicine

## 2016-12-14 ENCOUNTER — Emergency Department (HOSPITAL_COMMUNITY)
Admission: EM | Admit: 2016-12-14 | Discharge: 2016-12-14 | Disposition: A | Discharge status: Home / Self Care | Payer: Medicare Other | Attending: Emergency Medicine | Admitting: Emergency Medicine

## 2016-12-14 DIAGNOSIS — J45909 Unspecified asthma, uncomplicated: Secondary | ICD-10-CM | POA: Insufficient documentation

## 2016-12-14 DIAGNOSIS — R51 Headache: Secondary | ICD-10-CM | POA: Diagnosis not present

## 2016-12-14 DIAGNOSIS — I10 Essential (primary) hypertension: Secondary | ICD-10-CM | POA: Diagnosis not present

## 2016-12-14 DIAGNOSIS — R0789 Other chest pain: Secondary | ICD-10-CM | POA: Diagnosis not present

## 2016-12-14 DIAGNOSIS — R0689 Other abnormalities of breathing: Secondary | ICD-10-CM | POA: Diagnosis not present

## 2016-12-14 DIAGNOSIS — Z79899 Other long term (current) drug therapy: Secondary | ICD-10-CM | POA: Diagnosis not present

## 2016-12-14 DIAGNOSIS — R519 Headache, unspecified: Secondary | ICD-10-CM

## 2016-12-14 DIAGNOSIS — R072 Precordial pain: Secondary | ICD-10-CM | POA: Diagnosis present

## 2016-12-14 LAB — CBC
HEMATOCRIT: 40.1 % (ref 39.0–52.0)
HEMOGLOBIN: 13.1 g/dL (ref 13.0–17.0)
MCH: 27.5 pg (ref 26.0–34.0)
MCHC: 32.7 g/dL (ref 30.0–36.0)
MCV: 84.1 fL (ref 78.0–100.0)
Platelets: 195 10*3/uL (ref 150–400)
RBC: 4.77 MIL/uL (ref 4.22–5.81)
RDW: 15.6 % — ABNORMAL HIGH (ref 11.5–15.5)
WBC: 6.4 10*3/uL (ref 4.0–10.5)

## 2016-12-14 LAB — BASIC METABOLIC PANEL
ANION GAP: 8 (ref 5–15)
BUN: 12 mg/dL (ref 6–20)
CO2: 26 mmol/L (ref 22–32)
Calcium: 9 mg/dL (ref 8.9–10.3)
Chloride: 106 mmol/L (ref 101–111)
Creatinine, Ser: 0.96 mg/dL (ref 0.61–1.24)
GFR calc Af Amer: >60 mL/min (ref >60)
GFR calc non Af Amer: >60 mL/min (ref >60)
GLUCOSE: 113 mg/dL — AB (ref 65–99)
POTASSIUM: 3.3 mmol/L — AB (ref 3.5–5.1)
Sodium: 140 mmol/L (ref 135–145)

## 2016-12-14 LAB — I-STAT TROPONIN, ED
Troponin i, poc: 0 ng/mL (ref 0.00–0.08)
Troponin i, poc: 0 ng/mL (ref 0.00–0.08)

## 2016-12-14 LAB — HEPATIC FUNCTION PANEL
ALK PHOS: 53 U/L (ref 38–126)
ALT: 16 U/L — AB (ref 17–63)
AST: 21 U/L (ref 15–41)
Albumin: 3.4 g/dL — ABNORMAL LOW (ref 3.5–5.0)
BILIRUBIN TOTAL: 0.8 mg/dL (ref 0.3–1.2)
Bilirubin, Direct: <0.1 mg/dL — ABNORMAL LOW (ref 0.1–0.5)
Total Protein: 7.3 g/dL (ref 6.5–8.1)

## 2016-12-14 LAB — PROTIME-INR
INR: 1.16
Prothrombin Time: 14.9 seconds (ref 11.4–15.2)

## 2016-12-14 LAB — D-DIMER, QUANTITATIVE: D-Dimer, Quant: 0.4 ug/mL-FEU (ref 0.00–0.50)

## 2016-12-14 LAB — BRAIN NATRIURETIC PEPTIDE: B NATRIURETIC PEPTIDE 5: 15.4 pg/mL (ref 0.0–100.0)

## 2016-12-14 IMAGING — CT CT HEAD W/O CM
4 series · 16 of 47 positions shown, 18 images · non-contrast
Comparison: [DATE].

CLINICAL DATA: Headache, nausea and vomiting

EXAM:
CT HEAD WITHOUT CONTRAST
TECHNIQUE: Contiguous axial images were obtained from the base of the skull
through the vertex without intravenous contrast.

[Series 2: head without · axial · non-contrast · 0.47mm/px · z∈[-78,+47]mm · 7 of 35 slices shown, 9 images]
[im 5/35  brain]
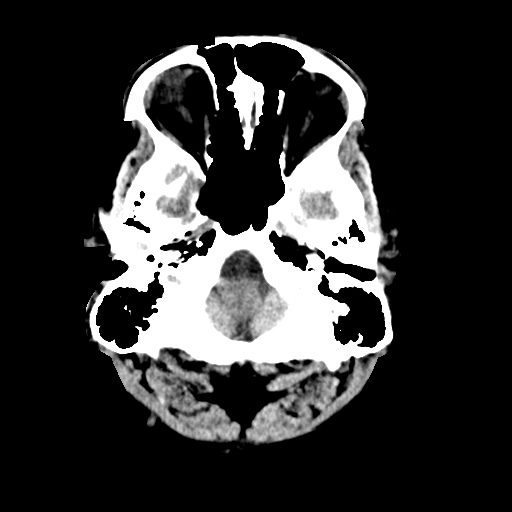
[im 5/35  bone]
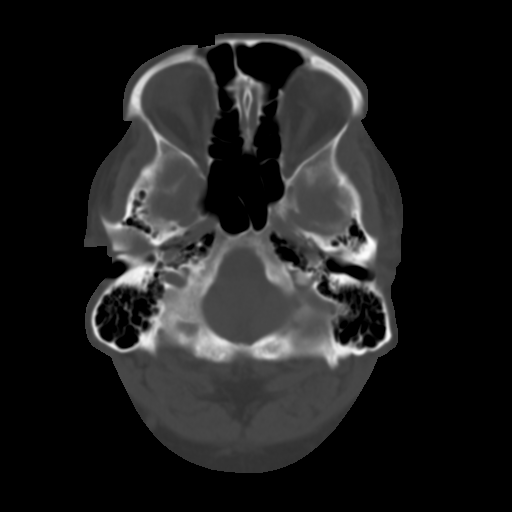
[im 9/35  brain]
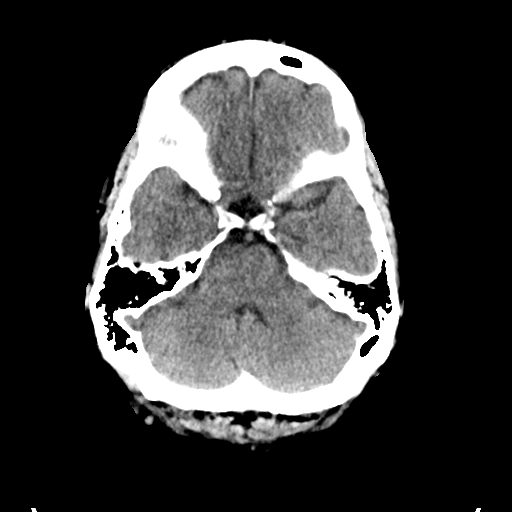
[im 13/35  brain]
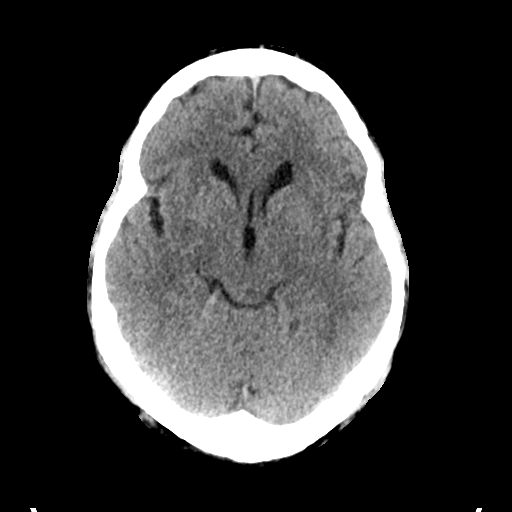
[im 18/35  brain]
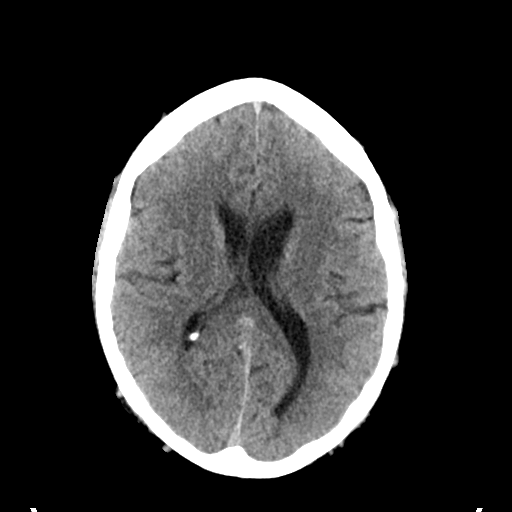
[im 22/35  brain]
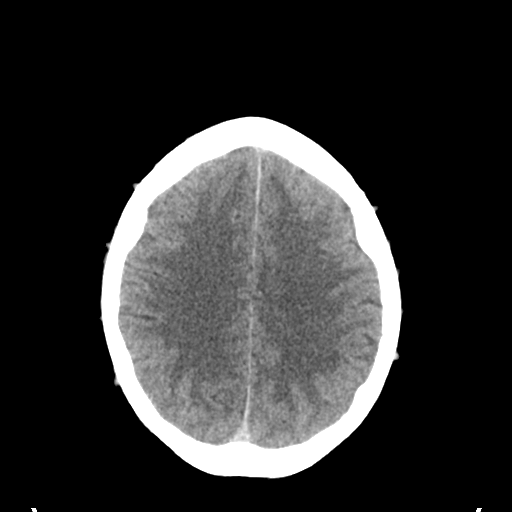
[im 22/35  bone]
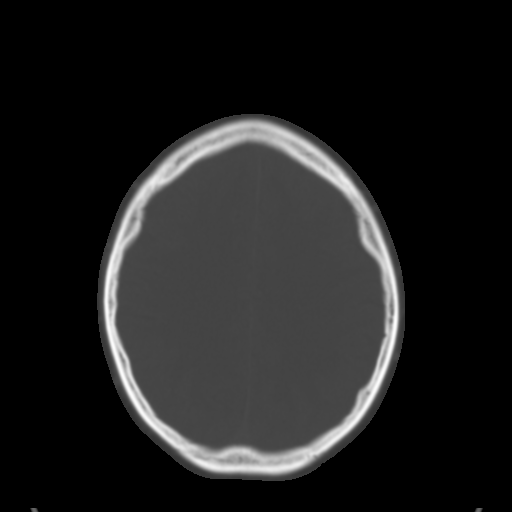
[im 26/35  brain]
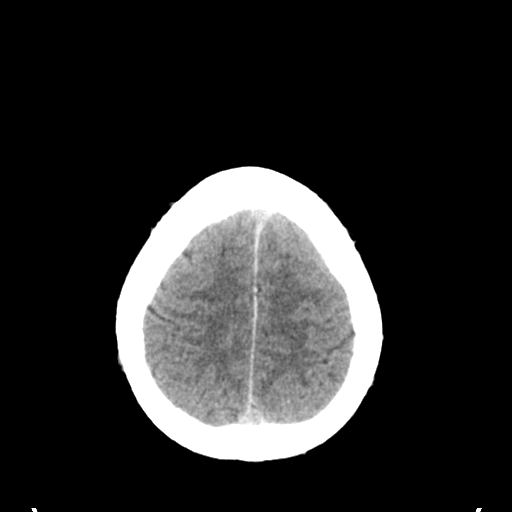
[im 30/35  brain]
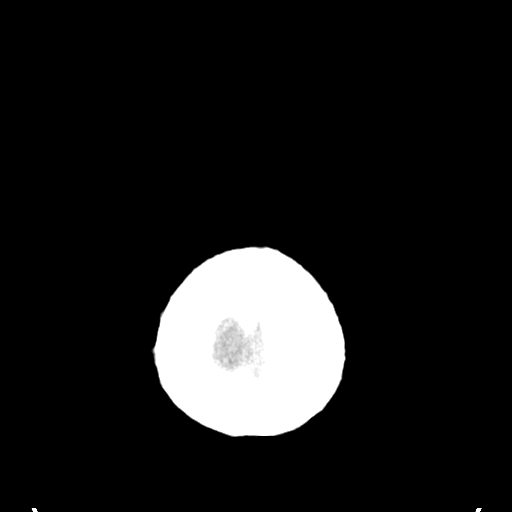

[Series 3: head bone · axial · 0.47mm/px · z∈[-82,-46]mm · 3 of 88 slices shown]
[im 9/88  bone]
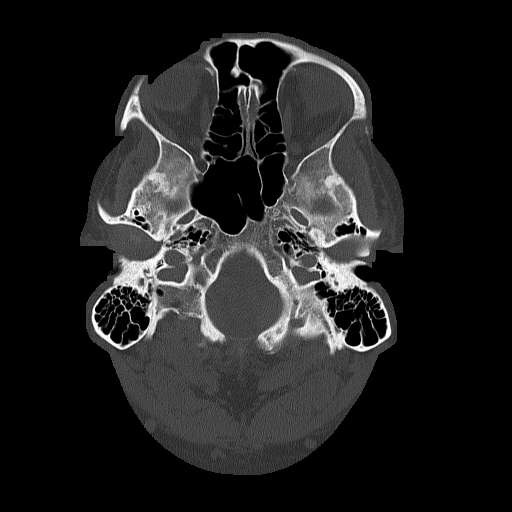
[im 18/88  bone]
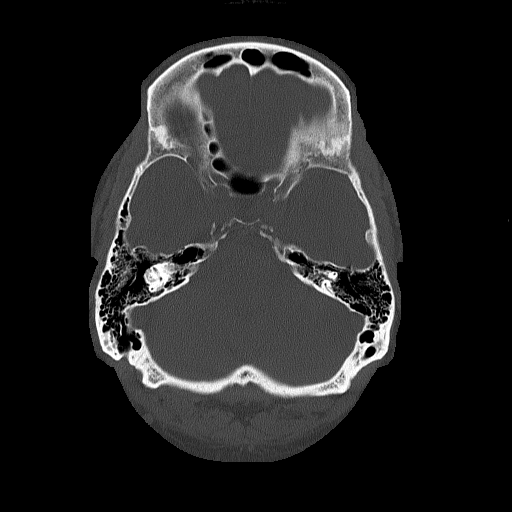
[im 27/88  bone]
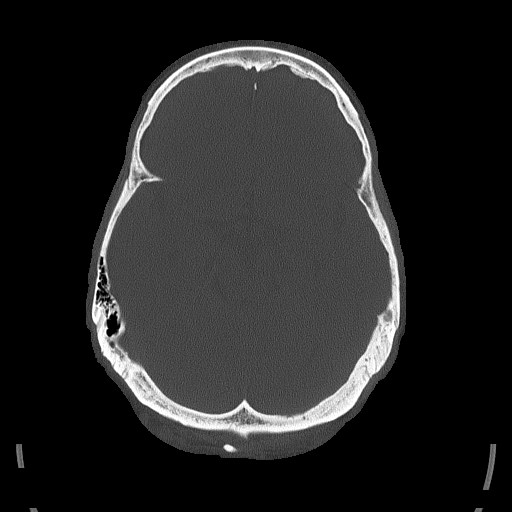

[Series 4: head without cor · coronal · non-contrast · 0.33mm/px · 3 of 71 slices shown]
[im 24/71  brain]
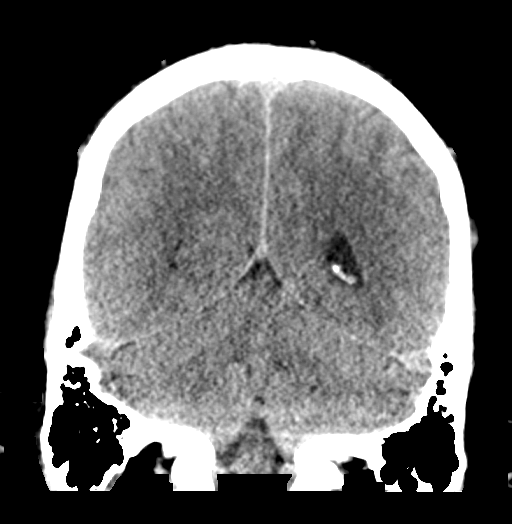
[im 32/71  brain]
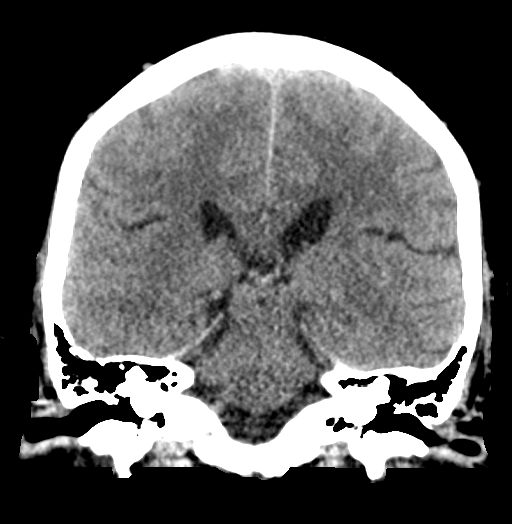
[im 39/71  brain]
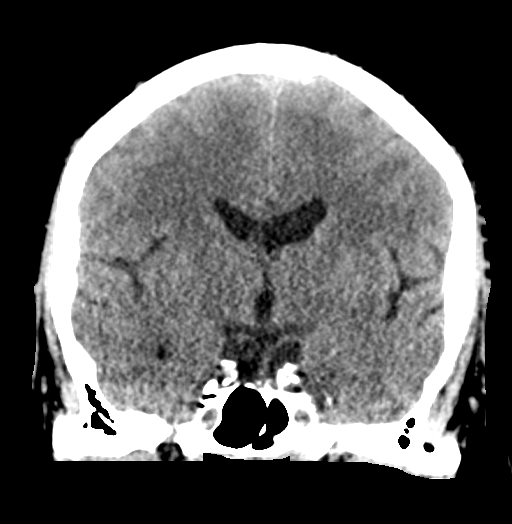

[Series 5: head without sag · sagittal · non-contrast · 0.34mm/px · 3 of 59 slices shown]
[im 20/59  brain]
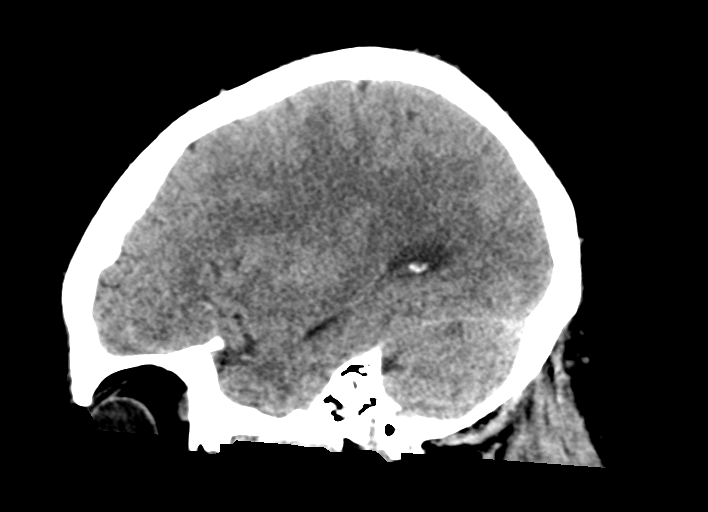
[im 30/59  brain]
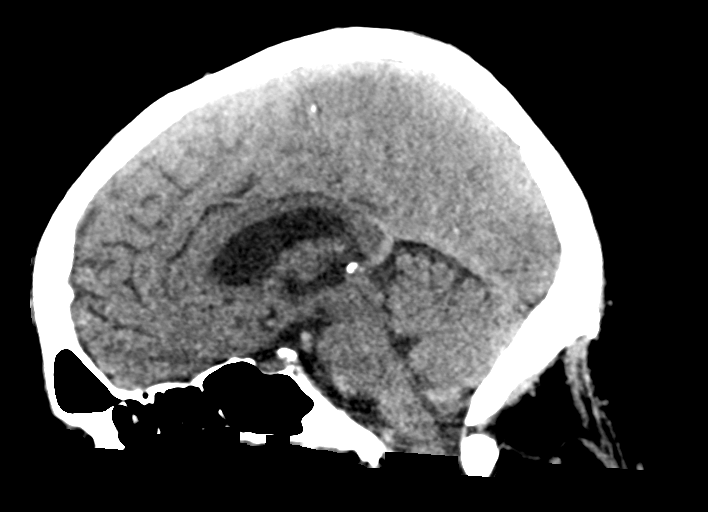
[im 39/59  brain]
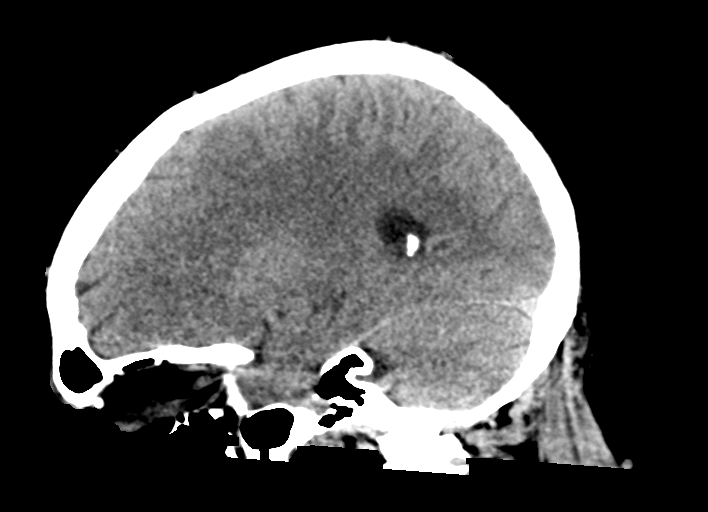

[16 of 47 positions shown; findings below may reference images not displayed]

FINDINGS: Brain: No evidence of acute infarction, hemorrhage, hydrocephalus,
extra-axial collection or mass lesion/mass effect.

Vascular: No hyperdense vessel or unexpected calcification.

Skull: Normal. Negative for fracture or focal lesion.

Sinuses/Orbits: A left maxillary sinus retention cyst is again
demonstrated. Opacification of a single ethmoid air cell on each
side, not previously present. Unremarkable orbits.

Other: None.
IMPRESSION: 1. No acute abnormality.
2. Interval mild chronic bilateral ethmoid sinusitis.

## 2016-12-14 IMAGING — DX DG CHEST 2V
2 series · 2 of 2 positions shown · non-contrast
Comparison: [DATE]

CLINICAL DATA: Sharp chest pain.

EXAM:
CHEST  2 VIEW

[chest lat]
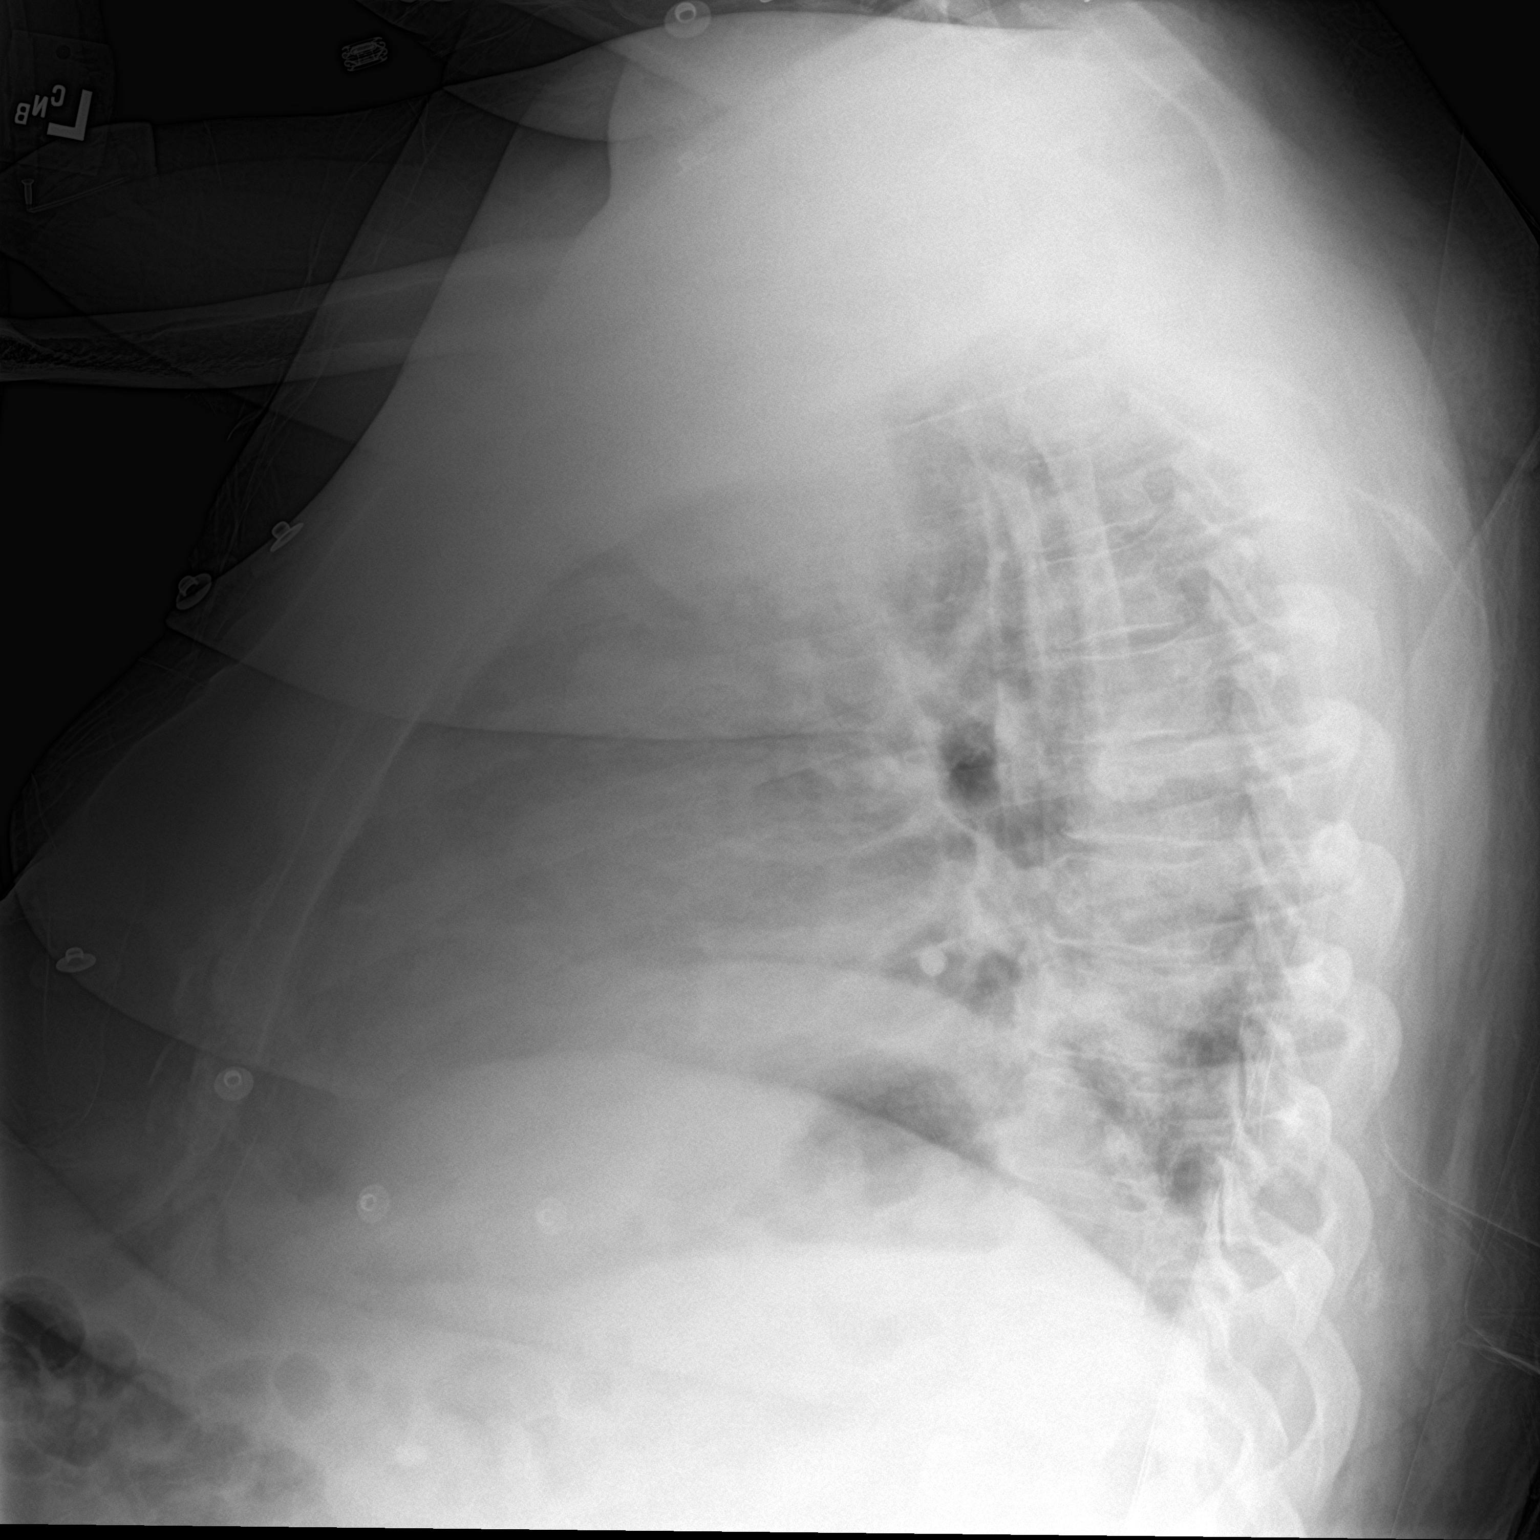

[chest ap]
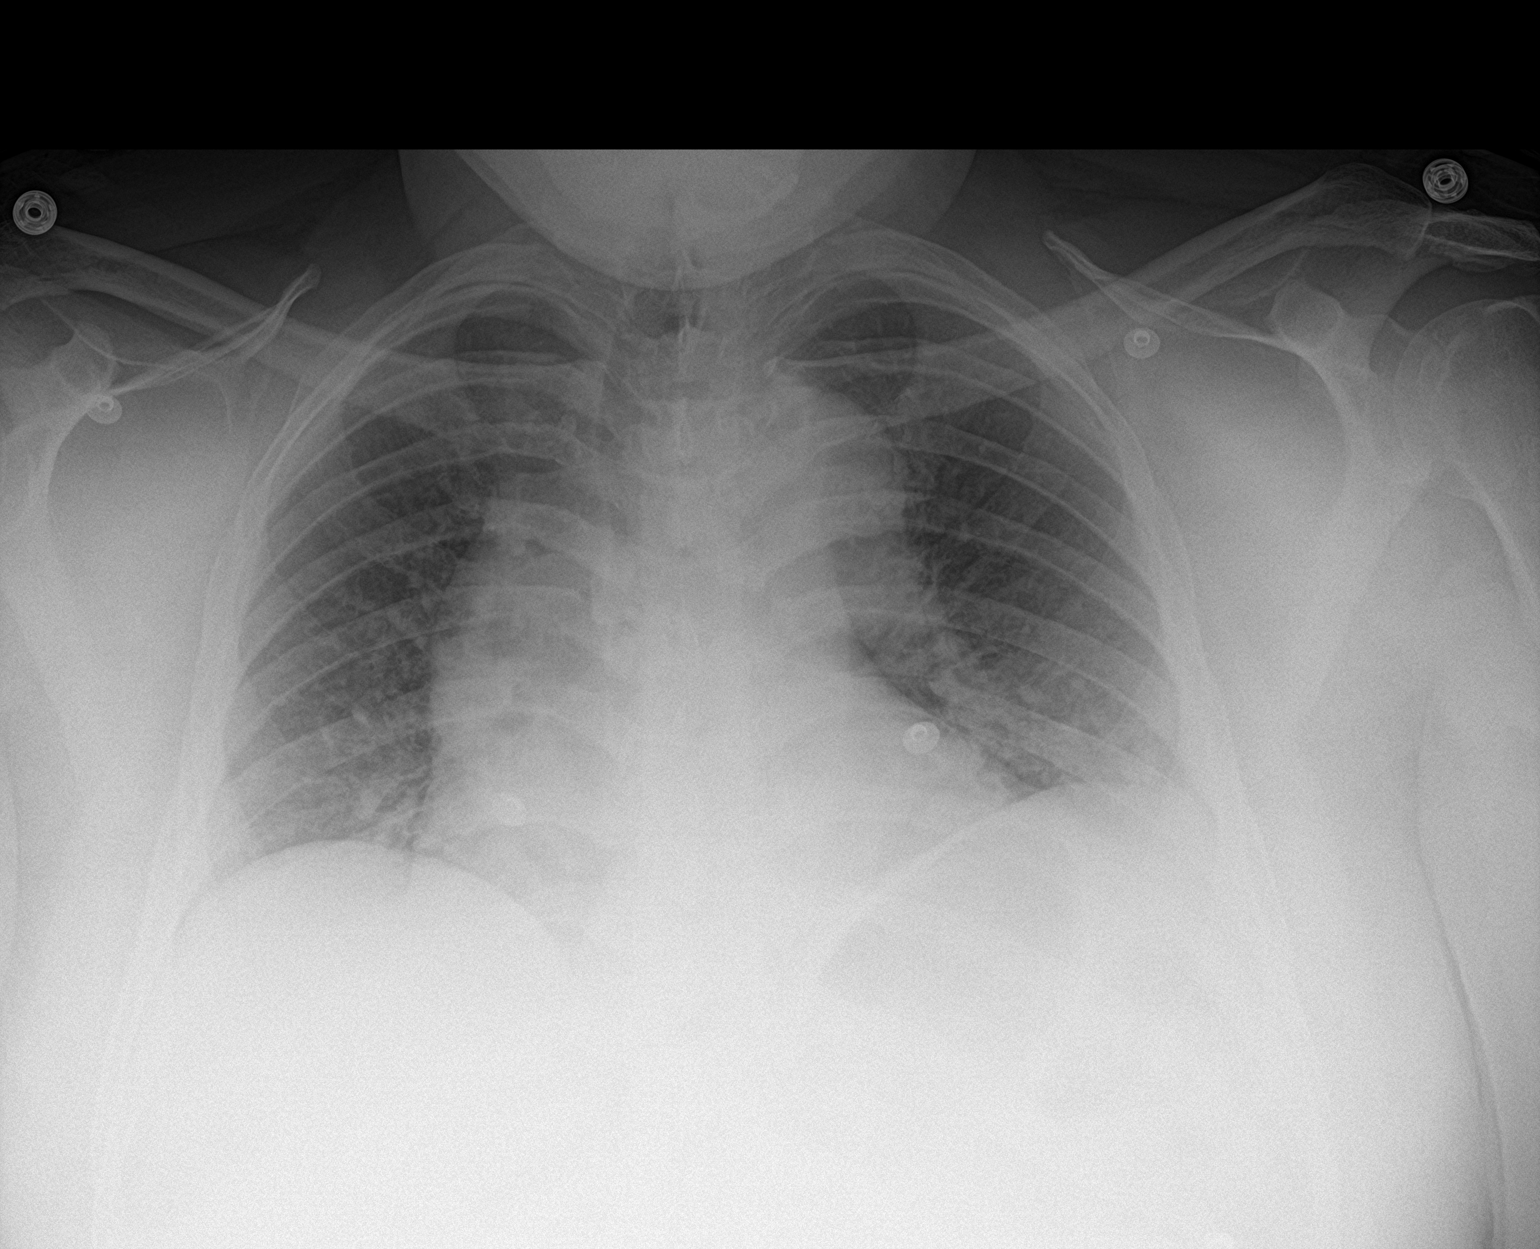

[2 of 2 positions shown; findings below may reference images not displayed]

FINDINGS: Low lung volumes bibasilar atelectasis. Accentuation of heart size
body low volumes. No effusions or acute bony abnormality.
IMPRESSION: Low lung volumes, bibasilar atelectasis.

## 2016-12-14 MED ORDER — MORPHINE SULFATE (PF) 4 MG/ML IV SOLN
4.0000 mg | Freq: Once | INTRAVENOUS | Status: AC
Start: 2016-12-14 — End: 2016-12-14
  Administered 2016-12-14: 4 mg via INTRAVENOUS
  Filled 2016-12-14 (×2): qty 1

## 2016-12-14 MED ORDER — DIPHENHYDRAMINE HCL 50 MG/ML IJ SOLN
25.0000 mg | Freq: Once | INTRAMUSCULAR | Status: AC
  Administered 2016-12-14: 25 mg via INTRAVENOUS
  Filled 2016-12-14 (×2): qty 1

## 2016-12-14 MED ORDER — PROCHLORPERAZINE EDISYLATE 5 MG/ML IJ SOLN
10.0000 mg | Freq: Once | INTRAMUSCULAR | Status: AC
  Administered 2016-12-14: 10 mg via INTRAVENOUS
  Filled 2016-12-14 (×2): qty 2

## 2016-12-14 NOTE — ED Notes (Signed)
Pt is sound asleep-- needed to be awakened to give meds-- states pain is still 10/10. Falls immediately back to sleep.

## 2016-12-14 NOTE — ED Provider Notes (Signed)
[facility] DEPT Provider Note   CSN: [id number] Arrival date & time: 12/14/16  [id number]     History   Chief Complaint Chief Complaint  Patient presents with  . Chest Pain    HPI Michael [name] is a 45 y.o. male with a past medical history significant for hypertension, migraines, documentation of schizophrenia, anxiety, and depression who presents with headache, chest pain, and reported increase in blood pressure. Patient reports that he has had a significant increase in his stress over the last 3 months after his son died in a car accident in 10-11-23. Patient reports that he has a long history of headaches and he has had one for the last 3 days. He does say that it began suddenly however it feels similar with photophobia and phonophobia. He said it is a throbbing pain in the front of his head. He denies blurred vision or neurologic deficits. He does report some nausea but no vomiting. He says that he checked his blood pressure today and it was in the [id number] systolic range. He says his blood pressure has been elevated in the past but not this high. He denies any neck pain or neck tenderness. He says that he was wakened at 2:30 AM with chest pain. He described it as a sharp chest pain in his left chest. It radiates down his left arm. He denied diaphoresis or associated shortness of breath. He said that he called EMS they gave him aspirin and nitroglycerin. The chest pain improved to a 7 out of 10 from a 9 out of 10 when he woke up. He reports the headache worsened however. He denies any fevers, chills, constipation, diarrhea, or dysuria. He denies palpitations. He is a former smoker but also has a strong family history of cardiac disease.    The history is provided by the patient and medical records. No language interpreter was used.  Chest Pain   This is a new problem. The current episode started 6 to 12 hours ago. The problem occurs constantly. The problem has been gradually improving. The pain  is associated with breathing. The pain is present in the substernal region. The pain is at a severity of 7/10. The pain is moderate. The quality of the pain is described as pleuritic. The pain radiates to the left arm. The symptoms are aggravated by deep breathing. Associated symptoms include nausea. Pertinent negatives include no abdominal pain, no back pain, no cough, no diaphoresis, no dizziness, no exertional chest pressure, no fever, no headaches, no irregular heartbeat, no leg pain, no lower extremity edema, no near-syncope, no numbness, no palpitations, no shortness of breath, no sputum production and no vomiting. He has tried nitroglycerin for the symptoms. The treatment provided mild relief.  Pertinent negatives for past medical history include no CAD.  His family medical history is significant for CAD and heart disease.      Past Medical History:  Diagnosis Date  . Anxiety   . Depression    Questionable per records. Not on any medication.   Marland Kitchen Hx of echocardiogram    a. Echo 12/13/12: Mild LVH, EF 99991111, grade 1 diastolic dysfunction, mild LAE, mild RVE, mild RAE  . Hypertension   . Insomnia   . Migraine   . Noncompliance   . OSA (obstructive sleep apnea)    a. Sleep Study 02/2013:  mod OSA, AHI 33 per hour, O2 sat nadir 85%  . Schizophrenia ([place])    Questionable per records. Not on any medication.  Patient Active Problem List   Diagnosis Date Noted  . Asthma 02/13/2016  . Non compliance w medication regimen 06/13/2015  . OSA (obstructive sleep apnea) 04/06/2013  . Abnormal EKG 11/08/2012  . Hypertension   . HTN (hypertension), malignant 04/29/2012  . Migraine headache with aura 04/29/2012    Past Surgical History:  Procedure Laterality Date  . Admission  04/2012   Malignant HTN, HA.  CT head negative, cardiology consult.    Marland Kitchen MANDIBLE SURGERY  1998   metal plate       Home Medications    Prior to Admission medications   Medication Sig Start Date End Date  Taking? Authorizing Provider  albuterol (PROVENTIL HFA;VENTOLIN HFA) 108 (90 Base) MCG/ACT inhaler Inhale 2 puffs into the lungs every 6 (six) hours as needed for wheezing or shortness of breath. 02/13/16   Chesley Mires, MD  amLODipine (NORVASC) 5 MG tablet  01/03/16   Historical Provider, MD  ciprofloxacin (CIPRO) 500 MG tablet Take 1 tablet (500 mg total) by mouth 2 (two) times daily. 03/29/16   Posey Boyer, MD  hydrochlorothiazide (HYDRODIURIL) 25 MG tablet Take 1 tablet (25 mg total) by mouth daily. 12/02/16   Tereasa Coop, PA-C  HYDROcodone-acetaminophen (NORCO) 5-325 MG tablet Take 1 tablet by mouth every 6 (six) hours as needed. 03/29/16   Posey Boyer, MD  lisinopril (PRINIVIL,ZESTRIL) 40 MG tablet TAKE 1 TABLET BY MOUTH EVERY DAY 10/30/16   Tereasa Coop, PA-C  potassium chloride (K-DUR) 10 MEQ tablet TAKE 1 TABLET(10 MEQ) BY MOUTH DAILY 09/13/15   Tereasa Coop, PA-C  potassium chloride (K-DUR) 10 MEQ tablet TAKE 1 TABLET(10 MEQ) BY MOUTH DAILY 09/23/16   Tereasa Coop, PA-C    Family History Family History  Problem Relation Age of Onset  . Cancer Father     prostate, colon- age was in his 64's  . Heart disease Father 63    MI   . Hypertension Father   . Diabetes Mother   . Hypertension Mother   . Hypertension Brother   . Cancer Maternal Grandfather   . Cancer Paternal Grandfather     Social History Social History  Substance Use Topics  . Smoking status: Never Smoker  . Smokeless tobacco: Never Used  . Alcohol use No     Allergies   Patient has no known allergies.   Review of Systems Review of Systems  Constitutional: Negative for chills, diaphoresis, fatigue and fever.  HENT: Negative for congestion and rhinorrhea.   Respiratory: Negative for cough, sputum production, chest tightness, shortness of breath, wheezing and stridor.   Cardiovascular: Positive for chest pain. Negative for palpitations, leg swelling and near-syncope.  Gastrointestinal: Positive for  nausea. Negative for abdominal pain, constipation, diarrhea and vomiting.  Genitourinary: Negative for flank pain.  Musculoskeletal: Negative for back pain, neck pain and neck stiffness.  Skin: Negative for rash and wound.  Neurological: Negative for dizziness, light-headedness, numbness and headaches.  Psychiatric/Behavioral: Negative for behavioral problems and confusion.  All other systems reviewed and are negative.    Physical Exam Updated Vital Signs BP (!) 158/105 (BP Location: Right Arm)   Pulse 72   Temp 98.6 F (37 C) (Oral)   Resp 16   Ht 5\' 9"  (1.753 m)   Wt (!) 312 lb (141.5 kg)   SpO2 97%   BMI 46.07 kg/m   Physical Exam  Constitutional: He is oriented to person, place, and time. He appears well-developed and well-nourished.  HENT:  Head: Normocephalic and atraumatic.  Mouth/Throat: Oropharynx is clear and moist. No oropharyngeal exudate.  Eyes: Conjunctivae and EOM are normal.  Neck: Normal range of motion. Neck supple.  Cardiovascular: Normal rate, regular rhythm and intact distal pulses.   No murmur heard. Pulmonary/Chest: Effort normal and breath sounds normal. No stridor. No tachypnea. No respiratory distress. He has no wheezes. He has no rhonchi. He has no rales. He exhibits tenderness.    Abdominal: Soft. There is no tenderness.  Musculoskeletal: He exhibits no edema or tenderness.  Neurological: He is alert and oriented to person, place, and time. No cranial nerve deficit or sensory deficit. He exhibits normal muscle tone. Coordination normal.  Skin: Skin is warm and dry. Capillary refill takes less than 2 seconds. No erythema. No pallor.  Psychiatric: He has a normal mood and affect.  Nursing note and vitals reviewed.    ED Treatments / Results  Labs (all labs ordered are listed, but only abnormal results are displayed) Labs Reviewed  BASIC METABOLIC PANEL - Abnormal; Notable for the following:       Result Value   Potassium 3.3 (*)    Glucose,  Bld 113 (*)    All other components within normal limits  CBC - Abnormal; Notable for the following:    RDW 15.6 (*)    All other components within normal limits  HEPATIC FUNCTION PANEL - Abnormal; Notable for the following:    Albumin 3.4 (*)    ALT 16 (*)    Bilirubin, Direct <0.1 (*)    All other components within normal limits  PROTIME-INR  BRAIN NATRIURETIC PEPTIDE  D-DIMER, QUANTITATIVE (NOT AT [facility])  I-STAT TROPOININ, ED  I-STAT TROPOININ, ED    EKG  EKG Interpretation  Date/Time:  Sunday December 14 2016 08:39:20 EST Ventricular Rate:  70 PR Interval:    QRS Duration: 108 QT Interval:  413 QTC Calculation: 446 R Axis:   0 Text Interpretation:  Sinus rhythm Nonspecific T abnormalities, diffuse leads When compared to prior, no significant changes were seen.  No STEMI Confirmed by Sherry Ruffing MD, [place] [phone]) on 12/14/2016 8:45:40 AM       Radiology Dg Chest 2 View  Result Date: 12/14/2016 CLINICAL DATA:  Sharp chest pain. EXAM: CHEST  2 VIEW COMPARISON:  04/30/2012 FINDINGS: Low lung volumes bibasilar atelectasis. Accentuation of heart size body low volumes. No effusions or acute bony abnormality. IMPRESSION: Low lung volumes, bibasilar atelectasis. Electronically Signed   By: Rolm Baptise M.D.   On: 12/14/2016 09:26   Ct Head Wo Contrast  Result Date: 12/14/2016 CLINICAL DATA:  Headache, nausea and vomiting EXAM: CT HEAD WITHOUT CONTRAST TECHNIQUE: Contiguous axial images were obtained from the base of the skull through the vertex without intravenous contrast. COMPARISON:  04/30/2012. FINDINGS: Brain: No evidence of acute infarction, hemorrhage, hydrocephalus, extra-axial collection or mass lesion/mass effect. Vascular: No hyperdense vessel or unexpected calcification. Skull: Normal. Negative for fracture or focal lesion. Sinuses/Orbits: A left maxillary sinus retention cyst is again demonstrated. Opacification of a single ethmoid air cell on each side, not  previously present. Unremarkable orbits. Other: None. IMPRESSION: 1. No acute abnormality. 2. Interval mild chronic bilateral ethmoid sinusitis. Electronically Signed   By: Claudie Revering M.D.   On: 12/14/2016 10:09    Procedures Procedures (including critical care time)  Medications Ordered in ED Medications  prochlorperazine (COMPAZINE) injection 10 mg (10 mg Intravenous Given 12/14/16 1313)  diphenhydrAMINE (BENADRYL) injection 25 mg (25 mg Intravenous Given 12/14/16 1312)  morphine  4 MG/ML injection 4 mg (4 mg Intravenous Given 12/14/16 1313)     Initial Impression / Assessment and Plan / ED Course  I have reviewed the triage vital signs and the nursing notes.  Pertinent labs & imaging results that were available during my care of the patient were reviewed by me and considered in my medical decision making (see chart for details).  Clinical Course     Davarian Fishel is a 45 y.o. male with a past medical history significant for hypertension, migraines, documentation of schizophrenia, anxiety, and depression who presents with headache, chest pain, and reported increase in blood pressure.  History and exam are seen above.  On exam, patient had clear lungs. Patient had tenderness in his left chest that reproduces the sharp pain. Patient had normal vision, and no focal neurologic deficits. Patient normal coordination, sensation, and strength. Patient had normal pulses in all extremities. Abdomen was nontender. Neck range of motion without symptoms.  Based on patient's complaint, patient will have a CT of the head to look for intracranial hemorrhage although feel this unlikely . Prior symptoms however, he does report it was sudden in onset and he has had elevated blood pressures. Imaging and lab testing will be obtained to look for etiology of chest pain. One consider diagnosis given increase in stress after losing his son is [place]. BNP will be added to look for new heart failure.  9:53  AM Nursing reported that patient had continued headache and episode of emesis in CT scanner. Patient will be given morphine for chest pain given tenderness and suspicion of muscle skeletal pain. Patient be on Compazine and Benadryl for likely migrainous headache worsened by his previous nitroglycerin.  Anticipate reassessment following diagnostic testing.  HEART score calculated as a four.  Diagnostic testing returns showing negative troponin times two. D dimer was negative. BNP nonelevated. Laboratory testing showed mild hypokalemia of 3.3.   CT head showed no acute intracranial abnormality. Just x-ray shows no pneumonia.  Patient reported his headache and chest pain had Completely resolved after medications. Given chest tenderness, suspecting musculoskeletal type chest pain in setting of negative workup. Patient was advised on level of risk for major adverse cardiac event the next 30 days based on heart score. Patient felt that he was feeling well enough to go home and have close follow-up with PCP and a cardiologist in several days. Shared decision-making conversation was held and patient understood risks of leaving with a heart score greater than three.  Patient understood return precautions for any new or worsening symptoms. Patient discharged in good condition.   Final Clinical Impressions(s) / ED Diagnoses   Final diagnoses:  Acute nonintractable headache, unspecified headache type  Atypical chest pain    New Prescriptions Discharge Medication List as of 12/14/2016  3:32 PM      Clinical Impression: 1. Acute nonintractable headache, unspecified headache type   2. Atypical chest pain     Disposition: Discharge  Condition: Good  I have discussed the results, Dx and Tx plan with the pt(& family if present). He/she/they expressed understanding and agree(s) with the plan. Discharge instructions discussed at great length. Strict return precautions discussed and pt &/or family  have verbalized understanding of the instructions. No further questions at time of discharge.    Discharge Medication List as of 12/14/2016  3:32 PM      Follow Up: [name], MD [address] [place] [id number] [phone]        Daquana Paddock J  Marbin Olshefski, MD 12/14/16 2102

## 2016-12-14 NOTE — ED Notes (Signed)
Patient transported to X-ray 

## 2016-12-14 NOTE — ED Notes (Signed)
Pt verbalized understanding of DC teaching and ned to follow up with PCP. NAD. VSS. Being driven home by family.

## 2016-12-14 NOTE — Discharge Instructions (Signed)
Please follow-up with your PCP and possibly a cardiologist in the next few days. If any symptoms return or worsen, please return to the nearest emergency room.

## 2016-12-14 NOTE — ED Notes (Signed)
Per dr tegeler-- hold meds at this time.

## 2016-12-14 NOTE — ED Triage Notes (Signed)
Per [facility]: Pt from home, pt was sleeping and had chest pian that woke him up from his sleep. States that pain is 8/10, does not radiate and it is sharp and reproducible with palpation. Pt has HX hypertension,  Pt states that he has had a headache for 3 days.  Fire department administered 4 ASA 324 mg, and 1 nitro. The nitro did not help with the pain.

## 2016-12-16 ENCOUNTER — Telehealth: Payer: Self-pay

## 2016-12-16 DIAGNOSIS — E876 Hypokalemia: Secondary | ICD-10-CM

## 2016-12-16 NOTE — Telephone Encounter (Signed)
PT is calling and needs a refill on lisinopril  And amlodipine hydrochlorothiazide and potassium chloride he was just in hospital and they told him to call and follow up when he got released I informed him he may need an office visit in order to receive these medications  Pt has a balance that must be paid and he Is supposed to call the billing office and will then set up an appointment with dr. Carlota Raspberry   Please advise  [phone]

## 2016-12-18 ENCOUNTER — Other Ambulatory Visit: Payer: Self-pay | Admitting: Physician Assistant

## 2016-12-18 DIAGNOSIS — E876 Hypokalemia: Secondary | ICD-10-CM

## 2016-12-18 MED ORDER — LISINOPRIL 40 MG PO TABS: 40.0000 mg | ORAL_TABLET | Freq: Every day | ORAL | 0 refills | Status: DC

## 2016-12-18 MED ORDER — HYDROCHLOROTHIAZIDE 25 MG PO TABS: 25.0000 mg | ORAL_TABLET | Freq: Every day | ORAL | 0 refills | Status: DC

## 2016-12-18 MED ORDER — POTASSIUM CHLORIDE ER 10 MEQ PO TBCR: EXTENDED_RELEASE_TABLET | ORAL | 0 refills | Status: DC

## 2016-12-18 MED ORDER — AMLODIPINE BESYLATE 10 MG PO TABS: 10.0000 mg | ORAL_TABLET | Freq: Every day | ORAL | 0 refills | Status: DC

## 2016-12-18 NOTE — Telephone Encounter (Signed)
05/2016 last ov and labs 

## 2016-12-23 ENCOUNTER — Other Ambulatory Visit: Payer: Self-pay | Admitting: Family Medicine

## 2016-12-23 DIAGNOSIS — E876 Hypokalemia: Secondary | ICD-10-CM

## 2016-12-23 NOTE — Telephone Encounter (Signed)
05/2016 last ov and lab Needs ov

## 2017-04-02 ENCOUNTER — Ambulatory Visit (INDEPENDENT_AMBULATORY_CARE_PROVIDER_SITE_OTHER): Payer: Medicare HMO | Admitting: Physician Assistant

## 2017-04-02 ENCOUNTER — Other Ambulatory Visit: Payer: Self-pay | Admitting: Physician Assistant

## 2017-04-02 VITALS — BP 183/152 | HR 77 | Temp 98.3°F | Resp 18 | Ht 69.0 in | Wt 300.8 lb

## 2017-04-02 DIAGNOSIS — L723 Sebaceous cyst: Secondary | ICD-10-CM

## 2017-04-02 DIAGNOSIS — I10 Essential (primary) hypertension: Secondary | ICD-10-CM | POA: Diagnosis not present

## 2017-04-02 DIAGNOSIS — L089 Local infection of the skin and subcutaneous tissue, unspecified: Secondary | ICD-10-CM

## 2017-04-02 MED ORDER — HYDROCHLOROTHIAZIDE 25 MG PO TABS: 25.0000 mg | ORAL_TABLET | Freq: Every day | ORAL | 0 refills | Status: DC

## 2017-04-02 MED ORDER — LISINOPRIL 40 MG PO TABS: 40.0000 mg | ORAL_TABLET | Freq: Every day | ORAL | 0 refills | Status: DC

## 2017-04-02 MED ORDER — CEPHALEXIN 500 MG PO CAPS: 500.0000 mg | ORAL_CAPSULE | Freq: Two times a day (BID) | ORAL | 0 refills | Status: DC

## 2017-04-02 MED ORDER — AMLODIPINE BESYLATE 10 MG PO TABS: ORAL_TABLET | ORAL | 0 refills | Status: DC

## 2017-04-02 NOTE — Patient Instructions (Addendum)
WOUND CARE Please return in 2 days to have your Packing removed or sooner if you have concerns. Marland Kitchen Keep area clean and dry for 24 hours. Do not remove bandage, if applied. . After 24 hours, you may wash wound gently with mild soap and warm water. Reapply a new bandage after cleaning wound, if directed. . Notify the office if you experience any of the following signs of infection: Swelling, redness, pus drainage, streaking, fever >101.0 F . Notify the office if you experience excessive bleeding that does not stop after 15-20 minutes of constant, firm pressure.  In terms of elevated blood pressure, Your goal is <140/90. If you start to have chest pain, blurred vision, shortness of breath, severe headache, lower leg swelling, or nausea/vomiting please seek care immediately here or at the ED. We will recheck your bp at the next visit and do appropriate blood work at this time. I have also placed a referral to both cardiology and sleep study. You should hear from them in the next week.    Thank you for letting me participate in your health and well being.      IF you received an x-ray today, you will receive an invoice from [facility]. Please contact [facility] at [phone] with questions or concerns regarding your invoice.   IF you received labwork today, you will receive an invoice from [facility]. Please contact [facility] at [phone] with questions or concerns regarding your invoice.   Our billing staff will not be able to assist you with questions regarding bills from these companies.  You will be contacted with the lab results as soon as they are available. The fastest way to get your results is to activate your My Chart account. Instructions are located on the last page of this paperwork. If you have not heard from [place] regarding the results in 2 weeks, please contact this office.

## 2017-04-02 NOTE — Progress Notes (Addendum)
Michael [name]  MRN: [id number] DOB: May 15, 1971  Subjective:  Michael [name] is a 46 y.o. male seen in office today for a chief complaint of painful knot on chest. Notes this knot has been present for years but it just recently become painful, red, and warm. Denies purulent drainage, fever, or chills.   In terms of elevated bp, pt notes he is taking his medication daily but is almost out. He needs refills today. States he thinks his bp is so high because he has not gotten a CPAP for his sleep apnea and needs a referral to have another sleep study. Denies chest pain, heart palpitations, SOB, nausea, vomiting, dizziness, headache, and visual disturbances. Denies smoking.   Review of Systems  Constitutional: Negative for diaphoresis and fatigue.  Cardiovascular: Negative for leg swelling.    Patient Active Problem List   Diagnosis Date Noted  . Asthma 02/13/2016  . Non compliance w medication regimen 06/13/2015  . OSA (obstructive sleep apnea) 04/06/2013  . Abnormal EKG 11/08/2012  . Hypertension   . HTN (hypertension), malignant 04/29/2012  . Migraine headache with aura 04/29/2012    Current Outpatient Prescriptions on File Prior to Visit  Medication Sig Dispense Refill  . albuterol (PROVENTIL HFA;VENTOLIN HFA) 108 (90 Base) MCG/ACT inhaler Inhale 2 puffs into the lungs every 6 (six) hours as needed for wheezing or shortness of breath. 1 Inhaler 6  . potassium chloride (K-DUR) 10 MEQ tablet TAKE 1 TABLET(10 MEQ) BY MOUTH DAILY 30 tablet 0  . ciprofloxacin (CIPRO) 500 MG tablet Take 1 tablet (500 mg total) by mouth 2 (two) times daily. (Patient not taking: Reported on 12/14/2016) 10 tablet 0  . HYDROcodone-acetaminophen (NORCO) 5-325 MG tablet Take 1 tablet by mouth every 6 (six) hours as needed. (Patient not taking: Reported on 12/14/2016) 10 tablet 0   No current facility-administered medications on file prior to visit.     No Known Allergies    Social History   Social History  .  Marital status: Divorced    Spouse name: N/A  . Number of children: 5  . Years of education: N/A   Occupational History  . Unemployed//disabled    Social History Main Topics  . Smoking status: Never Smoker  . Smokeless tobacco: Never Used  . Alcohol use No  . Drug use: No  . Sexual activity: Yes     Comment: per pt's blue [employer] form - 1 sex partner in last 12 months   Other Topics Concern  . Not on file   Social History Narrative   Marital status: single; dating with fiance      Children: 5 children, no grandchildren.     Lives: with fiance, fiance children.      Employment:  Disability for learning disabilities since 2012.          Objective:  BP (!) 183/152 (Cuff Size: Large)   Pulse 77   Temp 98.3 F (36.8 C) (Oral)   Resp 18   Ht 5\' 9"  (1.753 m)   Wt (!) 300 lb 12.8 oz (136.4 kg)   SpO2 96%   BMI 44.42 kg/m   Physical Exam  Constitutional: He is oriented to person, place, and time and well-developed, well-nourished, and in no distress. No distress.  HENT:  Head: Normocephalic and atraumatic.  Eyes: Conjunctivae are normal.  Neck: Normal range of motion.  Cardiovascular: Normal rate, regular rhythm, normal heart sounds and intact distal pulses.   Pulmonary/Chest: Effort normal.    Musculoskeletal:  Right lower leg: He exhibits no swelling.       Left lower leg: He exhibits no swelling.  Neurological: He is alert and oriented to person, place, and time. Gait normal.  Skin: Skin is warm and dry.  Psychiatric: Affect normal.  Vitals reviewed.    BP Readings from Last 3 Encounters:  04/02/17 (!) 183/152  12/14/16 (!) 142/121  06/03/16 122/72   EKG show sinus rhythm at 73 bpm with nonspecific T wave abnormality and LVH. No acute ST or T wave changes. Findings presented to Dr. Mitchel Honour.   PROCEDURE NOTE: Infected sebaceous cyst incision and drainage Verbal consent obtained. Local anesthesia with 2cc of 1% lidocaine with epinephrine. Sterile prep  and drape. A 15mm punch biopsy was used to make incision directly over central punctum, an 11 blade scalpel was used to extend the incision. Discharge of moderate amounts of pus,  serosanguinous fluid and copious amounts of keratinous cyst material expressed. Sac remnants were removed.  Wound cavity was explored with curved hemostats and aggressively packed with 0.25" plain packing. Cleansed and dressed. After care instructions provided. Patient to return to clinic in 2 days  for reevaluation/repacking.   Assessment and Plan :  1. Essential hypertension Uncontrolled in office. Pt is asymptomatic today. Given referral to both cardiology and neurology. Informed him that we will obtain HTN blood work at his next visit as he needed I&D today. Return in 2 days for follow up. Given strict ED precautions.  - EKG 12-Lead - lisinopril (PRINIVIL,ZESTRIL) 40 MG tablet; Take 1 tablet (40 mg total) by mouth daily.  Dispense: 30 tablet; Refill: 0 - amLODipine (NORVASC) 10 MG tablet; TAKE 1 TABLET(10 MG) BY MOUTH DAILY  Dispense: 30 tablet; Refill: 0 - hydrochlorothiazide (HYDRODIURIL) 25 MG tablet; Take 1 tablet (25 mg total) by mouth daily.  Dispense: 30 tablet; Refill: 0 - Ambulatory referral to Cardiology - Ambulatory referral to Neurology  2. Infected sebaceous cyst Wound care instructions given. Return in 2 days for reevaluation.  - cephALEXin (KEFLEX) 500 MG capsule; Take 1 capsule (500 mg total) by mouth 2 (two) times daily.  Dispense: 14 capsule; Refill: 0 - WOUND CULTURE  Tenna Delaine PA-C  [facility] 04/02/2017 1:49 PM

## 2017-04-03 ENCOUNTER — Emergency Department (HOSPITAL_COMMUNITY): Payer: Medicare HMO

## 2017-04-03 ENCOUNTER — Encounter (HOSPITAL_COMMUNITY): Payer: Self-pay | Admitting: Emergency Medicine

## 2017-04-03 ENCOUNTER — Emergency Department (HOSPITAL_COMMUNITY)
Admission: EM | Admit: 2017-04-03 | Discharge: 2017-04-03 | Disposition: A | Discharge status: Home / Self Care | Payer: Medicare HMO | Attending: Emergency Medicine | Admitting: Emergency Medicine

## 2017-04-03 DIAGNOSIS — J45909 Unspecified asthma, uncomplicated: Secondary | ICD-10-CM | POA: Diagnosis not present

## 2017-04-03 DIAGNOSIS — L02213 Cutaneous abscess of chest wall: Secondary | ICD-10-CM | POA: Insufficient documentation

## 2017-04-03 DIAGNOSIS — R51 Headache: Secondary | ICD-10-CM | POA: Insufficient documentation

## 2017-04-03 DIAGNOSIS — R0602 Shortness of breath: Secondary | ICD-10-CM | POA: Diagnosis present

## 2017-04-03 DIAGNOSIS — I10 Essential (primary) hypertension: Secondary | ICD-10-CM | POA: Insufficient documentation

## 2017-04-03 DIAGNOSIS — Z79899 Other long term (current) drug therapy: Secondary | ICD-10-CM | POA: Insufficient documentation

## 2017-04-03 LAB — CBC WITH DIFFERENTIAL/PLATELET
BASOS ABS: 0 10*3/uL (ref 0.0–0.1)
BASOS PCT: 0 %
Eosinophils Absolute: 0 10*3/uL (ref 0.0–0.7)
Eosinophils Relative: 1 %
HEMATOCRIT: 41.6 % (ref 39.0–52.0)
HEMOGLOBIN: 14.5 g/dL (ref 13.0–17.0)
Lymphocytes Relative: 36 %
Lymphs Abs: 2 10*3/uL (ref 0.7–4.0)
MCH: 29.1 pg (ref 26.0–34.0)
MCHC: 34.9 g/dL (ref 30.0–36.0)
MCV: 83.4 fL (ref 78.0–100.0)
Monocytes Absolute: 0.2 10*3/uL (ref 0.1–1.0)
Monocytes Relative: 4 %
NEUTROS ABS: 3.2 10*3/uL (ref 1.7–7.7)
NEUTROS PCT: 59 %
Platelets: 218 10*3/uL (ref 150–400)
RBC: 4.99 MIL/uL (ref 4.22–5.81)
RDW: 15 % (ref 11.5–15.5)
WBC: 5.4 10*3/uL (ref 4.0–10.5)

## 2017-04-03 LAB — I-STAT TROPONIN, ED: Troponin i, poc: 0.01 ng/mL (ref 0.00–0.08)

## 2017-04-03 LAB — COMPREHENSIVE METABOLIC PANEL
ALBUMIN: 3.9 g/dL (ref 3.5–5.0)
ALK PHOS: 64 U/L (ref 38–126)
ALT: 17 U/L (ref 17–63)
AST: 19 U/L (ref 15–41)
Anion gap: 8 (ref 5–15)
BILIRUBIN TOTAL: 0.5 mg/dL (ref 0.3–1.2)
BUN: 16 mg/dL (ref 6–20)
CALCIUM: 9 mg/dL (ref 8.9–10.3)
CO2: 29 mmol/L (ref 22–32)
CREATININE: 0.98 mg/dL (ref 0.61–1.24)
Chloride: 101 mmol/L (ref 101–111)
GFR calc Af Amer: >60 mL/min (ref >60)
GLUCOSE: 129 mg/dL — AB (ref 65–99)
POTASSIUM: 3.1 mmol/L — AB (ref 3.5–5.1)
Sodium: 138 mmol/L (ref 135–145)
TOTAL PROTEIN: 8.3 g/dL — AB (ref 6.5–8.1)

## 2017-04-03 LAB — BRAIN NATRIURETIC PEPTIDE: B NATRIURETIC PEPTIDE 5: 20.2 pg/mL (ref 0.0–100.0)

## 2017-04-03 IMAGING — CR DG CHEST 2V
2 series · 2 of 2 positions shown · non-contrast
Comparison: [DATE] chest radiograph.

CLINICAL DATA: Dyspnea, cough

EXAM:
CHEST  2 VIEW

[w chest pa]
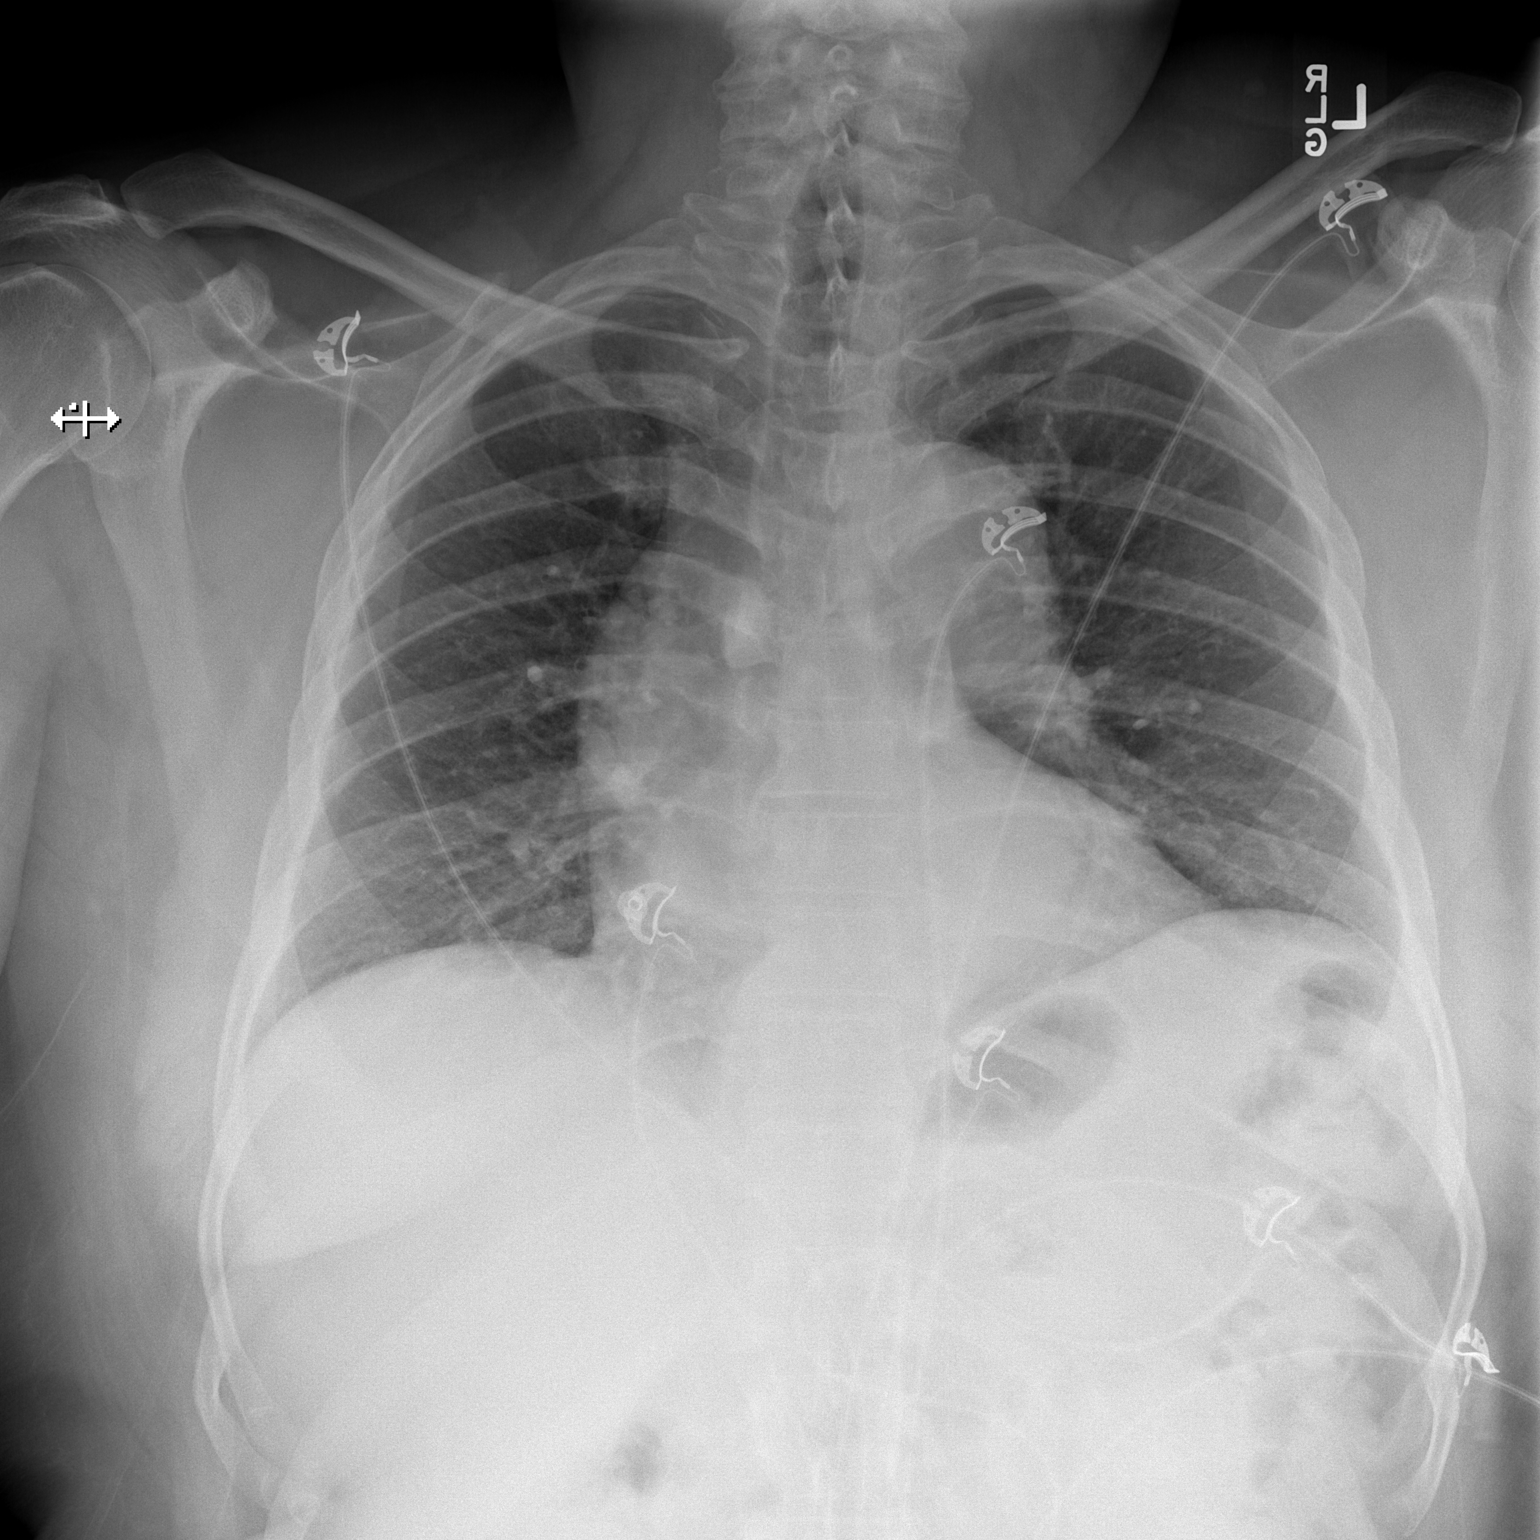

[w chest lat]
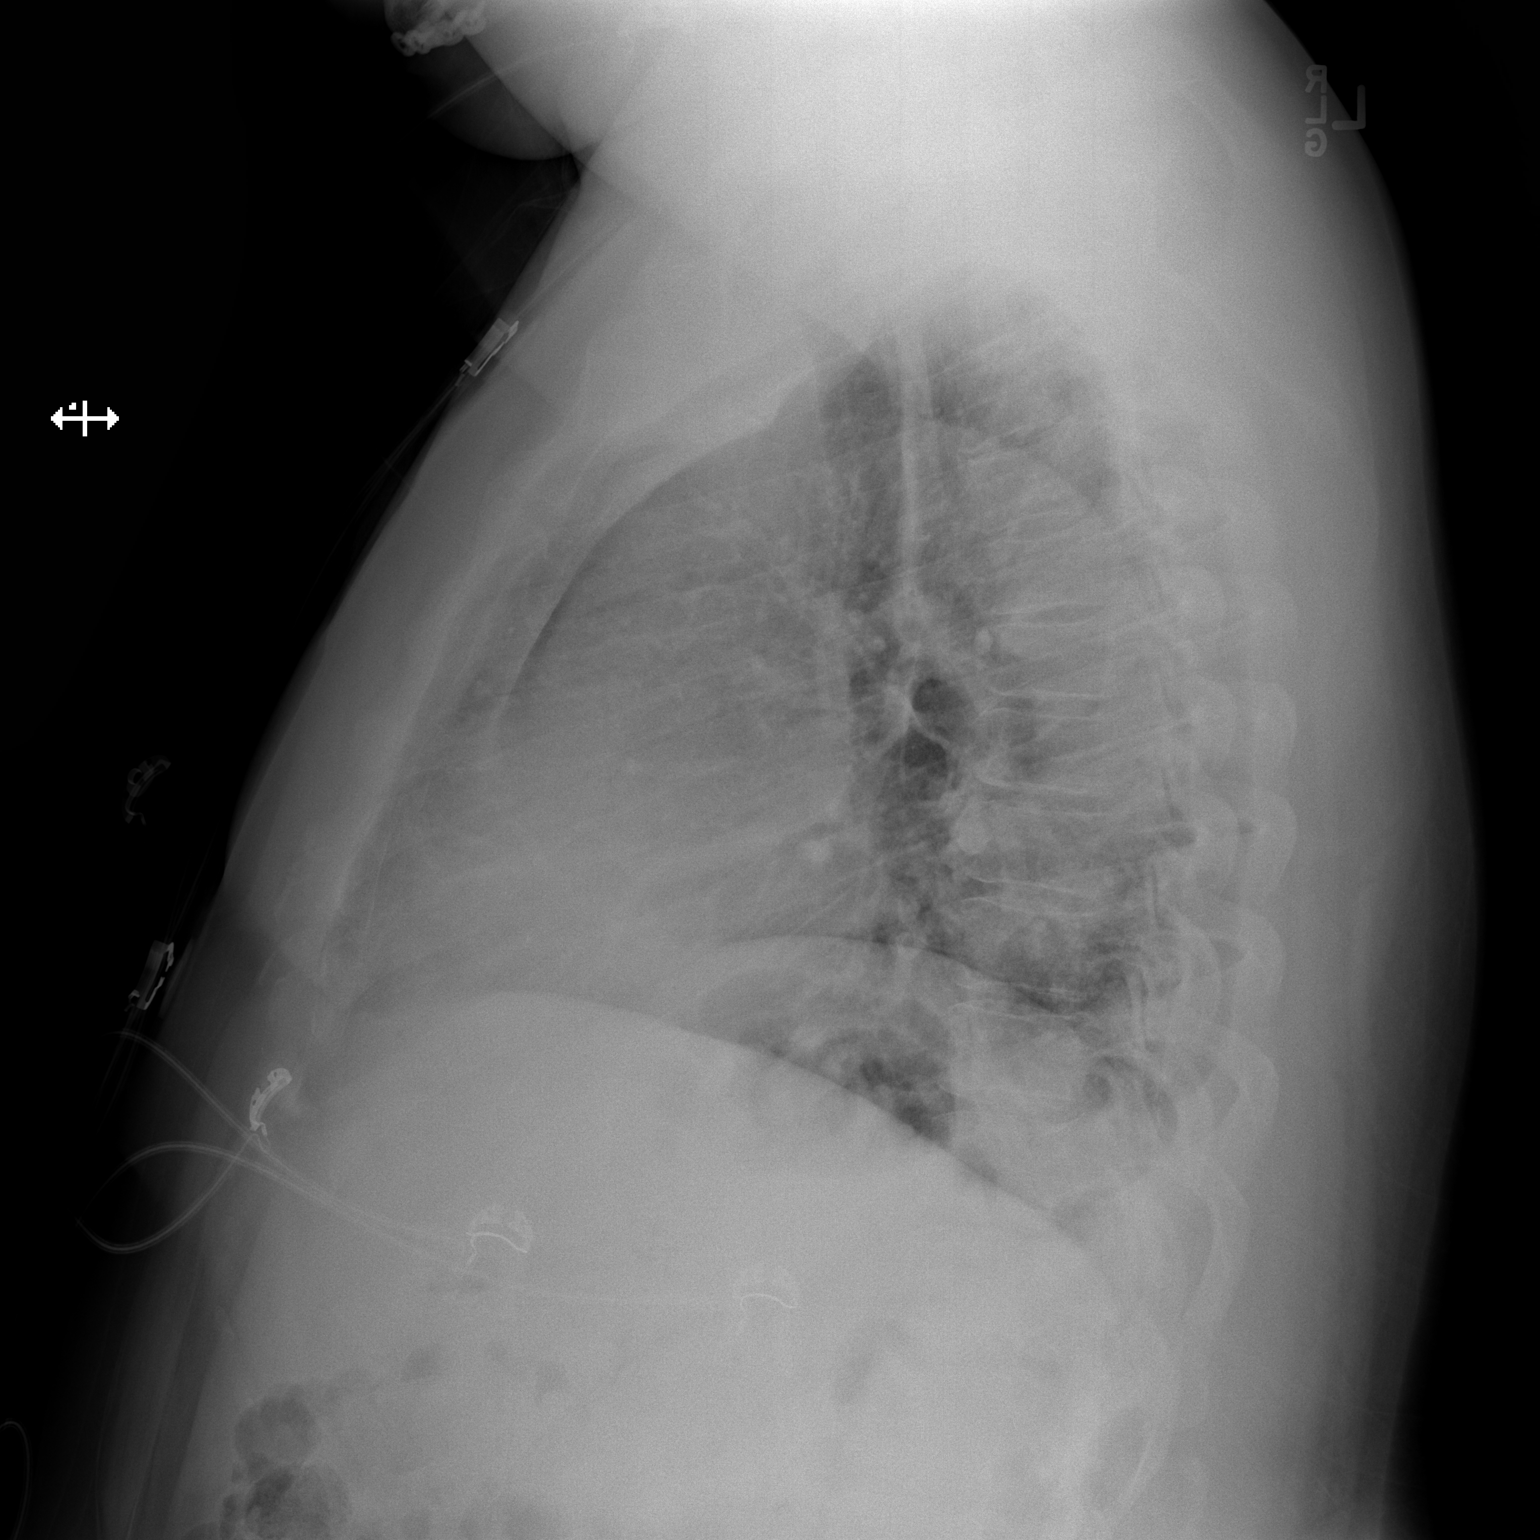

[2 of 2 positions shown; findings below may reference images not displayed]

FINDINGS: Stable cardiomediastinal silhouette with normal heart size and
tortuous thoracic aorta. No pneumothorax. No pleural effusion.
Stable mild elevation of the left hemidiaphragm. No pulmonary edema.
No acute consolidative airspace disease.
IMPRESSION: No active cardiopulmonary disease.

## 2017-04-03 IMAGING — CT CT HEAD W/O CM
3 of 4 series · 14 of 47 positions shown, 16 images · non-contrast
Comparison: [DATE]

CLINICAL DATA: Hypertension with right-sided headache and
dizziness.

EXAM:
CT HEAD WITHOUT CONTRAST
TECHNIQUE: Contiguous axial images were obtained from the base of the skull
through the vertex without intravenous contrast.

[Series 2: head w/o · axial · non-contrast · 0.45mm/px · z∈[-167,-37]mm · 8 of 32 slices shown, 10 images]
[im 3/32  brain]
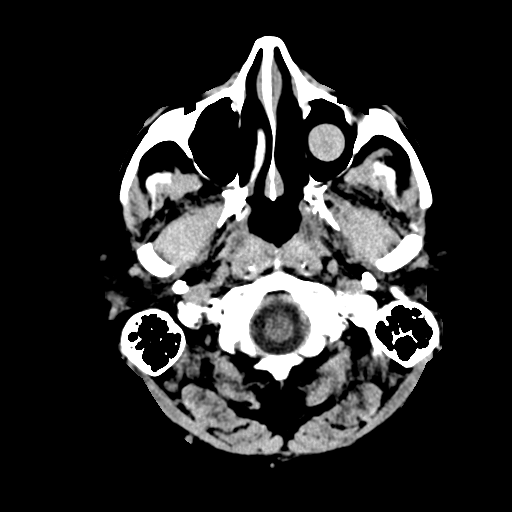
[im 3/32  bone]
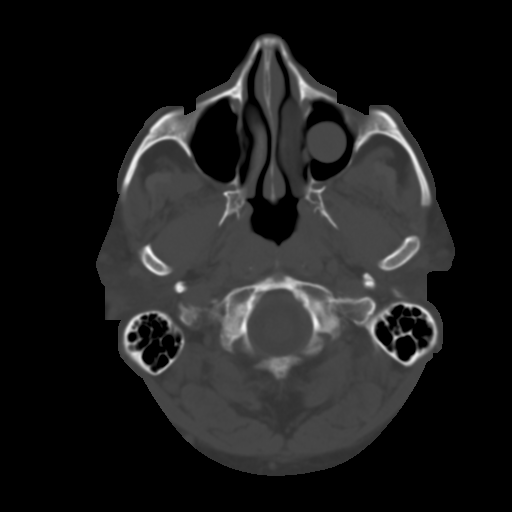
[im 7/32  brain]
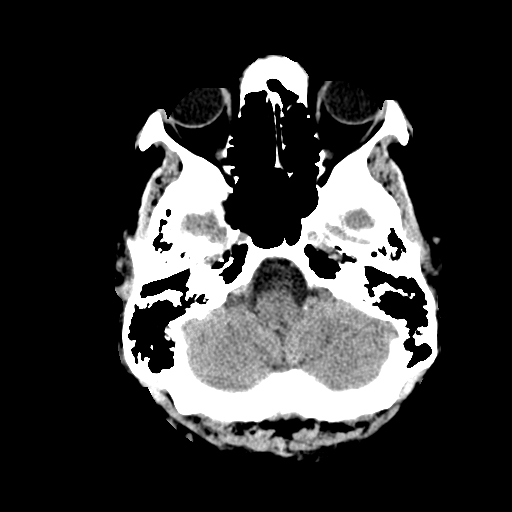
[im 12/32  brain]
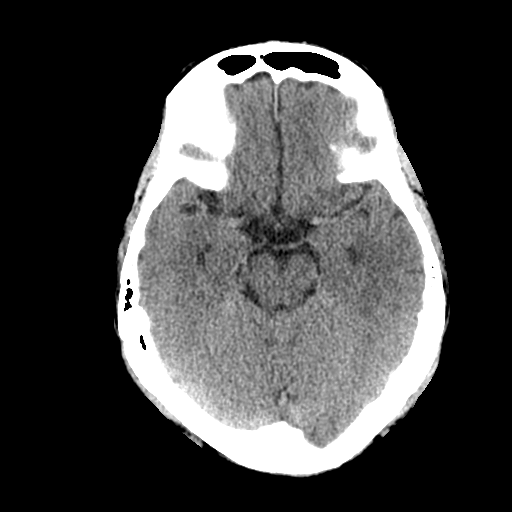
[im 14/32  brain]
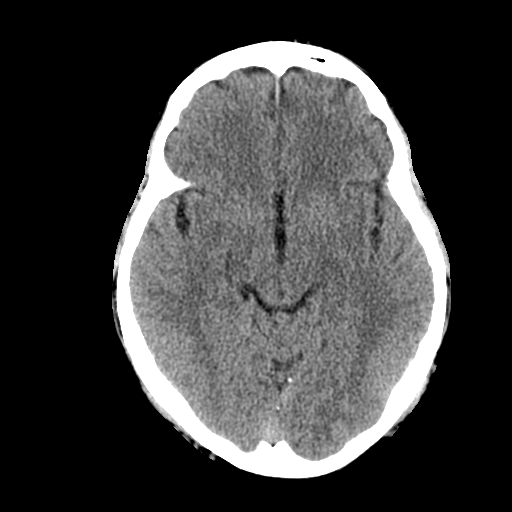
[im 18/32  brain]
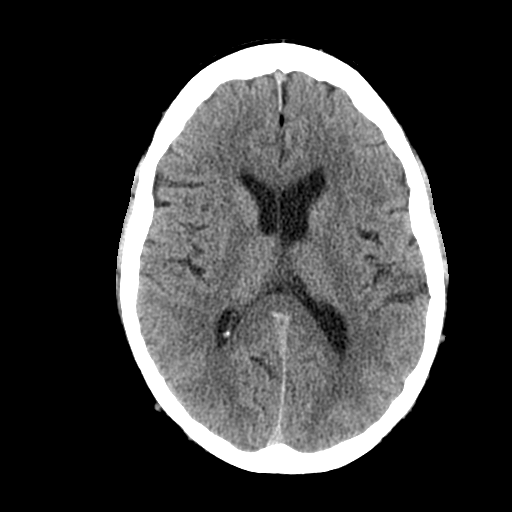
[im 18/32  bone]
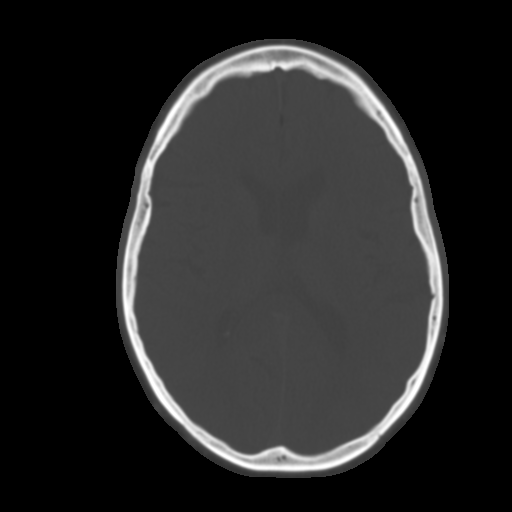
[im 20/32  brain]
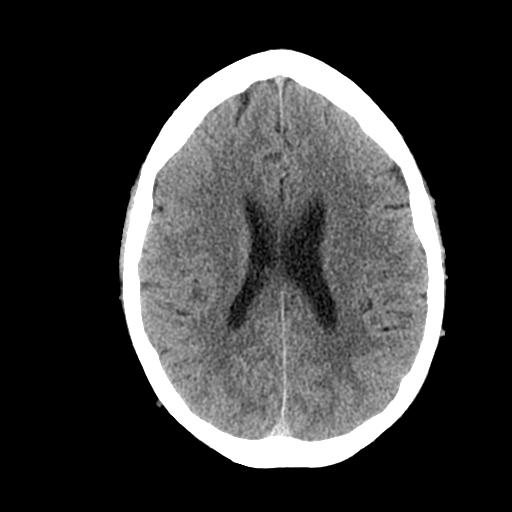
[im 25/32  brain]
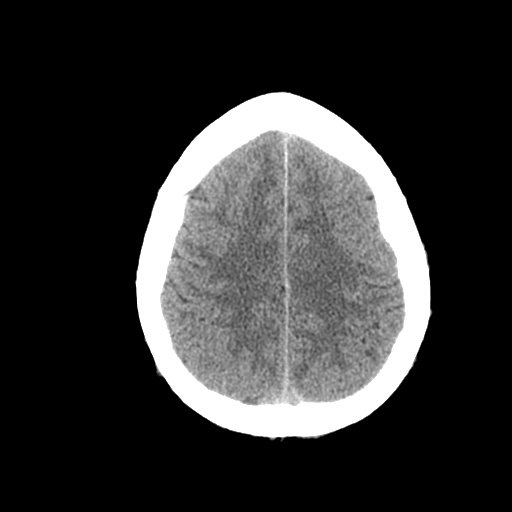
[im 29/32  brain]
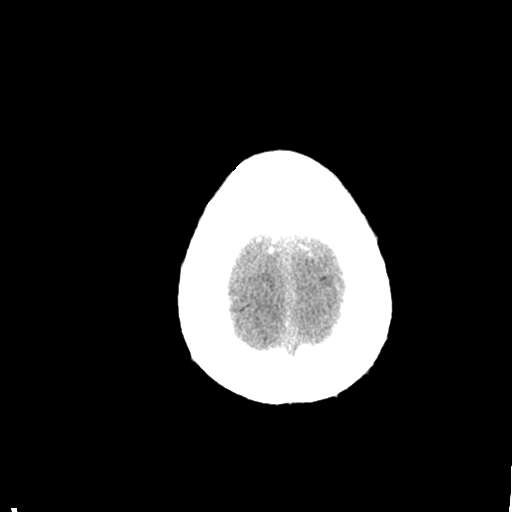

[Series 4: coronal · coronal · 0.30mm/px · 3 of 68 slices shown]
[im 23/68  brain]
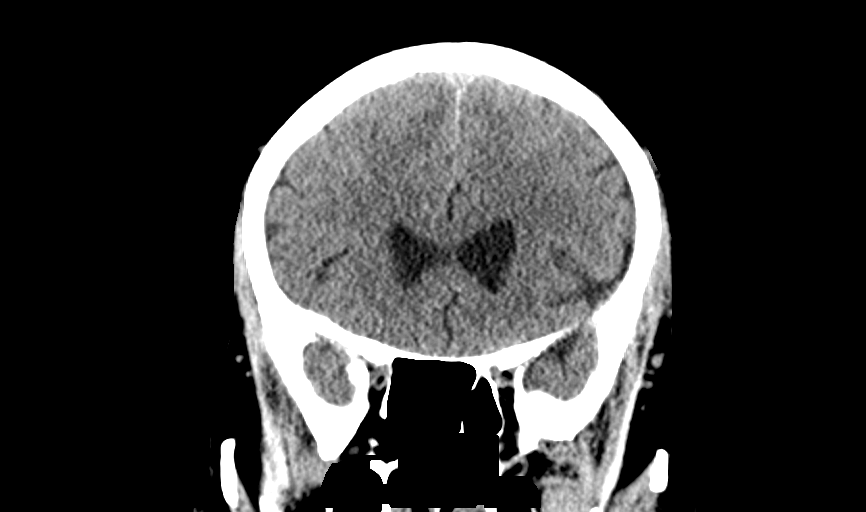
[im 30/68  brain]
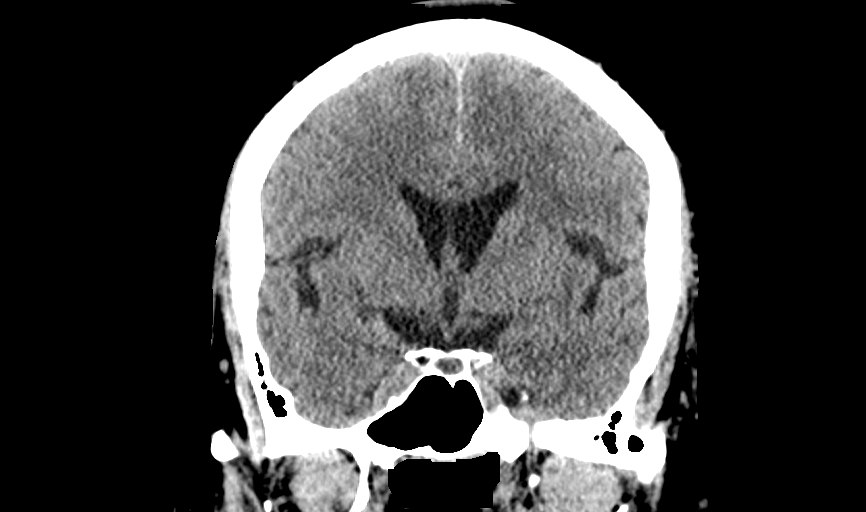
[im 38/68  brain]
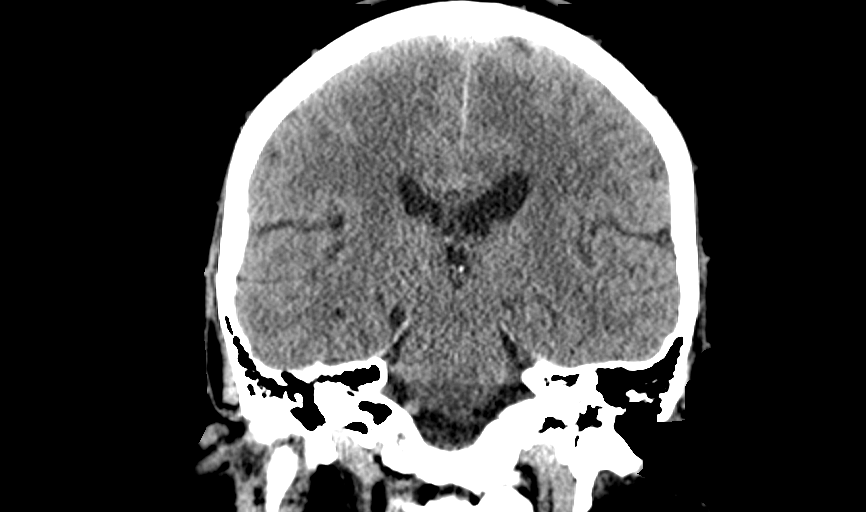

[Series 5: sagittal · sagittal · 0.30mm/px · 3 of 60 slices shown]
[im 20/60  brain]
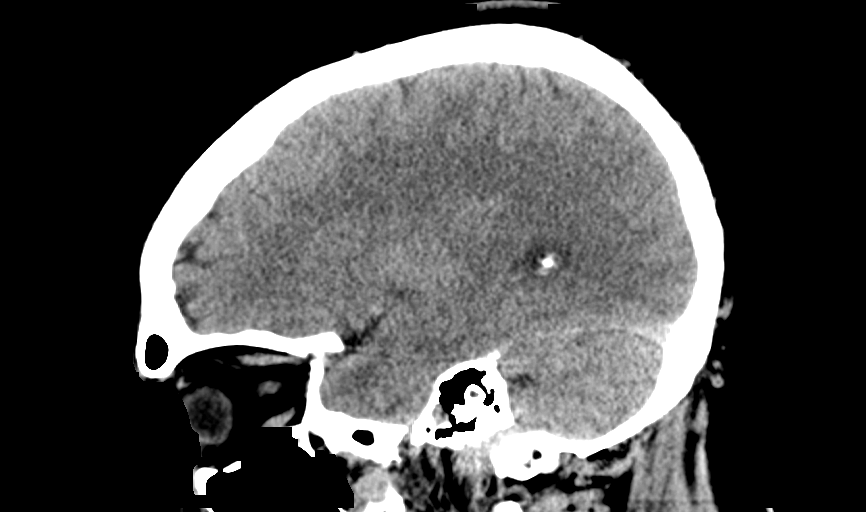
[im 30/60  brain]
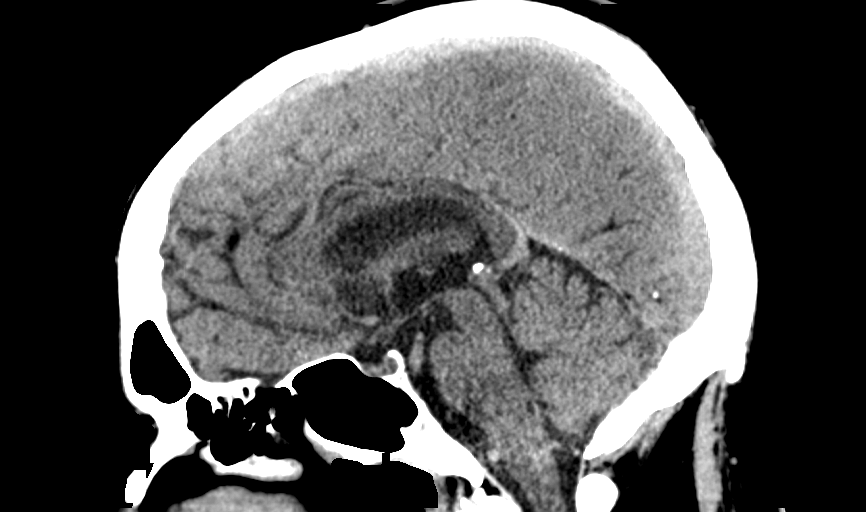
[im 40/60  brain]
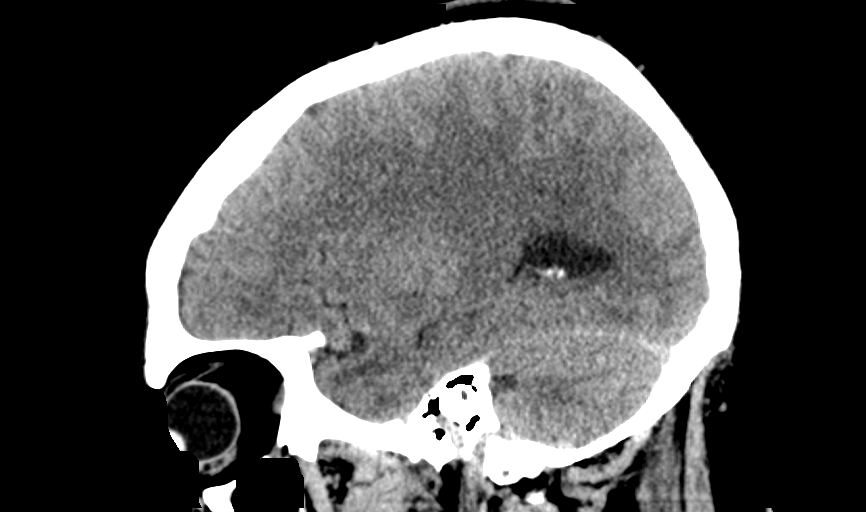

[14 of 47 positions shown; findings below may reference images not displayed]

FINDINGS: Brain: There is no evidence for acute hemorrhage, hydrocephalus,
mass lesion, or abnormal extra-axial fluid collection. No definite
CT evidence for acute infarction.

Vascular: No hyperdense vessel or unexpected calcification.

Skull: No evidence for fracture. No worrisome lytic or sclerotic
lesion.

Sinuses/Orbits: Stable left maxillary chronic ethmoid sinus disease
again noted, potentially mucocele or retention cyst. Remaining
visualized paranasal sinuses are clear. Visualized portions of the
globes and intraorbital fat are unremarkable.

Other: None.
IMPRESSION: Stable.  No acute intracranial abnormality.

## 2017-04-03 MED ORDER — DOXYCYCLINE HYCLATE 100 MG PO CAPS: 100.0000 mg | ORAL_CAPSULE | Freq: Two times a day (BID) | ORAL | 0 refills | Status: AC

## 2017-04-03 MED ORDER — HYDROCODONE-ACETAMINOPHEN 5-325 MG PO TABS
2.0000 | ORAL_TABLET | Freq: Once | ORAL | Status: AC
  Administered 2017-04-03: 2 via ORAL
  Filled 2017-04-03: qty 2

## 2017-04-03 MED ORDER — AMLODIPINE BESYLATE 5 MG PO TABS
10.0000 mg | ORAL_TABLET | Freq: Once | ORAL | Status: AC
  Administered 2017-04-03: 10 mg via ORAL
  Filled 2017-04-03: qty 2

## 2017-04-03 MED ORDER — HYDROCODONE-ACETAMINOPHEN 5-325 MG PO TABS: 1.0000 | ORAL_TABLET | ORAL | 0 refills | Status: DC | PRN

## 2017-04-03 MED ORDER — LISINOPRIL 40 MG PO TABS
40.0000 mg | ORAL_TABLET | Freq: Once | ORAL | Status: AC
  Administered 2017-04-03: 40 mg via ORAL
  Filled 2017-04-03: qty 1

## 2017-04-03 MED ORDER — POTASSIUM CHLORIDE CRYS ER 20 MEQ PO TBCR
40.0000 meq | EXTENDED_RELEASE_TABLET | Freq: Once | ORAL | Status: AC
  Administered 2017-04-03: 40 meq via ORAL
  Filled 2017-04-03: qty 2

## 2017-04-03 MED ORDER — LABETALOL HCL 5 MG/ML IV SOLN
20.0000 mg | Freq: Once | INTRAVENOUS | Status: AC
  Administered 2017-04-03: 20 mg via INTRAVENOUS
  Filled 2017-04-03: qty 4

## 2017-04-03 MED ORDER — HYDROCHLOROTHIAZIDE 12.5 MG PO CAPS
25.0000 mg | ORAL_CAPSULE | Freq: Once | ORAL | Status: AC
  Administered 2017-04-03: 25 mg via ORAL
  Filled 2017-04-03: qty 2

## 2017-04-03 NOTE — ED Provider Notes (Signed)
[facility] DEPT Provider Note   CSN: [id number] Arrival date & time: 04/03/17  0710     History   Chief Complaint Chief Complaint  Patient presents with  . Hypertension  . Shortness of Breath    HPI Michael [name] is a 46 y.o. male.  HPI 46 year old male with past medical history as below who presents with worsening hypotension and headache. The patient was recently seen in his primary care office yesterday. He had a small abscess on his left chest wall incised and drained. He was placed on Keflex. He states that since then, he has had worsening hypertension. He has noticed a mild, aching, throbbing, generalized headache that is worse with movement and bright light. He also endorses mild shortness of breath, although this is chronic. Denies any significant associated chest pain. Denies any nausea or vomiting. He does state that the antibiotic didn't somewhat hurt his medication. He does also endorses pain at his incision and drainage site and admits this may be elevating his blood pressure. He has not taken any of his blood pressure medications today. No other recent medication changes. No fevers.   Past Medical History:  Diagnosis Date  . Anxiety   . Depression    Questionable per records. Not on any medication.   Marland Kitchen Hx of echocardiogram    a. Echo 12/13/12: Mild LVH, EF 44-03%, grade 1 diastolic dysfunction, mild LAE, mild RVE, mild RAE  . Hypertension   . Insomnia   . Migraine   . Noncompliance   . OSA (obstructive sleep apnea)    a. Sleep Study 02/2013:  mod OSA, AHI 33 per hour, O2 sat nadir 85%  . Schizophrenia ([place])    Questionable per records. Not on any medication.     Patient Active Problem List   Diagnosis Date Noted  . Asthma 02/13/2016  . Non compliance w medication regimen 06/13/2015  . OSA (obstructive sleep apnea) 04/06/2013  . Abnormal EKG 11/08/2012  . Hypertension   . HTN (hypertension), malignant 04/29/2012  . Migraine headache with aura 04/29/2012     Past Surgical History:  Procedure Laterality Date  . Admission  04/2012   Malignant HTN, HA.  CT head negative, cardiology consult.    Marland Kitchen MANDIBLE SURGERY  1998   metal plate       Home Medications    Prior to Admission medications   Medication Sig Start Date End Date Taking? Authorizing Provider  albuterol (PROVENTIL HFA;VENTOLIN HFA) 108 (90 Base) MCG/ACT inhaler Inhale 2 puffs into the lungs every 6 (six) hours as needed for wheezing or shortness of breath. 02/13/16  Yes Chesley Mires, MD  amLODipine (NORVASC) 10 MG tablet TAKE 1 TABLET(10 MG) BY MOUTH DAILY 04/02/17  Yes Leonie Douglas, PA-C  cephALEXin (KEFLEX) 500 MG capsule Take 1 capsule (500 mg total) by mouth 2 (two) times daily. 04/02/17  Yes Leonie Douglas, PA-C  hydrochlorothiazide (HYDRODIURIL) 25 MG tablet Take 1 tablet (25 mg total) by mouth daily. 04/02/17  Yes Leonie Douglas, PA-C  lisinopril (PRINIVIL,ZESTRIL) 40 MG tablet Take 1 tablet (40 mg total) by mouth daily. 04/02/17  Yes Leonie Douglas, PA-C  potassium chloride (K-DUR) 10 MEQ tablet TAKE 1 TABLET(10 MEQ) BY MOUTH DAILY 12/23/16  Yes [name], MD  ciprofloxacin (CIPRO) 500 MG tablet Take 1 tablet (500 mg total) by mouth 2 (two) times daily. Patient not taking: Reported on 12/14/2016 03/29/16   Posey Boyer, MD  doxycycline (VIBRAMYCIN) 100 MG capsule Take 1  capsule (100 mg total) by mouth 2 (two) times daily. 04/03/17 04/10/17  Duffy Bruce, MD  HYDROcodone-acetaminophen (NORCO/VICODIN) 5-325 MG tablet Take 1-2 tablets by mouth every 4 (four) hours as needed for severe pain. 04/03/17   Duffy Bruce, MD    Family History Family History  Problem Relation Age of Onset  . Cancer Father     prostate, colon- age was in his 74's  . Heart disease Father 12    MI   . Hypertension Father   . Diabetes Mother   . Hypertension Mother   . Hypertension Brother   . Cancer Maternal Grandfather   . Cancer Paternal Grandfather     Social  History Social History  Substance Use Topics  . Smoking status: Never Smoker  . Smokeless tobacco: Never Used  . Alcohol use No     Allergies   Patient has no known allergies.   Review of Systems Review of Systems  Constitutional: Positive for fatigue. Negative for chills and fever.  HENT: Positive for congestion. Negative for rhinorrhea.   Eyes: Negative for visual disturbance.  Respiratory: Positive for shortness of breath. Negative for cough and wheezing.   Cardiovascular: Positive for chest pain (Chest wall pain at the site of drainage). Negative for leg swelling.  Gastrointestinal: Negative for abdominal pain, diarrhea, nausea and vomiting.  Genitourinary: Negative for dysuria and flank pain.  Musculoskeletal: Negative for neck pain and neck stiffness.  Skin: Positive for wound. Negative for rash.  Allergic/Immunologic: Negative for immunocompromised state.  Neurological: Positive for headaches. Negative for syncope and weakness.  All other systems reviewed and are negative.    Physical Exam Updated Vital Signs BP (!) 136/104   Pulse 71   Temp 97.7 F (36.5 C) (Oral)   Resp 16   Ht 5\' 9"  (1.753 m)   Wt 299 lb 9.6 oz (135.9 kg)   SpO2 100%   BMI 44.24 kg/m   Physical Exam  Constitutional: He is oriented to person, place, and time. He appears well-developed and well-nourished. No distress.  HENT:  Head: Normocephalic and atraumatic.  Eyes: Conjunctivae are normal.  Neck: Neck supple.  Cardiovascular: Normal rate, regular rhythm and normal heart sounds.  Exam reveals no friction rub.   No murmur heard. Pulmonary/Chest: Effort normal and breath sounds normal. No respiratory distress. He has no wheezes.  Left upper chest wall with wound packing in place status post drainage of small follicular abscess. No surrounding erythema. No drainage or bleeding.  Abdominal: He exhibits no distension.  Musculoskeletal: He exhibits no edema (No lower extremity edema).   Neurological: He is alert and oriented to person, place, and time. He exhibits normal muscle tone.  Skin: Skin is warm. Capillary refill takes less than 2 seconds.  Psychiatric: He has a normal mood and affect.  Nursing note and vitals reviewed.   Neurological Exam:  Mental Status: Alert and oriented to person, place, and time. Attention and concentration normal. Speech clear. Recent memory is intact. Cranial Nerves: Visual fields grossly intact. EOMI and PERRLA. No nystagmus noted. Facial sensation intact at forehead, maxillary cheek, and chin/mandible bilaterally. No facial asymmetry or weakness. Hearing grossly normal. Uvula is midline, and palate elevates symmetrically. Normal SCM and trapezius strength. Tongue midline without fasciculations. Motor: Muscle strength 5/5 in proximal and distal UE and LE bilaterally. No pronator drift. Muscle tone normal. Reflexes: 2+ and symmetrical in all four extremities.  Sensation: Intact to light touch in upper and lower extremities distally bilaterally.  Gait: Normal without  ataxia. Coordination: Normal FTN bilaterally.    ED Treatments / Results  Labs (all labs ordered are listed, but only abnormal results are displayed) Labs Reviewed  COMPREHENSIVE METABOLIC PANEL - Abnormal; Notable for the following:       Result Value   Potassium 3.1 (*)    Glucose, Bld 129 (*)    Total Protein 8.3 (*)    All other components within normal limits  CBC WITH DIFFERENTIAL/PLATELET  BRAIN NATRIURETIC PEPTIDE  I-STAT TROPOININ, ED    EKG  EKG Interpretation  Date/Time:  Friday April 03 2017 07:17:19 EDT Ventricular Rate:  73 PR Interval:    QRS Duration: 102 QT Interval:  403 QTC Calculation: 445 R Axis:   -10 Text Interpretation:  Sinus rhythm Probable left ventricular hypertrophy Nonspecific T abnrm, anterolateral leads When compared with previous: Diffuse TWI persist, likely from LVH and repolarization Confirmed by Karle Desrosier MD, Heyden Jaber [phone]) on  04/03/2017 9:38:48 AM       Radiology Dg Chest 2 View  Result Date: 04/03/2017 CLINICAL DATA:  Dyspnea, cough EXAM: CHEST  2 VIEW COMPARISON:  12/14/2016 chest radiograph. FINDINGS: Stable cardiomediastinal silhouette with normal heart size and tortuous thoracic aorta. No pneumothorax. No pleural effusion. Stable mild elevation of the left hemidiaphragm. No pulmonary edema. No acute consolidative airspace disease. IMPRESSION: No active cardiopulmonary disease. Electronically Signed   By: Ilona Sorrel M.D.   On: 04/03/2017 07:31   Ct Head Wo Contrast  Result Date: 04/03/2017 CLINICAL DATA:  Hypertension with right-sided headache and dizziness. EXAM: CT HEAD WITHOUT CONTRAST TECHNIQUE: Contiguous axial images were obtained from the base of the skull through the vertex without intravenous contrast. COMPARISON:  12/14/2016 FINDINGS: Brain: There is no evidence for acute hemorrhage, hydrocephalus, mass lesion, or abnormal extra-axial fluid collection. No definite CT evidence for acute infarction. Vascular: No hyperdense vessel or unexpected calcification. Skull: No evidence for fracture. No worrisome lytic or sclerotic lesion. Sinuses/Orbits: Stable left maxillary chronic ethmoid sinus disease again noted, potentially mucocele or retention cyst. Remaining visualized paranasal sinuses are clear. Visualized portions of the globes and intraorbital fat are unremarkable. Other: None. IMPRESSION: Stable.  No acute intracranial abnormality. Electronically Signed   By: Misty Stanley M.D.   On: 04/03/2017 09:34    Procedures Procedures (including critical care time)  Medications Ordered in ED Medications  lisinopril (PRINIVIL,ZESTRIL) tablet 40 mg (40 mg Oral Given 04/03/17 0835)  amLODipine (NORVASC) tablet 10 mg (10 mg Oral Given 04/03/17 0833)  hydrochlorothiazide (MICROZIDE) capsule 25 mg (25 mg Oral Given 04/03/17 0834)  potassium chloride SA (K-DUR,KLOR-CON) CR tablet 40 mEq (40 mEq Oral Given 04/03/17 0834)   HYDROcodone-acetaminophen (NORCO/VICODIN) 5-325 MG per tablet 2 tablet (2 tablets Oral Given 04/03/17 1100)  labetalol (NORMODYNE,TRANDATE) injection 20 mg (20 mg Intravenous Given 04/03/17 1103)     Initial Impression / Assessment and Plan / ED Course  I have reviewed the triage vital signs and the nursing notes.  Pertinent labs & imaging results that were available during my care of the patient were reviewed by me and considered in my medical decision making (see chart for details).    46 yo M here with mild HA, SOB in setting of elevated BP after recent I&D of chest wall abscess. I suspect his pain from recent I&D has led to mild, now symptomatic HTN. Regarding his wound, no signs of significant recurrence of abscess, cellulitis, or deep tissue infection. He states he does not like keflex with mild stomach upset so will switch to  doxy. Regarding his BP, no evidence of acute HTN emergency. CT head neg, no signs of ICH or CVA. His EKG is unchanged and trop neg without signs of ACS or CHF. Renal function at baseline. K mildly low and replaced - he is on chronic replacement for this. He feels improved with analgesia and HTN control. Will d/c with strict adherence to antiHTN meds, analgesia, and outpt follow-up.   Final Clinical Impressions(s) / ED Diagnoses   Final diagnoses:  Essential hypertension  Chest wall abscess    New Prescriptions Discharge Medication List as of 04/03/2017 10:48 AM    START taking these medications   Details  doxycycline (VIBRAMYCIN) 100 MG capsule Take 1 capsule (100 mg total) by mouth 2 (two) times daily., Starting Fri 04/03/2017, Until Fri 04/10/2017, Print         Duffy Bruce, MD 04/03/17 [phone]

## 2017-04-03 NOTE — ED Triage Notes (Addendum)
Patient had a cyst removed yesterday. Patient is complaining of having a headache and high blood pressure since yesterday. No N/V/D. Patient is also complaining of SOB that is occurring off and on. Patient states when he get hot flashes that is when it feels like it is hard to breathe.

## 2017-04-03 NOTE — ED Notes (Signed)
Pt states SOB  

## 2017-04-03 NOTE — ED Notes (Signed)
Patient transported to X-ray 

## 2017-04-03 NOTE — ED Notes (Signed)
Discharge instructions, follow up care, and rx x2 reviewed with patient. Patient verbalized understanding. 

## 2017-04-04 ENCOUNTER — Ambulatory Visit (INDEPENDENT_AMBULATORY_CARE_PROVIDER_SITE_OTHER): Payer: Medicare HMO | Admitting: Urgent Care

## 2017-04-04 VITALS — BP 146/120 | HR 87 | Temp 98.1°F | Resp 18 | Ht 69.0 in | Wt 297.4 lb

## 2017-04-04 DIAGNOSIS — L723 Sebaceous cyst: Secondary | ICD-10-CM

## 2017-04-04 DIAGNOSIS — L089 Local infection of the skin and subcutaneous tissue, unspecified: Secondary | ICD-10-CM | POA: Diagnosis not present

## 2017-04-04 DIAGNOSIS — R51 Headache: Secondary | ICD-10-CM

## 2017-04-04 DIAGNOSIS — I10 Essential (primary) hypertension: Secondary | ICD-10-CM | POA: Diagnosis not present

## 2017-04-04 DIAGNOSIS — R0789 Other chest pain: Secondary | ICD-10-CM

## 2017-04-04 DIAGNOSIS — R519 Headache, unspecified: Secondary | ICD-10-CM

## 2017-04-04 DIAGNOSIS — E876 Hypokalemia: Secondary | ICD-10-CM | POA: Insufficient documentation

## 2017-04-04 NOTE — Progress Notes (Signed)
  MRN: [id number] DOB: 08/27/1971  Subjective:   Michael [name] is a 46 y.o. male presenting for chief complaint of Wound Check (upper chest wound) and Medication Refill (Potassium (Potassium was low @ last visit & med was not refilled))  Sebaceous cyst - Last OV was 04/02/2017. Had I&D performed for infected sebaceous cyst. Was started on Keflex, wound culture is negative. Patient did not do well with Keflex, was switched to doxycycline 04/03/2017 during his visit to ER at [facility]. Refer to ER note for more detail. Today, reports tenderness over wound and difficulty with movement. The pain over his wound can be sharp and elicit shortness of breath. Has not tried any medications including the hydrocodone prescribed yesterday.  HTN - Patient seen 04/03/2017 for HTN urgency. Work up was negative including ecg, head CT, troponin. ACS and CHF ruled out, recommended to have strict adherence to medical therapy for his BP and close outpatient f/u. Admits mild headache. Denies dizziness, blurred vision, chest pain,, heart racing, palpitations, nausea, vomiting, abdominal pain, hematuria, lower leg swelling. Denies smoking cigarettes or drinking alcohol.   Shayan has a current medication list which includes the following prescription(s): albuterol, amlodipine, cephalexin, doxycycline, hydrochlorothiazide, hydrocodone-acetaminophen, lisinopril, and potassium chloride. Also has No Known Allergies. Michael [name]  has a past medical history of Anxiety; Depression; echocardiogram; Hypertension; Insomnia; Migraine; Noncompliance; OSA (obstructive sleep apnea); and Schizophrenia (Michael [name]). Also  has a past surgical history that includes Mandible surgery (1998) and Admission (04/2012).  Objective:   Vitals: BP (!) 146/120   Pulse 87   Temp 98.1 F (36.7 C) (Oral)   Resp 18   Ht 5\' 9"  (1.753 m)   Wt 297 lb 6 oz (134.9 kg)   SpO2 97%   BMI 43.91 kg/m   BP Readings from Last 3 Encounters:  04/04/17 (!) 146/120  04/03/17  (!) 136/104  04/02/17 (!) 183/152   Physical Exam  Constitutional: He is oriented to person, place, and time. He appears well-developed and well-nourished.  HENT:  Mouth/Throat: Oropharynx is clear and moist.  Eyes: EOM are normal. Pupils are equal, round, and reactive to light. No scleral icterus.  Cardiovascular: Normal rate, regular rhythm and intact distal pulses.  Exam reveals no gallop and no friction rub.   No murmur heard. Pulmonary/Chest: No respiratory distress. He has no wheezes. He has no rales.  Abdominal: Soft. Bowel sounds are normal. He exhibits no distension and no mass. There is no tenderness. There is no guarding.  Musculoskeletal: He exhibits no edema or tenderness.  Neurological: He is alert and oriented to person, place, and time. No cranial nerve deficit.  Skin: Skin is warm and dry.     Psychiatric: He has a normal mood and affect.   WOUND CARE: Packing removed, packing has mixture of purulence, sebaceous material and serosanguinous fluid. Less than 1cc of serosanguinous fluid expressed. Wound repacked loosely with 1/4" packing. Cleansed and dressed.  Assessment and Plan :   1. Infected sebaceous cyst 2. Chest wall pain - Wound care reviewed, change dressing at home. RTC in 2 days for wound recheck. Use ibuprofen and Tylenol for pain over wound, chest wall.  3. Essential hypertension 4. Hypokalemia 5. Morbid obesity ([place]) 6. Nonintractable headache, unspecified chronicity pattern, unspecified headache type - Potassium level pending. Physical exam finding reassuring. Emphasized lifestyle modifications. Return-to-clinic precautions discussed, patient verbalized understanding.   Jaynee Eagles, PA-C [facility] [phone] 04/04/2017  9:30 AM

## 2017-04-04 NOTE — Patient Instructions (Addendum)
Epidermal Cyst Removal Epidermal cyst removal is a procedure to remove a sac of oily material that forms under your skin (epidermal cyst). Epidermal cysts may also be called epidermoid cysts or keratin cysts. Normally, the skin secretes this oily material through a gland or a hair follicle. This type of cyst usually results when a skin gland or hair follicle becomes blocked. You may need this procedure if you have an epidermal cyst that becomes large, uncomfortable, or infected. Tell a health care provider about:  Any allergies you have.  All medicines you are taking, including vitamins, herbs, eye drops, creams, and over-the-counter medicines.  Any problems you or family members have had with anesthetic medicines.  Any blood disorders you have.  Any surgeries you have had.  Any medical conditions you have. What are the risks? Generally, this is a safe procedure. However, problems may occur, including:  Developing another cyst.  Bleeding.  Infection.  Scarring. What happens before the procedure?  Ask your health care provider about:  Changing or stopping your regular medicines. This is especially important if you are taking diabetes medicines or blood thinners.  Taking medicines such as aspirin and ibuprofen. These medicines can thin your blood. Do not take these medicines before your procedure if your health care provider instructs you not to.  If you have an infected cyst, you may have to take antibiotic medicines before or after the cyst removal. Take your antibiotics as directed by your health care provider. Finish all of the medicine even if you start to feel better.  Take a shower on the morning of your procedure. Your health care provider may ask you to use a germ-killing (antiseptic) soap. What happens during the procedure?  You will be given a medicine that numbs the area (local anesthetic).  The skin around the cyst will be cleaned with a germ-killing solution  (antiseptic).  Your health care provider will make a small surgical incision over the cyst.  The cyst will be separated from the surrounding tissues that are under your skin.  If possible, the cyst will be removed undamaged (intact).  If the cyst bursts (ruptures), it will need to be removed in pieces.  After the cyst is removed, your health care provider will control any bleeding and close the incision with small stitches (sutures). Small incisions may not need sutures, and the bleeding will be controlled by applying direct pressure with gauze.  Your health care provider may apply antibiotic ointment and a light bandage (dressing) over the incision. This procedure may vary among health care providers and hospitals. What happens after the procedure?  If your cyst ruptured during surgery, you may need to take antibiotic medicine. If you were prescribed an antibiotic medicine, finish all of it even if you start to feel better. This information is not intended to replace advice given to you by your health care provider. Make sure you discuss any questions you have with your health care provider. Document Released: 12/12/2000 Document Revised: 05/22/2016 Document Reviewed: 08/30/2014 Elsevier Interactive Patient Education  2017 [employer].     Hypokalemia Hypokalemia means that the amount of potassium in the blood is lower than normal.Potassium is a chemical that helps regulate the amount of fluid in the body (electrolyte). It also stimulates muscle tightening (contraction) and helps nerves work properly.Normally, most of the body's potassium is inside of cells, and only a very small amount is in the blood. Because the amount in the blood is so small, minor changes to  potassium levels in the blood can be life-threatening. What are the causes? This condition may be caused by:  Antibiotic medicine.  Diarrhea or vomiting. Taking too much of a medicine that helps you have a bowel movement  (laxative) can cause diarrhea and lead to hypokalemia.  Chronic kidney disease (CKD).  Medicines that help the body get rid of excess fluid (diuretics).  Eating disorders, such as bulimia.  Low magnesium levels in the body.  Sweating a lot. What are the signs or symptoms? Symptoms of this condition include:  Weakness.  Constipation.  Fatigue.  Muscle cramps.  Mental confusion.  Skipped heartbeats or irregular heartbeat (palpitations).  Tingling or numbness. How is this diagnosed? This condition is diagnosed with a blood test. How is this treated? Hypokalemia can be treated by taking potassium supplements by mouth or adjusting the medicines that you take. Treatment may also include eating more foods that contain a lot of potassium. If your potassium level is very low, you may need to get potassium through an IV tube in one of your veins and be monitored in the hospital. Follow these instructions at home:  Take over-the-counter and prescription medicines only as told by your health care provider. This includes vitamins and supplements.  Eat a healthy diet. A healthy diet includes fresh fruits and vegetables, whole grains, healthy fats, and lean proteins.  If instructed, eat more foods that contain a lot of potassium, such as:  Nuts, such as peanuts and pistachios.  Seeds, such as sunflower seeds and pumpkin seeds.  Peas, lentils, and lima beans.  Whole grain and bran cereals and breads.  Fresh fruits and vegetables, such as apricots, avocado, bananas, cantaloupe, kiwi, oranges, tomatoes, asparagus, and potatoes.  Orange juice.  Tomato juice.  Red meats.  Yogurt.  Keep all follow-up visits as told by your health care provider. This is important. Contact a health care provider if:  You have weakness that gets worse.  You feel your heart pounding or racing.  You vomit.  You have diarrhea.  You have diabetes (diabetes mellitus) and you have trouble  keeping your blood sugar (glucose) in your target range. Get help right away if:  You have chest pain.  You have shortness of breath.  You have vomiting or diarrhea that lasts for more than 2 days.  You faint. This information is not intended to replace advice given to you by your health care provider. Make sure you discuss any questions you have with your health care provider. Document Released: 12/15/2005 Document Revised: 08/02/2016 Document Reviewed: 08/02/2016 Elsevier Interactive Patient Education  2017 [employer].     IF you received an x-ray today, you will receive an invoice from [facility]. Please contact [facility] at [phone] with questions or concerns regarding your invoice.   IF you received labwork today, you will receive an invoice from [facility]. Please contact [facility] at [phone] with questions or concerns regarding your invoice.   Our billing staff will not be able to assist you with questions regarding bills from these companies.  You will be contacted with the lab results as soon as they are available. The fastest way to get your results is to activate your My Chart account. Instructions are located on the last page of this paperwork. If you have not heard from [place] regarding the results in 2 weeks, please contact this office.

## 2017-04-05 LAB — POTASSIUM: Potassium: 3.3 mmol/L — ABNORMAL LOW (ref 3.5–5.2)

## 2017-04-05 LAB — WOUND CULTURE

## 2017-04-27 ENCOUNTER — Ambulatory Visit: Payer: Medicare Other | Admitting: Pulmonary Disease

## 2017-05-06 ENCOUNTER — Encounter (INDEPENDENT_AMBULATORY_CARE_PROVIDER_SITE_OTHER): Payer: Self-pay

## 2017-05-06 ENCOUNTER — Ambulatory Visit (INDEPENDENT_AMBULATORY_CARE_PROVIDER_SITE_OTHER): Payer: Medicare HMO | Admitting: Neurology

## 2017-05-06 ENCOUNTER — Encounter: Payer: Self-pay | Admitting: Neurology

## 2017-05-06 VITALS — BP 156/110 | HR 79 | Ht 69.0 in | Wt 305.6 lb

## 2017-05-06 DIAGNOSIS — G4733 Obstructive sleep apnea (adult) (pediatric): Secondary | ICD-10-CM

## 2017-05-06 DIAGNOSIS — R0683 Snoring: Secondary | ICD-10-CM

## 2017-05-06 DIAGNOSIS — R51 Headache: Secondary | ICD-10-CM | POA: Diagnosis not present

## 2017-05-06 DIAGNOSIS — Z789 Other specified health status: Secondary | ICD-10-CM | POA: Diagnosis not present

## 2017-05-06 DIAGNOSIS — G4724 Circadian rhythm sleep disorder, free running type: Secondary | ICD-10-CM | POA: Diagnosis not present

## 2017-05-06 DIAGNOSIS — I10 Essential (primary) hypertension: Secondary | ICD-10-CM

## 2017-05-06 DIAGNOSIS — R519 Headache, unspecified: Secondary | ICD-10-CM

## 2017-05-06 DIAGNOSIS — E669 Obesity, unspecified: Secondary | ICD-10-CM | POA: Diagnosis not present

## 2017-05-06 MED ORDER — ALPRAZOLAM 0.5 MG PO TABS: 0.5000 mg | ORAL_TABLET | Freq: Every evening | ORAL | 0 refills | Status: DC | PRN

## 2017-05-06 NOTE — Progress Notes (Signed)
SLEEP MEDICINE CLINIC   Provider:  Larey Seat, M D  Primary Care Physician:  Michael Agreste, MD   Referring Provider: [name], MD  and Michael [name], [place]    Chief Complaint  Patient presents with  . Rm 4  . New Patient (Initial Visit)    Mom, Michael [name] and 48 yr old  . Headache    Pt had recent ED visit d/t essential htn. Appears to be non-compliant w/ BP meds. Dx w/ sleep apnea and needs Cpap but has not had recent sleep study. C/o weight fluctuations, fatigue, headache, sleepiness.    HPI:  Michael [name] is a 46 y.o. male , seen here as in a referral  from Dr. Carlota [name] for a sleep consultation,  Michael [name] is a new patient to our practice seen here on 05/06/2017 for a sleep consultation. He had on April 5 been seen at primary care at [facility] and was diagnosed with elevated blood pressure, he has been on antihypertensive medication but needed refills at the time. He has a history of asthma, abnormal EKG, malignant hypertension and migraine headaches with aura as well as obstructive sleep apnea. He was diagnosed with OSA over 4 years ago but has not gotten a CPAP. He needed to be re-referred for a new sleep study.  Michael [name] is obese, reports lethargy, insomnia, shortness of breath, headaches, witnessed snoring and has frequent apneas, and is currently not gainfully employed, he presented here today with his mother Michael [name] and is four-year-old nephew.  Sleep habits are as follows: The patient reports that he sleeps every night in bed but takes longer to go to sleep after he goes into the bedroom. He does not have a set bedtime, his bedtime may average 2 AM, always after midnight. His mother describes that he takes Naps throughout the day and evening while he watches TV, and he watches TV in the bedroom as well. With the TV is switched off he seems to wake up. He seems to sleep in intervals of the maximal 2 hour duration, and well into the morning hours. For example he  may sleep from 7 AM to 9 AM. He takes diuretics and has to go to the bathroom frequently. He also has nocturia before he takes his diuretic in the morning. He reports off and on waking up with headaches, headaches wake him up, too. He wakes always exhausted, unrefreshed. His TV seems to be always on..  Sleep medical history and family sleep history:  Brother has CPAP,   Social history: unemployed, father of 25, girlfriend snores. No tobacco use, no ETOH use, Caffeine use; Coffee 2 a day, iced tea, Soda 2 a day.    Review of Systems: Out of a complete 14 system review, the patient complains of only the following symptoms, and all other reviewed systems are negative.   Epworth score 20 , Fatigue severity score 53 , depression score  He lost his son ( 39 )  in a accident. Grieving.    Social History   Social History  . Marital status: Divorced    Spouse name: N/A  . Number of children: 4  . Years of education: N/A   Occupational History  . Unemployed//disabled    Social History Main Topics  . Smoking status: Never Smoker  . Smokeless tobacco: Never Used  . Alcohol use No  . Drug use: No  . Sexual activity: Yes     Comment: per pt's blue health form -  1 sex partner in last 12 months   Other Topics Concern  . Not on file   Social History Narrative   Marital status: single; living with fiance      Children: 4 children, no grandchildren.      Employment:  Disability for learning disabilities since 2012.   Right-handed   Caffeine: occasional          Family History  Problem Relation Age of Onset  . Cancer Father     prostate, colon- age was in his 58's  . Heart disease Father 68    MI   . Hypertension Father   . Diabetes Mother   . Hypertension Mother   . Hypertension Brother   . Cancer Maternal Grandfather   . Cancer Paternal Grandfather     Past Medical History:  Diagnosis Date  . Anxiety   . Depression    Questionable per records. Not on any medication.   Marland Kitchen  Hx of echocardiogram    a. Echo 12/13/12: Mild LVH, EF 67-61%, grade 1 diastolic dysfunction, mild LAE, mild RVE, mild RAE  . Hypertension   . Insomnia   . Migraine   . Noncompliance   . OSA (obstructive sleep apnea)    a. Sleep Study 02/2013:  mod OSA, AHI 33 per hour, O2 sat nadir 85%  . Schizophrenia ([place])    Questionable per records. Not on any medication.     Past Surgical History:  Procedure Laterality Date  . Admission  04/2012   Malignant HTN, HA.  CT head negative, cardiology consult.    Marland Kitchen MANDIBLE SURGERY  1998   metal plate    Current Outpatient Prescriptions  Medication Sig Dispense Refill  . albuterol (PROVENTIL HFA;VENTOLIN HFA) 108 (90 Base) MCG/ACT inhaler Inhale 2 puffs into the lungs every 6 (six) hours as needed for wheezing or shortness of breath. 1 Inhaler 6  . amLODipine (NORVASC) 10 MG tablet TAKE 1 TABLET BY MOUTH DAILY 90 tablet 0  . cloNIDine (CATAPRES) 0.2 MG tablet Take 0.2 mg by mouth.    . hydrochlorothiazide (HYDRODIURIL) 25 MG tablet Take 1 tablet (25 mg total) by mouth daily. 30 tablet 0  . HYDROcodone-acetaminophen (NORCO/VICODIN) 5-325 MG tablet Take 1-2 tablets by mouth every 4 (four) hours as needed for severe pain. 10 tablet 0  . lisinopril (PRINIVIL,ZESTRIL) 40 MG tablet Take 1 tablet (40 mg total) by mouth daily. 90 tablet 0  . potassium chloride (K-DUR) 10 MEQ tablet TAKE 1 TABLET(10 MEQ) BY MOUTH DAILY 30 tablet 0   No current facility-administered medications for this visit.     Allergies as of 05/06/2017 - Review Complete 05/06/2017  Allergen Reaction Noted  . Statins  04/14/2017    Vitals: BP (!) 156/110   Pulse 79   Ht 5\' 9"  (1.753 m)   Wt (!) 305 lb 9.6 oz (138.6 kg)   BMI 45.13 kg/m  Last Weight:  Wt Readings from Last 1 Encounters:  05/06/17 (!) 305 lb 9.6 oz (138.6 kg)   PJK:DTOI mass index is 45.13 kg/m.     Last Height:   Ht Readings from Last 1 Encounters:  05/06/17 5\' 9"  (1.753 m)    Physical exam:  General:  The patient is awake, alert and appears not in acute distress. The patient is well groomed. Head: Normocephalic, atraumatic. Neck is supple. Mallampati 2, no evidence of tonsils . neck circumference:17.75. Nasal airflow patent  TMJ is not evident . Retrognathia is seen.  Cardiovascular:  Regular rate  and rhythm , without  murmurs or carotid bruit, and without distended neck veins. Respiratory: Lungs are clear to auscultation. Skin:  Without evidence of edema, or rash Trunk: BMI is super-obese at 45 . The patient's posture is erect   Neurologic exam : The patient is awake and alert, oriented to place and time. He has a lifelong history of learning disabilities.  Memory subjective described as intact. Attention span & concentration ability appears normal. Speech is fluent,  without dysarthria, dysphonia or aphasia. Mood and affect are appropriate.  Cranial nerves: Pupils are equal and briskly reactive to light. Funduscopic exam without  evidence of pallor or edema. Extraocular movements  in vertical and horizontal planes intact and without nystagmus. Visual fields by finger perimetry are intact.Hearing to finger rub intact.  Facial sensation intact to fine touch. Facial motor strength is symmetric and tongue and uvula move midline. Shoulder shrug was symmetrical.   Motor exam: Normal tone, muscle bulk and symmetric strength in all extremities. Sensory:  Fine touch, pinprick and vibration were tested in all extremities. Proprioception tested in the upper extremities was normal. Coordination: Rapid alternating movements in the fingers/hands was normal. Finger-to-nose maneuver  normal without evidence of ataxia, dysmetria or tremor. Gait and station: Patient walks without assistive device and is able unassisted to climb up to the exam table. Strength within normal limits. Stance is stable and normal.   Deep tendon reflexes: in the  upper and lower extremities are symmetric and intact. Babinski maneuver  response is downgoing.   MichaelFontan's  original sleep study more than 4 years ago took place at [facility] and is not longer existing. He explains that CPAP titration took place but he could not tolerate a full face mask covering mouth and nose, and felt suffocating. He had been instructed that he needs to use CPAP 4 hours each night and could not comply with this requirement due to intolerance-claustrophobia.  Also I have no access to the original sleep study I can see that Mr. Garde has different risk factors for sleep apnea including retrognathia, a larger neck circumference but also morbid obesity his BMI now is over 45 and pulse is a high risk of heart disease, stroke risk and risk of diabetes. Since he learned that he had apnea he has not been treated in any other fashion for it. I will order a split night polysomnography with desensitization, my goal is to have the patient not be apprehensive about the mask and to use something that is as small as he can possibly tolerate without sacrificing efficacy. The patient was just 14 days ago seen for a malignant hypertensive event in the local emergency room. Primary care referred him no for a reevaluation of sleep apnea and treatment.  Assessment:  After physical and neurologic examination, review of laboratory studies,  Personal review of imaging studies, reports of other /same  Imaging studies, results of polysomnography and / or neurophysiology testing and pre-existing records as far as provided in visit., my assessment is ;   1)  OSA , will obtain previous sleep study from [address]   2)  sleep related headaches, headaches interrupt sleep   3)  sleep deprivation with poor sleep hygiene.  4)  morbid obesity, malignant hypertension.    The patient was advised of the nature of the diagnosed disorder , the treatment options and the  risks for general health and wellness  arising from  not treating the condition.   I spent more than 45 minutes of face to face time with the patient.  Greater than 50% of time was spent in counseling and coordination of care. We have discussed the diagnosis and differential and I answered the patient's questions.    Plan:  Treatment plan and additional workup :  SPLIT night polysomnography with desensitization, CO2 measures. I will prescribe a mild sleep aid to allow him to sleep at the usual PSG start time, latest 10 PM.     MD follow up.   Larey Seat, MD 03/01/9493, 4:73 PM  Certified in Neurology by ABPN Certified in [place] by [facility] [address], [place], [place] [address]

## 2017-05-06 NOTE — Addendum Note (Signed)
Addended by: Larey Seat on: 05/06/2017 03:07 PM   Modules accepted: Orders

## 2017-05-08 ENCOUNTER — Ambulatory Visit (INDEPENDENT_AMBULATORY_CARE_PROVIDER_SITE_OTHER): Payer: Medicare HMO | Admitting: Cardiology

## 2017-05-08 VITALS — BP 150/106 | HR 76 | Resp 16 | Ht 69.0 in | Wt 305.0 lb

## 2017-05-08 DIAGNOSIS — G4733 Obstructive sleep apnea (adult) (pediatric): Secondary | ICD-10-CM | POA: Diagnosis not present

## 2017-05-08 DIAGNOSIS — I10 Essential (primary) hypertension: Secondary | ICD-10-CM | POA: Diagnosis not present

## 2017-05-08 DIAGNOSIS — R9431 Abnormal electrocardiogram [ECG] [EKG]: Secondary | ICD-10-CM | POA: Diagnosis not present

## 2017-05-08 MED ORDER — CARVEDILOL 6.25 MG PO TABS: 6.2500 mg | ORAL_TABLET | Freq: Two times a day (BID) | ORAL | 11 refills | Status: DC

## 2017-05-08 MED ORDER — CHLORTHALIDONE 25 MG PO TABS: 25.0000 mg | ORAL_TABLET | Freq: Every day | ORAL | 11 refills | Status: DC

## 2017-05-08 NOTE — Progress Notes (Signed)
Cardiology Office Note    Date:  05/08/2017   ID:  [name], DOB 12/13/1971, MRN [id number]  PCP:  Bartholome Bill, MD  Cardiologist:  Fransico Him, MD   Chief Complaint  Patient presents with  . Hypertension    cant keep under control  . Headache    History of Present Illness:  Michael [name] is a 46 y.o. male who is being seen today for the evaluation of poorly controlled HTN at the request of Leonie Douglas, [place]*.  He was seen in the ER in April for hypertensive emergency and workup was normal including EKG and troponin.  He has mild HAs when BP gets too high.  He denies any history of Chest pain.  He has noticed some DOE with walking a lot.  He denies any palpitations, dizziness (except with migraines), LE edema, PND or orthopnea.  He has been on amlodipine, doxazosin, Lisinopril, HCTZ.  He has had a mildly low potassium in the past.  He is very compliant with his medications.  He uses sea salt when seasoning food while cooking but dose not use table salt.  He occasionally eats fast food. He has a history of OSA and had been on CPAP but has been off of it since 2014 and is getting set up next week for another sleep study by Dr. Toniann Ket.    Past Medical History:  Diagnosis Date  . Anxiety   . Depression    Questionable per records. Not on any medication.   Marland Kitchen Hx of echocardiogram    a. Echo 12/13/12: Mild LVH, EF 92-11%, grade 1 diastolic dysfunction, mild LAE, mild RVE, mild RAE  . Hypertension   . Insomnia   . Migraine   . Noncompliance   . OSA (obstructive sleep apnea)    a. Sleep Study 02/2013:  mod OSA, AHI 33 per hour, O2 sat nadir 85%  . Schizophrenia ([place])    Questionable per records. Not on any medication.     Past Surgical History:  Procedure Laterality Date  . Admission  04/2012   Malignant HTN, HA.  CT head negative, cardiology consult.    Marland Kitchen MANDIBLE SURGERY  1998   metal plate    Current Medications: Current Meds  Medication Sig  . albuterol  (PROVENTIL HFA;VENTOLIN HFA) 108 (90 Base) MCG/ACT inhaler Inhale 2 puffs into the lungs every 6 (six) hours as needed for wheezing or shortness of breath.  . ALPRAZolam (XANAX) 0.5 MG tablet Take 1 tablet (0.5 mg total) by mouth at bedtime as needed for anxiety.  Marland Kitchen amLODipine (NORVASC) 10 MG tablet TAKE 1 TABLET BY MOUTH DAILY  . hydrochlorothiazide (HYDRODIURIL) 25 MG tablet Take 1 tablet (25 mg total) by mouth daily.  Marland Kitchen lisinopril (PRINIVIL,ZESTRIL) 40 MG tablet Take 1 tablet (40 mg total) by mouth daily.  . potassium chloride (K-DUR) 10 MEQ tablet TAKE 1 TABLET(10 MEQ) BY MOUTH DAILY    Allergies:   Statins   Social History   Social History  . Marital status: Divorced    Spouse name: N/A  . Number of children: 4  . Years of education: N/A   Occupational History  . Unemployed//disabled    Social History Main Topics  . Smoking status: Never Smoker  . Smokeless tobacco: Never Used  . Alcohol use No  . Drug use: No  . Sexual activity: Yes     Comment: per pt's blue health form - 1 sex partner in last 12 months   Other  Topics Concern  . Not on file   Social History Narrative   Marital status: single; living with fiance      Children: 4 children, no grandchildren.      Employment:  Disability for learning disabilities since 2012.   Right-handed   Caffeine: occasional           Family History:  The patient's family history includes Cancer in his father, maternal grandfather, and paternal grandfather; Diabetes in his mother; Heart disease (age of onset: 83) in his father; Hypertension in his brother, father, and mother.   ROS:   Please see the history of present illness.    ROS All other systems reviewed and are negative.  No flowsheet data found.     PHYSICAL EXAM:   VS:  BP (!) 150/106   Pulse 76   Resp 16   Ht 5\' 9"  (1.753 m)   Wt (!) 305 lb (138.3 kg)   SpO2 97%   BMI 45.04 kg/m    GEN: Well nourished, well developed, in no acute distress  HEENT: normal    Neck: no JVD, carotid bruits, or masses Cardiac: RRR; no murmurs, rubs, or gallops,no edema.  Intact distal pulses bilaterally.  Respiratory:  clear to auscultation bilaterally, normal work of breathing GI: soft, nontender, nondistended, + BS MS: no deformity or atrophy  Skin: warm and dry, no rash Neuro:  Alert and Oriented x 3, Strength and sensation are intact Psych: euthymic mood, full affect  Wt Readings from Last 3 Encounters:  05/08/17 (!) 305 lb (138.3 kg)  05/06/17 (!) 305 lb 9.6 oz (138.6 kg)  04/04/17 297 lb 6 oz (134.9 kg)      Studies/Labs Reviewed:   EKG:  EKG is not ordered today.  The ekg ordered in ER demonstrated NSR with LVH and T wave abnormality in the inferior and anterolateral leads  Recent Labs: 04/03/2017: ALT 17; B Natriuretic Peptide 20.2; BUN 16; Creatinine, Ser 0.98; Hemoglobin 14.5; Platelets 218; Sodium 138 04/04/2017: Potassium 3.3   Lipid Panel    Component Value Date/Time   CHOL 134 06/13/2015 1118   TRIG 42 06/13/2015 1118   HDL 42 06/13/2015 1118   CHOLHDL 3.2 06/13/2015 1118   VLDL 8 06/13/2015 1118   LDLCALC 84 06/13/2015 1118    Additional studies/ records that were reviewed today include:  Office notes from PCP    ASSESSMENT:    1. Essential hypertension   2. OSA (obstructive sleep apnea)   3. Morbid obesity ([place])   4. Abnormal EKG      PLAN:  In order of problems listed above:  1.  HTN - this has been poorly controlled and was in ER recently with malignant HTN.  He is on multiple meds currently including amlodipine 10mg  daily, HCTZ 25mg  daily, Lisinopril 40mg  daily.  His potassium remains (although on diuretic) despite supplementation so need to consider possible hyperaldo state.  I will get a serum aldo/renin ratio as well as 24 hour urine for catecholamines, dopamine, cortisol, metaneprhines and VMA.  I will also get a CT angio of the abdomen to rule out renal artery stenosis.  I am going to start him on Coreg 6.25mg  BID and  change his HCTZ to chlorthalidone 25mg  daily.  He will followup in HTN clinic in 1 week for uptitration of meds.   2.   OSA - he was on CPAP but stopped in 2014 because his machine was not working.  He is getting set up to have  another sleep study by neurology next week.    3.   Morbid Obesity - I have encouraged him to get into a routine exercise program and cut back on carbs and portions.   4.  Abnormal EKG with diffuse T wave changes - likely related to underlying LVH from poorly controlled HTN but there is a strong family history of premature CAD so I will get a Lexiscan myoview to rule out ischemia.  I would avoid a stress myoview due to need to hold BP meds.  I will also check a 2D echo to assess for LVH.   Medication Adjustments/Labs and Tests Ordered: Current medicines are reviewed at length with the patient today.  Concerns regarding medicines are outlined above.  Medication changes, Labs and Tests ordered today are listed in the Patient Instructions below.  There are no Patient Instructions on file for this visit.   Signed, Fransico Him, MD  05/08/2017 9:01 AM    [facility] [address], [place], [place]  [address] Phone: [phone]; Fax: [phone]

## 2017-05-08 NOTE — Addendum Note (Signed)
Addended by: Eulis Foster on: 05/08/2017 09:56 AM   Modules accepted: Orders

## 2017-05-08 NOTE — Patient Instructions (Signed)
Medication Instructions:  1) STOP HCTZ 2) START CHLORTHALIDONE 25 mg daily 3) START COREG (CARVEDILOL) 6.25 mg TWICE DAILY  Labwork: TODAY: TSH, aldo/renin ratio  24 hour urine.  Testing/Procedures: Your physician has requested that you have an echocardiogram. Echocardiography is a painless test that uses sound waves to create images of your heart. It provides your doctor with information about the size and shape of your heart and how well your heart's chambers and valves are working. This procedure takes approximately one hour. There are no restrictions for this procedure.  Your physician has requested that you have a lexiscan myoview. For further information please visit [email or web address]. Please follow instruction sheet, as given.  Dr. Radford Pax recommends you have an ABDOMINAL CT.  Follow-Up: Your physician recommends that you schedule a follow-up appointment in 1-2 weeks with the HTN CLINIC.  Any Other Special Instructions Will Be Listed Below (If Applicable).     If you need a refill on your cardiac medications before your next appointment, please call your pharmacy.

## 2017-05-11 ENCOUNTER — Encounter: Payer: Self-pay | Admitting: Pharmacist

## 2017-05-11 ENCOUNTER — Ambulatory Visit (INDEPENDENT_AMBULATORY_CARE_PROVIDER_SITE_OTHER): Payer: Medicare HMO | Admitting: Pharmacist

## 2017-05-11 VITALS — BP 154/108 | HR 80

## 2017-05-11 DIAGNOSIS — I10 Essential (primary) hypertension: Secondary | ICD-10-CM | POA: Diagnosis not present

## 2017-05-11 DIAGNOSIS — F321 Major depressive disorder, single episode, moderate: Secondary | ICD-10-CM | POA: Insufficient documentation

## 2017-05-11 DIAGNOSIS — E786 Lipoprotein deficiency: Secondary | ICD-10-CM | POA: Insufficient documentation

## 2017-05-11 NOTE — Progress Notes (Signed)
Patient ID: Michael [name]                 DOB: [birth year]                      MRN: [id number]     HPI: Michael [name] is a 46 y.o. male patient of Dr. Radford Pax who presents today for hypertension evaluation. He was recently seen by Dr. Radford Pax after an ED visit for hypertensive emergency. Work up at that time was normal including EKG and troponins. He has a history of OSA and had been on CPAP but has been off of it since 2014 and is getting set up for another sleep study by Dr. Toniann Ket. Dr. Radford Pax ordered labs for serum aldo/renin ratio as well as 24 hour urine for catecholamines, dopamine, cortisol, metaneprhines and VMA. She also is getting a CT to rule out renal artery stenosis. At this visit his HCTZ was changed to chlorthalidone and he was started on carvedilol 6.25mg  BID.   He presents today with his mother and nephew. He denies dizziness, chest tightness and headaches and states he feels much better overall. He just started carvedilol and chlorthalidone on Friday. He does report being tired quite a bit but this is better than before.    His mother reports that he has been under a decent amount of stress with the loss of his son. She requests a referral for counseling.     Cardiac Hx: HTN, OSA, asthma, hypokalemia  Current HTN meds:  Lisinopril 40mg  daily in the morning Amlodipine 10mg  daily in the morning Chlorthalidone 25mg  daily in the morning  Carvedilol 6.25mg  BID (~8am and ~7pm)  BP goal: <130/80  Family History: The patient's family history includes Cancer in his father, maternal grandfather, and paternal grandfather; Diabetes in his mother; Heart disease (age of onset: 40) in his father; Hypertension in his brother, father, and mother.   Social History: No tobacco products or alcohol.  Diet: Most meals prepared from home. Uses sea salt. One cup of coffee per morning. No soda or tea. Drinks mostly water.   Exercise: No formal exercise.   Home BP readings: Has a wrist cuff has not  checked since medication changes.   Wt Readings from Last 3 Encounters:  05/08/17 (!) 305 lb (138.3 kg)  05/06/17 (!) 305 lb 9.6 oz (138.6 kg)  04/04/17 297 lb 6 oz (134.9 kg)   BP Readings from Last 3 Encounters:  05/11/17 (!) 154/108  05/08/17 (!) 150/106  05/06/17 (!) 156/110   Pulse Readings from Last 3 Encounters:  05/11/17 80  05/08/17 76  05/06/17 79    Renal function: CrCl cannot be calculated (Patient's most recent lab result is older than the maximum 21 days allowed.).  Past Medical History:  Diagnosis Date  . Anxiety   . Depression    Questionable per records. Not on any medication.   Marland Kitchen Hx of echocardiogram    a. Echo 12/13/12: Mild LVH, EF 95-62%, grade 1 diastolic dysfunction, mild LAE, mild RVE, mild RAE  . Hypertension   . Insomnia   . Migraine   . Noncompliance   . OSA (obstructive sleep apnea)    a. Sleep Study 02/2013:  mod OSA, AHI 33 per hour, O2 sat nadir 85%  . Schizophrenia ([place])    Questionable per records. Not on any medication.     Current Outpatient Prescriptions on File Prior to Visit  Medication Sig Dispense Refill  . albuterol (PROVENTIL HFA;VENTOLIN HFA)  108 (90 Base) MCG/ACT inhaler Inhale 2 puffs into the lungs every 6 (six) hours as needed for wheezing or shortness of breath. 1 Inhaler 6  . ALPRAZolam (XANAX) 0.5 MG tablet Take 1 tablet (0.5 mg total) by mouth at bedtime as needed for anxiety. 12 tablet 0  . amLODipine (NORVASC) 10 MG tablet TAKE 1 TABLET BY MOUTH DAILY 90 tablet 0  . chlorthalidone (HYGROTON) 25 MG tablet Take 1 tablet (25 mg total) by mouth daily. 30 tablet 11  . lisinopril (PRINIVIL,ZESTRIL) 40 MG tablet Take 1 tablet (40 mg total) by mouth daily. 90 tablet 0  . potassium chloride (K-DUR) 10 MEQ tablet TAKE 1 TABLET(10 MEQ) BY MOUTH DAILY 30 tablet 0   No current facility-administered medications on file prior to visit.     Allergies  Allergen Reactions  . Statins     Blood pressure (!) 154/108, pulse  80.   Assessment/Plan: Hypertension: BP today is not at goal and essentially unchanged from previous visit. Though it has only been a few days since starting medication I believe his pressure will remain above goal. HR can tolerate increase of BB will increase carvedilol to 12.5mg  BID. Continue all other medications as prescribed. Asked him to monitor pressure and bring log/cuff to follow up. Advised to call if lethargy becomes intolerable or a noticeable difference. Follow up in HTN clinic in 2-3 weeks.   Recommendations for counseling forwarded to Dr. Radford Pax. 05/12/17: Attempted to call pt X3 with Dr. Theodosia Blender recommendation for PCP visit.    Thank you, Lelan Pons. Patterson Hammersmith, [facility]  05/11/2017 9:44 AM

## 2017-05-11 NOTE — Patient Instructions (Addendum)
Return for a follow up appointment in 2-3 weeks  Check your blood pressure at home daily (if able) and keep record of the readings.  Take your BP meds as follows: CONTINUE lisinopril 40mg  daily in the morning Amlodipine 10mg  daily in the morning chlorthalidine 25mg  daily in the morning INCREASE Carvedilol to 12.5mg  (take 2 tablets) twice daily with food   Bring all of your meds, your BP cuff and your record of home blood pressures to your next appointment.  Exercise as you're able, try to walk approximately 30 minutes per day.  Keep salt intake to a minimum, especially watch canned and prepared boxed foods.  Eat more fresh fruits and vegetables and fewer canned items.  Avoid eating in fast food restaurants.    HOW TO TAKE YOUR BLOOD PRESSURE: . Rest 5 minutes before taking your blood pressure. .  Don't smoke or drink caffeinated beverages for at least 30 minutes before. . Take your blood pressure before (not after) you eat. . Sit comfortably with your back supported and both feet on the floor (don't cross your legs). . Elevate your arm to heart level on a table or a desk. . Use the proper sized cuff. It should fit smoothly and snugly around your bare upper arm. There should be enough room to slip a fingertip under the cuff. The bottom edge of the cuff should be 1 inch above the crease of the elbow. . Ideally, take 3 measurements at one sitting and record the average.

## 2017-05-14 LAB — ALDOSTERONE + RENIN ACTIVITY W/ RATIO
ALDOS/RENIN RATIO: 19.7 (ref 0.0–30.0)
ALDOSTERONE: 10.4 ng/dL (ref 0.0–30.0)
Renin: 0.527 ng/mL/hr (ref 0.167–5.380)

## 2017-05-14 LAB — TSH: TSH: 1.01 u[IU]/mL (ref 0.450–4.500)

## 2017-05-16 LAB — METANEPHRINES, URINE, 24 HOUR
Metaneph Total, Ur: 90 ug/L
Metanephrines, 24H Ur: 207 ug/24 hr (ref 45–290)
Normetanephrine, 24H Ur: 998 ug/24 hr — ABNORMAL HIGH (ref 82–500)
Normetanephrine, Ur: 434 ug/L

## 2017-05-16 LAB — CATECHOLAMINES, FRACTIONATED, URINE, 24 HOUR
DOPAMINE RANDOM UR: 120 ug/L
Dopamine , 24H Ur: 276 ug/24 hr (ref 0–510)
EPINEPHRINE 24H UR: 5 ug/(24.h) (ref 0–20)
EPINEPHRINE RAND UR: 2 ug/L
NOREPINEPH RAND UR: 38 ug/L
NOREPINEPHRINE 24H UR: 87 ug/(24.h) (ref 0–135)

## 2017-05-17 LAB — 5 HIAA, QUANTITATIVE, URINE, 24 HOUR
5 HIAA UR: 2.2 mg/L
5-HIAA,QUANT.,24 HR URINE: 4.4 mg/(24.h) (ref 0.0–14.9)

## 2017-05-19 ENCOUNTER — Telehealth (HOSPITAL_COMMUNITY): Payer: Self-pay | Admitting: *Deleted

## 2017-05-19 NOTE — Telephone Encounter (Signed)
Patient given detailed instructions per Myocardial Perfusion Study Information Sheet for the test on 05/21/17 at 1230. Patient notified to arrive 15 minutes early and that it is imperative to arrive on time for appointment to keep from having the test rescheduled.  If you need to cancel or reschedule your appointment, please call the office within 24 hours of your appointment. . Patient verbalized understanding.Michael [name], [place]anae [place]

## 2017-05-21 ENCOUNTER — Ambulatory Visit (HOSPITAL_COMMUNITY): Payer: Medicare HMO | Attending: Cardiovascular Disease

## 2017-05-21 ENCOUNTER — Other Ambulatory Visit: Payer: Self-pay

## 2017-05-21 ENCOUNTER — Ambulatory Visit (HOSPITAL_BASED_OUTPATIENT_CLINIC_OR_DEPARTMENT_OTHER): Payer: Medicare HMO

## 2017-05-21 DIAGNOSIS — R9431 Abnormal electrocardiogram [ECG] [EKG]: Secondary | ICD-10-CM | POA: Diagnosis not present

## 2017-05-21 DIAGNOSIS — I10 Essential (primary) hypertension: Secondary | ICD-10-CM | POA: Diagnosis not present

## 2017-05-21 LAB — ECHOCARDIOGRAM COMPLETE
Height: 69 in
Weight: 4880 oz

## 2017-05-21 IMAGING — NM NM MISC PROCEDURE
8 series · 48 of 48 positions shown · non-contrast
Comparison: none

[Series 1: wbr_s-proj_st stress_(id)_sa · 6.5mm · 6.51mm/px · 6 of 512 frames shown (1 of 2)]
[frame 43/512]
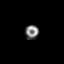
[frame 128/512]
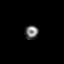
[frame 214/512]
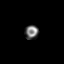
[frame 299/512]
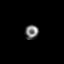
[frame 384/512]
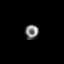
[frame 470/512]
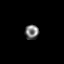

[Series 1: wbr_r-proj_st rest_(id)_sa · 6.5mm · 6.51mm/px · 6 of 64 frames shown]
[frame 6/64]
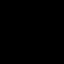
[frame 16/64]
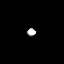
[frame 27/64]
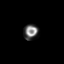
[frame 38/64]
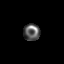
[frame 48/64]
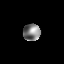
[frame 59/64]
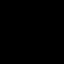

[Series 1: wbr_s-proj_st stress · 6.51mm/px · 6 of 64 frames shown (1 of 2)]
[frame 6/64]
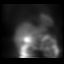
[frame 16/64]
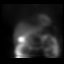
[frame 27/64]
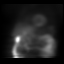
[frame 38/64]
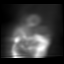
[frame 48/64]
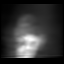
[frame 59/64]
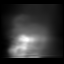

[Series 1: wbr_s-proj_st stress_(id)_sa · 6.5mm · 6.51mm/px · 6 of 64 frames shown (2 of 2)]
[frame 6/64]
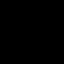
[frame 16/64]
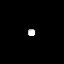
[frame 27/64]
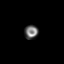
[frame 38/64]
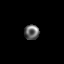
[frame 48/64]
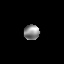
[frame 59/64]
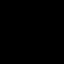

[Series 1: wbr_s-proj_st stress · 6.51mm/px · 6 of 512 frames shown (2 of 2)]
[frame 43/512]
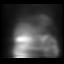
[frame 128/512]
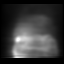
[frame 214/512]
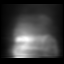
[frame 299/512]
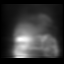
[frame 384/512]
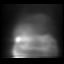
[frame 470/512]
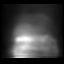

[Series 1: stress · 6.51mm/px · 6 of 506 frames shown (1 of 2)]
[frame 43/506]
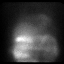
[frame 127/506]
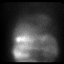
[frame 211/506]
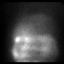
[frame 296/506]
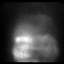
[frame 380/506]
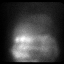
[frame 464/506]
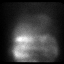

[Series 1: stress · 6.51mm/px · 6 of 64 frames shown (2 of 2)]
[frame 6/64]
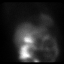
[frame 16/64]
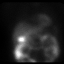
[frame 27/64]
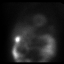
[frame 38/64]
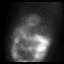
[frame 48/64]
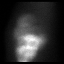
[frame 59/64]
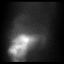

[Series 2: wbr_r-proj_st rest · 6.51mm/px · 6 of 64 frames shown]
[frame 6/64]
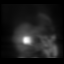
[frame 16/64]
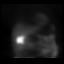
[frame 27/64]
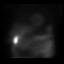
[frame 38/64]
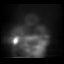
[frame 48/64]
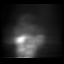
[frame 59/64]
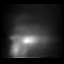

[48 of 48 positions shown; findings below may reference images not displayed]

Canned report from images found in remote index.

Refer to host system for actual result text.

## 2017-05-21 MED ORDER — TECHNETIUM TC 99M TETROFOSMIN IV KIT
33.0000 | PACK | Freq: Once | INTRAVENOUS | Status: AC | PRN
  Administered 2017-05-21: 33 via INTRAVENOUS
  Filled 2017-05-21: qty 33

## 2017-05-21 MED ORDER — REGADENOSON 0.4 MG/5ML IV SOLN
0.4000 mg | Freq: Once | INTRAVENOUS | Status: AC
  Administered 2017-05-21: 0.4 mg via INTRAVENOUS

## 2017-05-21 MED ORDER — PERFLUTREN LIPID MICROSPHERE
1.0000 mL | INTRAVENOUS | Status: AC | PRN
  Administered 2017-05-21: 2 mL via INTRAVENOUS

## 2017-05-22 ENCOUNTER — Ambulatory Visit (INDEPENDENT_AMBULATORY_CARE_PROVIDER_SITE_OTHER)
Admission: RE | Admit: 2017-05-22 | Discharge: 2017-05-22 | Disposition: A | Discharge status: Home / Self Care | Payer: Medicare HMO | Source: Ambulatory Visit | Attending: Cardiology | Admitting: Cardiology

## 2017-05-22 ENCOUNTER — Ambulatory Visit (HOSPITAL_COMMUNITY): Payer: Medicare HMO | Attending: Cardiology

## 2017-05-22 DIAGNOSIS — I10 Essential (primary) hypertension: Secondary | ICD-10-CM | POA: Diagnosis not present

## 2017-05-22 LAB — MYOCARDIAL PERFUSION IMAGING
CHL CUP NUCLEAR SSS: 8
CHL CUP RESTING HR STRESS: 75 {beats}/min
CSEPPHR: 93 {beats}/min
LV sys vol: 72 mL
LVDIAVOL: 155 mL (ref 62–150)
NUC STRESS TID: 0.94
RATE: 0.26
SDS: 4
SRS: 4

## 2017-05-22 IMAGING — CT CT ANGIO ABDOMEN
3 of 10 series · 11 of 46 positions shown, 17 images · IV contrast (ISOVUE 370)
Comparison: None.

CLINICAL DATA: Uncontrolled hypertension.

EXAM:
CT ANGIOGRAPHY ABDOMEN
TECHNIQUE: Multidetector CT imaging of the abdomen was performed using the
standard protocol during bolus administration of intravenous
contrast. Multiplanar reconstructed images and MIPs were obtained
and reviewed to evaluate the vascular anatomy.
CONTRAST:  100 mL of Isovue 370 intravenously.

[Series 4: arterial 3.0 i40f 2 · axial · arterial · 0.88mm/px · z∈[-294,-231]mm · 3 of 94 slices shown]
[im 8/94  soft-tissue]
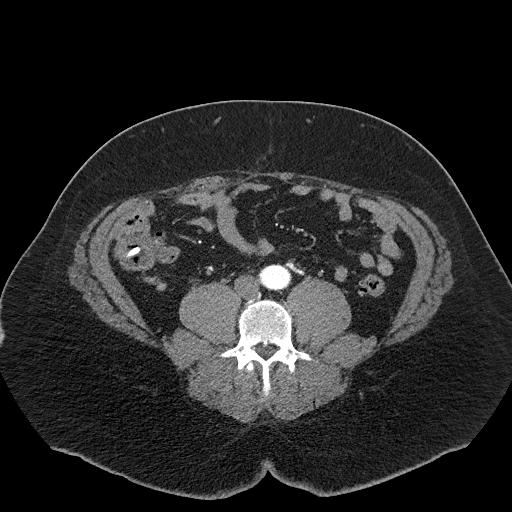
[im 22/94  soft-tissue]
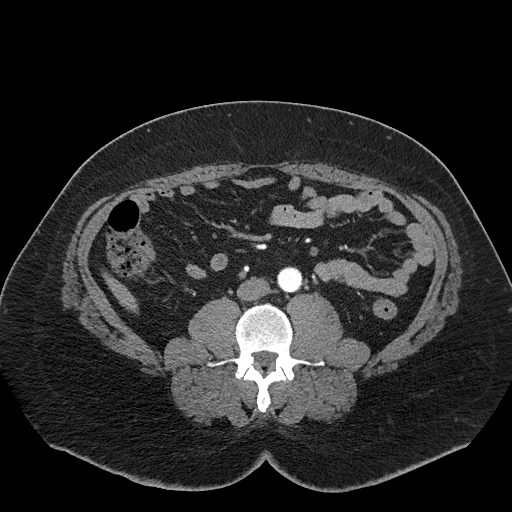
[im 29/94  soft-tissue]
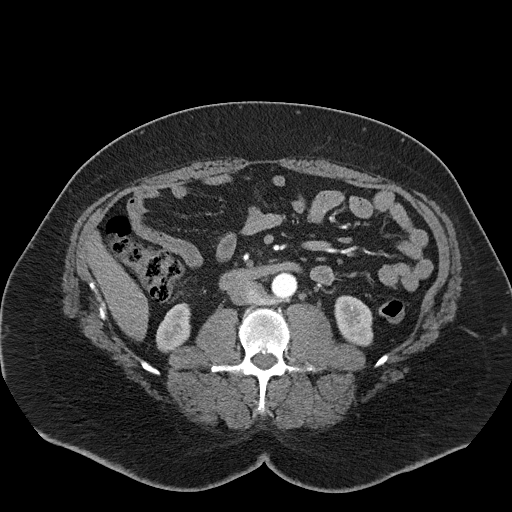

[Series 7: venous 5.0 i40f 2 · axial · portal-venous · 0.88mm/px · z∈[-278,-78]mm · 6 of 56 slices shown, 11 images]
[im 8/56  soft-tissue]
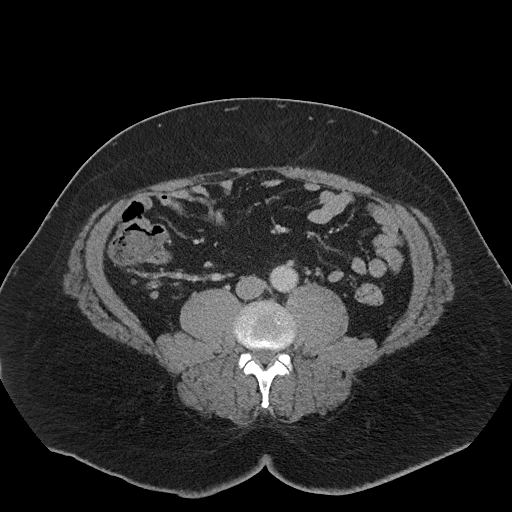
[im 8/56  bone]
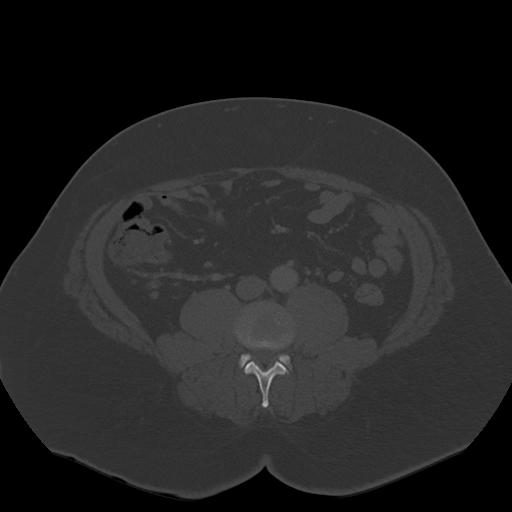
[im 16/56  soft-tissue]
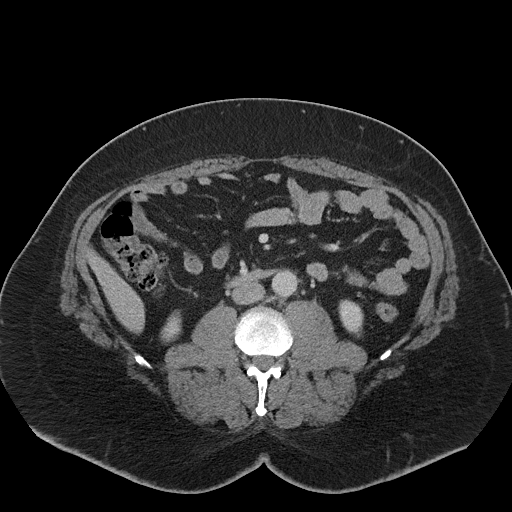
[im 24/56  soft-tissue]
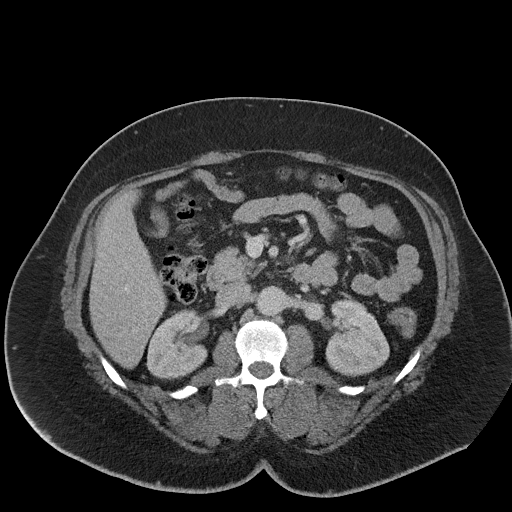
[im 24/56  lung]
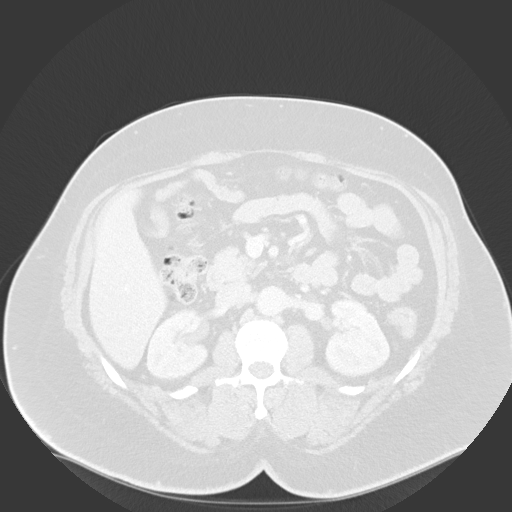
[im 32/56  soft-tissue]
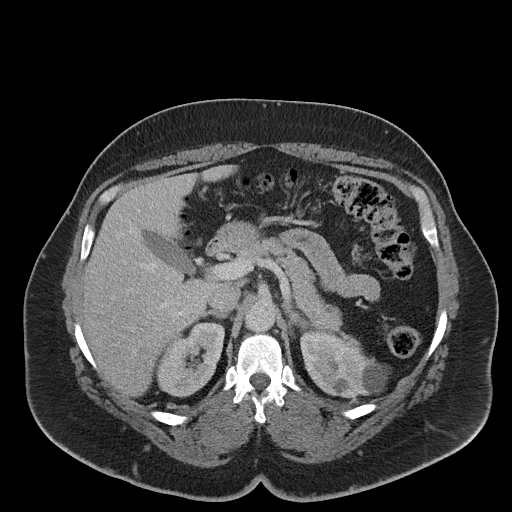
[im 32/56  lung]
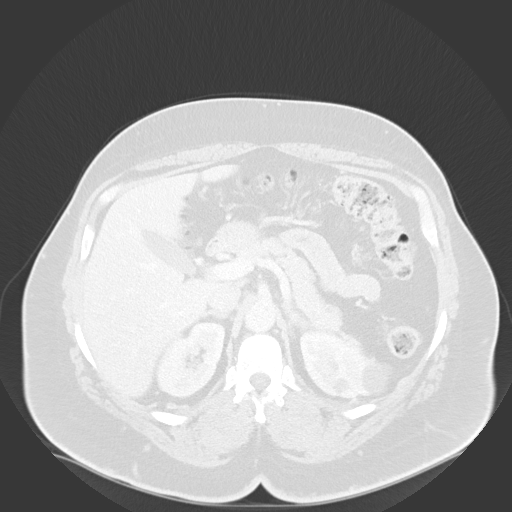
[im 40/56  soft-tissue]
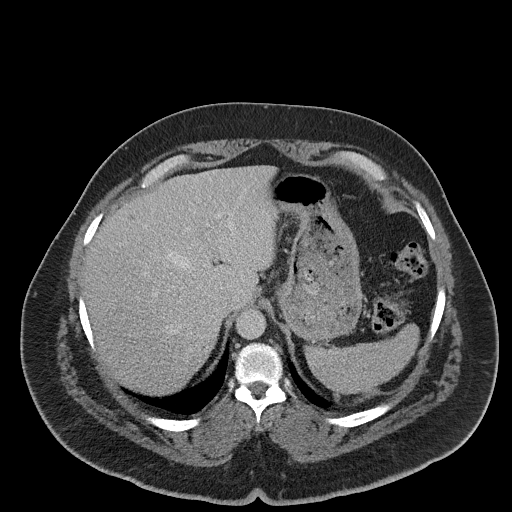
[im 40/56  lung]
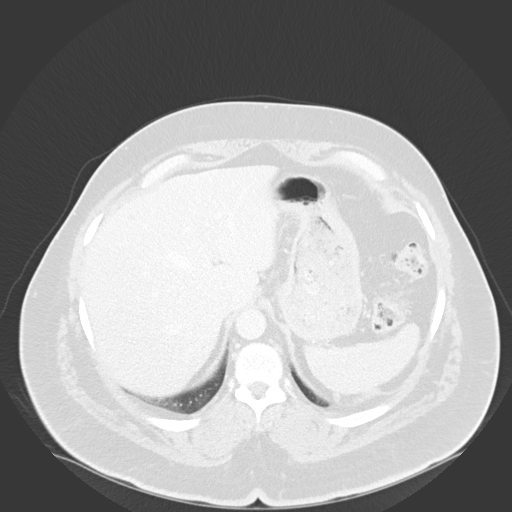
[im 48/56  soft-tissue]
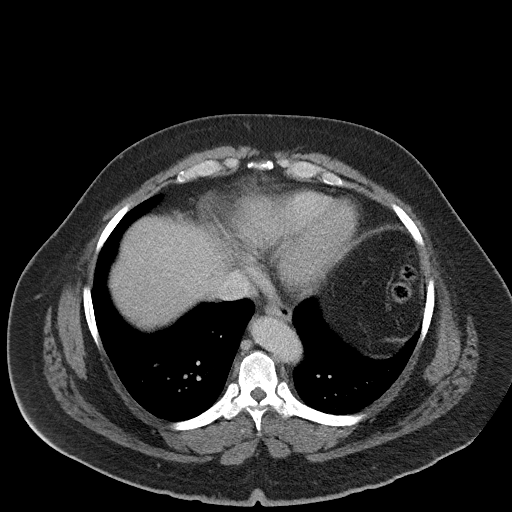
[im 48/56  lung]
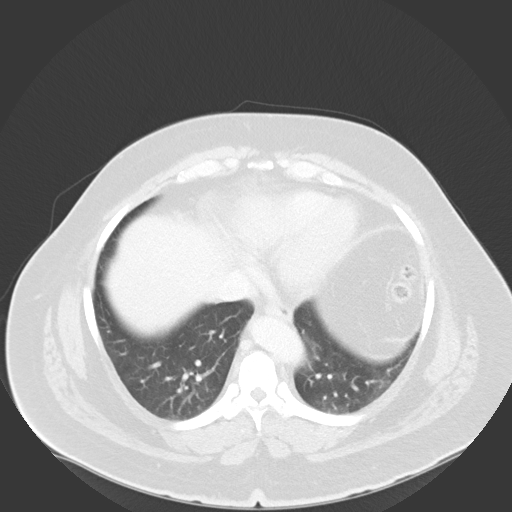

[Series 10: coronals · coronal · 0.57mm/px · 2 of 142 slices shown, 3 images]
[im 48/142  soft-tissue]
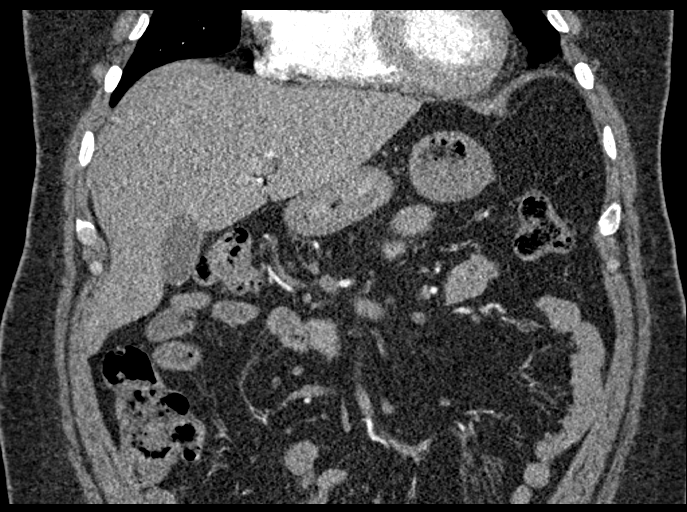
[im 48/142  bone]
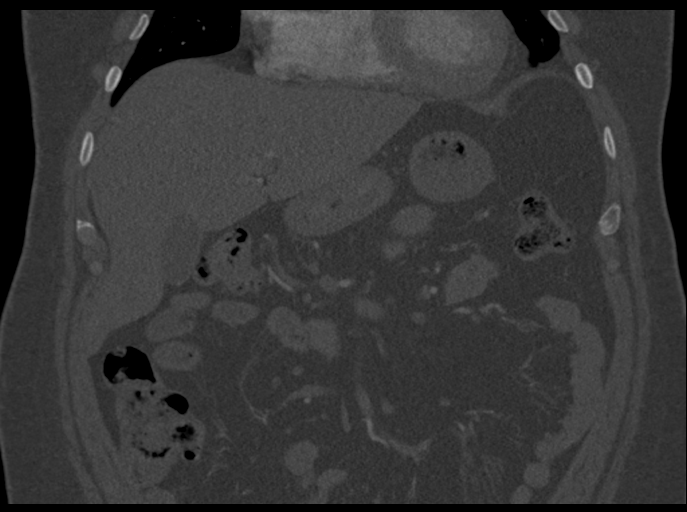
[im 95/142  soft-tissue]
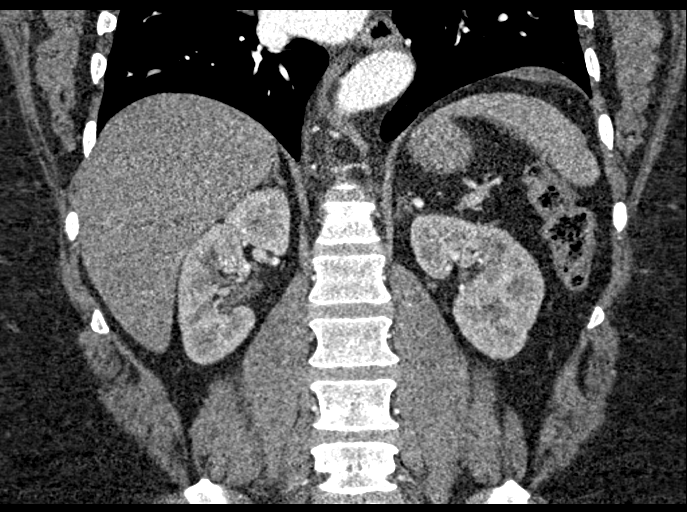

[11 of 46 positions shown; findings below may reference images not displayed]

FINDINGS: VASCULAR

Aorta: Normal caliber aorta without aneurysm, dissection, vasculitis
or significant stenosis.

Celiac: Patent without evidence of aneurysm, dissection, vasculitis
or significant stenosis.

SMA: Patent without evidence of aneurysm, dissection, vasculitis or
significant stenosis.

Renals: Both renal arteries are patent without evidence of aneurysm,
dissection, vasculitis, fibromuscular dysplasia or significant
stenosis.

IMA: Patent without evidence of aneurysm, dissection, vasculitis or
significant stenosis.

Veins: No obvious venous abnormality within the limitations of this
arterial phase study.

Review of the MIP images confirms the above findings.

NON-VASCULAR

Lower chest: No acute abnormality.

Hepatobiliary: No gallstones are noted. Probable hemangioma is noted
peripherally in right hepatic lobe.

Pancreas: Unremarkable. No pancreatic ductal dilatation or
surrounding inflammatory changes.

Spleen: Normal in size without focal abnormality.

Adrenals/Urinary Tract: Adrenal glands appear normal. Left renal
cyst is noted. No hydronephrosis or renal calculi is noted.

Stomach/Bowel: The appendix appears normal. Visualized bowel loops
appear normal without evidence of obstruction. No inflammation is
noted.

Lymphatic: No significant adenopathy is noted.

Other: No abnormal fluid collection or hernia is noted.

Musculoskeletal: No acute or significant osseous findings.
IMPRESSION: VASCULAR

No evidence of abdominal aortic aneurysm or dissection.

No evidence of significant renal or mesenteric artery stenosis.

NON-VASCULAR

Probable hemangioma seen in right hepatic lobe.

No other abnormality seen in the abdomen.

## 2017-05-22 MED ORDER — TECHNETIUM TC 99M TETROFOSMIN IV KIT
32.6000 | PACK | Freq: Once | INTRAVENOUS | Status: AC | PRN
  Administered 2017-05-22: 32.6 via INTRAVENOUS
  Filled 2017-05-22: qty 33

## 2017-05-22 MED ORDER — IOPAMIDOL (ISOVUE-370) INJECTION 76%
100.0000 mL | Freq: Once | INTRAVENOUS | Status: AC | PRN
  Administered 2017-05-22: 100 mL via INTRAVENOUS

## 2017-05-27 ENCOUNTER — Telehealth: Payer: Self-pay | Admitting: Neurology

## 2017-05-27 DIAGNOSIS — G4733 Obstructive sleep apnea (adult) (pediatric): Secondary | ICD-10-CM

## 2017-05-27 NOTE — Telephone Encounter (Signed)
autotitration 5-20 cm water.

## 2017-05-27 NOTE — Telephone Encounter (Signed)
I called pt. I advised pt that his insurance denied his sleep study since he has already had a sleep study and has been diagnosed with sleep apnea. Dr. Brett Fairy recommends that pt start an autopap. I reviewed PAP compliance expectations with the pt. Pt is agreeable to starting a CPAP. I advised pt that an order will be sent to a DME, [facility], and [facility] will call the pt within about one week after they file with the pt's insurance. [facility] will show the pt how to use the machine, fit for masks, and troubleshoot the CPAP if needed. A follow up appt was made for insurance purposes with Dr. Brett Fairy on Monday, September 17th, 2018 at 10:30am Pt verbalized understanding to arrive 15 minutes early and bring their CPAP. A letter with all of this information in it will be mailed to the pt as a reminder. Pt informed me that his has moved to a new address, [address], [place], [place] [address]. Pt verbalized understanding of results. Pt had no questions at this time but was encouraged to call back if questions arise.

## 2017-05-27 NOTE — Telephone Encounter (Signed)
[employer] denied Sp[employer] and suggested Autopap because the patient already has OSA diagnosis.  They wouldn't approve a HST

## 2017-05-28 NOTE — Telephone Encounter (Signed)
Received this notice from [facility]: "This patient has a [employer] HMO plan and we are out of network with this plan.  We are working on a contract but have not completed it yet so it will be in his best interest to go with an in network provider."  Will send to [facility].

## 2017-05-29 ENCOUNTER — Ambulatory Visit: Payer: Medicare HMO | Admitting: Pharmacist

## 2017-05-29 NOTE — Progress Notes (Deleted)
Patient ID: Michael [name]                 DOB: [birth year]                      MRN: [id number]     HPI: Michael [name] is a 46 y.o. male referred by Dr. Radford Pax to HTN clinic. He was recently seen by Dr. Radford Pax on 05/08/17 after an ED visit for hypertensive emergency in 03/2017. Work up at that time was normal including EKG and troponins. Labs for serum aldosterone/renin ratio, 24 hr urine catecholamines, dopamine, cortisol, metanephrines, and VMA were all insignificant in 04/2017. Abdominal CT on 05/22/17 was negative for renal artery stenosis. PMH is significant for HTN and OSA. His insurance denied another sleep study since he has already been diagnosed with OSA, but seems to be in the process of getting a new CPAP machine.   He presents today ***. At his last pharmacy appointment, his carvedilol was increase to 12.5 mg bid. He *** been tolerating this dose increase, *** dizziness, chest tightness, and headaches. Overall, he states he feels ***.   ***  Cardiac Hx: HTN, OSA, asthma, hypokalemia  Current HTN meds:  Lisinopril 40mg  daily in the morning Amlodipine 10mg  daily in the morning Chlorthalidone 25mg  daily in the morning  Carvedilol 12.5mg  BID (~8am and ~7pm)  BP goal: < 130/80  Family History: The patient's family history includes Cancer in his father, maternal grandfather, and paternal grandfather; Diabetes in his mother; Heart disease (age of onset: 38) in his father; Hypertension in his brother, father, and mother.  Social History: No tobacco products or alcohol.  Diet: Most meals prepared from home. Uses sea salt. One cup of coffee per morning. No soda or tea. Drinks mostly water.   Exercise: No formal exercise.   Home BP readings: Has a wrist cuff has not checked since medication changes.   Wt Readings from Last 3 Encounters:  05/21/17 (!) 305 lb (138.3 kg)  05/08/17 (!) 305 lb (138.3 kg)  05/06/17 (!) 305 lb 9.6 oz (138.6 kg)   BP Readings from Last 3 Encounters:  05/11/17  (!) 154/108  05/08/17 (!) 150/106  05/06/17 (!) 156/110   Pulse Readings from Last 3 Encounters:  05/11/17 80  05/08/17 76  05/06/17 79    Renal function: CrCl cannot be calculated (Patient's most recent lab result is older than the maximum 21 days allowed.).  Past Medical History:  Diagnosis Date  . Anxiety   . Depression    Questionable per records. Not on any medication.   Marland Kitchen Hx of echocardiogram    a. Echo 12/13/12: Mild LVH, EF 21-30%, grade 1 diastolic dysfunction, mild LAE, mild RVE, mild RAE  . Hypertension   . Insomnia   . Migraine   . Noncompliance   . OSA (obstructive sleep apnea)    a. Sleep Study 02/2013:  mod OSA, AHI 33 per hour, O2 sat nadir 85%  . Schizophrenia ([facility])    Questionable per records. Not on any medication.     Current Outpatient Prescriptions on File Prior to Visit  Medication Sig Dispense Refill  . albuterol (PROVENTIL HFA;VENTOLIN HFA) 108 (90 Base) MCG/ACT inhaler Inhale 2 puffs into the lungs every 6 (six) hours as needed for wheezing or shortness of breath. 1 Inhaler 6  . ALPRAZolam (XANAX) 0.5 MG tablet Take 1 tablet (0.5 mg total) by mouth at bedtime as needed for anxiety. 12 tablet 0  . amLODipine (NORVASC)  10 MG tablet TAKE 1 TABLET BY MOUTH DAILY 90 tablet 0  . carvedilol (COREG) 6.25 MG tablet Take 2 tablets (12.5 mg total) by mouth 2 (two) times daily. 60 tablet 11  . chlorthalidone (HYGROTON) 25 MG tablet Take 1 tablet (25 mg total) by mouth daily. 30 tablet 11  . lisinopril (PRINIVIL,ZESTRIL) 40 MG tablet Take 1 tablet (40 mg total) by mouth daily. 90 tablet 0  . potassium chloride (K-DUR) 10 MEQ tablet TAKE 1 TABLET(10 MEQ) BY MOUTH DAILY 30 tablet 0   No current facility-administered medications on file prior to visit.     Allergies  Allergen Reactions  . Statins      Assessment/Plan:  1. Hypertension - BP today is *** goal of < 130/80. ***. Patient advised to ***. Follow-up in HTN clinic in *** weeks.   Belia Heman,  PharmD PGY1 Pharmacy Resident 05/29/2017 11:05 AM   Patient seen with: Fuller Canada, PharmD, CPP, [facility] [address], [place], [place] [address] Phone: [phone]; Fax: [phone]

## 2017-06-24 ENCOUNTER — Ambulatory Visit (HOSPITAL_COMMUNITY): Payer: Medicare HMO | Admitting: Clinical

## 2017-07-01 ENCOUNTER — Other Ambulatory Visit: Payer: Self-pay | Admitting: Neurology

## 2017-07-01 DIAGNOSIS — G4724 Circadian rhythm sleep disorder, free running type: Secondary | ICD-10-CM

## 2017-07-14 ENCOUNTER — Encounter: Payer: Self-pay | Admitting: Neurology

## 2017-08-13 ENCOUNTER — Ambulatory Visit: Payer: Medicare HMO | Admitting: Internal Medicine

## 2017-08-13 ENCOUNTER — Other Ambulatory Visit: Payer: Self-pay | Admitting: Neurology

## 2017-08-13 DIAGNOSIS — G4724 Circadian rhythm sleep disorder, free running type: Secondary | ICD-10-CM

## 2017-09-14 ENCOUNTER — Ambulatory Visit: Payer: Self-pay | Admitting: Neurology

## 2017-09-15 ENCOUNTER — Encounter: Payer: Self-pay | Admitting: Neurology

## 2018-06-11 ENCOUNTER — Other Ambulatory Visit: Payer: Self-pay

## 2018-06-11 ENCOUNTER — Emergency Department (HOSPITAL_COMMUNITY)
Admission: EM | Admit: 2018-06-11 | Discharge: 2018-06-12 | Disposition: A | Discharge status: Home / Self Care | Payer: Medicare Other | Attending: Emergency Medicine | Admitting: Emergency Medicine

## 2018-06-11 ENCOUNTER — Encounter (HOSPITAL_COMMUNITY): Payer: Self-pay | Admitting: *Deleted

## 2018-06-11 DIAGNOSIS — Z5321 Procedure and treatment not carried out due to patient leaving prior to being seen by health care provider: Secondary | ICD-10-CM | POA: Diagnosis not present

## 2018-06-11 DIAGNOSIS — R51 Headache: Secondary | ICD-10-CM | POA: Diagnosis not present

## 2018-06-11 NOTE — ED Notes (Signed)
Patient is leaving to "take some tylenol."

## 2018-06-11 NOTE — ED Triage Notes (Signed)
Pt arrived by EMS for a headache and hypertension that has been ongoing throughout the day. Pt took tylenol and his bp medication without improvement of headache. Pt also endorses nausea. Initial bp pt got at home was 258/190.

## 2018-06-14 NOTE — ED Notes (Signed)
Follow up call made  No answer  06/14/18 1135  s Rashema Seawright rn

## 2018-10-01 DIAGNOSIS — G479 Sleep disorder, unspecified: Secondary | ICD-10-CM | POA: Insufficient documentation

## 2019-01-13 DIAGNOSIS — N201 Calculus of ureter: Secondary | ICD-10-CM | POA: Insufficient documentation

## 2019-01-13 DIAGNOSIS — F329 Major depressive disorder, single episode, unspecified: Secondary | ICD-10-CM | POA: Insufficient documentation

## 2019-01-13 DIAGNOSIS — N289 Disorder of kidney and ureter, unspecified: Secondary | ICD-10-CM | POA: Diagnosis not present

## 2019-01-13 DIAGNOSIS — F419 Anxiety disorder, unspecified: Secondary | ICD-10-CM | POA: Insufficient documentation

## 2019-01-13 DIAGNOSIS — I10 Essential (primary) hypertension: Secondary | ICD-10-CM | POA: Diagnosis not present

## 2019-01-13 DIAGNOSIS — R1032 Left lower quadrant pain: Secondary | ICD-10-CM | POA: Diagnosis present

## 2019-01-13 DIAGNOSIS — Z79899 Other long term (current) drug therapy: Secondary | ICD-10-CM | POA: Insufficient documentation

## 2019-01-14 ENCOUNTER — Other Ambulatory Visit: Payer: Self-pay

## 2019-01-14 ENCOUNTER — Encounter (HOSPITAL_COMMUNITY): Payer: Self-pay

## 2019-01-14 ENCOUNTER — Emergency Department (HOSPITAL_COMMUNITY): Payer: Medicare Other

## 2019-01-14 ENCOUNTER — Emergency Department (HOSPITAL_COMMUNITY)
Admission: EM | Admit: 2019-01-14 | Discharge: 2019-01-14 | Disposition: A | Discharge status: Home / Self Care | Payer: Medicare Other | Attending: Emergency Medicine | Admitting: Emergency Medicine

## 2019-01-14 DIAGNOSIS — N289 Disorder of kidney and ureter, unspecified: Secondary | ICD-10-CM

## 2019-01-14 DIAGNOSIS — N201 Calculus of ureter: Secondary | ICD-10-CM

## 2019-01-14 LAB — URINALYSIS, ROUTINE W REFLEX MICROSCOPIC
Bilirubin Urine: NEGATIVE
Glucose, UA: NEGATIVE mg/dL
Ketones, ur: NEGATIVE mg/dL
Leukocytes, UA: NEGATIVE
Nitrite: NEGATIVE
PROTEIN: NEGATIVE mg/dL
Specific Gravity, Urine: 1.014 (ref 1.005–1.030)
pH: 5 (ref 5.0–8.0)

## 2019-01-14 IMAGING — CT CT RENAL STONE PROTOCOL
2 of 4 series · 16 of 46 positions shown, 18 images · non-contrast
Comparison: CT of the abdomen dated [DATE]

CLINICAL DATA: 47-year-old male with left flank pain.

EXAM:
CT ABDOMEN AND PELVIS WITHOUT CONTRAST
TECHNIQUE: Multidetector CT imaging of the abdomen and pelvis was performed
following the standard protocol without IV contrast.

[Series 3: axial st · axial · 0.80mm/px · z∈[+1198,+1618]mm · 13 of 94 slices shown, 15 images]
[im 5/94  soft-tissue]
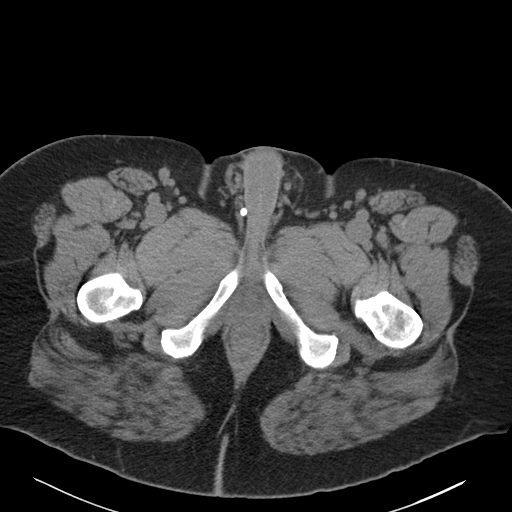
[im 5/94  bone]
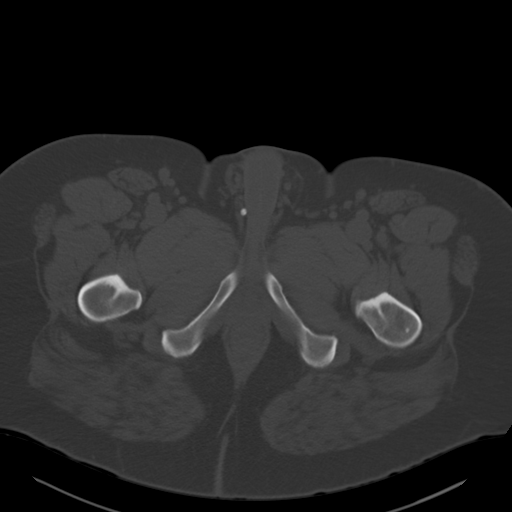
[im 13/94  soft-tissue]
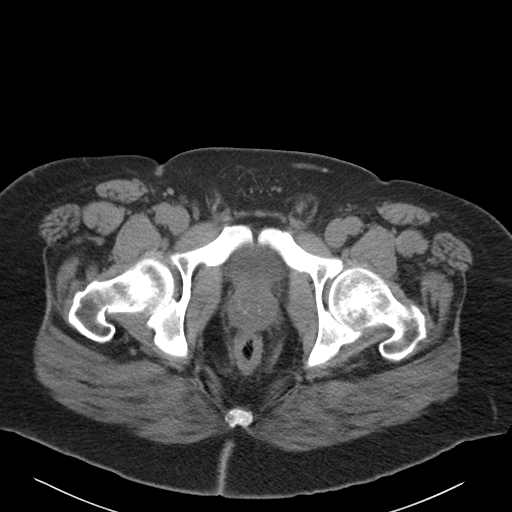
[im 22/94  soft-tissue]
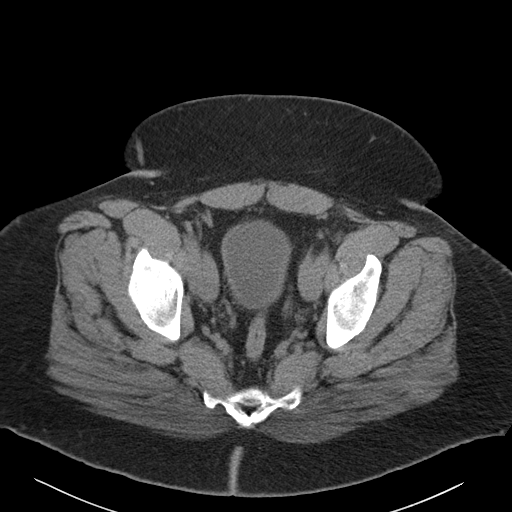
[im 26/94  soft-tissue]
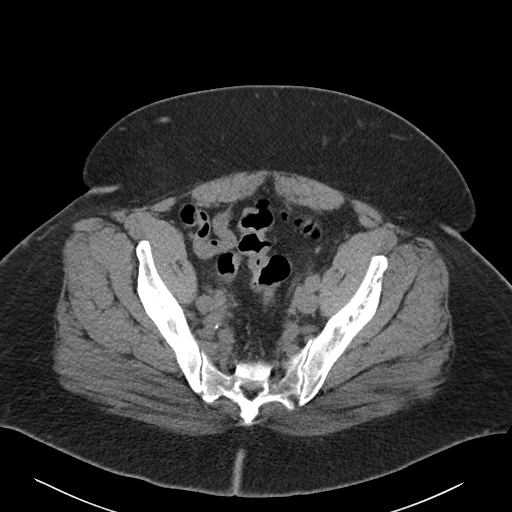
[im 34/94  soft-tissue]
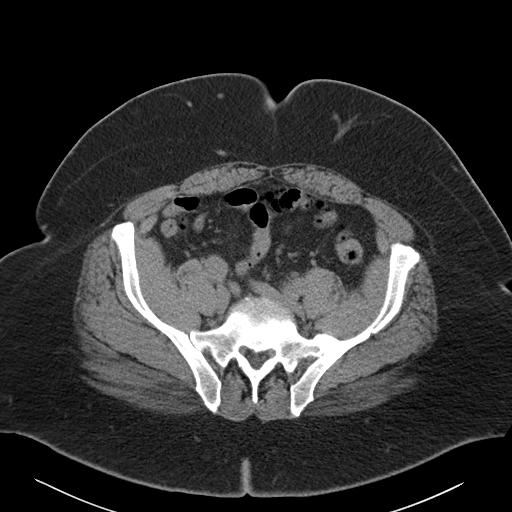
[im 39/94  soft-tissue]
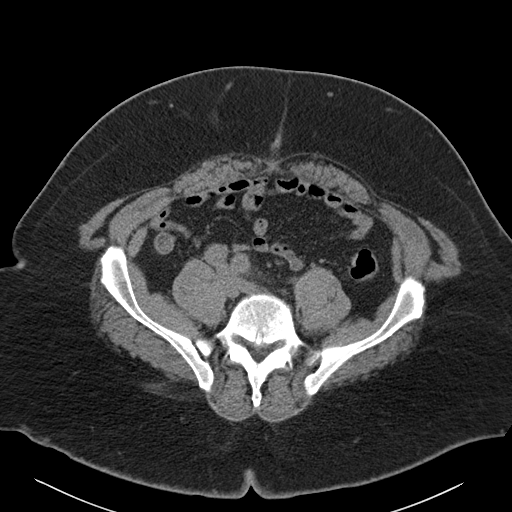
[im 47/94  soft-tissue]
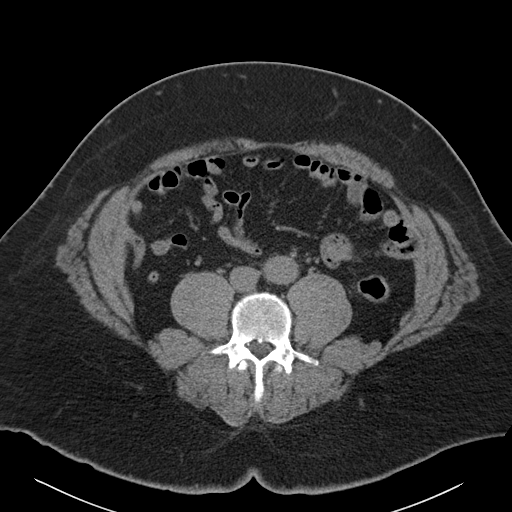
[im 55/94  soft-tissue]
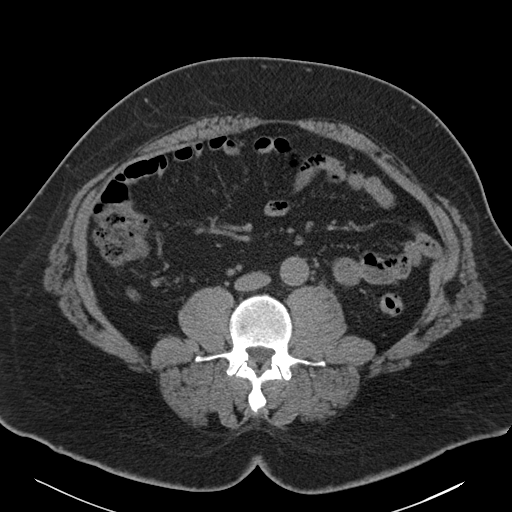
[im 60/94  soft-tissue]
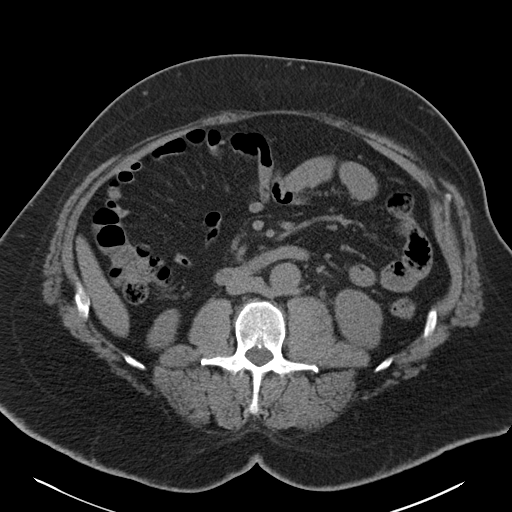
[im 60/94  bone]
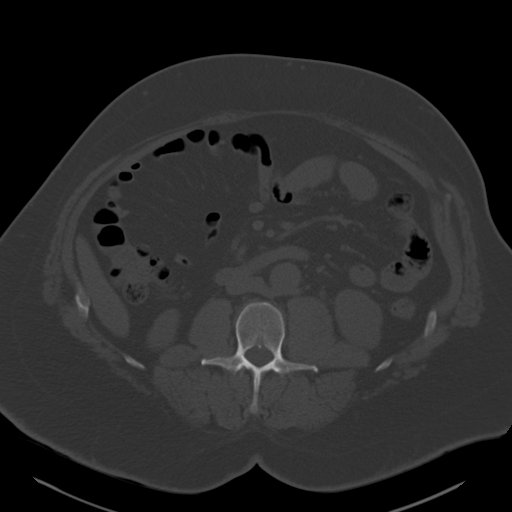
[im 68/94  soft-tissue]
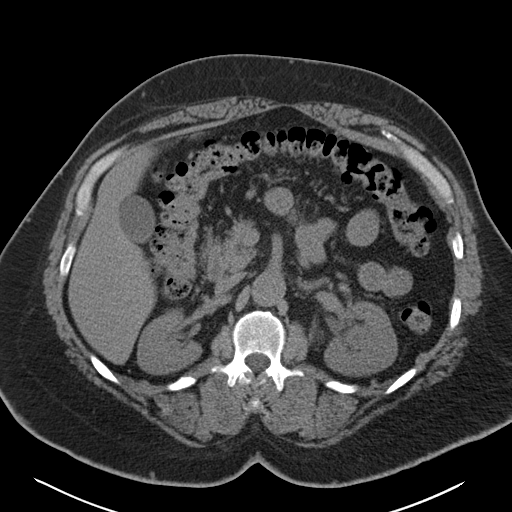
[im 72/94  soft-tissue]
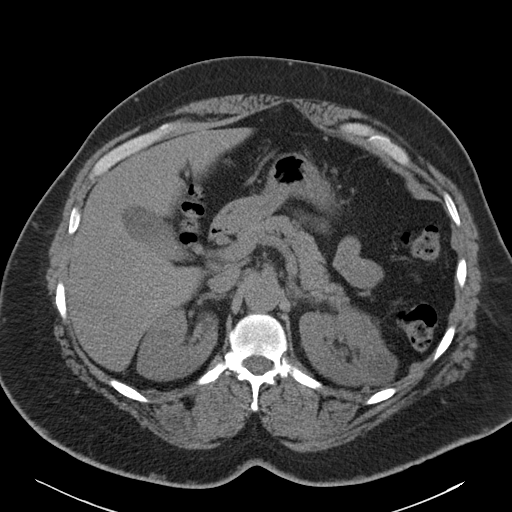
[im 81/94  soft-tissue]
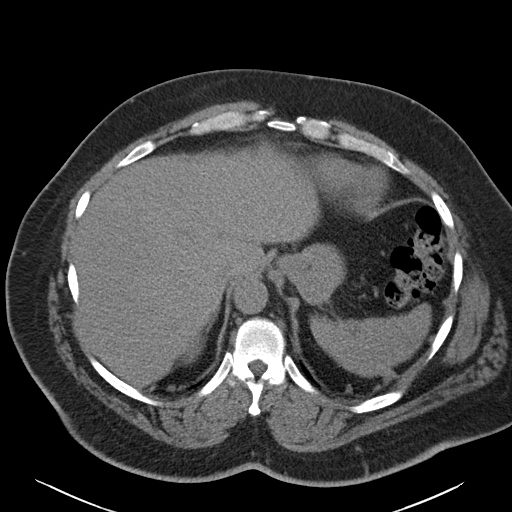
[im 89/94  soft-tissue]
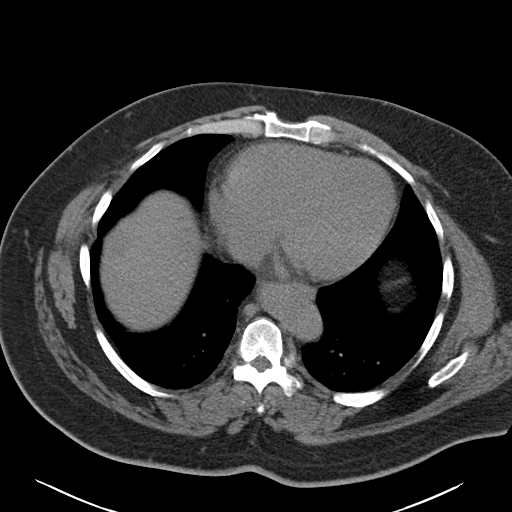

[Series 5: coronal · coronal · 0.79mm/px · 3 of 155 slices shown]
[im 52/155  soft-tissue]
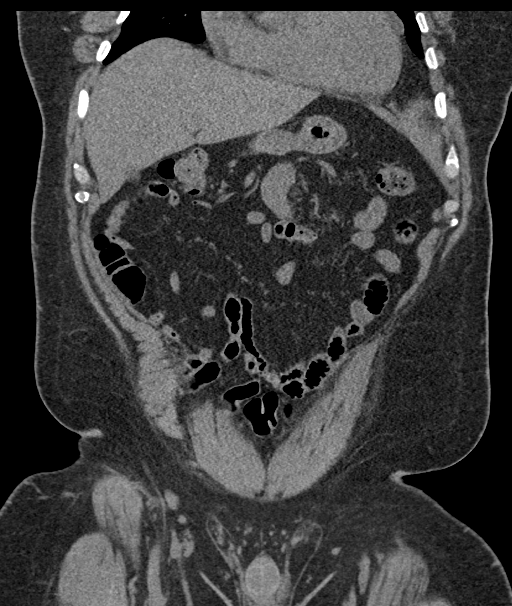
[im 69/155  soft-tissue]
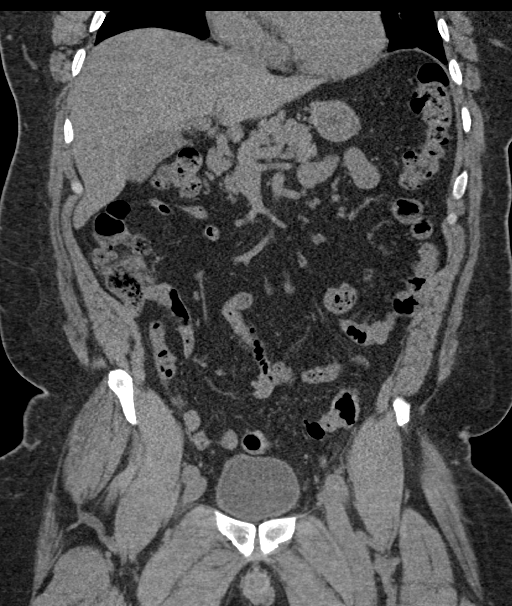
[im 86/155  soft-tissue]
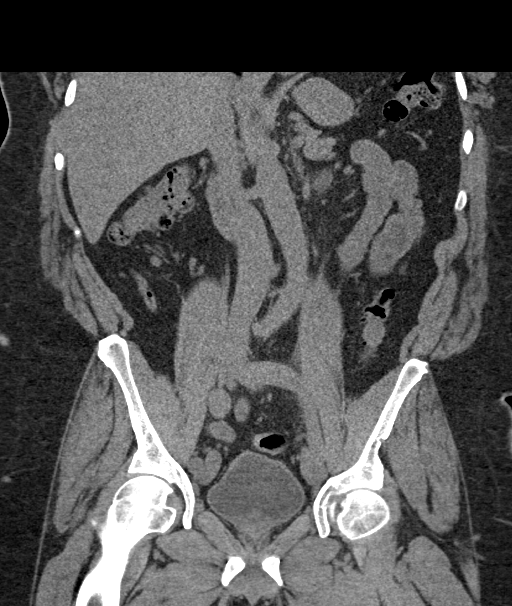

[16 of 46 positions shown; findings below may reference images not displayed]

FINDINGS: Evaluation of this exam is limited in the absence of intravenous
contrast.

Lower chest: The visualized lung bases are clear.

No intra-abdominal free air or free fluid.

Hepatobiliary: There is mild fatty infiltration of the liver. There
is a 2.4 x 3.2 cm hypodense lesion in the right lobe of the liver
which is not well characterized but appeared to demonstrate
peripheral nodular enhancement on the prior CT and likely represents
a hemangioma. Further characterization with MRI on a nonemergent
basis recommended. Additional 1 cm hypodense lesion in the left lobe
of the liver inferiorly is not characterized but likely represents a
cyst. No intrahepatic biliary ductal dilatation. The gallbladder is
unremarkable.

Pancreas: Unremarkable. No pancreatic ductal dilatation or
surrounding inflammatory changes.

Spleen: Normal in size without focal abnormality.

Adrenals/Urinary Tract: The adrenal glands are unremarkable.
Bilateral renal hypodense lesions are not well characterized. The
largest lesion in the upper pole of the left kidney measures 4 cm
and demonstrates a fluid attenuation most consistent with a cyst.
Ultrasound may provide better characterisation of the renal lesions.
There is no hydronephrosis on either side. There is a 6 mm stone in
the distal left ureter. The visualized right ureter appear
unremarkable. The urinary bladder is grossly unremarkable as
visualized.

Stomach/Bowel: There is no bowel obstruction or active inflammation.
The appendix is normal.

Vascular/Lymphatic: The abdominal aorta and IVC are grossly
unremarkable on this noncontrast CT. No portal venous gas. There is
no adenopathy.

Reproductive: The prostate and seminal vesicles are grossly
unremarkable.

Other: None

Musculoskeletal: Degenerative changes primarily at L5-S1. No acute
osseous pathology.
IMPRESSION: 1. A 6 mm distal left ureteral stone. No hydronephrosis.
2. Bilateral renal hypodense lesions likely cysts. Ultrasound may
provide better characterisation of the renal lesions.
3. Hypodense lesion in the right lobe of the liver likely a
hemangioma. Further characterization with MRI on a nonemergent basis
recommended.
4. No bowel obstruction or active inflammation. Normal appendix.

## 2019-01-14 MED ORDER — OXYCODONE-ACETAMINOPHEN 5-325 MG PO TABS
1.0000 | ORAL_TABLET | Freq: Once | ORAL | Status: AC
  Administered 2019-01-14: 1 via ORAL
  Filled 2019-01-14: qty 1

## 2019-01-14 MED ORDER — OXYCODONE-ACETAMINOPHEN 5-325 MG PO TABS: 1.0000 | ORAL_TABLET | ORAL | 0 refills | Status: DC | PRN

## 2019-01-14 MED ORDER — TAMSULOSIN HCL 0.4 MG PO CAPS
0.4000 mg | ORAL_CAPSULE | Freq: Once | ORAL | Status: AC
  Administered 2019-01-14: 0.4 mg via ORAL
  Filled 2019-01-14: qty 1

## 2019-01-14 MED ORDER — TAMSULOSIN HCL 0.4 MG PO CAPS: ORAL_CAPSULE | ORAL | 0 refills | Status: DC

## 2019-01-14 NOTE — ED Provider Notes (Signed)
[facility] DEPT Provider Note: Georgena Spurling, MD, FACEP  CSN: [id number] MRN: [id number] ARRIVAL: 01/13/19 at 2211 ROOM: [place]  Flank Pain   HISTORY OF PRESENT ILLNESS  01/14/19 1:56 AM Michael [name] is a 48 y.o. male with a 2-week history of pain in his left flank radiating to his left lower quadrant.  The onset has been gradual and it recently worsened.  He rates the pain as a 9.5 out of 10, worse when he walks.  He is also having burning with urination but denies hematuria or urethral discharge.  He denies fever or vomiting but did have nausea yesterday.   Past Medical History:  Diagnosis Date  . Anxiety   . Depression    Questionable per records. Not on any medication.   Marland Kitchen Hx of echocardiogram    a. Echo 12/13/12: Mild LVH, EF 60-63%, grade 1 diastolic dysfunction, mild LAE, mild RVE, mild RAE  . Hypertension   . Insomnia   . Migraine   . Noncompliance   . OSA (obstructive sleep apnea)    a. Sleep Study 02/2013:  mod OSA, AHI 33 per hour, O2 sat nadir 85%    Past Surgical History:  Procedure Laterality Date  . Admission  04/2012   Malignant HTN, HA.  CT head negative, cardiology consult.    Marland Kitchen MANDIBLE SURGERY  1998   metal plate    Family History  Problem Relation Age of Onset  . Cancer Father        prostate, colon- age was in his 65's  . Heart disease Father 48       MI   . Hypertension Father   . Diabetes Mother   . Hypertension Mother   . Hypertension Brother   . Cancer Maternal Grandfather   . Cancer Paternal Grandfather     Social History   Tobacco Use  . Smoking status: Never Smoker  . Smokeless tobacco: Never Used  Substance Use Topics  . Alcohol use: No    Alcohol/week: 0.0 standard drinks  . Drug use: No    Prior to Admission medications   Medication Sig Start Date End Date Taking? Authorizing Provider  amLODipine (NORVASC) 10 MG tablet TAKE 1 TABLET BY MOUTH DAILY 04/03/17  Yes Tenna Delaine D, PA-C  buPROPion  (WELLBUTRIN XL) 150 MG 24 hr tablet Take 150 mg by mouth daily.   Yes [provider]  carvedilol (COREG) 6.25 MG tablet Take 6.25 mg by mouth 2 (two) times daily.  05/11/17  Yes Turner, Eber Hong, MD  chlorthalidone (HYGROTON) 25 MG tablet Take 1 tablet (25 mg total) by mouth daily. 05/08/17 01/14/19 Yes Turner, Eber Hong, MD  lisinopril (PRINIVIL,ZESTRIL) 40 MG tablet Take 1 tablet (40 mg total) by mouth daily. Patient taking differently: Take 50 mg by mouth daily.  04/03/17  Yes Timmothy Euler, [place] D, PA-C  potassium chloride (K-DUR) 10 MEQ tablet TAKE 1 TABLET(10 MEQ) BY MOUTH DAILY Patient taking differently: Take 10 mEq by mouth daily. TAKE 1 TABLET(10 MEQ) BY MOUTH DAILY 12/23/16  Yes [name], MD  albuterol (PROVENTIL HFA;VENTOLIN HFA) 108 (90 Base) MCG/ACT inhaler Inhale 2 puffs into the lungs every 6 (six) hours as needed for wheezing or shortness of breath. Patient not taking: Reported on 01/14/2019 02/13/16   Chesley Mires, MD  ALPRAZolam Duanne Moron) 0.5 MG tablet TAKE 1 TABLET BY MOUTH AT BEDTIME AS NEEDED FOR ANXIETY Patient not taking: Reported on 01/14/2019 07/02/17   Dohmeier, Asencion Partridge, MD  Allergies Statins   REVIEW OF SYSTEMS  Negative except as noted here or in the History of Present Illness.   PHYSICAL EXAMINATION  Initial Vital Signs Blood pressure (!) 153/109, pulse 89, temperature 98.6 F (37 C), temperature source Oral, resp. rate 18, height 5\' 9"  (1.753 m), weight 132.5 kg, SpO2 99 %.  Examination General: Well-developed, well-nourished male in no acute distress; appearance consistent with age of record HENT: normocephalic; atraumatic Eyes: pupils equal, round and reactive to light; extraocular muscles intact Neck: supple Heart: regular rate and rhythm Lungs: clear to auscultation bilaterally Abdomen: soft; nondistended; left lower quadrant tenderness; bowel sounds present GU: Left CVA tenderness Extremities: No deformity; full range of motion; pulses  normal Neurologic: Awake, alert and oriented; motor function intact in all extremities and symmetric; no facial droop Skin: Warm and dry Psychiatric: Normal mood and affect   RESULTS  Summary of this visit's results, reviewed by myself:   EKG Interpretation  Date/Time:    Ventricular Rate:    PR Interval:    QRS Duration:   QT Interval:    QTC Calculation:   R Axis:     Text Interpretation:        Laboratory Studies: Results for orders placed or performed during the hospital encounter of 01/14/19 (from the past 24 hour(s))  Urinalysis, Routine w reflex microscopic- may I&O cath if menses     Status: Abnormal   Collection Time: 01/14/19  1:17 AM  Result Value Ref Range   Color, Urine YELLOW YELLOW   APPearance CLEAR CLEAR   Specific Gravity, Urine 1.014 1.005 - 1.030   pH 5.0 5.0 - 8.0   Glucose, UA NEGATIVE NEGATIVE mg/dL   Hgb urine dipstick SMALL (A) NEGATIVE   Bilirubin Urine NEGATIVE NEGATIVE   Ketones, ur NEGATIVE NEGATIVE mg/dL   Protein, ur NEGATIVE NEGATIVE mg/dL   Nitrite NEGATIVE NEGATIVE   Leukocytes, UA NEGATIVE NEGATIVE   RBC / HPF 6-10 0 - 5 RBC/hpf   WBC, UA 0-5 0 - 5 WBC/hpf   Bacteria, UA RARE (A) NONE SEEN   Squamous Epithelial / LPF 0-5 0 - 5   Mucus PRESENT    Imaging Studies: Ct Renal Stone Study  Result Date: 01/14/2019 CLINICAL DATA:  48 year old male with left flank pain. EXAM: CT ABDOMEN AND PELVIS WITHOUT CONTRAST TECHNIQUE: Multidetector CT imaging of the abdomen and pelvis was performed following the standard protocol without IV contrast. COMPARISON:  CT of the abdomen dated 05/22/2017 FINDINGS: Evaluation of this exam is limited in the absence of intravenous contrast. Lower chest: The visualized lung bases are clear. No intra-abdominal free air or free fluid. Hepatobiliary: There is mild fatty infiltration of the liver. There is a 2.4 x 3.2 cm hypodense lesion in the right lobe of the liver which is not well characterized but appeared to  demonstrate peripheral nodular enhancement on the prior CT and likely represents a hemangioma. Further characterization with MRI on a nonemergent basis recommended. Additional 1 cm hypodense lesion in the left lobe of the liver inferiorly is not characterized but likely represents a cyst. No intrahepatic biliary ductal dilatation. The gallbladder is unremarkable. Pancreas: Unremarkable. No pancreatic ductal dilatation or surrounding inflammatory changes. Spleen: Normal in size without focal abnormality. Adrenals/Urinary Tract: The adrenal glands are unremarkable. Bilateral renal hypodense lesions are not well characterized. The largest lesion in the upper pole of the left kidney measures 4 cm and demonstrates a fluid attenuation most consistent with a cyst. Ultrasound may provide better characterisation of  the renal lesions. There is no hydronephrosis on either side. There is a 6 mm stone in the distal left ureter. The visualized right ureter appear unremarkable. The urinary bladder is grossly unremarkable as visualized. Stomach/Bowel: There is no bowel obstruction or active inflammation. The appendix is normal. Vascular/Lymphatic: The abdominal aorta and IVC are grossly unremarkable on this noncontrast CT. No portal venous gas. There is no adenopathy. Reproductive: The prostate and seminal vesicles are grossly unremarkable. Other: None Musculoskeletal: Degenerative changes primarily at L5-S1. No acute osseous pathology. IMPRESSION: 1. A 6 mm distal left ureteral stone. No hydronephrosis. 2. Bilateral renal hypodense lesions likely cysts. Ultrasound may provide better characterisation of the renal lesions. 3. Hypodense lesion in the right lobe of the liver likely a hemangioma. Further characterization with MRI on a nonemergent basis recommended. 4. No bowel obstruction or active inflammation. Normal appendix. Electronically Signed   By: Anner Crete M.D.   On: 01/14/2019 03:19    ED COURSE and MDM  Nursing  notes and initial vitals signs, including pulse oximetry, reviewed.  Vitals:   01/13/19 2214  BP: (!) 153/109  Pulse: 89  Resp: 18  Temp: 98.6 F (37 C)  TempSrc: Oral  SpO2: 99%  Weight: 132.5 kg  Height: 5\' 9"  (1.753 m)   Patient and family advised of CT findings showing 6 mm left ureteral stone as well as kidney lesions likely cysts.  We will refer to urology for further valuation and treatment.  PROCEDURES    ED DIAGNOSES     ICD-10-CM   1. Ureterolithiasis N20.1   2. Kidney lesion N28.9        Matei Magnone, Jenny Reichmann, MD 01/14/19 [id number]

## 2019-01-14 NOTE — ED Triage Notes (Addendum)
Pt reports L sided flank pain that radiates down to his L groin x 2 weeks. Pt also states that is burns to urinate. Denies any fever or vomiting. Endorses nausea earlier today. Reports that he last urinated around 530p today. A&Ox4.

## 2019-01-17 ENCOUNTER — Other Ambulatory Visit: Payer: Self-pay | Admitting: Urology

## 2019-01-20 ENCOUNTER — Encounter (HOSPITAL_BASED_OUTPATIENT_CLINIC_OR_DEPARTMENT_OTHER): Payer: Self-pay | Admitting: *Deleted

## 2019-01-20 ENCOUNTER — Other Ambulatory Visit: Payer: Self-pay

## 2019-01-20 NOTE — Progress Notes (Signed)
Spoke with patient via telephone for pre op interview. Pt may have clear liquids until 0930 DOS. Patient verbalized understanding of clear liquid diet and no milk products. Patient to take Coreg, Norvasc, Wellbutrin and Hygroton AM of surgery. Arrival time 1330. Will need BMET and EKG.

## 2019-01-23 NOTE — Anesthesia Preprocedure Evaluation (Addendum)
Anesthesia Evaluation  Patient identified by MRN, date of birth, ID band Patient awake    Reviewed: Allergy & Precautions, H&P , NPO status , Patient's Chart, lab work & pertinent test results  Airway Mallampati: II  TM Distance: >3 FB Neck ROM: Full    Dental no notable dental hx. (+) Loose, Dental Advisory Given,    Pulmonary asthma , sleep apnea ,    Pulmonary exam normal breath sounds clear to auscultation       Cardiovascular hypertension, Pt. on medications and Pt. on home beta blockers Normal cardiovascular exam Rhythm:Regular Rate:Normal  01/24/2019 NSR w nsst changes r73   Neuro/Psych  Headaches, PSYCHIATRIC DISORDERS Depression    GI/Hepatic negative GI ROS, Neg liver ROS,   Endo/Other  Morbid obesityK+3.3  Renal/GU negative Renal ROS     Musculoskeletal negative musculoskeletal ROS (+)   Abdominal (+) + obese,   Peds  Hematology   Anesthesia Other Findings   Reproductive/Obstetrics                           Lab Results  Component Value Date   CREATININE 0.98 04/03/2017   BUN 16 04/03/2017   NA 138 04/03/2017   K 3.3 (L) 04/04/2017   CL 101 04/03/2017   CO2 29 04/03/2017    Lab Results  Component Value Date   WBC 5.4 04/03/2017   HGB 14.5 04/03/2017   HCT 41.6 04/03/2017   MCV 83.4 04/03/2017   PLT 218 04/03/2017    Anesthesia Physical Anesthesia Plan  ASA: III  Anesthesia Plan: General   Post-op Pain Management:    Induction: Intravenous  PONV Risk Score and Plan: 3 and Treatment may vary due to age or medical condition, Ondansetron, Dexamethasone and Midazolam  Airway Management Planned: LMA  Additional Equipment:   Intra-op Plan:   Post-operative Plan:   Informed Consent: I have reviewed the patients History and Physical, chart, labs and discussed the procedure including the risks, benefits and alternatives for the proposed anesthesia with the patient  or authorized representative who has indicated his/her understanding and acceptance.     Dental advisory given  Plan Discussed with: CRNA  Anesthesia Plan Comments: (Check EKG)        Anesthesia Quick Evaluation

## 2019-01-24 ENCOUNTER — Ambulatory Visit (HOSPITAL_BASED_OUTPATIENT_CLINIC_OR_DEPARTMENT_OTHER): Payer: Medicare Other | Admitting: Anesthesiology

## 2019-01-24 ENCOUNTER — Ambulatory Visit (HOSPITAL_BASED_OUTPATIENT_CLINIC_OR_DEPARTMENT_OTHER)
Admission: RE | Admit: 2019-01-24 | Discharge: 2019-01-24 | Disposition: A | Discharge status: Home / Self Care | Payer: Medicare Other | Attending: Urology | Admitting: Urology

## 2019-01-24 ENCOUNTER — Encounter (HOSPITAL_BASED_OUTPATIENT_CLINIC_OR_DEPARTMENT_OTHER): Payer: Self-pay | Admitting: Anesthesiology

## 2019-01-24 ENCOUNTER — Other Ambulatory Visit: Payer: Self-pay

## 2019-01-24 ENCOUNTER — Encounter (HOSPITAL_BASED_OUTPATIENT_CLINIC_OR_DEPARTMENT_OTHER)
Admission: RE | Disposition: A | Discharge status: Home / Self Care | Payer: Self-pay | Source: Home / Self Care | Attending: Urology

## 2019-01-24 DIAGNOSIS — Z6841 Body Mass Index (BMI) 40.0 and over, adult: Secondary | ICD-10-CM | POA: Insufficient documentation

## 2019-01-24 DIAGNOSIS — G4733 Obstructive sleep apnea (adult) (pediatric): Secondary | ICD-10-CM | POA: Diagnosis not present

## 2019-01-24 DIAGNOSIS — N132 Hydronephrosis with renal and ureteral calculous obstruction: Secondary | ICD-10-CM | POA: Insufficient documentation

## 2019-01-24 DIAGNOSIS — R109 Unspecified abdominal pain: Secondary | ICD-10-CM | POA: Diagnosis present

## 2019-01-24 DIAGNOSIS — I10 Essential (primary) hypertension: Secondary | ICD-10-CM | POA: Diagnosis not present

## 2019-01-24 HISTORY — PX: CYSTOSCOPY WITH RETROGRADE PYELOGRAM, URETEROSCOPY AND STENT PLACEMENT: SHX5789

## 2019-01-24 HISTORY — PX: HOLMIUM LASER APPLICATION: SHX5852

## 2019-01-24 HISTORY — DX: Personal history of urinary calculi: Z87.442

## 2019-01-24 LAB — CBC
HEMATOCRIT: 40.2 % (ref 39.0–52.0)
Hemoglobin: 12.8 g/dL — ABNORMAL LOW (ref 13.0–17.0)
MCH: 28.5 pg (ref 26.0–34.0)
MCHC: 31.8 g/dL (ref 30.0–36.0)
MCV: 89.5 fL (ref 80.0–100.0)
Platelets: 199 10*3/uL (ref 150–400)
RBC: 4.49 MIL/uL (ref 4.22–5.81)
RDW: 14.9 % (ref 11.5–15.5)
WBC: 5.7 10*3/uL (ref 4.0–10.5)
nRBC: 0 % (ref 0.0–0.2)

## 2019-01-24 LAB — BASIC METABOLIC PANEL
Anion gap: 6 (ref 5–15)
BUN: 16 mg/dL (ref 6–20)
CALCIUM: 8.3 mg/dL — AB (ref 8.9–10.3)
CO2: 27 mmol/L (ref 22–32)
Chloride: 106 mmol/L (ref 98–111)
Creatinine, Ser: 0.98 mg/dL (ref 0.61–1.24)
GFR calc Af Amer: >60 mL/min (ref >60)
Glucose, Bld: 94 mg/dL (ref 70–99)
Potassium: 3.2 mmol/L — ABNORMAL LOW (ref 3.5–5.1)
Sodium: 139 mmol/L (ref 135–145)

## 2019-01-24 SURGERY — CYSTOURETEROSCOPY, WITH RETROGRADE PYELOGRAM AND STENT INSERTION
Anesthesia: General | Site: Urethra | Laterality: Left

## 2019-01-24 MED ORDER — DEXAMETHASONE SODIUM PHOSPHATE 4 MG/ML IJ SOLN
INTRAMUSCULAR | Status: DC | PRN
  Administered 2019-01-24: 8 mg via INTRAVENOUS

## 2019-01-24 MED ORDER — MIDAZOLAM HCL 5 MG/5ML IJ SOLN
INTRAMUSCULAR | Status: DC | PRN
  Administered 2019-01-24: 2 mg via INTRAVENOUS

## 2019-01-24 MED ORDER — OXYCODONE-ACETAMINOPHEN 5-325 MG PO TABS: 1.0000 | ORAL_TABLET | ORAL | 0 refills | Status: DC | PRN

## 2019-01-24 MED ORDER — SODIUM CHLORIDE 0.9 % IR SOLN
Status: DC | PRN
  Administered 2019-01-24: 3000 mL

## 2019-01-24 MED ORDER — CEFAZOLIN SODIUM-DEXTROSE 2-4 GM/100ML-% IV SOLN
2.0000 g | INTRAVENOUS | Status: AC
  Administered 2019-01-24: 2 g via INTRAVENOUS
  Administered 2019-01-24: 1 g via INTRAVENOUS
  Filled 2019-01-24: qty 100

## 2019-01-24 MED ORDER — LIDOCAINE 2% (20 MG/ML) 5 ML SYRINGE
INTRAMUSCULAR | Status: DC | PRN
  Administered 2019-01-24: 100 mg via INTRAVENOUS

## 2019-01-24 MED ORDER — ONDANSETRON HCL 4 MG/2ML IJ SOLN
INTRAMUSCULAR | Status: DC | PRN
  Administered 2019-01-24: 4 mg via INTRAVENOUS

## 2019-01-24 MED ORDER — LACTATED RINGERS IV SOLN
INTRAVENOUS | Status: DC
  Administered 2019-01-24: 17:00:00 via INTRAVENOUS
  Administered 2019-01-24: 50 mL/h via INTRAVENOUS
  Filled 2019-01-24: qty 1000

## 2019-01-24 MED ORDER — FENTANYL CITRATE (PF) 100 MCG/2ML IJ SOLN
INTRAMUSCULAR | Status: DC | PRN
  Administered 2019-01-24 (×2): 50 ug via INTRAVENOUS

## 2019-01-24 MED ORDER — KETOROLAC TROMETHAMINE 30 MG/ML IJ SOLN
INTRAMUSCULAR | Status: DC | PRN
  Administered 2019-01-24: 30 mg via INTRAVENOUS

## 2019-01-24 MED ORDER — IOHEXOL 300 MG/ML  SOLN
INTRAMUSCULAR | Status: DC | PRN
  Administered 2019-01-24: 10 mL

## 2019-01-24 MED ORDER — CEFAZOLIN SODIUM-DEXTROSE 2-4 GM/100ML-% IV SOLN
INTRAVENOUS | Status: AC
  Filled 2019-01-24: qty 100

## 2019-01-24 MED ORDER — PROPOFOL 10 MG/ML IV BOLUS
INTRAVENOUS | Status: DC | PRN
  Administered 2019-01-24: 200 mg via INTRAVENOUS

## 2019-01-24 SURGICAL SUPPLY — 33 items
BAG DRAIN URO-CYSTO SKYTR STRL (DRAIN) ×4 IMPLANT
BAG DRN UROCATH (DRAIN) ×2
BASKET STONE 1.7 NGAGE (UROLOGICAL SUPPLIES) ×2 IMPLANT
CATH INTERMIT  6FR 70CM (CATHETERS) ×2 IMPLANT
CLOTH BEACON ORANGE TIMEOUT ST (SAFETY) ×4 IMPLANT
EVACUATOR MICROVAS BLADDER (UROLOGICAL SUPPLIES) IMPLANT
EXTRACTOR STONE 1.7FRX115CM (UROLOGICAL SUPPLIES) IMPLANT
FIBER LASER FLEXIVA 1000 (UROLOGICAL SUPPLIES) IMPLANT
FIBER LASER FLEXIVA 200 (UROLOGICAL SUPPLIES) IMPLANT
FIBER LASER FLEXIVA 365 (UROLOGICAL SUPPLIES) IMPLANT
FIBER LASER FLEXIVA 550 (UROLOGICAL SUPPLIES) IMPLANT
FIBER LASER TRAC TIP (UROLOGICAL SUPPLIES) ×4 IMPLANT
GLOVE BIO SURGEON STRL SZ 6.5 (GLOVE) ×1 IMPLANT
GLOVE BIO SURGEON STRL SZ8 (GLOVE) ×4 IMPLANT
GLOVE BIO SURGEONS STRL SZ 6.5 (GLOVE) ×1
GLOVE BIOGEL PI IND STRL 6.5 (GLOVE) IMPLANT
GLOVE BIOGEL PI INDICATOR 6.5 (GLOVE) ×2
GOWN STRL REUS W/TWL LRG LVL3 (GOWN DISPOSABLE) ×2 IMPLANT
GOWN STRL REUS W/TWL XL LVL3 (GOWN DISPOSABLE) ×4 IMPLANT
GUIDEWIRE ANG ZIPWIRE 038X150 (WIRE) ×4 IMPLANT
GUIDEWIRE STR DUAL SENSOR (WIRE) ×2 IMPLANT
IV NS 1000ML (IV SOLUTION)
IV NS 1000ML BAXH (IV SOLUTION) ×2 IMPLANT
IV NS IRRIG 3000ML ARTHROMATIC (IV SOLUTION) ×4 IMPLANT
KIT TURNOVER CYSTO (KITS) ×4 IMPLANT
MANIFOLD NEPTUNE II (INSTRUMENTS) ×4 IMPLANT
NS IRRIG 500ML POUR BTL (IV SOLUTION) ×4 IMPLANT
PACK CYSTO (CUSTOM PROCEDURE TRAY) ×4 IMPLANT
STENT URET 6FRX26 CONTOUR (STENTS) ×2 IMPLANT
SYR 10ML LL (SYRINGE) ×4 IMPLANT
TUBE CONNECTING 12'X1/4 (SUCTIONS) ×1
TUBE CONNECTING 12X1/4 (SUCTIONS) ×1 IMPLANT
TUBING UROLOGY SET (TUBING) ×4 IMPLANT

## 2019-01-24 NOTE — Transfer of Care (Signed)
Last Vitals:  Vitals Value Taken Time  BP 133/97 01/24/2019  4:45 PM  Temp 37 C 01/24/2019  4:45 PM  Pulse 76 01/24/2019  4:47 PM  Resp 17 01/24/2019  4:47 PM  SpO2 96 % 01/24/2019  4:47 PM  Vitals shown include unvalidated device data.  Last Pain:  Vitals:   01/24/19 1338  TempSrc:   PainSc: 0-No pain      Patients Stated Pain Goal: 5 (01/24/19 1338)  Immediate Anesthesia Transfer of Care Note  Patient: Michael [name]  Procedure(s) Performed: Procedure(s) (LRB): CYSTOSCOPY WITH RETROGRADE PYELOGRAM, URETEROSCOPY AND STENT PLACEMENT (Left) HOLMIUM LASER APPLICATION (Left)  Patient Location: PACU  Anesthesia Type: General  Level of Consciousness: awake, alert  and oriented  Airway & Oxygen Therapy: Patient Spontanous Breathing and Patient connected to nasal cannula oxygen  Post-op Assessment: Report given to PACU RN and Post -op Vital signs reviewed and stable  Post vital signs: Reviewed and stable  Complications: No apparent anesthesia complications

## 2019-01-24 NOTE — Op Note (Signed)
Preoperative diagnosis: Right ureteral stone  Postoperative diagnosis: Same  Procedure: 1 cystoscopy 2 right retrograde pyelography 3.  Intraoperative fluoroscopy, under one hour, with interpretation 4.  Right ureteroscopic stone manipulation with laser lithotripsy 5.  Right 6 x 26 JJ stent placement  Attending: Rosie Fate  Anesthesia: General  Estimated blood loss: None  Drains: Right 6 x 26 JJ ureteral stent without tether  Specimens: stone for analysis  Antibiotics: ancef  Findings: 73mm distal ureteral calculus. Mild hydronephrosis.  Indications: Patient is a 48 year old male with a history of ureteral stone and who has failed medical expulsive therapy.  After discussing treatment options, she decided proceed with right ureteroscopic stone manipulation.  Procedure her in detail: The patient was brought to the operating room and a brief timeout was done to ensure correct patient, correct procedure, correct site.  General anesthesia was administered patient was placed in dorsal lithotomy position.  Her genitalia was then prepped and draped in usual sterile fashion.  A rigid 91 French cystoscope was passed in the urethra and the bladder.  Bladder was inspected free masses or lesions.  the ureteral orifices were in the normal orthotopic locations.  a 6 french ureteral catheter was then instilled into the right ureter orifice.  a gentle retrograde was obtained and findings noted above.  we then placed a zip wire through the ureteral catheter and advanced up to the renal pelvis.  we then removed the cystoscope and cannulated the right ureteral orifice with a semirigid ureteroscope.  we then encountered the stone in the distal ureter. using a 200 nm laser fiber and fragmented the stone into smaller pieces.  the pieces were then removed with a Ngage basket.     once all stone fragments were removed we then placed a 6 x 26 double-j ureteral stent over the original zip wire. We then removed  the wire and good coil was noted in the the renal pelvis under fluoroscopy and the bladder under direct vision. the stone fragments were then removed from the bladder and sent for analysis.   the bladder was then drained and this concluded the procedure which was well tolerated by patient.  Complications: None  Condition: Stable, extubated, transferred to PACU  Plan: Patient is to be discharged home as to follow-up in one week for stent removal.

## 2019-01-24 NOTE — Anesthesia Procedure Notes (Signed)
Procedure Name: LMA Insertion Date/Time: 01/24/2019 4:02 PM Performed by: Mechele Claude, CRNA Pre-anesthesia Checklist: Patient identified, Emergency Drugs available, Suction available and Patient being monitored Patient Re-evaluated:Patient Re-evaluated prior to induction Oxygen Delivery Method: Circle system utilized Preoxygenation: Pre-oxygenation with 100% oxygen Induction Type: IV induction Ventilation: Mask ventilation without difficulty LMA: LMA inserted LMA Size: 5.0 Number of attempts: 1 Airway Equipment and Method: Bite block Placement Confirmation: positive ETCO2 Tube secured with: Tape Dental Injury: Teeth and Oropharynx as per pre-operative assessment

## 2019-01-24 NOTE — H&P (Signed)
Urology Admission H&P  Chief Complaint: left flank pain  History of Present Illness: Mr Michael [name] is a 48yo with a left ureteral calculus who has failed medical expulsive therapy. He has constant mild to moderate non radiating left flank pain. He has a known left distal ureteral calculus. No fevers/chills/sweats.  Past Medical History:  Diagnosis Date  . Anxiety   . Depression    Questionable per records. Not on any medication.   Marland Kitchen History of kidney stones   . Hx of echocardiogram    a. Echo 12/13/12: Mild LVH, EF 26-37%, grade 1 diastolic dysfunction, mild LAE, mild RVE, mild RAE  . Hypertension   . Insomnia   . Migraine   . Noncompliance   . OSA (obstructive sleep apnea)    a. Sleep Study 02/2013:  mod OSA, AHI 33 per hour, O2 sat nadir 85%   Past Surgical History:  Procedure Laterality Date  . Admission  04/2012   Malignant HTN, HA.  CT head negative, cardiology consult.    Marland Kitchen MANDIBLE SURGERY  1998   metal plate    Home Medications:  Current Facility-Administered Medications  Medication Dose Route Frequency Provider Last Rate Last Dose  . ceFAZolin (ANCEF) IVPB 2g/100 mL premix  2 g Intravenous 30 min Pre-Op Cleon Gustin, MD      . lactated ringers infusion   Intravenous Continuous Brennan Bailey, MD 50 mL/hr at 01/24/19 1326 50 mL/hr at 01/24/19 1326   Allergies:  Allergies  Allergen Reactions  . Statins Other (See Comments)    unknown    Family History  Problem Relation Age of Onset  . Cancer Father        prostate, colon- age was in his 53's  . Heart disease Father 58       MI   . Hypertension Father   . Diabetes Mother   . Hypertension Mother   . Hypertension Brother   . Cancer Maternal Grandfather   . Cancer Paternal Grandfather    Social History:  reports that he has never smoked. He has never used smokeless tobacco. He reports that he does not drink alcohol or use drugs.  Review of Systems  Genitourinary: Positive for flank pain.  All other  systems reviewed and are negative.   Physical Exam:  Vital signs in last 24 hours: Temp:  [98.2 F (36.8 C)] 98.2 F (36.8 C) (01/27 1246) Pulse Rate:  [69] 69 (01/27 1246) Resp:  [18] 18 (01/27 1246) BP: (154)/(116) 154/116 (01/27 1246) SpO2:  [100 %] 100 % (01/27 1246) Weight:  [132.1 kg] 132.1 kg (01/27 1246) Physical Exam  Constitutional: He is oriented to person, place, and time. He appears well-developed and well-nourished.  HENT:  Head: Normocephalic and atraumatic.  Eyes: Pupils are equal, round, and reactive to light. EOM are normal.  Neck: Normal range of motion. No thyromegaly present.  Cardiovascular: Normal rate and regular rhythm.  Respiratory: Effort normal. No respiratory distress.  GI: Soft. He exhibits no distension.  Musculoskeletal: Normal range of motion.        General: No edema.  Neurological: He is alert and oriented to person, place, and time.  Skin: Skin is warm and dry.  Psychiatric: He has a normal mood and affect. His behavior is normal. Judgment and thought content normal.    Laboratory Data:  Results for orders placed or performed during the hospital encounter of 01/24/19 (from the past 24 hour(s))  Basic metabolic panel     Status: Abnormal  Collection Time: 01/24/19  1:30 PM  Result Value Ref Range   Sodium 139 135 - 145 mmol/L   Potassium 3.2 (L) 3.5 - 5.1 mmol/L   Chloride 106 98 - 111 mmol/L   CO2 27 22 - 32 mmol/L   Glucose, Bld 94 70 - 99 mg/dL   BUN 16 6 - 20 mg/dL   Creatinine, Ser 0.98 0.61 - 1.24 mg/dL   Calcium 8.3 (L) 8.9 - 10.3 mg/dL   GFR calc non Af Amer >60 >60 mL/min   GFR calc Af Amer >60 >60 mL/min   Anion gap 6 5 - 15  CBC     Status: Abnormal   Collection Time: 01/24/19  1:30 PM  Result Value Ref Range   WBC 5.7 4.0 - 10.5 K/uL   RBC 4.49 4.22 - 5.81 MIL/uL   Hemoglobin 12.8 (L) 13.0 - 17.0 g/dL   HCT 40.2 39.0 - 52.0 %   MCV 89.5 80.0 - 100.0 fL   MCH 28.5 26.0 - 34.0 pg   MCHC 31.8 30.0 - 36.0 g/dL   RDW  14.9 11.5 - 15.5 %   Platelets 199 150 - 400 K/uL   nRBC 0.0 0.0 - 0.2 %   No results found for this or any previous visit (from the past 240 hour(s)). Creatinine: Recent Labs    01/24/19 1330  CREATININE 0.98   Baseline Creatinine: 1  Impression/Assessment:  47yo with left ureteral calculus  Plan:  The risks/benefits/alternatives to left ureteroscopic stone extraction was explained to the patient and he understands and wishes to proceed with surgery  Nicolette Bang 01/24/2019, 3:26 PM

## 2019-01-24 NOTE — Anesthesia Postprocedure Evaluation (Signed)
Anesthesia Post Note  Patient: Michael [name]  Procedure(s) Performed: CYSTOSCOPY WITH RETROGRADE PYELOGRAM, URETEROSCOPY AND STENT PLACEMENT (Left Ureter) HOLMIUM LASER APPLICATION (Left Urethra)     Patient location during evaluation: PACU Anesthesia Type: General Level of consciousness: awake and alert Pain management: pain level controlled Vital Signs Assessment: post-procedure vital signs reviewed and stable Respiratory status: spontaneous breathing, nonlabored ventilation, respiratory function stable and patient connected to nasal cannula oxygen Cardiovascular status: blood pressure returned to baseline and stable Postop Assessment: no apparent nausea or vomiting Anesthetic complications: no    Last Vitals:  Vitals:   01/24/19 1700 01/24/19 1715  BP: (!) 140/104 (!) 131/102  Pulse: 69 65  Resp: 16 15  Temp:    SpO2: 96% 93%    Last Pain:  Vitals:   01/24/19 1645  TempSrc:   PainSc: 0-No pain                 Priyal Musquiz DAVID

## 2019-01-24 NOTE — Discharge Instructions (Signed)
Ureteral Stent Implantation, Care After Refer to this sheet in the next few weeks. These instructions provide you with information about caring for yourself after your procedure. Your health care provider may also give you more specific instructions. Your treatment has been planned according to current medical practices, but problems sometimes occur. Call your health care provider if you have any problems or questions after your procedure. What can I expect after the procedure? After the procedure, it is common to have:  Nausea.  Mild pain when you urinate. You may feel this pain in your lower back or lower abdomen. Pain should stop within a few minutes after you urinate. This may last for up to 1 week.  A small amount of blood in your urine for several days. Follow these instructions at home:  Medicines  Take over-the-counter and prescription medicines only as told by your health care provider.  If you were prescribed an antibiotic medicine, take it as told by your health care provider. Do not stop taking the antibiotic even if you start to feel better.  Do not drive for 24 hours if you received a sedative.  Do not drive or operate heavy machinery while taking prescription pain medicines. Activity  Return to your normal activities as told by your health care provider. Ask your health care provider what activities are safe for you.  Do not lift anything that is heavier than 10 lb (4.5 kg). Follow this limit for 1 week after your procedure, or for as long as told by your health care provider. General instructions  Watch for any blood in your urine. Call your health care provider if the amount of blood in your urine increases.  If you have a catheter: ? Follow instructions from your health care provider about taking care of your catheter and collection bag. ? Do not take baths, swim, or use a hot tub until your health care provider approves.  Drink enough fluid to keep your urine  clear or pale yellow.  Keep all follow-up visits as told by your health care provider. This is important. Contact a health care provider if:  You have pain that gets worse or does not get better with medicine, especially pain when you urinate.  You have difficulty urinating.  You feel nauseous or you vomit repeatedly during a period of more than 2 days after the procedure. Get help right away if:  Your urine is dark red or has blood clots in it.  You are leaking urine (have incontinence).  The end of the stent comes out of your urethra.  You cannot urinate.  You have sudden, sharp, or severe pain in your abdomen or lower back.  You have a fever. This information is not intended to replace advice given to you by your health care provider. Make sure you discuss any questions you have with your health care provider. Document Released: 08/17/2013 Document Revised: 05/22/2016 Document Reviewed: 06/29/2015 Elsevier Interactive Patient Education  2019 [facility] Instructions  Activity: Get plenty of rest for the remainder of the day. A responsible individual must stay with you for 24 hours following the procedure.  For the next 24 hours, DO NOT: -Drive a car -Paediatric nurse -Drink alcoholic beverages -Take any medication unless instructed by your physician -Make any legal decisions or sign important papers.  Meals: Start with liquid foods such as gelatin or soup. Progress to regular foods as tolerated. Avoid greasy, spicy, heavy foods. If nausea  and/or vomiting occur, drink only clear liquids until the nausea and/or vomiting subsides. Call your physician if vomiting continues.  Special Instructions/Symptoms: Your throat may feel dry or sore from the anesthesia or the breathing tube placed in your throat during surgery. If this causes discomfort, gargle with warm salt water. The discomfort should disappear within 24 hours.

## 2019-01-25 ENCOUNTER — Encounter (HOSPITAL_BASED_OUTPATIENT_CLINIC_OR_DEPARTMENT_OTHER): Payer: Self-pay | Admitting: Urology

## 2019-05-26 ENCOUNTER — Other Ambulatory Visit: Payer: Self-pay

## 2019-05-26 ENCOUNTER — Emergency Department (HOSPITAL_COMMUNITY): Payer: Medicare Other

## 2019-05-26 ENCOUNTER — Emergency Department (HOSPITAL_COMMUNITY)
Admission: EM | Admit: 2019-05-26 | Discharge: 2019-05-26 | Disposition: A | Discharge status: Home / Self Care | Payer: Medicare Other | Attending: Emergency Medicine | Admitting: Emergency Medicine

## 2019-05-26 ENCOUNTER — Encounter (HOSPITAL_COMMUNITY): Payer: Self-pay | Admitting: Emergency Medicine

## 2019-05-26 DIAGNOSIS — R42 Dizziness and giddiness: Secondary | ICD-10-CM | POA: Diagnosis present

## 2019-05-26 DIAGNOSIS — H53149 Visual discomfort, unspecified: Secondary | ICD-10-CM | POA: Insufficient documentation

## 2019-05-26 DIAGNOSIS — R51 Headache: Secondary | ICD-10-CM | POA: Insufficient documentation

## 2019-05-26 DIAGNOSIS — H538 Other visual disturbances: Secondary | ICD-10-CM | POA: Diagnosis not present

## 2019-05-26 DIAGNOSIS — I160 Hypertensive urgency: Secondary | ICD-10-CM | POA: Diagnosis not present

## 2019-05-26 DIAGNOSIS — Z79899 Other long term (current) drug therapy: Secondary | ICD-10-CM | POA: Diagnosis not present

## 2019-05-26 DIAGNOSIS — R519 Headache, unspecified: Secondary | ICD-10-CM

## 2019-05-26 LAB — URINALYSIS, ROUTINE W REFLEX MICROSCOPIC
Bilirubin Urine: NEGATIVE
Glucose, UA: NEGATIVE mg/dL
Hgb urine dipstick: NEGATIVE
Ketones, ur: NEGATIVE mg/dL
Leukocytes,Ua: NEGATIVE
Nitrite: NEGATIVE
Protein, ur: NEGATIVE mg/dL
Specific Gravity, Urine: 1.016 (ref 1.005–1.030)
pH: 6 (ref 5.0–8.0)

## 2019-05-26 LAB — CBC
HCT: 42.9 % (ref 39.0–52.0)
Hemoglobin: 13.9 g/dL (ref 13.0–17.0)
MCH: 28.3 pg (ref 26.0–34.0)
MCHC: 32.4 g/dL (ref 30.0–36.0)
MCV: 87.2 fL (ref 80.0–100.0)
Platelets: 227 10*3/uL (ref 150–400)
RBC: 4.92 MIL/uL (ref 4.22–5.81)
RDW: 14.9 % (ref 11.5–15.5)
WBC: 5.5 10*3/uL (ref 4.0–10.5)
nRBC: 0 % (ref 0.0–0.2)

## 2019-05-26 LAB — BASIC METABOLIC PANEL
Anion gap: 13 (ref 5–15)
BUN: 11 mg/dL (ref 6–20)
CO2: 25 mmol/L (ref 22–32)
Calcium: 9.2 mg/dL (ref 8.9–10.3)
Chloride: 100 mmol/L (ref 98–111)
Creatinine, Ser: 0.95 mg/dL (ref 0.61–1.24)
GFR calc Af Amer: >60 mL/min (ref >60)
GFR calc non Af Amer: >60 mL/min (ref >60)
Glucose, Bld: 99 mg/dL (ref 70–99)
Potassium: 3.9 mmol/L (ref 3.5–5.1)
Sodium: 138 mmol/L (ref 135–145)

## 2019-05-26 LAB — CBG MONITORING, ED: Glucose-Capillary: 79 mg/dL (ref 70–99)

## 2019-05-26 LAB — TROPONIN I: Troponin I: <0.03 ng/mL (ref <0.03)

## 2019-05-26 IMAGING — CT CT HEAD WITHOUT CONTRAST
4 series · 15 of 47 positions shown, 17 images · non-contrast
Comparison: [DATE]

CLINICAL DATA: Dizziness and hypertension

EXAM:
CT HEAD WITHOUT CONTRAST
TECHNIQUE: Contiguous axial images were obtained from the base of the skull
through the vertex without intravenous contrast.

[Series 3: head without · axial · non-contrast · 0.48mm/px · z∈[-177,-47]mm · 7 of 36 slices shown, 9 images]
[im 5/36  brain]
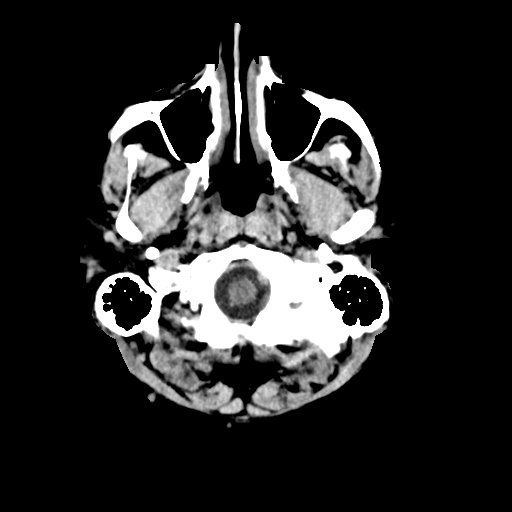
[im 5/36  bone]
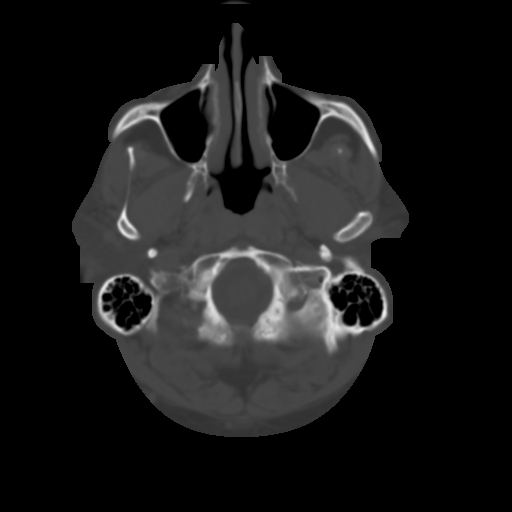
[im 9/36  brain]
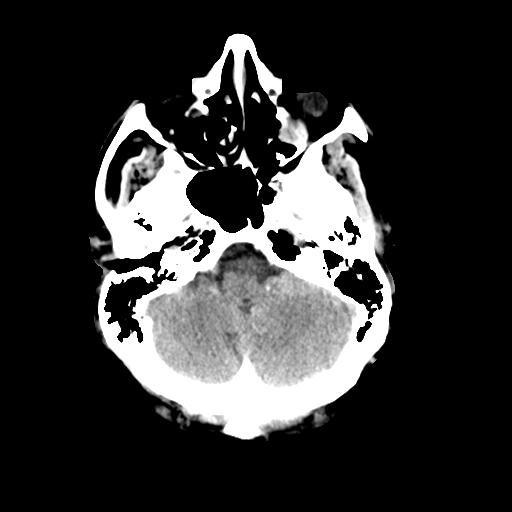
[im 14/36  brain]
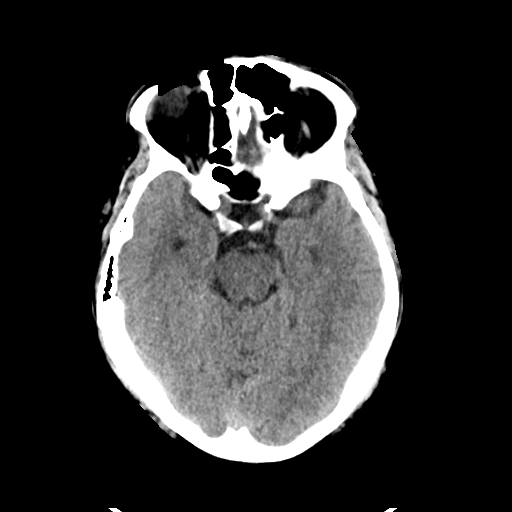
[im 18/36  brain]
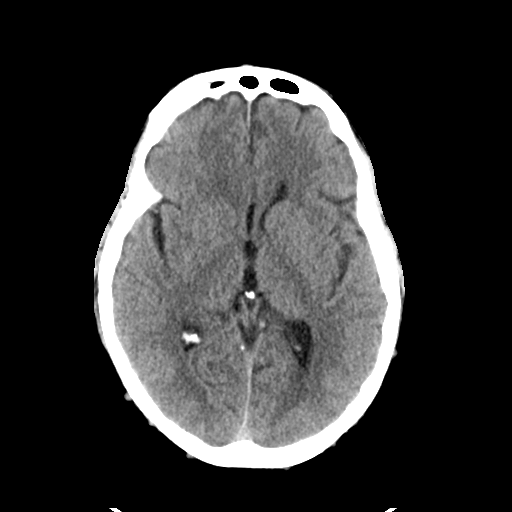
[im 22/36  brain]
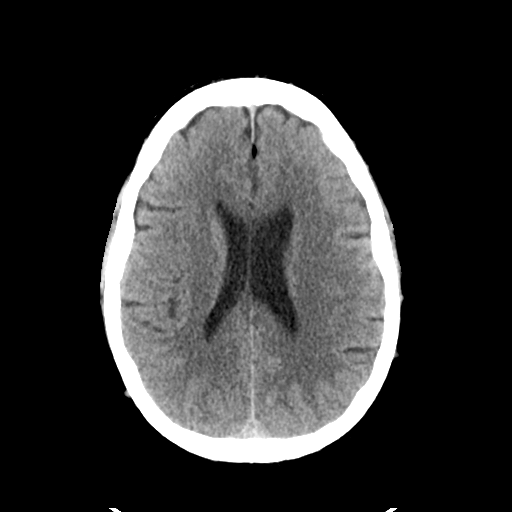
[im 22/36  bone]
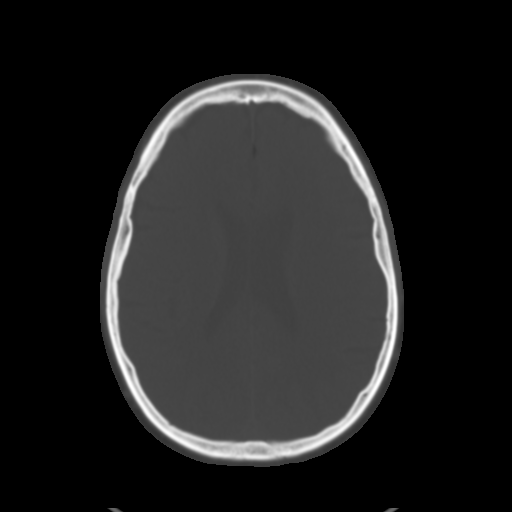
[im 27/36  brain]
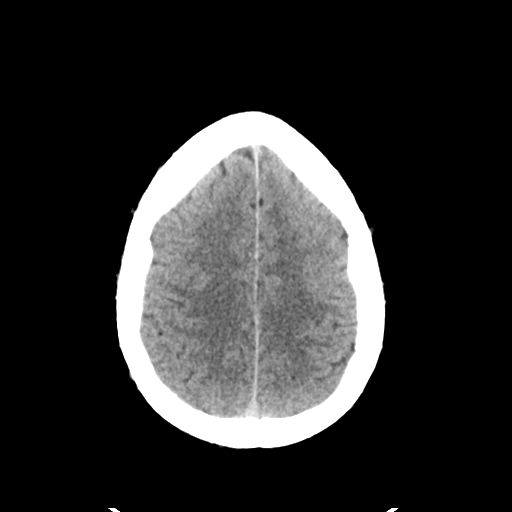
[im 31/36  brain]
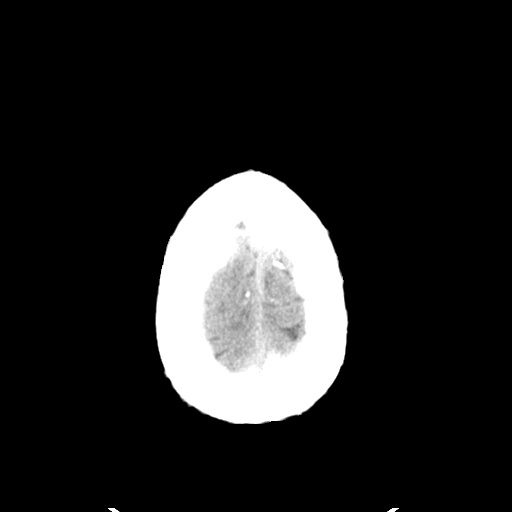

[Series 4: head bone · axial · 0.48mm/px · z∈[-181,-163]mm · 2 of 89 slices shown]
[im 9/89  bone]
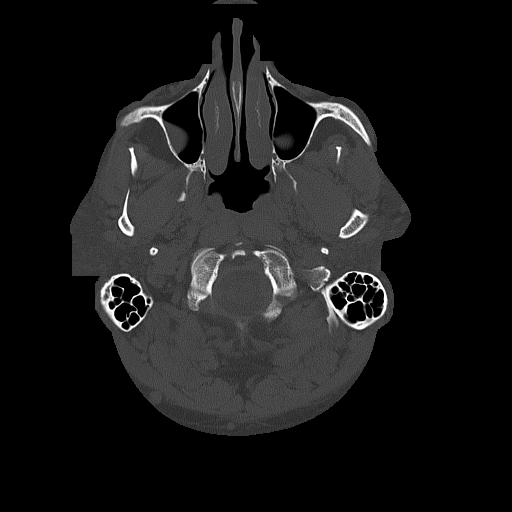
[im 18/89  bone]
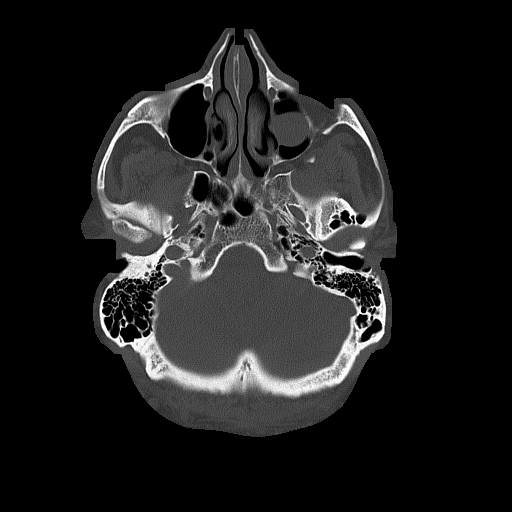

[Series 5: head without cor · coronal · non-contrast · 0.35mm/px · 3 of 83 slices shown]
[im 28/83  brain]
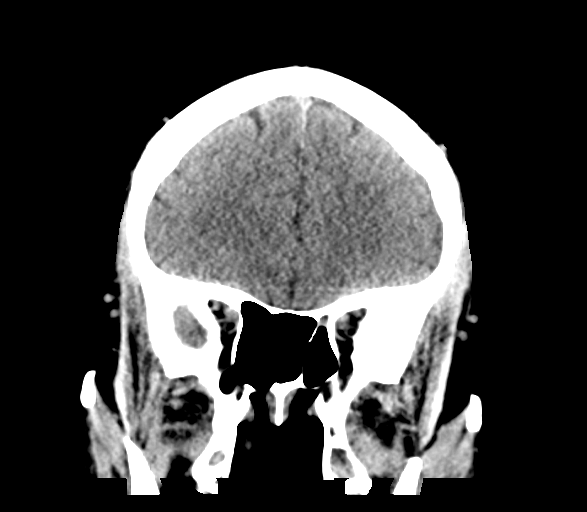
[im 37/83  brain]
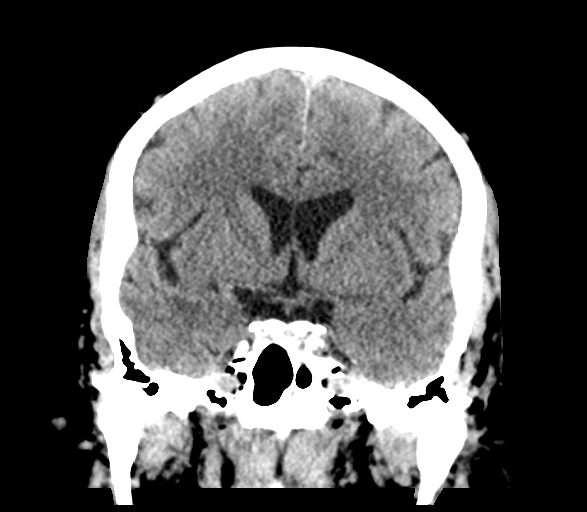
[im 46/83  brain]
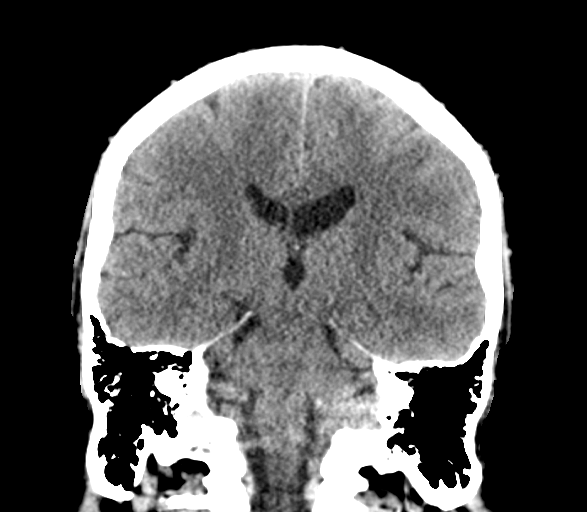

[Series 6: head without sag · sagittal · non-contrast · 0.35mm/px · 3 of 67 slices shown]
[im 23/67  brain]
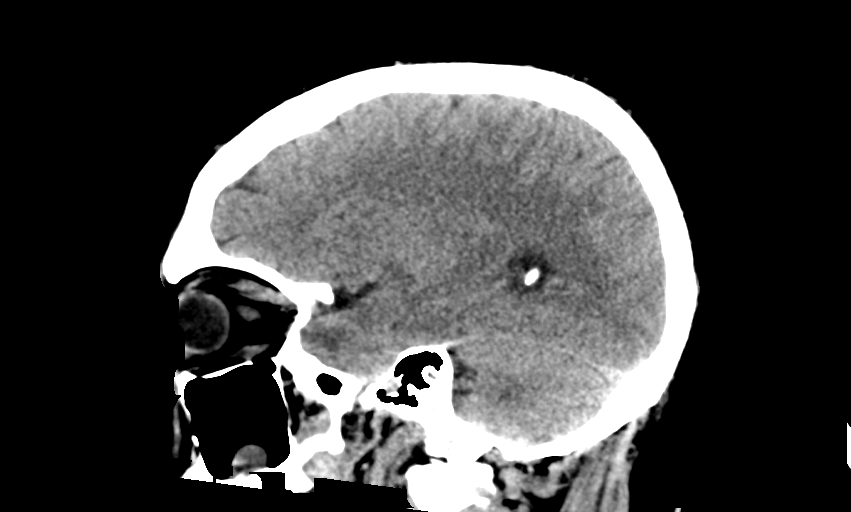
[im 34/67  brain]
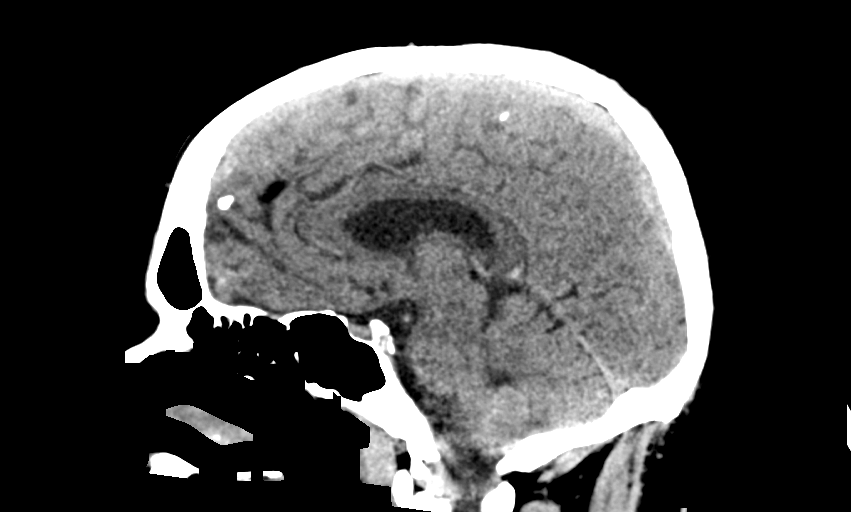
[im 45/67  brain]
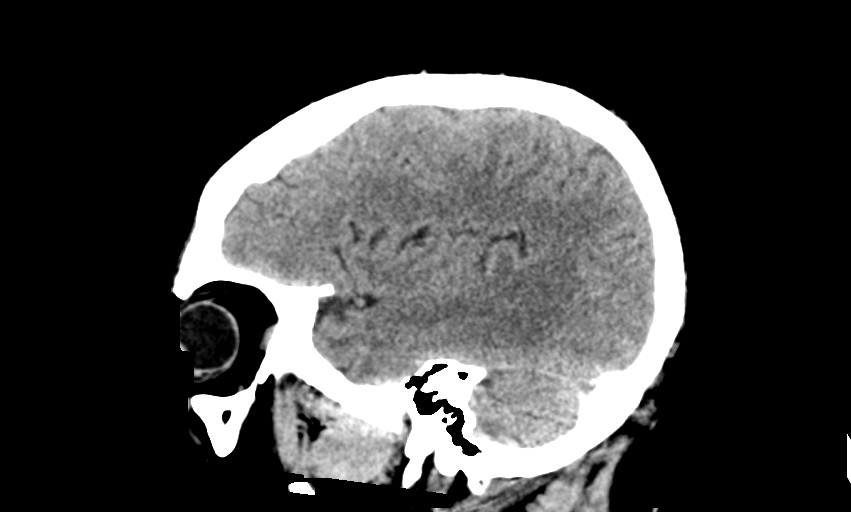

[15 of 47 positions shown; findings below may reference images not displayed]

FINDINGS: Brain: No evidence of acute infarction, hemorrhage, hydrocephalus,
extra-axial collection or mass lesion/mass effect.

Vascular: No hyperdense vessel or unexpected calcification.

Skull: Normal. Negative for fracture or focal lesion.

Sinuses/Orbits: Orbits are within normal limits. Left maxillary
mucosal retention cyst.

Other: None.
IMPRESSION: No acute intracranial abnormality noted.

## 2019-05-26 MED ORDER — PROCHLORPERAZINE EDISYLATE 10 MG/2ML IJ SOLN
10.0000 mg | Freq: Once | INTRAMUSCULAR | Status: AC
  Administered 2019-05-26: 10 mg via INTRAVENOUS
  Filled 2019-05-26: qty 2

## 2019-05-26 MED ORDER — LISINOPRIL 20 MG PO TABS
20.0000 mg | ORAL_TABLET | Freq: Once | ORAL | Status: AC
  Administered 2019-05-26: 20:00:00 20 mg via ORAL
  Filled 2019-05-26: qty 1

## 2019-05-26 MED ORDER — SODIUM CHLORIDE 0.9 % IV BOLUS
1000.0000 mL | Freq: Once | INTRAVENOUS | Status: AC
  Administered 2019-05-26: 18:00:00 1000 mL via INTRAVENOUS

## 2019-05-26 MED ORDER — DIPHENHYDRAMINE HCL 50 MG/ML IJ SOLN
25.0000 mg | Freq: Once | INTRAMUSCULAR | Status: AC
  Administered 2019-05-26: 18:00:00 25 mg via INTRAVENOUS
  Filled 2019-05-26: qty 1

## 2019-05-26 MED ORDER — HYDROCHLOROTHIAZIDE 25 MG PO TABS
25.0000 mg | ORAL_TABLET | Freq: Once | ORAL | Status: AC
  Administered 2019-05-26: 20:00:00 25 mg via ORAL
  Filled 2019-05-26: qty 1

## 2019-05-26 MED ORDER — DEXAMETHASONE SODIUM PHOSPHATE 10 MG/ML IJ SOLN
10.0000 mg | Freq: Once | INTRAMUSCULAR | Status: AC
  Administered 2019-05-26: 18:00:00 10 mg via INTRAMUSCULAR
  Filled 2019-05-26: qty 1

## 2019-05-26 MED ORDER — SODIUM CHLORIDE 0.9% FLUSH
3.0000 mL | Freq: Once | INTRAVENOUS | Status: AC
  Administered 2019-05-26: 18:00:00 3 mL via INTRAVENOUS

## 2019-05-26 NOTE — ED Provider Notes (Signed)
[facility] EMERGENCY DEPARTMENT Provider Note   CSN: [id number] Arrival date & time: 05/26/19  1655    History   Chief Complaint Chief Complaint  Patient presents with  . Dizziness  . Hypertension    HPI Michael [name] is a 48 y.o. male with history of hypertension, medication noncompliance, migraine headaches, OSA, anxiety and depression, nephrolithiasis presenting for evaluation of gradual onset, progressively worsening right-sided headache beginning around midnight last night.  Reports that the headache has been 10/10 in severity, with associated phonophobia and blurred vision bilaterally which she noticed just prior to coming to the ED.  He reports that he checked his blood pressure at home which read 751 systolic but is unsure if his blood pressure cuff was working properly.  He reports he has been compliant with his home medications.  Denies chest pain, shortness of breath, dysarthria, aphasia, facial droop, numbness or weakness of the extremities, abdominal pain, nausea, or vomiting.  He denies dizziness but reports some lightheadedness with position changes, no syncope.  No recent head injury or falls.  Has not tried anything for his headaches.  He called his PCP who recommended presentation to urgent care who sent him to the ED for evaluation of hypertensive urgency.     The history is provided by the patient and medical records.    Past Medical History:  Diagnosis Date  . Anxiety   . Depression    Questionable per records. Not on any medication.   Marland Kitchen History of kidney stones   . Hx of echocardiogram    a. Echo 12/13/12: Mild LVH, EF 70-01%, grade 1 diastolic dysfunction, mild LAE, mild RVE, mild RAE  . Hypertension   . Insomnia   . Migraine   . Noncompliance   . OSA (obstructive sleep apnea)    a. Sleep Study 02/2013:  mod OSA, AHI 33 per hour, O2 sat nadir 85%    Patient Active Problem List   Diagnosis Date Noted  . Hypokalemia 04/04/2017  . Morbid  obesity ([place]) 04/04/2017  . Chest wall pain 04/04/2017  . Asthma 02/13/2016  . Non compliance w medication regimen 06/13/2015  . OSA (obstructive sleep apnea) 04/06/2013  . Abnormal EKG 11/08/2012  . Essential hypertension   . HTN (hypertension), malignant 04/29/2012  . Migraine headache with aura 04/29/2012    Past Surgical History:  Procedure Laterality Date  . Admission  04/2012   Malignant HTN, HA.  CT head negative, cardiology consult.    . CYSTOSCOPY WITH RETROGRADE PYELOGRAM, URETEROSCOPY AND STENT PLACEMENT Left 01/24/2019   Procedure: CYSTOSCOPY WITH RETROGRADE PYELOGRAM, URETEROSCOPY AND STENT PLACEMENT;  Surgeon: Cleon Gustin, MD;  Location: [facility];  Service: Urology;  Laterality: Left;  . HOLMIUM LASER APPLICATION Left 7/49/4496   Procedure: HOLMIUM LASER APPLICATION;  Surgeon: Cleon Gustin, MD;  Location[facility];  Service: Urology;  Laterality: Left;  Marland Kitchen MANDIBLE SURGERY  1998   metal plate        Home Medications    Prior to Admission medications   Medication Sig Start Date End Date Taking? Authorizing Provider  amLODipine (NORVASC) 10 MG tablet TAKE 1 TABLET BY MOUTH DAILY 04/03/17   Tenna Delaine D, PA-C  buPROPion (WELLBUTRIN XL) 150 MG 24 hr tablet Take 150 mg by mouth daily.    [provider]  carvedilol (COREG) 6.25 MG tablet Take 6.25 mg by mouth 2 (two) times daily.  05/11/17   Sueanne Margarita, MD  chlorthalidone (  HYGROTON) 25 MG tablet Take 1 tablet (25 mg total) by mouth daily. 05/08/17 01/14/19  Sueanne Margarita, MD  lisinopril (PRINIVIL,ZESTRIL) 40 MG tablet Take 1 tablet (40 mg total) by mouth daily. Patient taking differently: Take 50 mg by mouth daily.  04/03/17   Tenna Delaine D, PA-C  oxyCODONE-acetaminophen (PERCOCET) 5-325 MG tablet Take 1 tablet by mouth every 4 (four) hours as needed (for flank pain). 01/24/19   McKenzie, Candee Furbish, MD  potassium chloride (K-DUR) 10 MEQ tablet TAKE 1  TABLET(10 MEQ) BY MOUTH DAILY Patient taking differently: Take 10 mEq by mouth daily. TAKE 1 TABLET(10 MEQ) BY MOUTH DAILY 12/23/16   [name], MD  tamsulosin (FLOMAX) 0.4 MG CAPS capsule Take 1 capsule daily until stone passes. 01/14/19   Molpus, John, MD    Family History Family History  Problem Relation Age of Onset  . Cancer Father        prostate, colon- age was in his 6's  . Heart disease Father 25       MI   . Hypertension Father   . Diabetes Mother   . Hypertension Mother   . Hypertension Brother   . Cancer Maternal Grandfather   . Cancer Paternal Grandfather     Social History Social History   Tobacco Use  . Smoking status: Never Smoker  . Smokeless tobacco: Never Used  Substance Use Topics  . Alcohol use: No    Alcohol/week: 0.0 standard drinks  . Drug use: No     Allergies   Statins   Review of Systems Review of Systems  Constitutional: Negative for chills and fever.  Eyes: Positive for photophobia and visual disturbance. Negative for pain.  Respiratory: Negative for shortness of breath.   Cardiovascular: Negative for chest pain.  Gastrointestinal: Negative for abdominal pain, nausea and vomiting.  Neurological: Positive for light-headedness and headaches. Negative for syncope, weakness and numbness.  All other systems reviewed and are negative.    Physical Exam Updated Vital Signs BP (!) 145/101   Pulse 80   Temp 98.3 F (36.8 C) (Oral)   Resp 20   SpO2 95%   Physical Exam Vitals signs and nursing note reviewed.  Constitutional:      General: He is not in acute distress.    Appearance: He is well-developed.  HENT:     Head: Normocephalic and atraumatic.  Eyes:     General:        Right eye: No discharge.        Left eye: No discharge.     Extraocular Movements: Extraocular movements intact.     Conjunctiva/sclera: Conjunctivae normal.     Pupils: Pupils are equal, round, and reactive to light.  Neck:     Musculoskeletal:  Normal range of motion.     Vascular: No JVD.     Trachea: No tracheal deviation.  Cardiovascular:     Rate and Rhythm: Normal rate and regular rhythm.     Pulses: Normal pulses.     Heart sounds: Normal heart sounds.  Pulmonary:     Effort: Pulmonary effort is normal.     Breath sounds: Normal breath sounds.  Abdominal:     General: Bowel sounds are normal. There is no distension.     Palpations: Abdomen is soft.     Tenderness: There is no abdominal tenderness. There is no guarding or rebound.  Musculoskeletal: Normal range of motion.  Skin:    General: Skin is warm and dry.  Findings: No erythema.  Neurological:     General: No focal deficit present.     Mental Status: He is alert and oriented to person, place, and time.     Comments: Mental Status:  Alert, thought content appropriate, able to give a coherent history. Speech fluent without evidence of aphasia. Able to follow 2 step commands without difficulty.  Cranial Nerves:  II:  Peripheral visual fields grossly normal, pupils equal, round, reactive to light III,IV, VI: ptosis not present, extra-ocular motions intact bilaterally  V,VII: smile symmetric, facial light touch sensation equal VIII: hearing grossly normal to voice  X: uvula elevates symmetrically  XI: bilateral shoulder shrug symmetric and strong XII: midline tongue extension without fassiculations Motor:  Normal tone. 5/5 strength of BUE and BLE major muscle groups including strong and equal grip strength and dorsiflexion/plantar flexion, no pronator drift Sensory: light touch normal in all extremities. Cerebellar: normal finger-to-nose with bilateral upper extremities, Romberg sign absent Gait: normal gait and balance. Able to walk on toes and heels with ease.     Psychiatric:        Behavior: Behavior normal.      ED Treatments / Results  Labs (all labs ordered are listed, but only abnormal results are displayed) Labs Reviewed  BASIC METABOLIC  PANEL  CBC  URINALYSIS, ROUTINE W REFLEX MICROSCOPIC  TROPONIN I  CBG MONITORING, ED    EKG EKG Interpretation  Date/Time:  Thursday May 26 2019 17:11:07 EDT Ventricular Rate:  68 PR Interval:    QRS Duration: 108 QT Interval:  408 QTC Calculation: 434 R Axis:   15 Text Interpretation:  Sinus rhythm Borderline T abnormalities, inferior leads Confirmed by Davonna Belling [phone]) on 05/26/2019 7:49:10 PM Also confirmed by Davonna Belling [phone]), editor Philomena Doheny [phone])  on 05/27/2019 6:59:24 AM   Radiology Ct Head Wo Contrast  Result Date: 05/26/2019 CLINICAL DATA:  Dizziness and hypertension EXAM: CT HEAD WITHOUT CONTRAST TECHNIQUE: Contiguous axial images were obtained from the base of the skull through the vertex without intravenous contrast. COMPARISON:  04/03/2017 FINDINGS: Brain: No evidence of acute infarction, hemorrhage, hydrocephalus, extra-axial collection or mass lesion/mass effect. Vascular: No hyperdense vessel or unexpected calcification. Skull: Normal. Negative for fracture or focal lesion. Sinuses/Orbits: Orbits are within normal limits. Left maxillary mucosal retention cyst. Other: None. IMPRESSION: No acute intracranial abnormality noted. Electronically Signed   By: Inez Catalina M.D.   On: 05/26/2019 19:33    Procedures Procedures (including critical care time)  Medications Ordered in ED Medications  sodium chloride flush (NS) 0.9 % injection 3 mL (3 mLs Intravenous Given 05/26/19 1814)  prochlorperazine (COMPAZINE) injection 10 mg (10 mg Intravenous Given 05/26/19 1759)  diphenhydrAMINE (BENADRYL) injection 25 mg (25 mg Intravenous Given 05/26/19 1758)  sodium chloride 0.9 % bolus 1,000 mL (0 mLs Intravenous Stopped 05/26/19 1930)  dexamethasone (DECADRON) injection 10 mg (10 mg Intramuscular Given 05/26/19 1801)  lisinopril (ZESTRIL) tablet 20 mg (20 mg Oral Given 05/26/19 2016)  hydrochlorothiazide (HYDRODIURIL) tablet 25 mg (25 mg Oral Given 05/26/19 2015)      Initial Impression / Assessment and Plan / ED Course  I have reviewed the triage vital signs and the nursing notes.  Pertinent labs & imaging results that were available during my care of the patient were reviewed by me and considered in my medical decision making (see chart for details).        Patient presenting for evaluation of headache beginning earlier today.  Also noted to be hypertensive while  in the ED.  He does report medication compliance although has a history of noncompliance previously.  He was sent from urgent care for rule out of hypertensive emergency.  His EKG shows normal sinus rhythm, nonspecific T wave abnormalities.  He has no chest pain and his troponin is negative.  Doubt ACS/MI.  Doubt dissection.  He has a normal neurologic examination and head CT shows no acute intracranial abnormalities.  Remainder of blood work reviewed by me is entirely unremarkable.  He was given a migraine cocktail in the ED and on reevaluation is resting comfortably in no apparent distress.  He reports that his headache is significantly better, rates it at 3/10 in severity.  Denies any other symptoms.  On reassessment his blood pressure was still elevated.  We will give a second dose of his blood pressure medication and reassess.  Blood pressure has improved on reassessment.  We discussed the possibility of admission given his blood pressures have been somewhat labile.  He would like to go home which I think is reasonable as he is overall well-appearing with no evidence of endorgan damage work-up today.  Recommend close follow-up with PCP for medication management and further assessment.  Discussed strict ED return precautions.  Patient verbalized understanding of and agreement with plan and is safe for discharge home at this time.    Final Clinical Impressions(s) / ED Diagnoses   Final diagnoses:  Hypertensive urgency  Right-sided headache    ED Discharge Orders    None       Debroah Baller 05/27/19 2248    Davonna Belling, MD 05/28/19 1059

## 2019-05-26 NOTE — ED Notes (Signed)
Nurse Navigator communication: The patient has a cell phone at bedside and is able to give updates to family.     

## 2019-05-26 NOTE — ED Triage Notes (Signed)
Patient presents to the ED by EMS with c/o dizziness and HTN and he is coming from his PCP today for evaluation. Denies chest pain of SOB, has had a headache. A/O X4 NAD at this time.   VS  160/64 70 NSR 96% ra 139 CBG 97.7 Temp

## 2019-05-26 NOTE — ED Notes (Signed)
Patient transported to CT 

## 2019-05-26 NOTE — Discharge Instructions (Signed)
Take 1 to 2 tablets of Tylenol every 6 hours as needed for headache.  Drink plenty of water and get plenty of rest  Your work-up today was reassuring that your high blood pressure is not causing any sign of damage to your organs however your blood pressure did not improve significantly while you are in the emergency department.  We discussed the risks of going home without further work-up including stroke, heart attack, worsening symptoms.  Please call your primary care doctor first thing tomorrow morning for further recommendations or office visit.  Your doctor may want to increase some of your medications.  Return to the emergency department immediately if any concerning signs or symptoms develop such as worsening headache, vision changes, persistent vomiting, weakness to one side of the body, chest pain, or shortness of breath.  If your blood pressure (BP) was elevated on multiple readings during this visit above 130 for the top number or above 80 for the bottom number, please have this repeated by your primary care provider within one month. You can also check your blood pressure when you are out at a pharmacy or grocery store. Many have machines that will check your blood pressure.  If your blood pressure remains elevated, please follow-up with your PCP.

## 2019-05-26 NOTE — ED Notes (Signed)
Patient verbalizes understanding of discharge instructions. Opportunity for questioning and answers were provided. Armband removed by staff, pt discharged from ED.  

## 2019-05-26 NOTE — ED Notes (Signed)
Michael [name], [phone] for updates

## 2019-07-06 ENCOUNTER — Telehealth: Payer: Self-pay | Admitting: Neurology

## 2019-07-06 ENCOUNTER — Ambulatory Visit: Payer: Self-pay | Admitting: Neurology

## 2019-07-06 NOTE — Telephone Encounter (Signed)
Patient was no show for apt this morning.

## 2019-07-11 ENCOUNTER — Encounter: Payer: Self-pay | Admitting: Neurology

## 2019-08-17 ENCOUNTER — Other Ambulatory Visit: Payer: Self-pay | Admitting: Neurology

## 2019-08-18 ENCOUNTER — Other Ambulatory Visit: Payer: Self-pay

## 2019-08-18 ENCOUNTER — Ambulatory Visit (INDEPENDENT_AMBULATORY_CARE_PROVIDER_SITE_OTHER): Payer: Medicare Other | Admitting: Neurology

## 2019-08-18 ENCOUNTER — Encounter: Payer: Self-pay | Admitting: Neurology

## 2019-08-18 VITALS — BP 150/111 | HR 71 | Temp 97.5°F | Ht 69.0 in | Wt 301.0 lb

## 2019-08-18 DIAGNOSIS — Z8679 Personal history of other diseases of the circulatory system: Secondary | ICD-10-CM

## 2019-08-18 DIAGNOSIS — I479 Paroxysmal tachycardia, unspecified: Secondary | ICD-10-CM | POA: Diagnosis not present

## 2019-08-18 DIAGNOSIS — I5031 Acute diastolic (congestive) heart failure: Secondary | ICD-10-CM

## 2019-08-18 DIAGNOSIS — F1190 Opioid use, unspecified, uncomplicated: Secondary | ICD-10-CM | POA: Diagnosis not present

## 2019-08-18 DIAGNOSIS — G4737 Central sleep apnea in conditions classified elsewhere: Secondary | ICD-10-CM

## 2019-08-18 DIAGNOSIS — R9431 Abnormal electrocardiogram [ECG] [EKG]: Secondary | ICD-10-CM | POA: Diagnosis not present

## 2019-08-18 DIAGNOSIS — Z6841 Body Mass Index (BMI) 40.0 and over, adult: Secondary | ICD-10-CM

## 2019-08-18 DIAGNOSIS — G4731 Primary central sleep apnea: Secondary | ICD-10-CM

## 2019-08-18 DIAGNOSIS — F418 Other specified anxiety disorders: Secondary | ICD-10-CM

## 2019-08-18 DIAGNOSIS — F40240 Claustrophobia: Secondary | ICD-10-CM | POA: Insufficient documentation

## 2019-08-18 MED ORDER — TRAZODONE HCL 50 MG PO TABS: 50.0000 mg | ORAL_TABLET | Freq: Every day | ORAL | 1 refills | Status: DC

## 2019-08-18 NOTE — Progress Notes (Signed)
SLEEP MEDICINE CLINIC   Provider:  Larey Seat, MD  Primary Care Physician:  Bartholome Bill, MD   Referring Provider: Bartholome Bill, MD  and Tenna Delaine, [place]    Chief Complaint  Patient presents with  . Follow-up    pt alone, rm 11. pt unable to sleep, states that in 2014 last SS. he stopped using the CPAP machine around 2 yrs ago due unable to tolerate the machine. pt had questions about insprire.    HPI:  Michael [name] is a 48 y.o. male , and seen on 08-18-2019 upon a new  referral  from Dr. Luciana Axe for a sleep consultation,  As I had described previously Michael [name] had been diagnosed with obstructive sleep apnea at the [facility] sleep center, He underwent a polysomnography on 17 February 2013 the results with an AHI of 32.7/h slight elevation and RDI of 34.3/h, there were more central apneas and obstructive apneas that this is a complex apnea situation.  He did not have PLM's, oxygen nadir was 85% EKG indicated tachycardia for much of the night.  He did not have prolonged hypoxemia.  He returned for a CPAP titration but could not tolerate the [facility] mask which is a full facemask in large size.  He continues to wake up fatigued he has loud and continuous snoring, sometimes he will be choking.  He has often trouble falling asleep, has frequent respiratory visits sometimes morning headaches sometimes nightmares.  He wakes up with a dry mouth usually.  In addition he has been untreated now for several years but has developed more comorbidities these include an ejection fraction reduction to 38% grade 1 diastolic dysfunction, hypertension, he has been using inhalers as needed for wheezing, he has been on 3 or more drugs to control hypertension and he also has a prescription for as needed hydrocodone. He has used this for migraine and he breaks the tablet in half he reports.  There has also been changes in his social history, he was laid off during the  coronavirus pandemic and since May has been unemployed.  He spends more time in bed, he reports insomnia, tossing and turning and some nights he will not go to sleep before 3 AM. His son passed in 2018, killed in a MVA at age 19, he was not the driver. The driver was drunk.   He is interested in seeing if there are alternatives to the CPAP therapy, he has heard about the inspire procedure and device.  However the inspire device has been limited to patients with a BMI of 32 or less and at this time Mr. courts BMI is 44.5.   I would like to add that his blood pressure has been 150s over 111 today in spite of being on a total of 5 blood pressure medications.  Mr. Lazard was seen here on 05/06/2017 for a sleep consultation.  He had on April 5 been seen at primary care at [facility] and was diagnosed with elevated blood pressure, he has been on antihypertensive medication but needed refills at the time. He has a history of asthma, abnormal EKG, malignant hypertension and migraine headaches with aura as well as obstructive sleep apnea. He was diagnosed with OSA over 4 years ago but has not gotten a CPAP. He needed to be re-referred for a new sleep study.  Michael [name] is obese, reports lethargy, insomnia, shortness of breath, headaches, witnessed snoring and has frequent apneas, and is currently not  gainfully employed, he presented here today with his mother [name] and is four-year-old nephew.  Sleep habits are as follows: The patient reports that he sleeps every night in bed but takes longer to go to sleep after he goes into the bedroom. He does not have a set bedtime, his bedtime may average 2 AM, always after midnight. His mother describes that he takes . Naps throughout the day and evening while he watches TV, and he watches TV in the bedroom as well. With the TV is switched off he seems to wake up. He seems to sleep in intervals of the maximal 2 hour duration, and well into the morning hours. For example he may  sleep from 7 AM to 9 AM. He takes diuretics and has to go to the bathroom frequently. He also has nocturia before he takes his diuretic in the morning. He reports off and on waking up with headaches, headaches wake him up, too. He wakes always exhausted, unrefreshed. His TV is now turned off after late night news. ..  Brother has CPAP,   Social history: recently again unemployed, father of 21, children live with their mother- girlfriend snores. No tobacco use, no ETOH use, Caffeine use; Coffee 2 a day, iced tea, Soda 2 a day.     Review of Systems: Out of a complete 14 system review, the patient complains of only the following symptoms, and all other reviewed systems are negative.  How likely are you to doze in the following situations: 0 = not likely, 1 = slight chance, 2 = moderate chance, 3 = high chance  Sitting and Reading? Watching Television? Sitting inactive in a public place (theater or meeting)? Lying down in the afternoon when circumstances permit? Sitting and talking to someone? Sitting quietly after lunch without alcohol? In a car, while stopped for a few minutes in traffic? As a passenger in a car for an hour without a break?  Total = 21/ 24 , FSS at 46/ 63 point.   Epworth score 20 , Fatigue severity score 53 , depression score  He lost his son ( 61 )  in a accident. Grieving.    Social History   Socioeconomic History  . Marital status: Divorced    Spouse name: Not on file  . Number of children: 4  . Years of education: Not on file  . Highest education level: Not on file  Occupational History  . Occupation: Unemployed//disabled  Social Needs  . Financial resource strain: Not on file  . Food insecurity    Worry: Not on file    Inability: Not on file  . Transportation needs    Medical: Not on file    Non-medical: Not on file  Tobacco Use  . Smoking status: Never Smoker  . Smokeless tobacco: Never Used  Substance and Sexual Activity  . Alcohol use: No     Alcohol/week: 0.0 standard drinks  . Drug use: No  . Sexual activity: Yes    Comment: per pt's blue health form - 1 sex partner in last 12 months  Lifestyle  . Physical activity    Days per week: Not on file    Minutes per session: Not on file  . Stress: Not on file  Relationships  . Social Herbalist on phone: Not on file    Gets together: Not on file    Attends religious service: Not on file    Active member of club or organization: Not on file  Attends meetings of clubs or organizations: Not on file    Relationship status: Not on file  . Intimate partner violence    Fear of current or ex partner: Not on file    Emotionally abused: Not on file    Physically abused: Not on file    Forced sexual activity: Not on file  Other Topics Concern  . Not on file  Social History Narrative   Marital status: single; living with fiance      Children: 4 children, no grandchildren.      Employment:  Disability for learning disabilities since 2012.   Right-handed   Caffeine: occasional          Family History  Problem Relation Age of Onset  . Cancer Father        prostate, colon- age was in his 3's  . Heart disease Father 45       MI   . Hypertension Father   . Diabetes Mother   . Hypertension Mother   . Hypertension Brother   . Cancer Maternal Grandfather   . Cancer Paternal Grandfather     Past Medical History:  Diagnosis Date  . Anxiety   . Depression    Questionable per records. Not on any medication.   Marland Kitchen History of kidney stones   . Hx of echocardiogram    a. Echo 12/13/12: Mild LVH, EF 21-19%, grade 1 diastolic dysfunction, mild LAE, mild RVE, mild RAE  . Hypertension   . Insomnia   . Migraine   . Noncompliance   . OSA (obstructive sleep apnea)    a. Sleep Study 02/2013:  mod OSA, AHI 33 per hour, O2 sat nadir 85%    Past Surgical History:  Procedure Laterality Date  . Admission  04/2012   Malignant HTN, HA.  CT head negative, cardiology consult.     . CYSTOSCOPY WITH RETROGRADE PYELOGRAM, URETEROSCOPY AND STENT PLACEMENT Left 01/24/2019   Procedure: CYSTOSCOPY WITH RETROGRADE PYELOGRAM, URETEROSCOPY AND STENT PLACEMENT;  Surgeon: Cleon Gustin, MD;  Location: [facility];  Service: Urology;  Laterality: Left;  . HOLMIUM LASER APPLICATION Left 04/14/4080   Procedure: HOLMIUM LASER APPLICATION;  Surgeon: Cleon Gustin, MD;  Location: [facility];  Service: Urology;  Laterality: Left;  Marland Kitchen MANDIBLE SURGERY  1998   metal plate    Current Outpatient Medications  Medication Sig Dispense Refill  . amLODipine (NORVASC) 10 MG tablet TAKE 1 TABLET BY MOUTH DAILY 90 tablet 0  . buPROPion (WELLBUTRIN XL) 300 MG 24 hr tablet     . carvedilol (COREG) 6.25 MG tablet Take 6.25 mg by mouth 2 (two) times daily.  60 tablet 11  . lisinopril (PRINIVIL,ZESTRIL) 40 MG tablet Take 1 tablet (40 mg total) by mouth daily. (Patient taking differently: Take 50 mg by mouth daily. ) 90 tablet 0  . potassium chloride (K-DUR) 10 MEQ tablet TAKE 1 TABLET(10 MEQ) BY MOUTH DAILY (Patient taking differently: Take 10 mEq by mouth daily. TAKE 1 TABLET(10 MEQ) BY MOUTH DAILY) 30 tablet 0   No current facility-administered medications for this visit.     Allergies as of 08/18/2019 - Review Complete 08/18/2019  Allergen Reaction Noted  . Statins Other (See Comments) 04/14/2017    Vitals: BP (!) 150/111   Pulse 71   Temp (!) 97.5 F (36.4 C)   Ht 5\' 9"  (1.753 m)   Wt (!) 301 lb (136.5 kg)   BMI 44.45 kg/m  Last Weight:  Wt  Readings from Last 1 Encounters:  08/18/19 (!) 301 lb (136.5 kg)   HMC:NOBS mass index is 44.45 kg/m.     Last Height:   Ht Readings from Last 1 Encounters:  08/18/19 5\' 9"  (1.753 m)    Physical exam:  General: The patient is awake, alert and appears not in acute distress.  Head: Normocephalic, atraumatic. Neck is supple. Mallampati 2, no evidence of tonsils . neck circumference:17.75. Nasal airflow  patent  TMJ is not evident . Retrognathia is seen.  Cardiovascular:  Regular rate and rhythm , without  murmurs or carotid bruit, and without distended neck veins. Respiratory: Lungs are clear to auscultation. Skin:  Without evidence of edema, or rash Trunk: BMI is morbidly obese at 44.5 kg/m2 . The patient's posture is erect .  Neurologic exam : The patient is awake and alert, oriented to place and time. He has a lifelong history of learning disabilities.  Memory subjective described as intact. Attention span & concentration ability appears normal. Speech is fluent,  without dysarthria, dysphonia or aphasia. Mood and affect are appropriate.  Cranial nerves: Pupils are equal and briskly reactive to light. Funduscopic exam without  evidence of pallor or edema. Extraocular movements  in vertical and horizontal planes intact and without nystagmus. Visual fields by finger perimetry are intact.Hearing to finger rub intact.  Facial sensation intact to fine touch. Facial motor strength is symmetric and tongue and uvula move midline. Shoulder shrug was symmetrical.   Motor exam: Normal tone, muscle bulk and symmetric strength in all extremities. Sensory:  Fine touch, pinprick and vibration were tested in all extremities. Proprioception tested in the upper extremities was normal. Coordination: Rapid alternating movements in the fingers/hands was normal. Finger-to-nose maneuver  normal without evidence of ataxia, dysmetria or tremor. Gait and station: Patient walks without assistive device and is able unassisted to climb up to the exam table. Strength within normal limits. Stance is stable and normal.   Deep tendon reflexes: in the  upper and lower extremities are symmetric and intact. Babinski maneuver response is downgoing.   MichaelGobin's  original sleep study more than 6 years ago took place at [facility]- He explains that CPAP titration took place but he could not tolerate a full face mask covering mouth  and nose, and felt suffocating. He had been instructed that he needs to use CPAP 4 hours each night and could not comply with this requirement due to intolerance-claustrophobia.  Since he learned that he had apnea he has not been treated in any other fashion for it. He was asked to return for a sleep study but didn't ( 2 years ago0 . I am worried about central sleep apnea, having reviewed the previous study. .   I will order a split night polysomnography with desensitization, my goal is to have the patient not be apprehensive about the mask and to use something that is as small as he can possibly tolerate without sacrificing efficacy.  Assessment:  After physical and neurologic examination, review of laboratory studies,  Personal review of imaging studies, reports of other /same  Imaging studies, results of polysomnography and / or neurophysiology testing and pre-existing records as far as provided in visit., my assessment is ;   1)  Complex Sleep apnea was present in 2014 , will repeat SPLIT-    2)  Insomnia with anxiety- claustrophobia.  He cannot tolerate CPAP- especially not with a FFM, but was never given an alternative.   3)   morbid obesity,  this BMI excludes him for INSPIRE treatment . He is willing to lose weight, but needs guidance.   4)  uncontrolled malignant hypertension. 5 antihypertensive meds at this point    The patient was advised of the nature of the diagnosed disorder , the treatment options and the  risks for general health and wellness arising from not treating the condition.   I spent more than 35 minutes of face to face time with the patient.  Greater than 50% of time was spent in counseling and coordination of care. We have discussed the diagnosis and differential and I answered the patient's questions.    Plan:  Treatment plan and additional workup :  SPLIT night polysomnography with desensitization. I will prescribe a mild sleep aid to allow him to sleep at the  usual PSG start time, latest 10 PM.   Follow up in 2-3 month with NP. Marland Kitchen   Larey Seat, MD 5/78/9784, 78:41 AM  Certified in Neurology by ABPN Certified in [place] by [facility] [address], [place], [place] [address]

## 2019-08-18 NOTE — Patient Instructions (Signed)
Panic Attack A panic attack is a sudden episode of severe anxiety, fear, or discomfort that causes physical and emotional symptoms. The attack may be in response to something frightening, or it may occur for no known reason. Symptoms of a panic attack can be similar to symptoms of a heart attack or stroke. It is important to see your health care provider when you have a panic attack so that these conditions can be ruled out. A panic attack is a symptom of another condition. Most panic attacks go away with treatment of the underlying problem. If you have panic attacks often, you may have a condition called panic disorder. What are the causes? A panic attack may be caused by:  An extreme, life-threatening situation, such as a war or natural disaster.  An anxiety disorder, such as post-traumatic stress disorder.  Depression.  Certain medical conditions, including heart problems, neurological conditions, and infections.  Certain over-the-counter and prescription medicines.  Illegal drugs that increase heart rate and blood pressure, such as methamphetamine.  Alcohol.  Supplements that increase anxiety.  Panic disorder. What increases the risk? You are more likely to develop this condition if:  You have an anxiety disorder.  You have another mental health condition.  You take certain medicines.  You use alcohol, illegal drugs, or other substances.  You are under extreme stress.  A life event is causing increased feelings of anxiety and depression. What are the signs or symptoms? A panic attack starts suddenly, usually lasts about 20 minutes, and occurs with one or more of the following:  A pounding heart.  A feeling that your heart is beating irregularly or faster than normal (palpitations).  Sweating.  Trembling or shaking.  Shortness of breath or feeling smothered.  Feeling choked.  Chest pain or discomfort.  Nausea or a strange feeling in your stomach.   Dizziness, feeling lightheaded, or feeling like you might faint.  Chills or hot flashes.  Numbness or tingling in your lips, hands, or feet.  Feeling confused, or feeling that you are not yourself.  Fear of losing control or being emotionally unstable.  Fear of dying. How is this diagnosed? A panic attack is diagnosed with an assessment by your health care provider. During the assessment your health care provider will ask questions about:  Your history of anxiety, depression, and panic attacks.  Your medical history.  Whether you drink alcohol, use illegal drugs, take supplements, or take medicines. Be honest about your substance use. Your health care provider may also:  Order blood tests or other kinds of tests to rule out serious medical conditions.  Refer you to a mental health professional for further evaluation. How is this treated? Treatment depends on the cause of the panic attack:  If the cause is a medical problem, your health care provider will either treat that problem or refer you to a specialist.  If the cause is emotional, you may be given anti-anxiety medicines or referred to a counselor. These medicines may reduce how often attacks happen, reduce how severe the attacks are, and lower anxiety.  If the cause is a medicine, your health care provider may tell you to stop the medicine, change your dose, or take a different medicine.  If the cause is a drug, treatment may involve letting the drug wear off and taking medicine to help the drug leave your body or to counteract its effects. Attacks caused by drug abuse may continue even if you stop using the drug. Follow these instructions  at home:  Take over-the-counter and prescription medicines only as told by your health care provider.  If you feel anxious, limit your caffeine intake.  Take good care of your physical and mental health by: ? Eating a balanced diet that includes plenty of fresh fruits and vegetables,  whole grains, lean meats, and low-fat dairy. ? Getting plenty of rest. Try to get 7-8 hours of uninterrupted sleep each night. ? Exercising regularly. Try to get 30 minutes of physical activity at least 5 days a week. ? Not smoking. Talk to your health care provider if you need help quitting. ? Limiting alcohol intake to no more than 1 drink a day for nonpregnant women and 2 drinks a day for men. One drink equals 12 oz of beer, 5 oz of wine, or 1 oz of hard liquor.  Keep all follow-up visits as told by your health care provider. This is important. Panic attacks may have underlying physical or emotional problems that take time to accurately diagnose. Contact a health care provider if:  Your symptoms do not improve, or they get worse.  You are not able to take your medicine as prescribed because of side effects. Get help right away if:  You have serious thoughts about hurting yourself or others.  You have symptoms of a panic attack. Do not drive yourself to the hospital. Have someone else drive you or call an ambulance. If you ever feel like you may hurt yourself or others, or you have thoughts about taking your own life, get help right away. You can go to your nearest emergency department or call:  Your local emergency services (911 in the [place]  A suicide crisis helpline, such as the [facility] at 276 628 6330. This is open 24 hours a day. Summary  A panic attack is a sign of a serious health or mental health condition. Get help right away. Do not drive yourself to the hospital. Have someone else drive you or call an ambulance.  Always see a health care provider to have the reasons for the panic attack correctly diagnosed.  If your panic attack was caused by a physical problem, follow your health care provider's suggestions for medicine, referral to a specialist, and lifestyle changes.  If your panic attack was caused by an emotional problem, follow through  with counseling from a qualified mental health specialist.  If you feel like you may hurt yourself or others, call 911 and get help right away. This information is not intended to replace advice given to you by your health care provider. Make sure you discuss any questions you have with your health care provider. Document Released: 12/15/2005 Document Revised: 11/27/2017 Document Reviewed: 01/23/2017 Elsevier Patient Education  2020 [employer]. Insomnia Insomnia is a sleep disorder that makes it difficult to fall asleep or stay asleep. Insomnia can cause fatigue, low energy, difficulty concentrating, mood swings, and poor performance at work or school. There are three different ways to classify insomnia:  Difficulty falling asleep.  Difficulty staying asleep.  Waking up too early in the morning. Any type of insomnia can be long-term (chronic) or short-term (acute). Both are common. Short-term insomnia usually lasts for three months or less. Chronic insomnia occurs at least three times a week for longer than three months. What are the causes? Insomnia may be caused by another condition, situation, or substance, such as:  Anxiety.  Certain medicines.  Gastroesophageal reflux disease (GERD) or other gastrointestinal conditions.  Asthma or other breathing conditions.  Restless legs syndrome, sleep apnea, or other sleep disorders.  Chronic pain.  Menopause.  Stroke.  Abuse of alcohol, tobacco, or illegal drugs.  Mental health conditions, such as depression.  Caffeine.  Neurological disorders, such as Alzheimer's disease.  An overactive thyroid (hyperthyroidism). Sometimes, the cause of insomnia may not be known. What increases the risk? Risk factors for insomnia include:  Gender. Women are affected more often than men.  Age. Insomnia is more common as you get older.  Stress.  Lack of exercise.  Irregular work schedule or working night shifts.  Traveling between  different time zones.  Certain medical and mental health conditions. What are the signs or symptoms? If you have insomnia, the main symptom is having trouble falling asleep or having trouble staying asleep. This may lead to other symptoms, such as:  Feeling fatigued or having low energy.  Feeling nervous about going to sleep.  Not feeling rested in the morning.  Having trouble concentrating.  Feeling irritable, anxious, or depressed. How is this diagnosed? This condition may be diagnosed based on:  Your symptoms and medical history. Your health care provider may ask about: ? Your sleep habits. ? Any medical conditions you have. ? Your mental health.  A physical exam. How is this treated? Treatment for insomnia depends on the cause. Treatment may focus on treating an underlying condition that is causing insomnia. Treatment may also include:  Medicines to help you sleep.  Counseling or therapy.  Lifestyle adjustments to help you sleep better. Follow these instructions at home: Eating and drinking   Limit or avoid alcohol, caffeinated beverages, and cigarettes, especially close to bedtime. These can disrupt your sleep.  Do not eat a large meal or eat spicy foods right before bedtime. This can lead to digestive discomfort that can make it hard for you to sleep. Sleep habits   Keep a sleep diary to help you and your health care provider figure out what could be causing your insomnia. Write down: ? When you sleep. ? When you wake up during the night. ? How well you sleep. ? How rested you feel the next day. ? Any side effects of medicines you are taking. ? What you eat and drink.  Make your bedroom a dark, comfortable place where it is easy to fall asleep. ? Put up shades or blackout curtains to block light from outside. ? Use a white noise machine to block noise. ? Keep the temperature cool.  Limit screen use before bedtime. This includes: ? Watching TV. ? Using  your smartphone, tablet, or computer.  Stick to a routine that includes going to bed and waking up at the same times every day and night. This can help you fall asleep faster. Consider making a quiet activity, such as reading, part of your nighttime routine.  Try to avoid taking naps during the day so that you sleep better at night.  Get out of bed if you are still awake after 15 minutes of trying to sleep. Keep the lights down, but try reading or doing a quiet activity. When you feel sleepy, go back to bed. General instructions  Take over-the-counter and prescription medicines only as told by your health care provider.  Exercise regularly, as told by your health care provider. Avoid exercise starting several hours before bedtime.  Use relaxation techniques to manage stress. Ask your health care provider to suggest some techniques that may work well for you. These may include: ? Breathing exercises. ? Routines to release  muscle tension. ? Visualizing peaceful scenes.  Make sure that you drive carefully. Avoid driving if you feel very sleepy.  Keep all follow-up visits as told by your health care provider. This is important. Contact a health care provider if:  You are tired throughout the day.  You have trouble in your daily routine due to sleepiness.  You continue to have sleep problems, or your sleep problems get worse. Get help right away if:  You have serious thoughts about hurting yourself or someone else. If you ever feel like you may hurt yourself or others, or have thoughts about taking your own life, get help right away. You can go to your nearest emergency department or call:  Your local emergency services (911 in the [place]  A suicide crisis helpline, such as the [facility] at 708 007 3162. This is open 24 hours a day. Summary  Insomnia is a sleep disorder that makes it difficult to fall asleep or stay asleep.  Insomnia can be long-term  (chronic) or short-term (acute).  Treatment for insomnia depends on the cause. Treatment may focus on treating an underlying condition that is causing insomnia.  Keep a sleep diary to help you and your health care provider figure out what could be causing your insomnia. This information is not intended to replace advice given to you by your health care provider. Make sure you discuss any questions you have with your health care provider. Document Released: 12/12/2000 Document Revised: 11/27/2017 Document Reviewed: 09/24/2017 Elsevier Patient Education  2020 [employer].

## 2019-09-15 ENCOUNTER — Ambulatory Visit (INDEPENDENT_AMBULATORY_CARE_PROVIDER_SITE_OTHER): Payer: Medicare Other | Admitting: Neurology

## 2019-09-15 DIAGNOSIS — G4737 Central sleep apnea in conditions classified elsewhere: Secondary | ICD-10-CM

## 2019-09-15 DIAGNOSIS — Z8679 Personal history of other diseases of the circulatory system: Secondary | ICD-10-CM

## 2019-09-15 DIAGNOSIS — I5031 Acute diastolic (congestive) heart failure: Secondary | ICD-10-CM

## 2019-09-15 DIAGNOSIS — G4731 Primary central sleep apnea: Secondary | ICD-10-CM | POA: Diagnosis not present

## 2019-09-15 DIAGNOSIS — I479 Paroxysmal tachycardia, unspecified: Secondary | ICD-10-CM

## 2019-09-15 DIAGNOSIS — F1190 Opioid use, unspecified, uncomplicated: Secondary | ICD-10-CM

## 2019-09-15 DIAGNOSIS — R9431 Abnormal electrocardiogram [ECG] [EKG]: Secondary | ICD-10-CM

## 2019-09-21 ENCOUNTER — Telehealth: Payer: Self-pay | Admitting: Neurology

## 2019-09-21 DIAGNOSIS — Z8679 Personal history of other diseases of the circulatory system: Secondary | ICD-10-CM | POA: Insufficient documentation

## 2019-09-21 DIAGNOSIS — Z6841 Body Mass Index (BMI) 40.0 and over, adult: Secondary | ICD-10-CM | POA: Insufficient documentation

## 2019-09-21 DIAGNOSIS — I5031 Acute diastolic (congestive) heart failure: Secondary | ICD-10-CM | POA: Insufficient documentation

## 2019-09-21 DIAGNOSIS — G4737 Central sleep apnea in conditions classified elsewhere: Secondary | ICD-10-CM | POA: Insufficient documentation

## 2019-09-21 NOTE — Telephone Encounter (Signed)
Called patient to discuss sleep study results. No answer at this time. LVM for the patient to call back.   

## 2019-09-21 NOTE — Telephone Encounter (Signed)
-----   Message from Larey Seat, MD sent at 09/21/2019  9:13 AM EDT ----- EKG was highly irregular - see screen shots attached to tech  report,    IMPRESSION: Please review the attached screen shots. Epoch  539.559, 568, 583.   1. Dominantly Central Sleep Apnea AHI 123456, with cyclic  breathing, and irregular EKG, and prolonged hypoxemia can be  opiate induced/).  2. Prolonged hypoxemia with a Total Desaturation time of 126  minutes, Nadir SpO2 was 76%.  3. Abnormal EKG.    RECOMMENDATIONS:   1. Advise urgently a full-night, attended, PAP titration study to  optimize therapy. This patient may not respond to CPAP, may be  needs BiPAP , ST or ASV.

## 2019-09-21 NOTE — Procedures (Signed)
PATIENT'S NAME:  Michael [name], Michael [name] DOB:      [birth year]      MR#:    [id number]     DATE OF RECORDING: 09/15/2019 [facility] REFERRING M.D.:  Precious Haws, MD Study Performed:   Baseline Polysomnogram HISTORY:  Michael [name] is a 48 y.o. male patient seen on 08-18-2019 upon a new referral from Dr. Luciana Axe for a sleep consultation. He continues to wake up fatigued he has loud and continuous snoring, sometimes he will be choking.  He has often trouble falling asleep, has frequent respiratory visits sometimes morning headaches sometimes nightmares.  He wakes up with a dry mouth usually.   In addition he has been untreated now for several years but has developed more comorbidities these include an ejection fraction reduction to [id number] grade 1 diastolic dysfunction, hypertension, he has been using inhalers as needed for wheezing, he has been on 5 drugs to control hypertension and he also has a prescription for as needed hydrocodone. He has used this for migraine he reports.  As I had described previously Michael [name] had been diagnosed with obstructive sleep apnea at the [facility] underwent a polysomnography on 17 February 2013 the results with an AHI of 32.7/h slight elevation and RDI of 34.3/h, there were more central apneas than obstructive apneas -this is a complex apnea situation. He did not have PLM's, his oxygen nadir was 85%. EKG indicated tachycardia for much of the night. He did not have prolonged hypoxemia.  He returned for a CPAP titration but could not tolerate the ResMed Mirage Quattro mask which is a full facemask in large size. There has also been changes in his social history, he was laid off during the coronavirus pandemic and since May has been unemployed.  He spends more time in bed, he reports insomnia, tossing and turning and some nights he will not go to sleep before 3 AM. His son passed in 2018, killed in a MVA at age 71, he was not the driver. The driver was drunk.  He is interested in seeing if  there are alternatives to the CPAP therapy, he has heard about the inspire procedure and device.  However the inspire device has been limited to patients with a BMI of 32 or less and at this time Michael [name] BMI is currently too high at 44.5 kg/m2.  PS :  I would like to add that his systolic blood pressure has been 150s / 111 mmHg in spite of being on a total of 5 blood pressure medications.  The patient endorsed the Epworth Sleepiness Scale at 21/24 points.   The patient's weight 301 pounds with a height of 69 (inches), resulting in a BMI of 44.7 kg/m2. The patient's neck circumference measured 17.8 inches.  CURRENT MEDICATIONS: Norvasc, Wellbutrin XL, Coreg, Prinivil, K-Dur.   PROCEDURE:  This is a multichannel digital polysomnogram utilizing the Somnostar 11.2 system.  Electrodes and sensors were applied and monitored per AASM Specifications.   EEG, EOG, Chin and Limb EMG, were sampled at 200 Hz.  ECG, Snore and Nasal Pressure, Thermal Airflow, Respiratory Effort, CPAP Flow and Pressure, Oximetry was sampled at 50 Hz. Digital video and audio were recorded.       BASELINE STUDY  Lights Out was at 21:23 and Lights On at 04:55.  Total recording time (TRT) was 452.5 minutes, with a total sleep time (TST) of 330 minutes.   The patient's sleep latency was 102 minutes.  REM latency was 50.5 minutes.  The sleep  efficiency was 72.9 %.     SLEEP ARCHITECTURE: WASO (Wake after sleep onset) was 23.5 minutes.  There were 4 minutes in Stage N1, 189.5 minutes Stage N2, 57 minutes Stage N3 and 79.5 minutes in Stage REM.  The percentage of Stage N1 was 1.2%, Stage N2 was 57.4%, Stage N3 was 17.3% and Stage R (REM sleep) was 24.1%.   RESPIRATORY ANALYSIS:  There were a total of 360 respiratory events:  78 obstructive apneas, 200 central apneas and 82 hypopneas. These central apneas appear in a cyclic fashion, Cheyne -Stokes Respiration. The total APNEA/HYPOPNEA INDEX (AHI) was 65.5/hour.  110 events occurred in  REM sleep and 287 events in NREM. The REM AHI was 83.0 /hour, versus a non-REM AHI of 59.9. The patient spent 115.5 minutes of total sleep time in the supine position and 215 minutes in non-supine. The supine AHI was 94.5 versus a non-supine AHI of 49.8.  OXYGEN SATURATION & C02:  The Wake baseline 02 saturation was 94%, with the lowest being 76%. Time spent below 89% saturation equaled 126 minutes.  The arousals were noted as: 41 were spontaneous, 0 were associated with PLMs, and 151 were associated with respiratory events.  The patient had a total of 0 Periodic Limb Movements No unusual movements, behaviors, phonations or vocalizations were recorded.   Snoring was noted.  EKG was highly irregular - see screen shots attached to tech report,    IMPRESSION: Please review the attached screen shots. Epoch 539.559, 568, 583.   1. Dominantly Central Sleep Apnea AHI 123456, with cyclic breathing, and irregular EKG, and prolonged hypoxemia  can be opiate induced/). 2. Prolonged hypoxemia with a Total Desaturation time of 126 minutes, Nadir SpO2 was 76%.  3. Abnormal EKG.   RECOMMENDATIONS:  1. Advise urgently a full-night, attended, PAP titration study to optimize therapy.  This patient may not respond to CPAP, may be needs BiPAP , ST or ASV.    I certify that I have reviewed the entire raw data recording prior to the issuance of this report in accordance with the Standards of Accreditation of the American Academy of Sleep Medicine (AASM)      Larey Seat, MD  09-21-2019 Diplomat, American Board of Psychiatry and Neurology  Diplomat, American Board of [place] Director, [facility] [employer]

## 2019-09-21 NOTE — Addendum Note (Signed)
Addended by: Larey Seat on: 09/21/2019 09:13 AM   Modules accepted: Orders

## 2019-09-22 NOTE — Telephone Encounter (Signed)

## 2019-09-22 NOTE — Telephone Encounter (Signed)
Pt returned call. Please call back when available. 

## 2019-10-11 ENCOUNTER — Ambulatory Visit: Payer: Medicare Other | Admitting: Neurology

## 2019-10-17 ENCOUNTER — Other Ambulatory Visit (HOSPITAL_COMMUNITY)
Admission: RE | Admit: 2019-10-17 | Discharge: 2019-10-17 | Disposition: A | Discharge status: Home / Self Care | Payer: Medicare Other | Source: Ambulatory Visit | Attending: Neurology | Admitting: Neurology

## 2019-10-17 DIAGNOSIS — Z20828 Contact with and (suspected) exposure to other viral communicable diseases: Secondary | ICD-10-CM | POA: Diagnosis not present

## 2019-10-17 DIAGNOSIS — Z01812 Encounter for preprocedural laboratory examination: Secondary | ICD-10-CM | POA: Insufficient documentation

## 2019-10-17 LAB — SARS CORONAVIRUS 2 (TAT 6-24 HRS): SARS Coronavirus 2: NEGATIVE

## 2019-10-19 ENCOUNTER — Other Ambulatory Visit: Payer: Self-pay

## 2019-10-19 ENCOUNTER — Ambulatory Visit (INDEPENDENT_AMBULATORY_CARE_PROVIDER_SITE_OTHER): Payer: Medicare Other | Admitting: Neurology

## 2019-10-19 DIAGNOSIS — G4733 Obstructive sleep apnea (adult) (pediatric): Secondary | ICD-10-CM

## 2019-10-19 DIAGNOSIS — F1190 Opioid use, unspecified, uncomplicated: Secondary | ICD-10-CM

## 2019-10-19 DIAGNOSIS — I5031 Acute diastolic (congestive) heart failure: Secondary | ICD-10-CM

## 2019-10-19 DIAGNOSIS — G4737 Central sleep apnea in conditions classified elsewhere: Secondary | ICD-10-CM

## 2019-10-19 DIAGNOSIS — R9431 Abnormal electrocardiogram [ECG] [EKG]: Secondary | ICD-10-CM

## 2019-10-20 NOTE — Procedures (Signed)
Michael [name] is a 48 y.o. male patient. 1. Central sleep apnea comorbid with prescribed opioid use   2. Abnormal EKG   3. CHF (congestive heart failure), NYHA class I, acute, diastolic ([place])   4. Morbid obesity with body mass index (BMI) of 40.0 to 44.9 in adult [facility])    Past Medical History:  Diagnosis Date  . Anxiety   . Depression    Questionable per records. Not on any medication.   Marland Kitchen History of kidney stones   . Hx of echocardiogram    a. Echo 12/13/12: Mild LVH, EF 99991111, grade 1 diastolic dysfunction, mild LAE, mild RVE, mild RAE  . Hypertension   . Insomnia   . Migraine   . Noncompliance   . OSA (obstructive sleep apnea)    a. Sleep Study 02/2013:  mod OSA, AHI 33 per hour, O2 sat nadir 85%   There were no vitals taken for this visit.  Procedures  Beau-Jacques C [place] Howse 10/20/2019

## 2019-10-22 NOTE — Procedures (Signed)
PATIENT'S NAME:  Michael [name], Michael [name] DOB:      July 15, 1971      MR#:    [id number]     DATE OF RECORDING: 10/19/2019 REFERRING M.D.:  Precious Haws, MD Study Performed: Titration to Positive Airway Pressure  HISTORY:  Mr. Rood returns after his Baseline study from 09-15-2019 documented an AHI of 65.5/h REM AHI of 83/h and supine AHI was 94/h. Concerning were further an Oxygen saturation Nadir of 76%. Time spent below 89% saturation equaled126 minutes. The patient endorsed the Epworth Sleepiness Scale at 21 points.   The patient's weight 301 pounds with a height of 69 (inches), resulting in a BMI of 44.7 kg/m2. The patient's neck circumference measured 17.8 inches.  CURRENT MEDICATIONS: Norvasc, Wellbutrin XL, Coreg, Prinivil, K-Dur.   PROCEDURE:  This is a multichannel digital polysomnogram utilizing the SomnoStar 11.2 system.  Electrodes and sensors were applied and monitored per AASM Specifications.   EEG, EOG, Chin and Limb EMG, were sampled at 200 Hz.  ECG, Snore and Nasal Pressure, Thermal Airflow, Respiratory Effort, CPAP Flow and Pressure, Oximetry was sampled at 50 Hz. Digital video and audio were recorded.      CPAP was initiated at 5.0 cmH20 with heated humidity per AASM split night standards and pressure was advanced to 15 cmH20. Because of a persistent AHI of 6.7/h the modality was changed to BiPAP_ 16/12 and explored through 18/13 cm water where an AHI of 0.8/h was reached.  At that PAP pressure observed were a reduction of the AHI to 0.8, the 02 nadir of 90% and total sleep time of 75 minutes, with improvement of sleep apnea. The patient was fitted with a ResMed N20 nasal mask in large. Lights Out was at 20:57 and Lights On at 04:15. Total recording time (TRT) was 438.5 minutes, with a total sleep time (TST) of 276.5 minutes. The patient's sleep latency was 135.5 minutes. REM latency was 238.5 minutes.  The sleep efficiency was 63.1 %.    SLEEP ARCHITECTURE: WASO (Wake after sleep onset) was  148.5 minutes.  There were 52 minutes in Stage N1, 151 minutes Stage N2, 41 minutes Stage N3 and 32.5 minutes in Stage REM.  The percentage of Stage N1 was 18.8%, Stage N2 was 54.6%, Stage N3 was 14.8% and Stage R (REM sleep) was 11.8%. The sleep architecture was notable for REM rebound only under BiPAP therapy.  RESPIRATORY ANALYSIS:  There was a total of 60 respiratory events: 0 apneas and 60 hypopneas with 0 respiratory event related arousals (RERAs). The total APNEA/HYPOPNEA INDEX (AHI) was 13.0 /hour and the total RESPIRATORY DISTURBANCE INDEX was 13.0 /hour. 4 events occurred in REM sleep and 56 events in NREM. The REM AHI was 7.4 /hour versus a non-REM AHI of 13.8 /hour.  The patient spent 98 minutes of total sleep time in the supine position and 179 minutes in non-supine. The supine AHI was 19.6, versus a non-supine AHI of 9.4/h.  OXYGEN SATURATION & C02:  The baseline 02 saturation was 95%, with the lowest being 88%. Time spent below 89% saturation equaled 0 minutes.  The arousals were noted as: 19 were spontaneous, 0 were associated with PLMs, and 31 were associated with respiratory events. The patient had a total of 0 Periodic Limb Movements. Audio and video analysis did not show any abnormal or unusual movements, behaviors, phonations or vocalizations.   The patient took bathroom breaks. Snoring was noted and was difficult to control. Oral venting of air was noted.  EKG was irregular-  see screen shot Epoch 669- with variable R to R intervals.  DIAGNOSIS: Severe Obstructive Sleep Apnea was controlled under BIPAP of 18/13 cm water.  Sleep Related Hypoxemia resolved, most snoring did also. Abnormal EKG persisted.  PLANS/RECOMMENDATIONS: The patient was fitted with a ResMed N20 nasal mask in large. We will order a BiPAP auto-function device with a pressure setting of 16/11cm water through 20/15 cm water. The data shall be reviewed in 60-90 days and the machine can hopefully be set to one  pressure - unless the patient has no issue with comfort.  The mask was a compromise - FFM not tolerated at this point.    DISCUSSION: Follow up with NP in our sleep clinic, contact DME with any technical questions. A follow up appointment will be scheduled in the Sleep Clinic at [facility].   Please call [phone] with any questions.      I certify that I have reviewed the entire raw data recording prior to the issuance of this report in accordance with the Standards of Accreditation of the American Academy of Sleep Medicine (AASM)   Larey Seat, M.D. Diplomat, Tax adviser of Psychiatry and Neurology  Diplomat, Tax adviser of Sleep Medicine Market researcher, [employer] [facility] at [employer]

## 2019-10-22 NOTE — Addendum Note (Signed)
Addended by: Larey Seat on: 10/22/2019 02:08 PM   Modules accepted: Orders

## 2019-10-25 ENCOUNTER — Telehealth: Payer: Self-pay | Admitting: Neurology

## 2019-10-25 NOTE — Telephone Encounter (Signed)
-----   Message from Larey Seat, MD sent at 10/22/2019  2:08 PM EDT -----   EKG was irregular- see screen shot Epoch 669- with variable R to  R intervals.   DIAGNOSIS: Severe Obstructive Sleep Apnea was controlled under  BIPAP of 18/13 cm water.  Sleep Related Hypoxemia resolved, most snoring did also. Abnormal  EKG persisted. The patient was fitted with a ResMed N20 nasal mask in large. The mask was a compromise - FFM may not be tolerated at this point/ patient with retrognathia.    We will order a BiPAP auto-function device with a pressure setting of 16/11cm water through 20/15 cm water. The data shall be reviewed in 60-90 days and the machine can hopefully be set to one pressure - unless the patient has no issue with comfort.

## 2019-10-25 NOTE — Telephone Encounter (Signed)
I called pt. I advised pt that Dr. Brett Fairy reviewed their sleep study results and found that pt has sleep apnea that was best treated with a BiPAP. Dr. Brett Fairy recommends that pt start BiPAP. I reviewed PAP compliance expectations with the pt. Pt is agreeable to starting a CPAP. I advised pt that an order will be sent to a DME, [facility], and [facility] will call the pt within about one week after they file with the pt's insurance. [facility] will show the pt how to use the machine, fit for masks, and troubleshoot the CPAP if needed. A follow up appt was made for insurance purposes with Debbora Presto, NP on Jan 11,2020 at 1:30 pm. Pt verbalized understanding to arrive 15 minutes early and bring their CPAP. A letter with all of this information in it will be mailed to the pt as a reminder. I verified with the pt that the address we have on file is correct. Pt verbalized understanding of results. Pt had no questions at this time but was encouraged to call back if questions arise. I have sent the order to [facility] and have received confirmation that they have received the order.

## 2019-12-29 ENCOUNTER — Emergency Department (HOSPITAL_COMMUNITY)
Admission: EM | Admit: 2019-12-29 | Discharge: 2019-12-29 | Disposition: A | Discharge status: Home / Self Care | Payer: Medicare Other | Attending: Emergency Medicine | Admitting: Emergency Medicine

## 2019-12-29 DIAGNOSIS — I5032 Chronic diastolic (congestive) heart failure: Secondary | ICD-10-CM | POA: Diagnosis not present

## 2019-12-29 DIAGNOSIS — Z202 Contact with and (suspected) exposure to infections with a predominantly sexual mode of transmission: Secondary | ICD-10-CM | POA: Insufficient documentation

## 2019-12-29 DIAGNOSIS — J45909 Unspecified asthma, uncomplicated: Secondary | ICD-10-CM | POA: Insufficient documentation

## 2019-12-29 DIAGNOSIS — Z7689 Persons encountering health services in other specified circumstances: Secondary | ICD-10-CM

## 2019-12-29 DIAGNOSIS — I110 Hypertensive heart disease with heart failure: Secondary | ICD-10-CM | POA: Insufficient documentation

## 2019-12-29 DIAGNOSIS — Z79899 Other long term (current) drug therapy: Secondary | ICD-10-CM | POA: Insufficient documentation

## 2019-12-29 LAB — URINALYSIS, ROUTINE W REFLEX MICROSCOPIC
Bilirubin Urine: NEGATIVE
Glucose, UA: NEGATIVE mg/dL
Hgb urine dipstick: NEGATIVE
Ketones, ur: NEGATIVE mg/dL
Leukocytes,Ua: NEGATIVE
Nitrite: NEGATIVE
Protein, ur: NEGATIVE mg/dL
Specific Gravity, Urine: 1.02 (ref 1.005–1.030)
pH: 6 (ref 5.0–8.0)

## 2019-12-29 NOTE — ED Notes (Signed)
Pt states his "lady friend returned from a trip to [place] and was c/o vaginal discharge", blaming pt for her symptoms. Pt wants to be checked for STI. Denies any discharge, pain with urination or abd pain. Pt is alert and oriented at time of triage.

## 2019-12-29 NOTE — Discharge Instructions (Signed)
Based on your concern of a possible exposure to someone with a sickly transmitted infection, we sent off a urine for analysis, culture, and testing for gonorrhea and chlamydia.  Please follow-up on my chart for these results.  As you have no symptoms and do not suspect you were exposed, we are holding on antibiotics at this time however if you develop any symptoms, please return to the nearest emergency department for likely antibiotics.  I anticipate you would be called if anything was positive however just to be safe, please check on my chart tomorrow to see what your results show.

## 2019-12-29 NOTE — ED Notes (Signed)
Patient Alert and oriented to baseline. Stable and ambulatory to baseline. Patient verbalized understanding of the discharge instructions.  Patient belongings were taken by the patient.   

## 2019-12-29 NOTE — ED Provider Notes (Signed)
[facility] EMERGENCY DEPARTMENT Provider Note   CSN: [id number] Arrival date & time: 12/29/19  1023     History Chief Complaint  Patient presents with  . STI check    Michael [name] is a 48 y.o. male.  The history is provided by the patient and medical records.  Exposure to STD This is a new problem. Episode onset: unknown. The problem has not changed since onset.Pertinent negatives include no chest pain, no abdominal pain, no headaches and no shortness of breath. Nothing aggravates the symptoms. Nothing relieves the symptoms. He has tried nothing for the symptoms. The treatment provided no relief.       Past Medical History:  Diagnosis Date  . Anxiety   . Depression    Questionable per records. Not on any medication.   Marland Kitchen History of kidney stones   . Hx of echocardiogram    a. Echo 12/13/12: Mild LVH, EF 99991111, grade 1 diastolic dysfunction, mild LAE, mild RVE, mild RAE  . Hypertension   . Insomnia   . Migraine   . Noncompliance   . OSA (obstructive sleep apnea)    a. Sleep Study 02/2013:  mod OSA, AHI 33 per hour, O2 sat nadir 85%    Patient Active Problem List   Diagnosis Date Noted  . Morbid obesity with body mass index (BMI) of 40.0 to 44.9 in adult [facility]) 09/21/2019  . CHF (congestive heart failure), NYHA class I, acute, diastolic ([place]) [id number]  . Central sleep apnea comorbid with prescribed opioid use 09/21/2019  . History of uncontrolled hypertension 09/21/2019  . Complex sleep apnea syndrome 08/18/2019  . Claustrophobia 08/18/2019  . Anxiety associated with depression 08/18/2019  . Hypokalemia 04/04/2017  . Morbid obesity ([place]) 04/04/2017  . Chest wall pain 04/04/2017  . Asthma 02/13/2016  . Non compliance w medication regimen 06/13/2015  . OSA (obstructive sleep apnea) 04/06/2013  . Abnormal EKG 11/08/2012  . Essential hypertension   . HTN (hypertension), malignant 04/29/2012  . Migraine headache with aura 04/29/2012    Past  Surgical History:  Procedure Laterality Date  . Admission  04/2012   Malignant HTN, HA.  CT head negative, cardiology consult.    . CYSTOSCOPY WITH RETROGRADE PYELOGRAM, URETEROSCOPY AND STENT PLACEMENT Left 01/24/2019   Procedure: CYSTOSCOPY WITH RETROGRADE PYELOGRAM, URETEROSCOPY AND STENT PLACEMENT;  Surgeon: Cleon Gustin, MD;  Location: [facility];  Service: Urology;  Laterality: Left;  . HOLMIUM LASER APPLICATION Left [id number]   Procedure: HOLMIUM LASER APPLICATION;  Surgeon: Cleon Gustin, MD;  Location: [facility];  Service: Urology;  Laterality: Left;  Marland Kitchen MANDIBLE SURGERY  1998   metal plate       Family History  Problem Relation Age of Onset  . Cancer Father        prostate, colon- age was in his 45's  . Heart disease Father 79       MI   . Hypertension Father   . Diabetes Mother   . Hypertension Mother   . Hypertension Brother   . Cancer Maternal Grandfather   . Cancer Paternal Grandfather     Social History   Tobacco Use  . Smoking status: Never Smoker  . Smokeless tobacco: Never Used  Substance Use Topics  . Alcohol use: No    Alcohol/week: 0.0 standard drinks  . Drug use: No    Home Medications Prior to Admission medications   Medication Sig Start Date End Date Taking? Authorizing Provider  amLODipine (  NORVASC) 10 MG tablet TAKE 1 TABLET BY MOUTH DAILY 04/03/17   Tenna Delaine D, PA-C  buPROPion (WELLBUTRIN XL) 300 MG 24 hr tablet  06/16/19   [provider]  carvedilol (COREG) 6.25 MG tablet Take 6.25 mg by mouth 2 (two) times daily.  05/11/17   Sueanne Margarita, MD  lisinopril (PRINIVIL,ZESTRIL) 40 MG tablet Take 1 tablet (40 mg total) by mouth daily. Patient taking differently: Take 50 mg by mouth daily.  04/03/17   Tenna Delaine D, PA-C  potassium chloride (K-DUR) 10 MEQ tablet TAKE 1 TABLET(10 MEQ) BY MOUTH DAILY Patient taking differently: Take 10 mEq by mouth daily. TAKE 1 TABLET(10 MEQ) BY MOUTH  DAILY 12/23/16   [name], MD  traZODone (DESYREL) 50 MG tablet Take 1 tablet (50 mg total) by mouth at bedtime. 08/18/19   Dohmeier, Asencion Partridge, MD    Allergies    Statins  Review of Systems   Review of Systems  Constitutional: Negative for chills, fatigue and fever.  HENT: Negative for congestion.   Respiratory: Negative for cough, chest tightness, shortness of breath and wheezing.   Cardiovascular: Negative for chest pain and palpitations.  Gastrointestinal: Negative for abdominal pain, constipation, diarrhea, nausea and vomiting.  Genitourinary: Negative for flank pain and frequency.  Musculoskeletal: Negative for back pain, neck pain and neck stiffness.  Skin: Negative for rash and wound.  Neurological: Negative for light-headedness and headaches.  Psychiatric/Behavioral: Negative for agitation and confusion.  All other systems reviewed and are negative.   Physical Exam Updated Vital Signs Temp 98 F (36.7 C) (Oral)   Physical Exam Vitals and nursing note reviewed.  Constitutional:      General: He is not in acute distress.    Appearance: He is not ill-appearing, toxic-appearing or diaphoretic.  HENT:     Head: Normocephalic.     Nose: No congestion.  Eyes:     Conjunctiva/sclera: Conjunctivae normal.     Pupils: Pupils are equal, round, and reactive to light.  Cardiovascular:     Rate and Rhythm: Normal rate.     Pulses: Normal pulses.     Heart sounds: No murmur.  Pulmonary:     Effort: Pulmonary effort is normal.     Breath sounds: No wheezing, rhonchi or rales.  Chest:     Chest wall: No tenderness.  Abdominal:     General: Abdomen is flat. There is no distension.     Tenderness: There is no abdominal tenderness.  Genitourinary:    Comments: Patient refused GU exam Musculoskeletal:        General: No tenderness.  Skin:    Capillary Refill: Capillary refill takes less than 2 seconds.     Findings: No erythema or rash.  Neurological:     Mental  Status: He is alert and oriented to person, place, and time.  Psychiatric:        Mood and Affect: Mood normal.     ED Results / Procedures / Treatments   Labs (all labs ordered are listed, but only abnormal results are displayed) Labs Reviewed  URINE CULTURE  URINALYSIS, ROUTINE W REFLEX MICROSCOPIC  GC/CHLAMYDIA PROBE AMP ([place]) NOT AT [facility]    EKG None  Radiology No results found.  Procedures Procedures (including critical care time)  Medications Ordered in ED Medications - No data to display  ED Course  I have reviewed the triage vital signs and the nursing notes.  Pertinent labs & imaging results that were available during my  care of the patient were reviewed by me and considered in my medical decision making (see chart for details).    MDM Rules/Calculators/A&P                      Trenden Buchner is a 48 y.o. male with a past medical history significant for hypertension, migraines, obesity, and congestive heart failure who presents for possible STI exposure.  Patient reports that his girlfriend claimed that her doctor told her she may have an STI due to vaginal discharge and she told the patient that he must have an infection given to her.  Patient reports that he has not had any other sexual encounters with anyone else in the last year and has never had any symptoms whatsoever.  He denies any penile pain, scrotal pain, swelling, discharge, rashes, abdominal pain, fevers, chills, nausea, vomiting, or any symptoms whatsoever.  He has no history of STI.  He came to the emergency department to be tested so that he can tell her he did not have any infection.  He denies other complaints.  On exam, lungs clear and chest is nontender.  Abdomen is nontender.  Patient deferred GU exam given his lack of any penile or GU symptoms.  No other complaints on exam.  Patient will have urine sent off for urinalysis, culture to look for trichomonas or other infection and will also have  GC/committee a test sent.  Patient will follow up on my chart for the results.  As he has no symptoms and it is unclear exposure, we will hold on antibiotics at this time until anything returns positive.  Patient will check on my chart to see and will follow with a PCP.  He understands that he develops any symptoms he should return to the nearest emergency department.  He had no other questions or concerns and was discharged in good condition after specimen collection.   Final Clinical Impression(s) / ED Diagnoses Final diagnoses:  Encounter for assessment of sexually transmitted disease exposure    Rx / DC Orders ED Discharge Orders    None      Clinical Impression: 1. Encounter for assessment of sexually transmitted disease exposure     Disposition: Discharge  Condition: Good  I have discussed the results, Dx and Tx plan with the pt(& family if present). He/she/they expressed understanding and agree(s) with the plan. Discharge instructions discussed at great length. Strict return precautions discussed and pt &/or family have verbalized understanding of the instructions. No further questions at time of discharge.    New Prescriptions   No medications on file    Follow Up: [place] [address] [place] [phone] Schedule an appointment as soon as possible for a visit    [place] [address] [id number] mc [place]       Jasmon Mattice, Gwenyth Allegra, MD 12/29/19 2116

## 2019-12-30 LAB — URINE CULTURE: Culture: NO GROWTH

## 2020-01-09 ENCOUNTER — Other Ambulatory Visit: Payer: Self-pay

## 2020-01-09 ENCOUNTER — Encounter: Payer: Self-pay | Admitting: Family Medicine

## 2020-01-09 ENCOUNTER — Ambulatory Visit (INDEPENDENT_AMBULATORY_CARE_PROVIDER_SITE_OTHER): Payer: Medicare HMO | Admitting: Family Medicine

## 2020-01-09 VITALS — BP 154/98 | HR 87 | Temp 97.0°F | Ht 69.0 in | Wt 302.4 lb

## 2020-01-09 DIAGNOSIS — G4733 Obstructive sleep apnea (adult) (pediatric): Secondary | ICD-10-CM

## 2020-01-09 NOTE — Patient Instructions (Signed)
We will order a mask refitting   Continue working on nightly use and greater than 4 hours each night   Follow up in 8 weeks    CPAP and BPAP Information CPAP and BPAP are methods of helping a person breathe with the use of air pressure. CPAP stands for "continuous positive airway pressure." BPAP stands for "bi-level positive airway pressure." In both methods, air is blown through your nose or mouth and into your air passages to help you breathe well. CPAP and BPAP use different amounts of pressure to blow air. With CPAP, the amount of pressure stays the same while you breathe in and out. With BPAP, the amount of pressure is increased when you breathe in (inhale) so that you can take larger breaths. Your health care provider will recommend whether CPAP or BPAP would be more helpful for you. Why are CPAP and BPAP treatments used? CPAP or BPAP can be helpful if you have:  Sleep apnea.  Chronic obstructive pulmonary disease (COPD).  Heart failure.  Medical conditions that weaken the muscles of the chest including muscular dystrophy, or neurological diseases such as amyotrophic lateral sclerosis (ALS).  Other problems that cause breathing to be weak, abnormal, or difficult. CPAP is most commonly used for obstructive sleep apnea (OSA) to keep the airways from collapsing when the muscles relax during sleep. How is CPAP or BPAP administered? Both CPAP and BPAP are provided by a small machine with a flexible plastic tube that attaches to a plastic mask. You wear the mask. Air is blown through the mask into your nose or mouth. The amount of pressure that is used to blow the air can be adjusted on the machine. Your health care provider will determine the pressure setting that should be used based on your individual needs. When should CPAP or BPAP be used? In most cases, the mask only needs to be worn during sleep. Generally, the mask needs to be worn throughout the night and during any daytime  naps. People with certain medical conditions may also need to wear the mask at other times when they are awake. Follow instructions from your health care provider about when to use the machine. What are some tips for using the mask?   Because the mask needs to be snug, some people feel trapped or closed-in (claustrophobic) when first using the mask. If you feel this way, you may need to get used to the mask. One way to do this is by holding the mask loosely over your nose or mouth and then gradually applying the mask more snugly. You can also gradually increase the amount of time that you use the mask.  Masks are available in various types and sizes. Some fit over your mouth and nose while others fit over just your nose. If your mask does not fit well, talk with your health care provider about getting a different one.  If you are using a mask that fits over your nose and you tend to breathe through your mouth, a chin strap may be applied to help keep your mouth closed.  The CPAP and BPAP machines have alarms that may sound if the mask comes off or develops a leak.  If you have trouble with the mask, it is very important that you talk with your health care provider about finding a way to make the mask easier to tolerate. Do not stop using the mask. Stopping the use of the mask could have a negative impact on your  health. What are some tips for using the machine?  Place your CPAP or BPAP machine on a secure table or stand near an electrical outlet.  Know where the on/off switch is located on the machine.  Follow instructions from your health care provider about how to set the pressure on your machine and when you should use it.  Do not eat or drink while the CPAP or BPAP machine is on. Food or fluids could get pushed into your lungs by the pressure of the CPAP or BPAP.  Do not smoke. Tobacco smoke residue can damage the machine.  For home use, CPAP and BPAP machines can be rented or purchased  through home health care companies. Many different brands of machines are available. Renting a machine before purchasing may help you find out which particular machine works well for you.  Keep the CPAP or BPAP machine and attachments clean. Ask your health care provider for specific instructions. Get help right away if:  You have redness or open areas around your nose or mouth where the mask fits.  You have trouble using the CPAP or BPAP machine.  You cannot tolerate wearing the CPAP or BPAP mask.  You have pain, discomfort, and bloating in your abdomen. Summary  CPAP and BPAP are methods of helping a person breathe with the use of air pressure.  Both CPAP and BPAP are provided by a small machine with a flexible plastic tube that attaches to a plastic mask.  If you have trouble with the mask, it is very important that you talk with your health care provider about finding a way to make the mask easier to tolerate. This information is not intended to replace advice given to you by your health care provider. Make sure you discuss any questions you have with your health care provider. Document Revised: 04/06/2019 Document Reviewed: 11/03/2016 Elsevier Patient Education  [place].   Sleep Apnea Sleep apnea affects breathing during sleep. It causes breathing to stop for a short time or to become shallow. It can also increase the risk of:  Heart attack.  Stroke.  Being very overweight (obese).  Diabetes.  Heart failure.  Irregular heartbeat. The goal of treatment is to help you breathe normally again. What are the causes? There are three kinds of sleep apnea:  Obstructive sleep apnea. This is caused by a blocked or collapsed airway.  Central sleep apnea. This happens when the brain does not send the right signals to the muscles that control breathing.  Mixed sleep apnea. This is a combination of obstructive and central sleep apnea. The most common cause of this  condition is a collapsed or blocked airway. This can happen if:  Your throat muscles are too relaxed.  Your tongue and tonsils are too large.  You are overweight.  Your airway is too small. What increases the risk?  Being overweight.  Smoking.  Having a small airway.  Being older.  Being male.  Drinking alcohol.  Taking medicines to calm yourself (sedatives or tranquilizers).  Having family members with the condition. What are the signs or symptoms?  Trouble staying asleep.  Being sleepy or tired during the day.  Getting angry a lot.  Loud snoring.  Headaches in the morning.  Not being able to focus your mind (concentrate).  Forgetting things.  Less interest in sex.  Mood swings.  Personality changes.  Feelings of sadness (depression).  Waking up a lot during the night to pee (urinate).  Dry mouth.  Sore throat. How is this diagnosed?  Your medical history.  A physical exam.  A test that is done when you are sleeping (sleep study). The test is most often done in a sleep lab but may also be done at home. How is this treated?   Sleeping on your side.  Using a medicine to get rid of mucus in your nose (decongestant).  Avoiding the use of alcohol, medicines to help you relax, or certain pain medicines (narcotics).  Losing weight, if needed.  Changing your diet.  Not smoking.  Using a machine to open your airway while you sleep, such as: ? An oral appliance. This is a mouthpiece that shifts your lower jaw forward. ? A CPAP device. This device blows air through a mask when you breathe out (exhale). ? An EPAP device. This has valves that you put in each nostril. ? A BPAP device. This device blows air through a mask when you breathe in (inhale) and breathe out.  Having surgery if other treatments do not work. It is important to get treatment for sleep apnea. Without treatment, it can lead to:  High blood pressure.  Coronary artery  disease.  In men, not being able to have an erection (impotence).  Reduced thinking ability. Follow these instructions at home: Lifestyle  Make changes that your doctor recommends.  Eat a healthy diet.  Lose weight if needed.  Avoid alcohol, medicines to help you relax, and some pain medicines.  Do not use any products that contain nicotine or tobacco, such as cigarettes, e-cigarettes, and chewing tobacco. If you need help quitting, ask your doctor. General instructions  Take over-the-counter and prescription medicines only as told by your doctor.  If you were given a machine to use while you sleep, use it only as told by your doctor.  If you are having surgery, make sure to tell your doctor you have sleep apnea. You may need to bring your device with you.  Keep all follow-up visits as told by your doctor. This is important. Contact a doctor if:  The machine that you were given to use during sleep bothers you or does not seem to be working.  You do not get better.  You get worse. Get help right away if:  Your chest hurts.  You have trouble breathing in enough air.  You have an uncomfortable feeling in your back, arms, or stomach.  You have trouble talking.  One side of your body feels weak.  A part of your face is hanging down. These symptoms may be an emergency. Do not wait to see if the symptoms will go away. Get medical help right away. Call your local emergency services (911 in the [place] Do not drive yourself to the hospital. Summary  This condition affects breathing during sleep.  The most common cause is a collapsed or blocked airway.  The goal of treatment is to help you breathe normally while you sleep. This information is not intended to replace advice given to you by your health care provider. Make sure you discuss any questions you have with your health care provider. Document Revised: 10/01/2018 Document Reviewed: 08/10/2018 Elsevier Patient  Education  [place].

## 2020-01-09 NOTE — Progress Notes (Signed)
PATIENT: Michael [name] DOB: 07/08/1971  REASON FOR VISIT: follow up HISTORY FROM: patient  Chief Complaint  Patient presents with  . Follow-up    New RM. alone. States that sometimes the CPAP hurts his nose.      HISTORY OF PRESENT ILLNESS: Today 01/09/20 Michael [name] is a 49 y.o. male here today for follow up for severe OSA. After follow up with Dr Brett Fairy in 07/2019, sleep study was repeated and confirmed severe OSA. Bipap was recommended. He reports that he just recently started using BiPAP at home. He has used BiPAP for approximately 8 days since initiation in October.  He states he has had some difficulty getting used to the machine.  He currently uses nasal pillows and feels that he would do better with a full facemask.  He has motivated to continue working on compliance.  Compliance report dated 12/10/2019 through 01/08/2020 reveals that he used BiPAP 8 of the last 30 days for compliance of 27%.  He did not use BiPAP greater than 4 hours during the last 30 days.  Average usage was 1 hour and 34 minutes.  Residual AHI was 0.9 with IPAP of 20 cm of water and EPAP of 11 cm of water.  Pressure support of 4 cm of water.  There was no significant leak noted.  HISTORY: (copied from Dr Dohmeier's note on 08/18/2019)  HPI:  Michael [name] is a 49 y.o. male , and seen on 08-18-2019 upon a new  referral  from Dr. Luciana Axe for a sleep consultation,  As I had described previously Michael. [name] had been diagnosed with obstructive sleep apnea at the [facility] sleep center, He underwent a polysomnography on 17 February 2013 the results with an AHI of 32.7/h slight elevation and RDI of 34.3/h, there were more central apneas and obstructive apneas that this is a complex apnea situation.  He did not have PLM's, oxygen nadir was 85% EKG indicated tachycardia for much of the night.  He did not have prolonged hypoxemia.  He returned for a CPAP titration but could not tolerate the [facility] mask which is a  full facemask in large size.  He continues to wake up fatigued he has loud and continuous snoring, sometimes he will be choking.  He has often trouble falling asleep, has frequent respiratory visits sometimes morning headaches sometimes nightmares.  He wakes up with a dry mouth usually.  In addition he has been untreated now for several years but has developed more comorbidities these include an ejection fraction reduction to [id number] grade 1 diastolic dysfunction, hypertension, he has been using inhalers as needed for wheezing, he has been on 3 or more drugs to control hypertension and he also has a prescription for as needed hydrocodone. He has used this for migraine and he breaks the tablet in half he reports.  There has also been changes in his social history, he was laid off during the coronavirus pandemic and since May has been unemployed.  He spends more time in bed, he reports insomnia, tossing and turning and some nights he will not go to sleep before 3 AM. His son passed in 2018, killed in a MVA at age 13, he was not the driver. The driver was drunk.   He is interested in seeing if there are alternatives to the CPAP therapy, he has heard about the inspire procedure and device.  However the inspire device has been limited to patients with a BMI of 32 or less and  at this time Michael. courts BMI is 44.5.   I would like to add that his blood pressure has been 150s over 111 today in spite of being on a total of 5 blood pressure medications.  Michael. [name] was seen here on 05/06/2017 for a sleep consultation.  He had on April 5 been seen at primary care at [facility] and was diagnosed with elevated blood pressure, he has been on antihypertensive medication but needed refills at the time. He has a history of asthma, abnormal EKG, malignant hypertension and migraine headaches with aura as well as obstructive sleep apnea. He was diagnosed with OSA over 4 years ago but has not gotten a CPAP. He needed to be re-referred  for a new sleep study.  Michael. [name] is obese, reports lethargy, insomnia, shortness of breath, headaches, witnessed snoring and has frequent apneas, and is currently not gainfully employed, he presented here today with his mother [name] and is four-year-old nephew.  Sleep habits are as follows: The patient reports that he sleeps every night in bed but takes longer to go to sleep after he goes into the bedroom. He does not have a set bedtime, his bedtime may average 2 AM, always after midnight. His mother describes that he takes . Naps throughout the day and evening while he watches TV, and he watches TV in the bedroom as well. With the TV is switched off he seems to wake up. He seems to sleep in intervals of the maximal 2 hour duration, and well into the morning hours. For example he may sleep from 7 AM to 9 AM. He takes diuretics and has to go to the bathroom frequently. He also has nocturia before he takes his diuretic in the morning. He reports off and on waking up with headaches, headaches wake him up, too. He wakes always exhausted, unrefreshed. His TV is now turned off after late night news. ..  Brother has CPAP,   Social history: recently again unemployed, father of 34, children live with their mother- girlfriend snores. No tobacco use, no ETOH use, Caffeine use; Coffee 2 a day, iced tea, Soda 2 a day.     REVIEW OF SYSTEMS: Out of a complete 14 system review of symptoms, the patient complains only of the following symptoms, none and all other reviewed systems are negative.  Fatigue severity scale: 50  ALLERGIES: Allergies  Allergen Reactions  . Statins Other (See Comments)    unknown    HOME MEDICATIONS: Outpatient Medications Prior to Visit  Medication Sig Dispense Refill  . amLODipine (NORVASC) 10 MG tablet TAKE 1 TABLET BY MOUTH DAILY 90 tablet 0  . buPROPion (WELLBUTRIN XL) 300 MG 24 hr tablet     . carvedilol (COREG) 6.25 MG tablet Take 6.25 mg by mouth 2 (two) times  daily.  60 tablet 11  . chlorthalidone (HYGROTON) 25 MG tablet Take 25 mg by mouth daily.    Marland Kitchen lisinopril (PRINIVIL,ZESTRIL) 40 MG tablet Take 1 tablet (40 mg total) by mouth daily. (Patient taking differently: Take 50 mg by mouth daily. ) 90 tablet 0  . potassium chloride (K-DUR) 10 MEQ tablet TAKE 1 TABLET(10 MEQ) BY MOUTH DAILY (Patient taking differently: Take 10 mEq by mouth daily. TAKE 1 TABLET(10 MEQ) BY MOUTH DAILY) 30 tablet 0  . traZODone (DESYREL) 50 MG tablet Take 1 tablet (50 mg total) by mouth at bedtime. (Patient not taking: Reported on 01/09/2020) 30 tablet 1   No facility-administered medications prior to visit.  PAST MEDICAL HISTORY: Past Medical History:  Diagnosis Date  . Anxiety   . Depression    Questionable per records. Not on any medication.   Marland Kitchen History of kidney stones   . Hx of echocardiogram    a. Echo 12/13/12: Mild LVH, EF 99991111, grade 1 diastolic dysfunction, mild LAE, mild RVE, mild RAE  . Hypertension   . Insomnia   . Migraine   . Noncompliance   . OSA (obstructive sleep apnea)    a. Sleep Study 02/2013:  mod OSA, AHI 33 per hour, O2 sat nadir 85%    PAST SURGICAL HISTORY: Past Surgical History:  Procedure Laterality Date  . Admission  04/2012   Malignant HTN, HA.  CT head negative, cardiology consult.    . CYSTOSCOPY WITH RETROGRADE PYELOGRAM, URETEROSCOPY AND STENT PLACEMENT Left 01/24/2019   Procedure: CYSTOSCOPY WITH RETROGRADE PYELOGRAM, URETEROSCOPY AND STENT PLACEMENT;  Surgeon: Cleon Gustin, MD;  Location: [facility];  Service: Urology;  Laterality: Left;  . HOLMIUM LASER APPLICATION Left [id number]   Procedure: HOLMIUM LASER APPLICATION;  Surgeon: Cleon Gustin, MD;  Location: [facility];  Service: Urology;  Laterality: Left;  Marland Kitchen MANDIBLE SURGERY  1998   metal plate    FAMILY HISTORY: Family History  Problem Relation Age of Onset  . Cancer Father        prostate, colon- age was in his 47's    . Heart disease Father 30       MI   . Hypertension Father   . Diabetes Mother   . Hypertension Mother   . Hypertension Brother   . Cancer Maternal Grandfather   . Cancer Paternal Grandfather     SOCIAL HISTORY: Social History   Socioeconomic History  . Marital status: Divorced    Spouse name: Not on file  . Number of children: 4  . Years of education: Not on file  . Highest education level: Not on file  Occupational History  . Occupation: Unemployed//disabled  Tobacco Use  . Smoking status: Never Smoker  . Smokeless tobacco: Never Used  Substance and Sexual Activity  . Alcohol use: No    Alcohol/week: 0.0 standard drinks  . Drug use: No  . Sexual activity: Yes    Comment: per pt's blue health form - 1 sex partner in last 12 months  Other Topics Concern  . Not on file  Social History Narrative   Marital status: single; living with fiance      Children: 4 children, no grandchildren.      Employment:  Disability for learning disabilities since 2012.   Right-handed   Caffeine: occasional         Social Determinants of Health   Financial Resource Strain:   . Difficulty of Paying Living Expenses: Not on file  Food Insecurity:   . Worried About Charity fundraiser in the Last Year: Not on file  . Ran Out of Food in the Last Year: Not on file  Transportation Needs:   . Lack of Transportation (Medical): Not on file  . Lack of Transportation (Non-Medical): Not on file  Physical Activity:   . Days of Exercise per Week: Not on file  . Minutes of Exercise per Session: Not on file  Stress:   . Feeling of Stress : Not on file  Social Connections:   . Frequency of Communication with Friends and Family: Not on file  . Frequency of Social Gatherings with Friends and Family: Not on  file  . Attends Religious Services: Not on file  . Active Member of Clubs or Organizations: Not on file  . Attends Archivist Meetings: Not on file  . Marital Status: Not on file   Intimate Partner Violence:   . Fear of Current or Ex-Partner: Not on file  . Emotionally Abused: Not on file  . Physically Abused: Not on file  . Sexually Abused: Not on file      PHYSICAL EXAM  Vitals:   01/09/20 1335  BP: (!) 154/98  Pulse: 87  Temp: (!) 97 F (36.1 C)  Weight: (!) 302 lb 6.4 oz (137.2 kg)  Height: 5\' 9"  (1.753 m)   Body mass index is 44.66 kg/m.  Generalized: Well developed, in no acute distress  Cardiology: normal rate and rhythm, no murmur noted Respiratory: Clear to auscultation bilaterally Neurological examination  Mentation: Alert oriented to time, place, history taking. Follows all commands speech and language fluent Cranial nerve II-XII: Pupils were equal round reactive to light. Extraocular movements were full, visual field were full  Motor: The motor testing reveals 5 over 5 strength of all 4 extremities. Good symmetric motor tone is noted throughout.   Gait and station: Gait is normal.   DIAGNOSTIC DATA (LABS, IMAGING, TESTING) - I reviewed patient records, labs, notes, testing and imaging myself where available.  No flowsheet data found.   Lab Results  Component Value Date   WBC 5.5 05/26/2019   HGB 13.9 05/26/2019   HCT 42.9 05/26/2019   MCV 87.2 05/26/2019   PLT 227 05/26/2019      Component Value Date/Time   NA 138 05/26/2019 1713   K 3.9 05/26/2019 1713   CL 100 05/26/2019 1713   CO2 25 05/26/2019 1713   GLUCOSE 99 05/26/2019 1713   BUN 11 05/26/2019 1713   CREATININE 0.95 05/26/2019 1713   CREATININE 1.07 06/03/2016 0928   CALCIUM 9.2 05/26/2019 1713   PROT 8.3 (H) 04/03/2017 0844   ALBUMIN 3.9 04/03/2017 0844   AST 19 04/03/2017 0844   ALT 17 04/03/2017 0844   ALKPHOS 64 04/03/2017 0844   BILITOT 0.5 04/03/2017 0844   GFRNONAA >60 05/26/2019 1713   GFRNONAA >89 08/27/2015 1430   GFRAA >60 05/26/2019 1713   GFRAA >89 08/27/2015 1430   Lab Results  Component Value Date   CHOL 134 06/13/2015   HDL 42 06/13/2015    LDLCALC 84 06/13/2015   TRIG 42 06/13/2015   CHOLHDL 3.2 06/13/2015   Lab Results  Component Value Date   HGBA1C 5.6 06/13/2015   No results found for: [id number] Lab Results  Component Value Date   TSH 1.010 05/08/2017       ASSESSMENT AND PLAN 49 y.o. year old male  has a past medical history of Anxiety, Depression, History of kidney stones, echocardiogram, Hypertension, Insomnia, Migraine, Noncompliance, and OSA (obstructive sleep apnea). here with     ICD-10-CM   1. OSA treated with BiPAP  G47.33 For home use only DME Bipap    Brigg admits that he has had difficulty with compliance.  He has recently started using his BiPAP machine but feels that the nasal pillow is uncomfortable.  We will place orders today for a mask refitting.  We have discussed risk of untreated sleep apnea.  I have encouraged him to use BiPAP nightly and for greater than 4 hours each night.  We have also reviewed his blood pressures in the office.  I have advised that he follow-up closely  with primary care for management.  He will follow-up with me in 8 weeks, sooner if needed.  He verbalizes understanding and agreement with this plan.   Orders Placed This Encounter  Procedures  . For home use only DME Bipap    Mask refitting    Order Specific Question:   Length of Need    Answer:   Lifetime    Order Specific Question:   Inspiratory pressure    Answer:   OTHER SEE COMMENTS    Order Specific Question:   Expiratory pressure    Answer:   OTHER SEE COMMENTS     No orders of the defined types were placed in this encounter.     I spent 15 minutes with the patient. 50% of this time was spent counseling and educating patient on plan of care and medications.    Debbora Presto, FNP-C 01/09/2020, 2:27 PM [facility] [address], [place], [place] [address] [phone]

## 2020-01-19 ENCOUNTER — Other Ambulatory Visit: Payer: Self-pay

## 2020-01-19 ENCOUNTER — Emergency Department (HOSPITAL_COMMUNITY)
Admission: EM | Admit: 2020-01-19 | Discharge: 2020-01-19 | Disposition: A | Discharge status: Home / Self Care | Payer: Medicare HMO | Attending: Emergency Medicine | Admitting: Emergency Medicine

## 2020-01-19 ENCOUNTER — Encounter (HOSPITAL_COMMUNITY): Payer: Self-pay | Admitting: Emergency Medicine

## 2020-01-19 DIAGNOSIS — I5032 Chronic diastolic (congestive) heart failure: Secondary | ICD-10-CM | POA: Diagnosis not present

## 2020-01-19 DIAGNOSIS — Z79899 Other long term (current) drug therapy: Secondary | ICD-10-CM | POA: Insufficient documentation

## 2020-01-19 DIAGNOSIS — I110 Hypertensive heart disease with heart failure: Secondary | ICD-10-CM | POA: Diagnosis not present

## 2020-01-19 DIAGNOSIS — Z202 Contact with and (suspected) exposure to infections with a predominantly sexual mode of transmission: Secondary | ICD-10-CM | POA: Insufficient documentation

## 2020-01-19 DIAGNOSIS — Z6841 Body Mass Index (BMI) 40.0 and over, adult: Secondary | ICD-10-CM | POA: Insufficient documentation

## 2020-01-19 DIAGNOSIS — R300 Dysuria: Secondary | ICD-10-CM | POA: Diagnosis not present

## 2020-01-19 LAB — URINALYSIS, ROUTINE W REFLEX MICROSCOPIC
Bilirubin Urine: NEGATIVE
Glucose, UA: NEGATIVE mg/dL
Hgb urine dipstick: NEGATIVE
Ketones, ur: NEGATIVE mg/dL
Leukocytes,Ua: NEGATIVE
Nitrite: NEGATIVE
Protein, ur: NEGATIVE mg/dL
Specific Gravity, Urine: 1.018 (ref 1.005–1.030)
pH: 7 (ref 5.0–8.0)

## 2020-01-19 LAB — RAPID HIV SCREEN (HIV 1/2 AB+AG)
HIV 1/2 Antibodies: NONREACTIVE
HIV-1 P24 Antigen - HIV24: NONREACTIVE

## 2020-01-19 MED ORDER — AZITHROMYCIN 1 G PO PACK
1.0000 g | PACK | Freq: Once | ORAL | Status: AC
  Administered 2020-01-19: 13:00:00 1 g via ORAL
  Filled 2020-01-19: qty 1

## 2020-01-19 MED ORDER — CEFTRIAXONE SODIUM 500 MG IJ SOLR
500.0000 mg | Freq: Once | INTRAMUSCULAR | Status: AC
  Administered 2020-01-19: 500 mg via INTRAMUSCULAR
  Filled 2020-01-19: qty 500

## 2020-01-19 MED ORDER — LIDOCAINE HCL (PF) 1 % IJ SOLN
INTRAMUSCULAR | Status: AC
  Administered 2020-01-19: 5 mL
  Filled 2020-01-19: qty 5

## 2020-01-19 NOTE — Discharge Instructions (Signed)
You have been treated presumptively today for gonorrhea and chlamydia.   You have been tested today for gonorrhea and chlamydia as well as HIV and syphilis. These results will be available in approximately 3 days. You may check your MyChart account for results. Please inform all sexual partners of positive results and that they should be tested and treated as well.  Please wait 2 weeks and be sure that you and your partners are symptom free before returning to sexual activity. Please use protection with every sexual encounter.  Follow Up: Please followup with your primary doctor in 3 days for discussion of your diagnoses and further evaluation after today's visit; if you do not have a primary care doctor use the resource guide provided to find one; Please return to the ER for worsening symptoms, high fevers or persistent vomiting.

## 2020-01-19 NOTE — ED Triage Notes (Signed)
Pt reports possible STD exposure. Endorses some burning when voiding. Denies any discharge.

## 2020-01-19 NOTE — ED Provider Notes (Signed)
[facility] EMERGENCY DEPARTMENT Provider Note   CSN: [id number] Arrival date & time: 01/19/20  [id number]     History Chief Complaint  Patient presents with  . SEXUALLY TRANSMITTED DISEASE    Michael [name] is a 49 y.o. male with PMH of HTN, anxiety, and kidney stones presents to the ED with a 5-day history of burning with urination.  He reports that his girlfriend also has been experiencing symptoms of abnormal vaginal discharge and was recently evaluated and treated for STI.  Patient reports that he was evaluated on 12/29/2019, but with no antibiotics administered as she was asymptomatic at the time.  He is unsure of the results of his GC testing at that time.  Patient states that since then he has continued to engage with his girlfriend, but suspect that perhaps she has not been painful.  He denies any significant pain, fevers or chills, testicular discomfort, penile discharge, or other symptoms.  He would also like to be tested for HIV and syphilis.  HPI     Past Medical History:  Diagnosis Date  . Anxiety   . Depression    Questionable per records. Not on any medication.   Marland Kitchen History of kidney stones   . Hx of echocardiogram    a. Echo 12/13/12: Mild LVH, EF 99991111, grade 1 diastolic dysfunction, mild LAE, mild RVE, mild RAE  . Hypertension   . Insomnia   . Migraine   . Noncompliance   . OSA (obstructive sleep apnea)    a. Sleep Study 02/2013:  mod OSA, AHI 33 per hour, O2 sat nadir 85%    Patient Active Problem List   Diagnosis Date Noted  . Morbid obesity with body mass index (BMI) of 40.0 to 44.9 in adult [facility]) 09/21/2019  . CHF (congestive heart failure), NYHA class I, acute, diastolic ([place]) A999333  . Central sleep apnea comorbid with prescribed opioid use 09/21/2019  . History of uncontrolled hypertension 09/21/2019  . Complex sleep apnea syndrome 08/18/2019  . Claustrophobia 08/18/2019  . Anxiety associated with depression 08/18/2019  . Hypokalemia  04/04/2017  . Morbid obesity ([place]) 04/04/2017  . Chest wall pain 04/04/2017  . Asthma 02/13/2016  . Non compliance w medication regimen 06/13/2015  . OSA (obstructive sleep apnea) 04/06/2013  . Abnormal EKG 11/08/2012  . Essential hypertension   . HTN (hypertension), malignant 04/29/2012  . Migraine headache with aura 04/29/2012    Past Surgical History:  Procedure Laterality Date  . Admission  04/2012   Malignant HTN, HA.  CT head negative, cardiology consult.    . CYSTOSCOPY WITH RETROGRADE PYELOGRAM, URETEROSCOPY AND STENT PLACEMENT Left 01/24/2019   Procedure: CYSTOSCOPY WITH RETROGRADE PYELOGRAM, URETEROSCOPY AND STENT PLACEMENT;  Surgeon: Cleon Gustin, MD;  Location: [facility];  Service: Urology;  Laterality: Left;  . HOLMIUM LASER APPLICATION Left [id number]   Procedure: HOLMIUM LASER APPLICATION;  Surgeon: Cleon Gustin, MD;  Location: [facility];  Service: Urology;  Laterality: Left;  Marland Kitchen MANDIBLE SURGERY  1998   metal plate       Family History  Problem Relation Age of Onset  . Cancer Father        prostate, colon- age was in his 70's  . Heart disease Father 50       MI   . Hypertension Father   . Diabetes Mother   . Hypertension Mother   . Hypertension Brother   . Cancer Maternal Grandfather   . Cancer Paternal Grandfather  Social History   Tobacco Use  . Smoking status: Never Smoker  . Smokeless tobacco: Never Used  Substance Use Topics  . Alcohol use: No    Alcohol/week: 0.0 standard drinks  . Drug use: No    Home Medications Prior to Admission medications   Medication Sig Start Date End Date Taking? Authorizing Provider  amLODipine (NORVASC) 10 MG tablet TAKE 1 TABLET BY MOUTH DAILY 04/03/17   Tenna Delaine D, PA-C  buPROPion (WELLBUTRIN XL) 300 MG 24 hr tablet  06/16/19   [provider]  carvedilol (COREG) 6.25 MG tablet Take 6.25 mg by mouth 2 (two) times daily.  05/11/17   Sueanne Margarita,  MD  chlorthalidone (HYGROTON) 25 MG tablet Take 25 mg by mouth daily. 12/06/19   [provider]  lisinopril (PRINIVIL,ZESTRIL) 40 MG tablet Take 1 tablet (40 mg total) by mouth daily. Patient taking differently: Take 50 mg by mouth daily.  04/03/17   Tenna Delaine D, PA-C  potassium chloride (K-DUR) 10 MEQ tablet TAKE 1 TABLET(10 MEQ) BY MOUTH DAILY Patient taking differently: Take 10 mEq by mouth daily. TAKE 1 TABLET(10 MEQ) BY MOUTH DAILY 12/23/16   [name], MD  traZODone (DESYREL) 50 MG tablet Take 1 tablet (50 mg total) by mouth at bedtime. Patient not taking: Reported on 01/09/2020 08/18/19   Dohmeier, Asencion Partridge, MD    Allergies    Statins  Review of Systems   Review of Systems  Constitutional: Negative for chills and fever.  Genitourinary: Positive for dysuria. Negative for difficulty urinating, discharge, flank pain, genital sores and penile pain.  Musculoskeletal: Negative for back pain.    Physical Exam Updated Vital Signs BP (!) 148/94   Pulse 80   Temp 98.1 F (36.7 C) (Oral)   Resp 16   SpO2 99%   Physical Exam Vitals and nursing note reviewed. Exam conducted with a chaperone present.  Constitutional:      Appearance: Normal appearance. He is not ill-appearing.  HENT:     Head: Normocephalic and atraumatic.  Eyes:     General: No scleral icterus.    Conjunctiva/sclera: Conjunctivae normal.  Cardiovascular:     Rate and Rhythm: Normal rate and regular rhythm.     Pulses: Normal pulses.     Heart sounds: Normal heart sounds.  Pulmonary:     Effort: Pulmonary effort is normal. No respiratory distress.     Breath sounds: Normal breath sounds.  Abdominal:     General: Abdomen is flat. There is no distension.     Palpations: Abdomen is soft.     Tenderness: There is no abdominal tenderness. There is no guarding.  Genitourinary:    Comments: No testicle swelling or TTP.  No evident penile discharge.  No masses appreciated.  No rashes.  Skin:     General: Skin is dry.     Capillary Refill: Capillary refill takes less than 2 seconds.  Neurological:     Mental Status: He is alert and oriented to person, place, and time.     GCS: GCS eye subscore is 4. GCS verbal subscore is 5. GCS motor subscore is 6.  Psychiatric:        Mood and Affect: Mood normal.        Behavior: Behavior normal.        Thought Content: Thought content normal.     ED Results / Procedures / Treatments   Labs (all labs ordered are listed, but only abnormal results are displayed) Labs  Reviewed  URINALYSIS, ROUTINE W REFLEX MICROSCOPIC  RAPID HIV SCREEN (HIV 1/2 AB+AG)  RPR  GC/CHLAMYDIA PROBE AMP ([place]) NOT AT [facility]    EKG None  Radiology No results found.  Procedures Procedures (including critical care time)  Medications Ordered in ED Medications  azithromycin (ZITHROMAX) powder 1 g (has no administration in time range)  cefTRIAXone (ROCEPHIN) injection 500 mg (has no administration in time range)    ED Course  I have reviewed the triage vital signs and the nursing notes.  Pertinent labs & imaging results that were available during my care of the patient were reviewed by me and considered in my medical decision making (see chart for details).    MDM Rules/Calculators/A&P                      Patient is afebrile without abdominal tenderness, abdominal pain or painful bowel movements to indicate prostatitis.  No tenderness to palpation of the testes or epididymis to suggest orchitis or epididymitis.  STD cultures obtained including HIV, syphilis, gonorrhea and chlamydia. Patient to be discharged with instructions to follow up with PCP. Discussed importance of using protection when sexually active. Pt understands that they have GC/Chlamydia cultures pending and that they will need to inform all sexual partners if results return positive. Patient has been treated prophylactically with azithromycin and Rocephin.   Return precautions  discussed.  All of the evaluation and work-up results were discussed with the patient and any family at bedside. They were provided opportunity to ask any additional questions and have none at this time. They have expressed understanding of verbal discharge instructions as well as return precautions and are agreeable to the plan.    Final Clinical Impression(s) / ED Diagnoses Final diagnoses:  Possible exposure to STD    Rx / DC Orders ED Discharge Orders    None       Corena Herter, PA-C 01/19/20 1223    Charlesetta Shanks, MD 01/20/20 [phone]

## 2020-01-20 LAB — RPR: RPR Ser Ql: NONREACTIVE

## 2020-02-26 ENCOUNTER — Encounter (HOSPITAL_COMMUNITY): Payer: Self-pay

## 2020-02-26 ENCOUNTER — Ambulatory Visit (HOSPITAL_COMMUNITY)
Admission: EM | Admit: 2020-02-26 | Discharge: 2020-02-26 | Disposition: A | Discharge status: Home / Self Care | Payer: Medicare Other | Attending: Urgent Care | Admitting: Urgent Care

## 2020-02-26 ENCOUNTER — Other Ambulatory Visit: Payer: Self-pay

## 2020-02-26 DIAGNOSIS — M79652 Pain in left thigh: Secondary | ICD-10-CM

## 2020-02-26 DIAGNOSIS — T148XXA Other injury of unspecified body region, initial encounter: Secondary | ICD-10-CM

## 2020-02-26 DIAGNOSIS — S8012XA Contusion of left lower leg, initial encounter: Secondary | ICD-10-CM

## 2020-02-26 DIAGNOSIS — S79922A Unspecified injury of left thigh, initial encounter: Secondary | ICD-10-CM

## 2020-02-26 MED ORDER — TRAMADOL HCL 50 MG PO TABS: 50.0000 mg | ORAL_TABLET | Freq: Four times a day (QID) | ORAL | 0 refills | Status: DC | PRN

## 2020-02-26 NOTE — Discharge Instructions (Addendum)
Apply ice pack for 20 minutes every 2 hours.  Please schedule Tylenol at 500mg -650mg  once every 6 hours as needed for aches and pains associated with your hematoma, contusion.  If you still have pain despite taking Tylenol regularly, this is breakthrough pain.  You can use tramadol once every 6 hours for this.  Once your pain is better controlled, switch back to just Tylenol.

## 2020-02-26 NOTE — ED Triage Notes (Signed)
Pt state he has left side pain . This started today. Pt states he fell down the steps on his left side.  Pt states his hip is swollen. Pt will need a work note.

## 2020-02-26 NOTE — ED Provider Notes (Signed)
[place]   MRN: [id number] DOB: 07-May-1971  Subjective:   Michael [name] is a 49 y.o. male presenting for suffering an accidental fall on steps. He was holding a handrail but slipped, managed to hold on still but fell on his left side and has had a large knot on that side with moderate to severe pain.  Has not been helped by Tylenol.  Patient has been advised not to take NSAIDs by his PCP.  Also has a small or not on the lateral side of his lower left leg.  Patient states he is able to bear weight on his leg but causes pain over the swelling on his left thigh.  He is requesting a note for work so that he can rest his leg.  No current facility-administered medications for this encounter.  Current Outpatient Medications:  .  amLODipine (NORVASC) 10 MG tablet, TAKE 1 TABLET BY MOUTH DAILY, Disp: 90 tablet, Rfl: 0 .  buPROPion (WELLBUTRIN XL) 300 MG 24 hr tablet, , Disp: , Rfl:  .  carvedilol (COREG) 6.25 MG tablet, Take 6.25 mg by mouth 2 (two) times daily. , Disp: 60 tablet, Rfl: 11 .  chlorthalidone (HYGROTON) 25 MG tablet, Take 25 mg by mouth daily., Disp: , Rfl:  .  lisinopril (PRINIVIL,ZESTRIL) 40 MG tablet, Take 1 tablet (40 mg total) by mouth daily. (Patient taking differently: Take 50 mg by mouth daily. ), Disp: 90 tablet, Rfl: 0 .  potassium chloride (K-DUR) 10 MEQ tablet, TAKE 1 TABLET(10 MEQ) BY MOUTH DAILY (Patient taking differently: Take 10 mEq by mouth daily. TAKE 1 TABLET(10 MEQ) BY MOUTH DAILY), Disp: 30 tablet, Rfl: 0 .  traZODone (DESYREL) 50 MG tablet, Take 1 tablet (50 mg total) by mouth at bedtime. (Patient not taking: Reported on 01/09/2020), Disp: 30 tablet, Rfl: 1   Allergies  Allergen Reactions  . Statins Other (See Comments)    unknown    Past Medical History:  Diagnosis Date  . Anxiety   . Depression    Questionable per records. Not on any medication.   Marland Kitchen History of kidney stones   . Hx of echocardiogram    a. Echo 12/13/12: Mild LVH, EF 50-55%,  grade 1 diastolic dysfunction, mild LAE, mild RVE, mild RAE  . Hypertension   . Insomnia   . Migraine   . Noncompliance   . OSA (obstructive sleep apnea)    a. Sleep Study 02/2013:  mod OSA, AHI 33 per hour, O2 sat nadir 85%     Past Surgical History:  Procedure Laterality Date  . Admission  04/2012   Malignant HTN, HA.  CT head negative, cardiology consult.    . CYSTOSCOPY WITH RETROGRADE PYELOGRAM, URETEROSCOPY AND STENT PLACEMENT Left 01/24/2019   Procedure: CYSTOSCOPY WITH RETROGRADE PYELOGRAM, URETEROSCOPY AND STENT PLACEMENT;  Surgeon: Cleon Gustin, MD;  Location: [facility];  Service: Urology;  Laterality: Left;  . HOLMIUM LASER APPLICATION Left [id number]   Procedure: HOLMIUM LASER APPLICATION;  Surgeon: Cleon Gustin, MD;  Location: [facility];  Service: Urology;  Laterality: Left;  Marland Kitchen MANDIBLE SURGERY  1998   metal plate    Family History  Problem Relation Age of Onset  . Cancer Father        prostate, colon- age was in his 79's  . Heart disease Father 62       MI   . Hypertension Father   . Diabetes Mother   . Hypertension Mother   . Hypertension  Brother   . Cancer Maternal Grandfather   . Cancer Paternal Grandfather     Social History   Tobacco Use  . Smoking status: Never Smoker  . Smokeless tobacco: Never Used  Substance Use Topics  . Alcohol use: No    Alcohol/week: 0.0 standard drinks  . Drug use: No    ROS   Objective:   Vitals: BP (!) 168/135 (BP Location: Right Arm)   Pulse 95   Temp 98.3 F (36.8 C) (Oral)   Resp 18   Wt 290 lb (131.5 kg)   SpO2 100%   BMI 42.83 kg/m   Physical Exam Constitutional:      General: He is not in acute distress.    Appearance: Normal appearance. He is well-developed. He is obese. He is not ill-appearing, toxic-appearing or diaphoretic.  HENT:     Head: Normocephalic and atraumatic.     Right Ear: External ear normal.     Left Ear: External ear normal.      Nose: Nose normal.     Mouth/Throat:     Pharynx: Oropharynx is clear.  Eyes:     General: No scleral icterus.       Right eye: No discharge.        Left eye: No discharge.     Extraocular Movements: Extraocular movements intact.     Pupils: Pupils are equal, round, and reactive to light.  Cardiovascular:     Rate and Rhythm: Normal rate.  Pulmonary:     Effort: Pulmonary effort is normal.  Musculoskeletal:     Cervical back: Normal range of motion.     Left hip: No deformity, lacerations, tenderness, bony tenderness or crepitus. Normal range of motion. Decreased strength (Secondary to movement pain from his left thigh).       Legs:  Skin:    General: Skin is warm and dry.  Neurological:     Mental Status: He is alert and oriented to person, place, and time.  Psychiatric:        Mood and Affect: Mood normal.        Behavior: Behavior normal.        Thought Content: Thought content normal.        Judgment: Judgment normal.      Assessment and Plan :   1. Hematoma and contusion   2. Injury of left thigh, initial encounter   3. Pain of left thigh   4. Contusion of left lower extremity, initial encounter     Counseled patient on management of hematoma and contusion.  For now we will defer imaging given that he is able to bear weight and walk albeit with a slight limp of his left leg.  Recommend scheduling Tylenol, resting, icing, tramadol for severe pain. Counseled patient on potential for adverse effects with medications prescribed/recommended today, ER and return-to-clinic precautions discussed, patient verbalized understanding.    Jaynee Eagles, [place] 02/26/20 1711

## 2020-03-04 ENCOUNTER — Encounter (HOSPITAL_COMMUNITY): Payer: Self-pay

## 2020-03-04 ENCOUNTER — Ambulatory Visit (INDEPENDENT_AMBULATORY_CARE_PROVIDER_SITE_OTHER): Payer: Medicare Other

## 2020-03-04 ENCOUNTER — Ambulatory Visit (HOSPITAL_COMMUNITY): Payer: Medicare Other

## 2020-03-04 ENCOUNTER — Other Ambulatory Visit: Payer: Self-pay | Admitting: Family Medicine

## 2020-03-04 ENCOUNTER — Other Ambulatory Visit: Payer: Self-pay

## 2020-03-04 ENCOUNTER — Ambulatory Visit (HOSPITAL_COMMUNITY)
Admission: EM | Admit: 2020-03-04 | Discharge: 2020-03-04 | Disposition: A | Discharge status: Home / Self Care | Payer: Medicare Other | Attending: Family Medicine | Admitting: Family Medicine

## 2020-03-04 DIAGNOSIS — M25559 Pain in unspecified hip: Secondary | ICD-10-CM | POA: Diagnosis not present

## 2020-03-04 DIAGNOSIS — S79912A Unspecified injury of left hip, initial encounter: Secondary | ICD-10-CM | POA: Diagnosis not present

## 2020-03-04 DIAGNOSIS — S79919A Unspecified injury of unspecified hip, initial encounter: Secondary | ICD-10-CM | POA: Diagnosis not present

## 2020-03-04 DIAGNOSIS — I10 Essential (primary) hypertension: Secondary | ICD-10-CM | POA: Diagnosis not present

## 2020-03-04 DIAGNOSIS — S7012XA Contusion of left thigh, initial encounter: Secondary | ICD-10-CM | POA: Diagnosis not present

## 2020-03-04 DIAGNOSIS — W108XXS Fall (on) (from) other stairs and steps, sequela: Secondary | ICD-10-CM

## 2020-03-04 IMAGING — DX DG FEMUR 2+V*L*
4 series · 4 of 4 positions shown · non-contrast
Comparison: None.

CLINICAL DATA: 48-year-old who fell 1 week ago and has persistent
pain and bruising involving the LEFT thigh. Initial imaging
encounter.

EXAM:
LEFT FEMUR 2 VIEWS

[femur ap (1 of 2)]
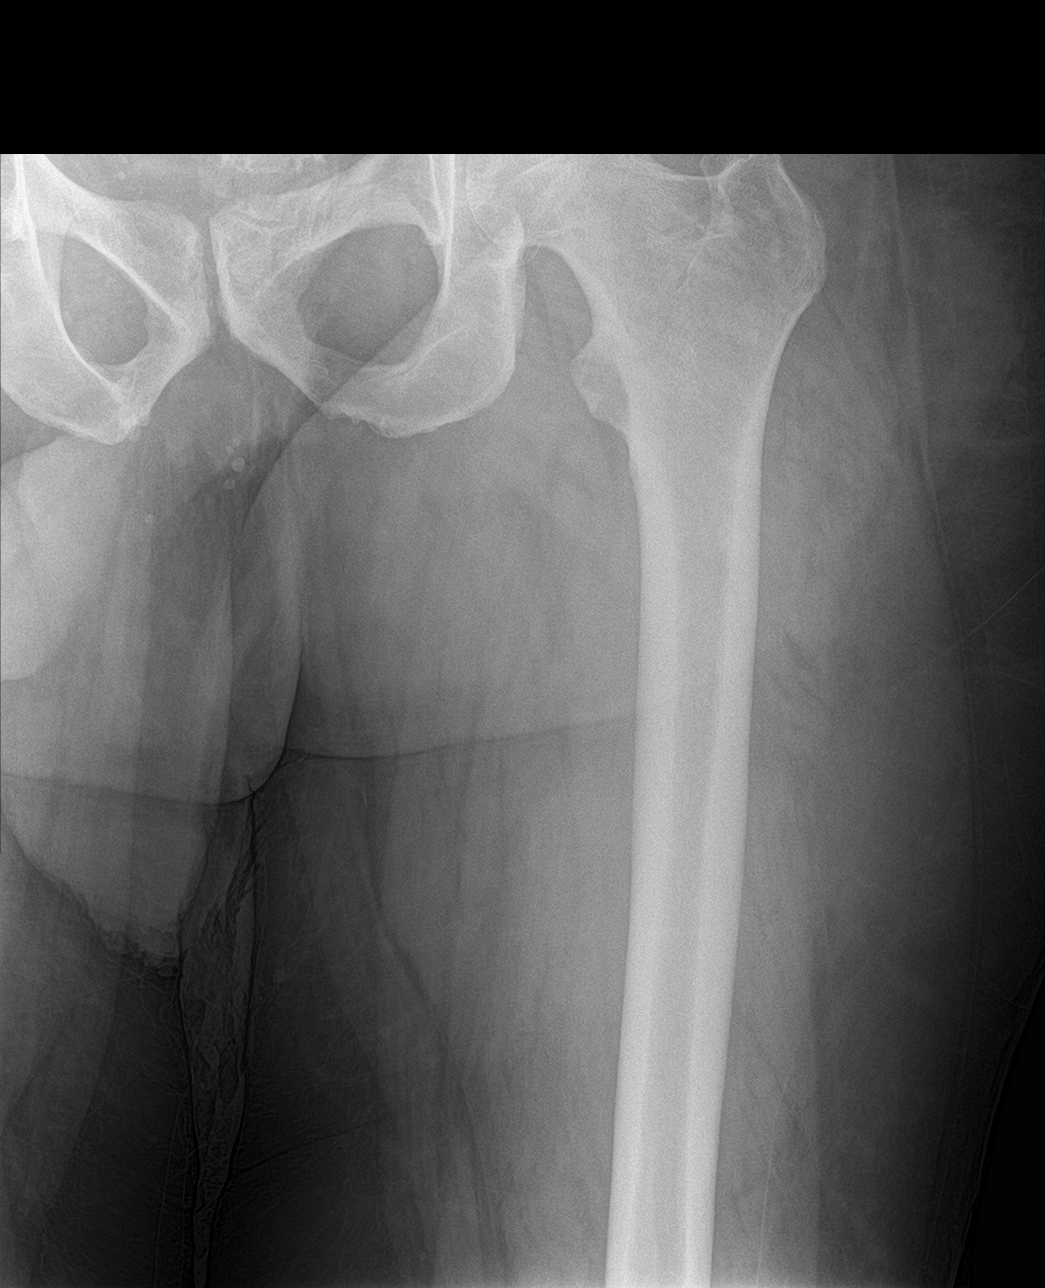

[femur ap (2 of 2)]
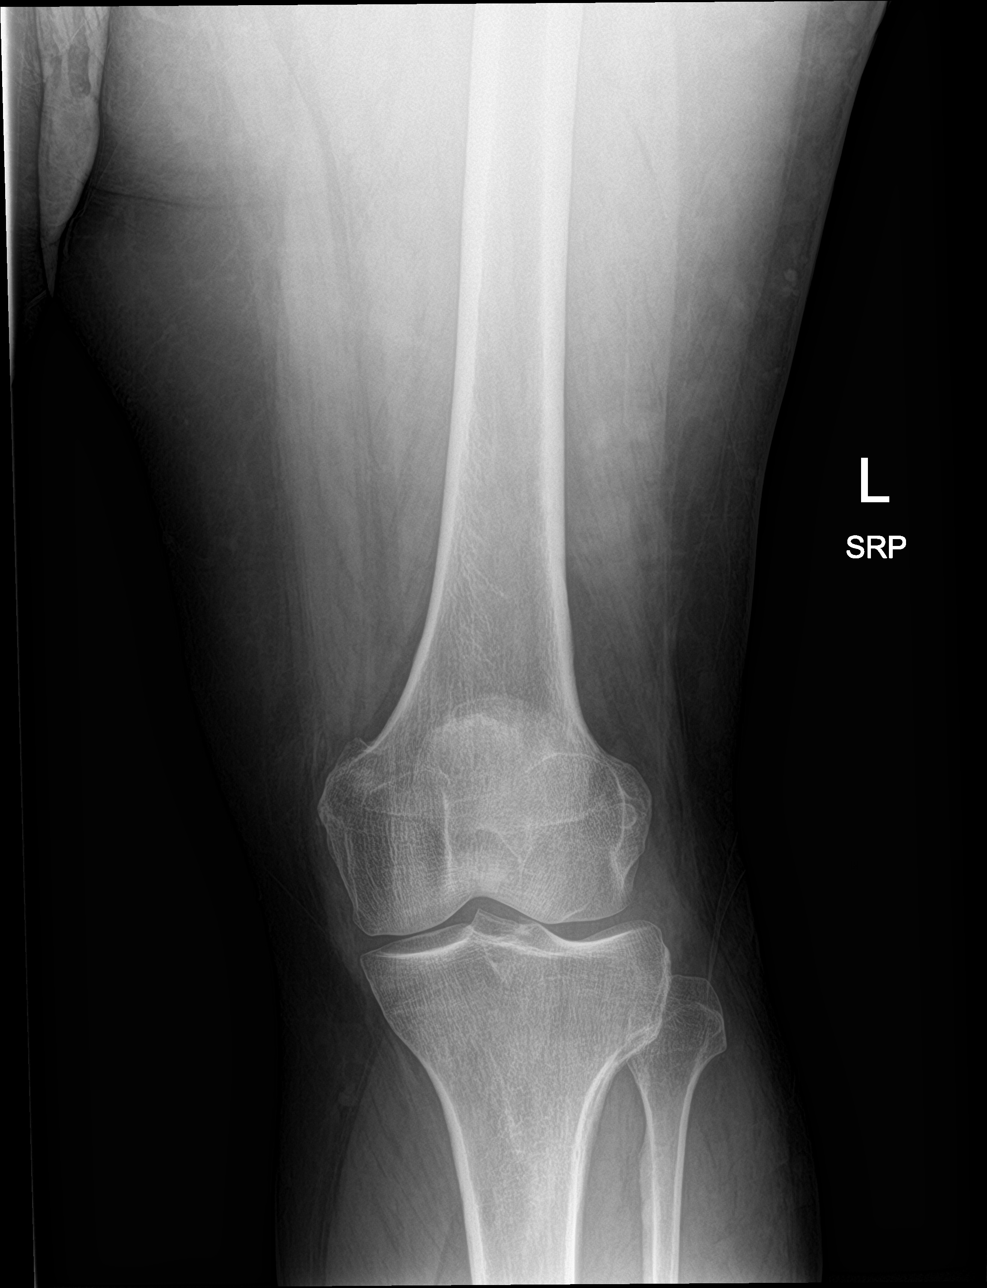

[femur lat (1 of 2)]
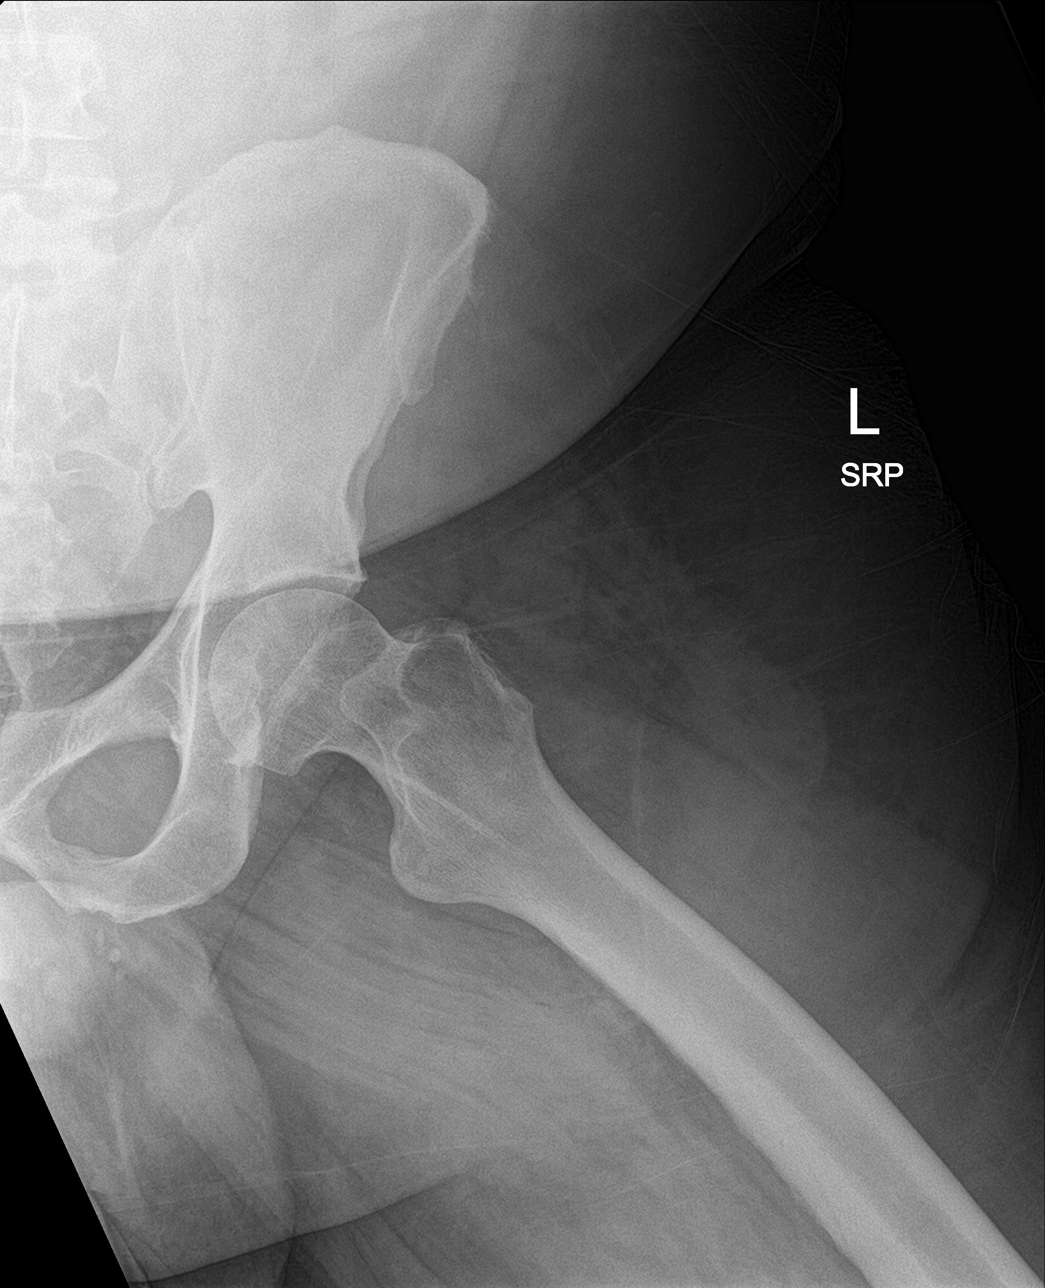

[femur lat (2 of 2)]
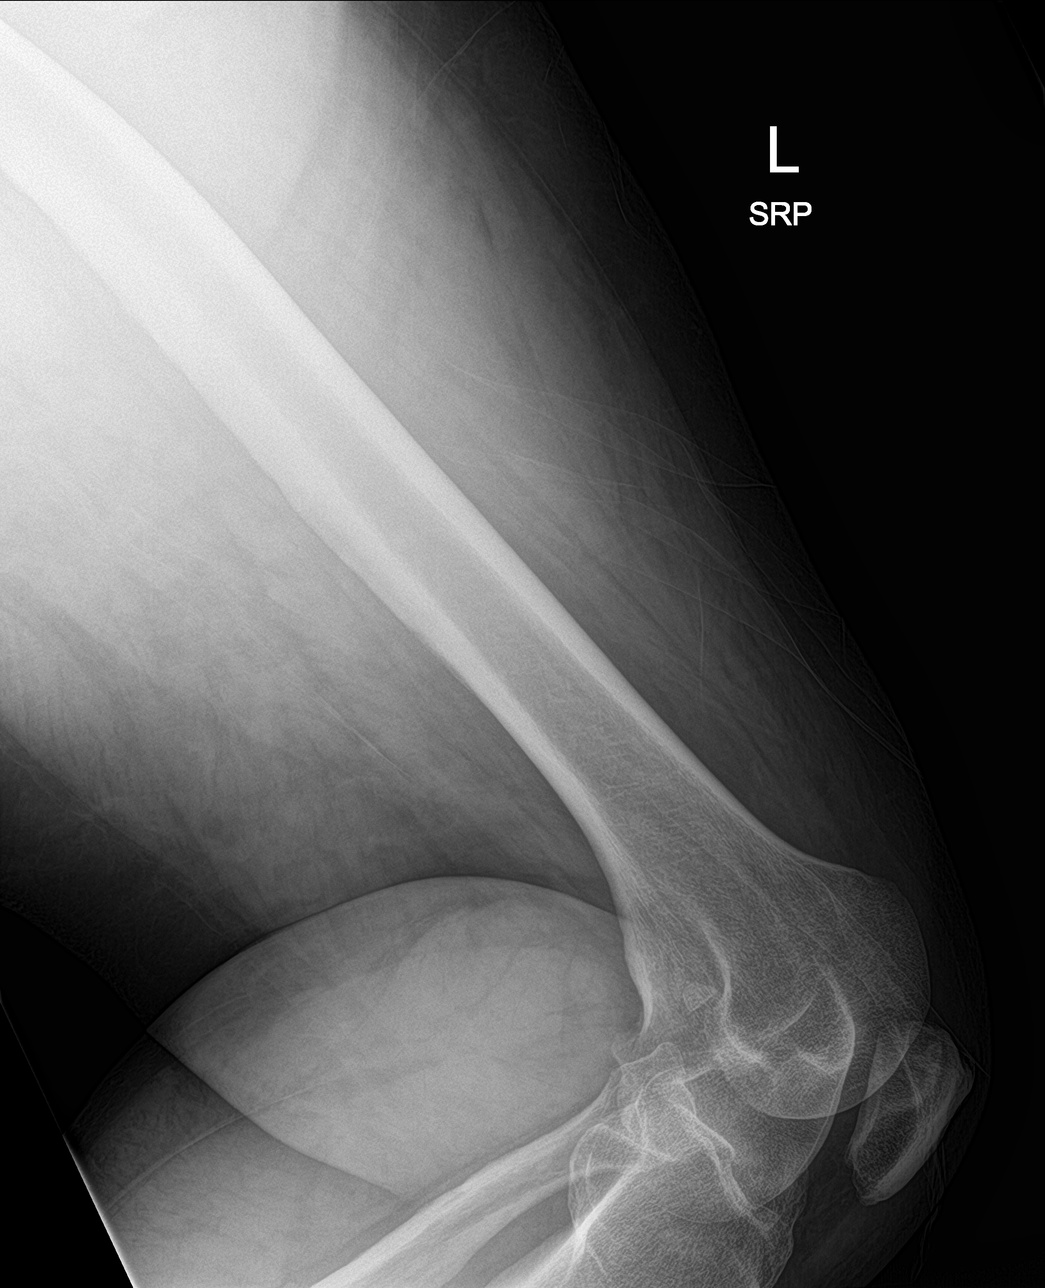

[4 of 4 positions shown; findings below may reference images not displayed]

FINDINGS: No evidence of acute or subacute fracture involving the femur. Well
preserved bone mineral density. No intrinsic osseous abnormality.
Visualized hip joint and knee joint anatomically aligned with
well-preserved joint spaces.
IMPRESSION: Normal examination.

## 2020-03-04 MED ORDER — CHLORTHALIDONE 25 MG PO TABS: 25.0000 mg | ORAL_TABLET | Freq: Every day | ORAL | 0 refills | Status: DC

## 2020-03-04 MED ORDER — KETOROLAC TROMETHAMINE 60 MG/2ML IM SOLN
INTRAMUSCULAR | Status: AC
  Filled 2020-03-04: qty 2

## 2020-03-04 MED ORDER — TIZANIDINE HCL 4 MG PO TABS: 4.0000 mg | ORAL_TABLET | Freq: Every evening | ORAL | 0 refills | Status: DC | PRN

## 2020-03-04 MED ORDER — LISINOPRIL 40 MG PO TABS: 40.0000 mg | ORAL_TABLET | Freq: Every day | ORAL | 0 refills | Status: DC

## 2020-03-04 MED ORDER — METHYLPREDNISOLONE SODIUM SUCC 125 MG IJ SOLR
125.0000 mg | Freq: Once | INTRAMUSCULAR | Status: AC
  Administered 2020-03-04: 125 mg via INTRAMUSCULAR

## 2020-03-04 MED ORDER — AMLODIPINE BESYLATE 10 MG PO TABS: 10.0000 mg | ORAL_TABLET | Freq: Every day | ORAL | 0 refills | Status: DC

## 2020-03-04 MED ORDER — METHYLPREDNISOLONE SODIUM SUCC 125 MG IJ SOLR
INTRAMUSCULAR | Status: AC
Start: 2020-03-04 — End: ?
  Filled 2020-03-04: qty 2

## 2020-03-04 MED ORDER — PREDNISONE 20 MG PO TABS: ORAL_TABLET | ORAL | 0 refills | Status: DC

## 2020-03-04 MED ORDER — KETOROLAC TROMETHAMINE 60 MG/2ML IM SOLN
60.0000 mg | Freq: Once | INTRAMUSCULAR | Status: AC
  Administered 2020-03-04: 60 mg via INTRAMUSCULAR

## 2020-03-04 MED ORDER — CARVEDILOL 6.25 MG PO TABS: 6.2500 mg | ORAL_TABLET | Freq: Two times a day (BID) | ORAL | 0 refills | Status: DC

## 2020-03-04 NOTE — ED Triage Notes (Signed)
Pt has return because his left leg is still having some pain from the fall on 02/26/20. Pt states that the swelling in his left leg has went down some but still having pain.

## 2020-03-04 NOTE — Telephone Encounter (Signed)
Unable to allow 90 day refill. Pt seen at urgent and needs to to follow-up with PCP. Refill only for 30 days as written

## 2020-03-04 NOTE — ED Provider Notes (Signed)
[facility]    CSN: [id number] Arrival date & time: 03/04/20  1228      History   Chief Complaint Chief Complaint  Patient presents with  . Leg Injury    HPI Michael [name] is a 49 y.o. male.   HPI  Patient presents today for evaluation of left leg pain.  Patient sustained a fall on 02/26/20 and treated conservatively with rest icing and prescribed a short course of tramadol for pain.  He presents today as he continues to have pain in the upper left thigh from fall.  Was prescribed tramadol along with Tylenol and reports no relief with either medication.  He did apply ice to injury however felt this only made the pain worse and cause worsening of the bruising.  He is unable to apply weight or pressure at all to his left hip which is causing him inability to achieve sleep.  Is okay with standing and no significant pain with walking.  Of note patient's blood pressure is excessively elevated today at 173/116. Has a history of mild LVH, OSA, hypertension.  He is followed by neurology and has at some point been followed by cardiology although is not distantly seen in cardiology.  He has a history of CHF.  He is currently without a primary care provider as he was dismissed due to missing appointments.  He is without blood pressure medication. He last had medication about 1 month ago.  Past Medical History:  Diagnosis Date  . Anxiety   . Depression    Questionable per records. Not on any medication.   Marland Kitchen History of kidney stones   . Hx of echocardiogram    a. Echo 12/13/12: Mild LVH, EF 99991111, grade 1 diastolic dysfunction, mild LAE, mild RVE, mild RAE  . Hypertension   . Insomnia   . Migraine   . Noncompliance   . OSA (obstructive sleep apnea)    a. Sleep Study 02/2013:  mod OSA, AHI 33 per hour, O2 sat nadir 85%    Patient Active Problem List   Diagnosis Date Noted  . Morbid obesity with body mass index (BMI) of 40.0 to 44.9 in adult [facility]) 09/21/2019  . CHF (congestive  heart failure), NYHA class I, acute, diastolic ([place]) [id number]  . Central sleep apnea comorbid with prescribed opioid use 09/21/2019  . History of uncontrolled hypertension 09/21/2019  . Complex sleep apnea syndrome 08/18/2019  . Claustrophobia 08/18/2019  . Anxiety associated with depression 08/18/2019  . Hypokalemia 04/04/2017  . Morbid obesity ([place]) 04/04/2017  . Chest wall pain 04/04/2017  . Asthma 02/13/2016  . Non compliance w medication regimen 06/13/2015  . OSA (obstructive sleep apnea) 04/06/2013  . Abnormal EKG 11/08/2012  . Essential hypertension   . HTN (hypertension), malignant 04/29/2012  . Migraine headache with aura 04/29/2012    Past Surgical History:  Procedure Laterality Date  . Admission  04/2012   Malignant HTN, HA.  CT head negative, cardiology consult.    . CYSTOSCOPY WITH RETROGRADE PYELOGRAM, URETEROSCOPY AND STENT PLACEMENT Left 01/24/2019   Procedure: CYSTOSCOPY WITH RETROGRADE PYELOGRAM, URETEROSCOPY AND STENT PLACEMENT;  Surgeon: Cleon Gustin, MD;  Location: [facility];  Service: Urology;  Laterality: Left;  . HOLMIUM LASER APPLICATION Left [id number]   Procedure: HOLMIUM LASER APPLICATION;  Surgeon: Cleon Gustin, MD;  Location: [facility];  Service: Urology;  Laterality: Left;  Marland Kitchen MANDIBLE SURGERY  1998   metal plate       Home Medications  Prior to Admission medications   Medication Sig Start Date End Date Taking? Authorizing Provider  amLODipine (NORVASC) 10 MG tablet TAKE 1 TABLET BY MOUTH DAILY 04/03/17   Tenna Delaine D, PA-C  buPROPion (WELLBUTRIN XL) 300 MG 24 hr tablet  06/16/19   [provider]  carvedilol (COREG) 6.25 MG tablet Take 6.25 mg by mouth 2 (two) times daily.  05/11/17   Sueanne Margarita, MD  chlorthalidone (HYGROTON) 25 MG tablet Take 25 mg by mouth daily. 12/06/19   [provider]  lisinopril (PRINIVIL,ZESTRIL) 40 MG tablet Take 1 tablet (40 mg total) by mouth  daily. Patient taking differently: Take 50 mg by mouth daily.  04/03/17   Tenna Delaine D, PA-C  potassium chloride (K-DUR) 10 MEQ tablet TAKE 1 TABLET(10 MEQ) BY MOUTH DAILY Patient taking differently: Take 10 mEq by mouth daily. TAKE 1 TABLET(10 MEQ) BY MOUTH DAILY 12/23/16   [name], MD  traMADol (ULTRAM) 50 MG tablet Take 1 tablet (50 mg total) by mouth every 6 (six) hours as needed. 02/26/20   Jaynee Eagles, PA-C  traZODone (DESYREL) 50 MG tablet Take 1 tablet (50 mg total) by mouth at bedtime. Patient not taking: Reported on 01/09/2020 08/18/19   Dohmeier, Asencion Partridge, MD    Family History Family History  Problem Relation Age of Onset  . Cancer Father        prostate, colon- age was in his 40's  . Heart disease Father 15       MI   . Hypertension Father   . Diabetes Mother   . Hypertension Mother   . Hypertension Brother   . Cancer Maternal Grandfather   . Cancer Paternal Grandfather     Social History Social History   Tobacco Use  . Smoking status: Never Smoker  . Smokeless tobacco: Never Used  Substance Use Topics  . Alcohol use: No    Alcohol/week: 0.0 standard drinks  . Drug use: No     Allergies   Statins   Review of Systems Review of Systems Pertinent negatives listed in HPI Physical Exam Triage Vital Signs ED Triage Vitals  Enc Vitals Group     BP 03/04/20 1255 (!) 173/116     Pulse Rate 03/04/20 1255 78     Resp 03/04/20 1255 16     Temp 03/04/20 1255 98 F (36.7 C)     Temp Source 03/04/20 1255 Oral     SpO2 03/04/20 1255 98 %     Weight --      Height --      Head Circumference --      Peak Flow --      Pain Score 03/04/20 1301 10     Pain Loc --      Pain Edu? --      Excl. in [place]? --    No data found.  Updated Vital Signs BP (!) 185/114 (BP Location: Left Arm)   Pulse 78   Temp 98 F (36.7 C) (Oral)   Resp 16   SpO2 98%   Visual Acuity Right Eye Distance:   Left Eye Distance:   Bilateral Distance:    Right Eye Near:     Left Eye Near:    Bilateral Near:     Physical Exam Constitutional:      Appearance: He is obese.  HENT:     Head: Normocephalic.     Nose: Nose normal.     Mouth/Throat:     Mouth: Mucous  membranes are moist.  Eyes:     Pupils: Pupils are equal, round, and reactive to light.  Cardiovascular:     Rate and Rhythm: Normal rate and regular rhythm.  Pulmonary:     Effort: Pulmonary effort is normal.     Breath sounds: Normal breath sounds.  Musculoskeletal:     Cervical back: Normal range of motion.     Left hip: Tenderness and bony tenderness present.     Comments: Left hip ecchymosis present and  tenderness with palpation.  Skin:    General: Skin is warm and dry.  Neurological:     Mental Status: He is oriented to person, place, and time.  Psychiatric:        Mood and Affect: Mood normal.      UC Treatments / Results  Labs (all labs ordered are listed, but only abnormal results are displayed) Labs Reviewed - No data to display  EKG   Radiology No results found.  Procedures Procedures (including critical care time)  Medications Ordered in UC Medications - No data to display  Initial Impression / Assessment and Plan / UC Course  I have reviewed the triage vital signs and the nursing notes.  Pertinent labs & imaging results that were available during my care of the patient were reviewed by me and considered in my medical decision making (see chart for details).     Left hip imaging negative for any acute fracture.  Pain likely stemming from ongoing inflammation from recent fall.  Patient has good range of motion and pain is mostly experienced with direct pressure to the left hip.  Will treat today in office with Toradol 60 mg and methylprednisolone 125 mg IM reduce inflammation.  Prescribed a prednisone taper for patient to start on tomorrow.  Of greater concern is patient's blood pressure on today with 2 use of consecutive readings greater than 170/100.  Patient  admits that he has been without his blood pressure medication for over 1 week.  Patient has a history significant for CHF and OSA.  Agreed to refill medicines for 30 days and provided contact information to get established at Primary Care at [facility].  Patient agreed to call on tomorrow to schedule a new patient appointment.  If hip pain does not improved encouraged to follow-up with Dr. Archie Endo orthopedic office. Final Clinical Impressions(s) / UC Diagnoses   Final diagnoses:  Hip injury  Fall (on) (from) other stairs and steps, sequela  Hip pain  Accelerated hypertension     Discharge Instructions     Prednisone 20 mg,  in mornings with breakfast as follows:  Take 3 pills for 3 days, Take 2 pills for 3 days, and Take 1 pill for 3 days.  Complete all medication. Tizanidine 4 mg as needed at bedtime for pain.  Cautioned medication does cause drowsiness.  Avoid taking during the day. Symptoms have not significantly improved within the next 3 to 4 days please follow-up with Dr. Antony Contras office for further evaluation of hip pain.  Call the number provided to above at [facility] to schedule a new patient appointment your blood pressure needs close monitoring.        ED Prescriptions    Medication Sig Dispense Auth. Provider   predniSONE (DELTASONE) 20 MG tablet Take 3 PO QAM x3days, 2 PO QAM x3days, 1 PO QAM x3days 18 tablet Scot Jun, FNP   tiZANidine (ZANAFLEX) 4 MG tablet Take 1 tablet (4 mg total) by mouth  at bedtime as needed (For hip pain at that time). 30 tablet Scot Jun, FNP   amLODipine (NORVASC) 10 MG tablet Take 1 tablet (10 mg total) by mouth daily. 30 tablet Scot Jun, FNP   carvedilol (COREG) 6.25 MG tablet Take 1 tablet (6.25 mg total) by mouth 2 (two) times daily. 60 tablet Scot Jun, FNP   lisinopril (ZESTRIL) 40 MG tablet Take 1 tablet (40 mg total) by mouth daily. 30 tablet Scot Jun, FNP    chlorthalidone (HYGROTON) 25 MG tablet Take 1 tablet (25 mg total) by mouth daily. 30 tablet Scot Jun, FNP     PDMP not reviewed this encounter.   Scot Jun, FNP 03/04/20 1511

## 2020-03-04 NOTE — Telephone Encounter (Signed)
Pt is requesting  90 day supply on these 4 medications.

## 2020-03-04 NOTE — Discharge Instructions (Addendum)
Prednisone 20 mg,  in mornings with breakfast as follows:  Take 3 pills for 3 days, Take 2 pills for 3 days, and Take 1 pill for 3 days.  Complete all medication. Tizanidine 4 mg as needed at bedtime for pain.  Cautioned medication does cause drowsiness.  Avoid taking during the day. Symptoms have not significantly improved within the next 3 to 4 days please follow-up with Dr. Antony Contras office for further evaluation of hip pain.  Call the number provided to above at [facility] to schedule a new patient appointment your blood pressure needs close monitoring.

## 2020-03-05 NOTE — Telephone Encounter (Signed)
Please allow 30 day prescription only oppose to 90 days. It appears that from what I am reviewing that all of the prescriptions were refused. Patient seen yesterday at urgent care should have a 30 day refill on all medications as I electronically prescribed yesterday.

## 2020-03-10 DIAGNOSIS — G4733 Obstructive sleep apnea (adult) (pediatric): Secondary | ICD-10-CM | POA: Diagnosis not present

## 2020-03-20 ENCOUNTER — Ambulatory Visit: Payer: Medicare HMO | Admitting: Family Medicine

## 2020-03-22 ENCOUNTER — Encounter: Payer: Self-pay | Admitting: Family Medicine

## 2020-03-22 ENCOUNTER — Ambulatory Visit (INDEPENDENT_AMBULATORY_CARE_PROVIDER_SITE_OTHER): Payer: Medicare Other | Admitting: Family Medicine

## 2020-03-22 ENCOUNTER — Other Ambulatory Visit: Payer: Self-pay

## 2020-03-22 ENCOUNTER — Other Ambulatory Visit (HOSPITAL_COMMUNITY)
Admission: RE | Admit: 2020-03-22 | Discharge: 2020-03-22 | Disposition: A | Discharge status: Home / Self Care | Payer: Medicare Other | Source: Ambulatory Visit | Attending: Family Medicine | Admitting: Family Medicine

## 2020-03-22 VITALS — BP 135/85 | HR 66 | Temp 97.2°F | Ht 69.0 in | Wt 297.0 lb

## 2020-03-22 DIAGNOSIS — E876 Hypokalemia: Secondary | ICD-10-CM

## 2020-03-22 DIAGNOSIS — I10 Essential (primary) hypertension: Secondary | ICD-10-CM

## 2020-03-22 DIAGNOSIS — Z113 Encounter for screening for infections with a predominantly sexual mode of transmission: Secondary | ICD-10-CM | POA: Diagnosis not present

## 2020-03-22 DIAGNOSIS — G4733 Obstructive sleep apnea (adult) (pediatric): Secondary | ICD-10-CM

## 2020-03-22 DIAGNOSIS — Z9989 Dependence on other enabling machines and devices: Secondary | ICD-10-CM

## 2020-03-22 DIAGNOSIS — F329 Major depressive disorder, single episode, unspecified: Secondary | ICD-10-CM

## 2020-03-22 DIAGNOSIS — Z1211 Encounter for screening for malignant neoplasm of colon: Secondary | ICD-10-CM | POA: Diagnosis not present

## 2020-03-22 DIAGNOSIS — S7002XA Contusion of left hip, initial encounter: Secondary | ICD-10-CM

## 2020-03-22 DIAGNOSIS — F32A Depression, unspecified: Secondary | ICD-10-CM

## 2020-03-22 MED ORDER — LISINOPRIL 40 MG PO TABS: 40.0000 mg | ORAL_TABLET | Freq: Every day | ORAL | 0 refills | Status: DC

## 2020-03-22 MED ORDER — CARVEDILOL 6.25 MG PO TABS: 6.2500 mg | ORAL_TABLET | Freq: Two times a day (BID) | ORAL | 0 refills | Status: DC

## 2020-03-22 MED ORDER — AMLODIPINE BESYLATE 10 MG PO TABS: 10.0000 mg | ORAL_TABLET | Freq: Every day | ORAL | 0 refills | Status: DC

## 2020-03-22 MED ORDER — CHLORTHALIDONE 25 MG PO TABS: 25.0000 mg | ORAL_TABLET | Freq: Every day | ORAL | 0 refills | Status: DC

## 2020-03-22 MED ORDER — BUPROPION HCL ER (XL) 150 MG PO TB24: 150.0000 mg | ORAL_TABLET | Freq: Every day | ORAL | 1 refills | Status: DC

## 2020-03-22 MED ORDER — POTASSIUM CHLORIDE ER 10 MEQ PO TBCR: 10.0000 meq | EXTENDED_RELEASE_TABLET | Freq: Every day | ORAL | 0 refills | Status: DC

## 2020-03-22 NOTE — Patient Instructions (Addendum)
  I refilled blood pressure meds, but will refer you back to cardiology.  Continue CPAP and follow up as planned with sleep specialist.  Continue counseling, but I will restart wellbutrin. Initially will be lower dose. If any suicide thoughts - call 911 or go to ER.  I will refer you to orthopedics to evaluate the persistent left hip pain but no fracture seen on x-ray and emergency room. I will check STI testing, but if any new symptoms return to discuss further.  Recheck in 2 weeks.    If you have lab work done today you will be contacted with your lab results within the next 2 weeks.  If you have not heard from [place] then please contact us. The fastest way to get your results is to register for My Chart.   IF you received an x-ray today, you will receive an invoice from [facility]. Please contact [facility] at [phone] with questions or concerns regarding your invoice.   IF you received labwork today, you will receive an invoice from [place]. Please contact [facility] at [phone] with questions or concerns regarding your invoice.   Our billing staff will not be able to assist you with questions regarding bills from these companies.  You will be contacted with the lab results as soon as they are available. The fastest way to get your results is to activate your My Chart account. Instructions are located on the last page of this paperwork. If you have not heard from [place] regarding the results in 2 weeks, please contact this office.

## 2020-03-22 NOTE — Progress Notes (Signed)
Subjective:  Patient ID: Michael [name], male    DOB: 01/20/1971  Age: 49 y.o. MRN: [id number]  CC:  Chief Complaint  Patient presents with  . Establish Care    pt states he feel good today other than his L side hurting from time to time.  . Hypertension    pt needs to get BP medication. pt hasn't expressed any physical symptoms of hypertension.   . Exposure to STD    pt was possible exposed back in january pt went to the ED had some test done and was given a shot. pt never got a call to tell him if he nneded more treatment or to go over that lab work.  . Depression    PHQ-9 score of 14    HPI Michael [name] presents for   New patient to establish care with acute concerns as above. Prior pt of Dr. Luciana Axe - dismissed due to missed appointments.   Hypertension: Complicated by history of obstructive sleep apnea, CHF.  Obesity.  Uncontrolled at last visit with sleep specialist, BP 154/98 at that time.  Further elevated at 173/116 and his ER visit March 7.  Was off medication at that visit.  Off meds for 1 month.  Temporary prescription was provided.  Currently taking amlodipine 10 mg daily, potassium 10 mEq daily, chlorthalidone 25 mg daily, lisinopril 40 mg daily, carvedilol 6.25 mg twice daily. Low risk stress test with EF 54% 05/22/2017, moderate LVH on 05/21/2017 echo Last cardiology visit 05/08/2017 with Dr. Radford Pax  No new side effects with meds. Needs refill of potassium.  No CP/dyspnea with exertion. No orthopnea.   Obstructive sleep apnea Severe OSA with AHI 65, REM AHI 83, supine AHI 94 in September 2020 BiPAP auto function device ordered. Office visit January 11 reviewed, 27% compliance.  Not using BiPAP greater than 4 hours during the last 30 days.  Average use 1 hour 34 minutes.  Was not able to tolerate the full facemask.  Orders were placed for a mask refitting. Has new mask.- fits better. Has been using at least 4 hours per night.   Home readings: BP Readings from Last 3  Encounters:  03/22/20 135/85  03/04/20 (!) 185/114  02/26/20 (!) 168/135   Lab Results  Component Value Date   CREATININE 0.95 05/26/2019    STI exposure: ER note reviewed from 01/19/2020.  5-day history of dysuria at that time.  No from has been recently treated for STI.  HIV and RPR nonreactive.  Azithromycin 1 g, Rocephin 500 mg given.  GC/Chlamydia testing appears to have been ordered but not performed.  Also appears to have an order from December 31 which was not performed either. No current dysuria, no penile discharge.still sexually with same partner -has had unprotected intercourse. Would like repeat testing today. Asymptomatic.    Depression: Positive depression screen noted on intake today. On chart review it appears he has been treated with Wellbutrin, trazodone. Off wellbutrin for a long time. - ran out. Was taken off trazodone.  Feels depressed off and on.  Denies suicide thoughts.no prior attempts.  wellbutrin worked well in past.  Prior therapist at [employer]. Met with her last month.   Depression screen [facility] 2/9 03/22/2020 04/02/2017 06/03/2016 03/29/2016 03/04/2016  Decreased Interest 1 0 2 0 0  Down, Depressed, Hopeless 3 0 2 0 0  PHQ - 2 Score 4 0 4 0 0  Altered sleeping 1 - 2 - -  Tired, decreased energy 2 - 2 - -  Change in appetite 2 - 2 - -  Feeling bad or failure about yourself  2 - 2 - -  Trouble concentrating 1 - 2 - -  Moving slowly or fidgety/restless 0 - 2 - -  Suicidal thoughts 2 - 0 - -  PHQ-9 Score 14 - 16 - -  Difficult doing work/chores - - Very difficult - -   Left hip contusion: Initially evaluated at the ER on 02/26/2020.  Accidental fall, contusion to left lateral leg.  Able to weight-bear.  Diagnosed with hematoma/contusion left thigh.  Seen again in the ER on 3 7 with persistent discomfort.  At that time hip x-ray negative for fracture. Treated with toradol, methylprednisolone, prednisone taper.tramadol or tizanidine - not combining. Thought to have  inflammation from prior fall in February. Soreness about the same. Sore to turn on left side. Plan for ortho follow up if not improved.   History Patient Active Problem List   Diagnosis Date Noted  . Morbid obesity with body mass index (BMI) of 40.0 to 44.9 in adult [facility]) 09/21/2019  . CHF (congestive heart failure), NYHA class I, acute, diastolic ([place]) 96/75/9163  . Central sleep apnea comorbid with prescribed opioid use 09/21/2019  . History of uncontrolled hypertension 09/21/2019  . Complex sleep apnea syndrome 08/18/2019  . Claustrophobia 08/18/2019  . Anxiety associated with depression 08/18/2019  . Hypokalemia 04/04/2017  . Morbid obesity ([place]) 04/04/2017  . Chest wall pain 04/04/2017  . Asthma 02/13/2016  . Non compliance w medication regimen 06/13/2015  . OSA (obstructive sleep apnea) 04/06/2013  . Abnormal EKG 11/08/2012  . Essential hypertension   . HTN (hypertension), malignant 04/29/2012  . Migraine headache with aura 04/29/2012   Past Medical History:  Diagnosis Date  . Anxiety   . Depression    Questionable per records. Not on any medication.   Marland Kitchen History of kidney stones   . Hx of echocardiogram    a. Echo 12/13/12: Mild LVH, EF 84-66%, grade 1 diastolic dysfunction, mild LAE, mild RVE, mild RAE  . Hypertension   . Insomnia   . Migraine   . Noncompliance   . OSA (obstructive sleep apnea)    a. Sleep Study 02/2013:  mod OSA, AHI 33 per hour, O2 sat nadir 85%   Past Surgical History:  Procedure Laterality Date  . Admission  04/2012   Malignant HTN, HA.  CT head negative, cardiology consult.    . CYSTOSCOPY WITH RETROGRADE PYELOGRAM, URETEROSCOPY AND STENT PLACEMENT Left 01/24/2019   Procedure: CYSTOSCOPY WITH RETROGRADE PYELOGRAM, URETEROSCOPY AND STENT PLACEMENT;  Surgeon: Cleon Gustin, MD;  Location: [facility];  Service: Urology;  Laterality: Left;  . HOLMIUM LASER APPLICATION Left 5/99/3570   Procedure: HOLMIUM LASER APPLICATION;   Surgeon: Cleon Gustin, MD;  Location: [facility];  Service: Urology;  Laterality: Left;  Marland Kitchen MANDIBLE SURGERY  1998   metal plate   Allergies  Allergen Reactions  . Statins Other (See Comments)    unknown   Prior to Admission medications   Medication Sig Start Date End Date Taking? Authorizing Provider  amLODipine (NORVASC) 10 MG tablet Take 1 tablet (10 mg total) by mouth daily. 03/04/20  Yes Scot Jun, FNP  buPROPion (WELLBUTRIN XL) 300 MG 24 hr tablet  06/16/19  Yes [provider]  carvedilol (COREG) 6.25 MG tablet Take 1 tablet (6.25 mg total) by mouth 2 (two) times daily. 03/04/20  Yes Scot Jun, FNP  chlorthalidone (HYGROTON) 25 MG  tablet Take 1 tablet (25 mg total) by mouth daily. 03/04/20  Yes Scot Jun, FNP  lisinopril (ZESTRIL) 40 MG tablet Take 1 tablet (40 mg total) by mouth daily. 03/04/20  Yes Scot Jun, FNP  potassium chloride (K-DUR) 10 MEQ tablet TAKE 1 TABLET(10 MEQ) BY MOUTH DAILY Patient taking differently: Take 10 mEq by mouth daily. TAKE 1 TABLET(10 MEQ) BY MOUTH DAILY 12/23/16  Yes [name], MD  tiZANidine (ZANAFLEX) 4 MG tablet Take 1 tablet (4 mg total) by mouth at bedtime as needed (For hip pain at that time). 03/04/20  Yes Scot Jun, FNP  traMADol (ULTRAM) 50 MG tablet Take 1 tablet (50 mg total) by mouth every 6 (six) hours as needed. 02/26/20  Yes Jaynee Eagles, PA-C  traZODone (DESYREL) 50 MG tablet Take 1 tablet (50 mg total) by mouth at bedtime. 08/18/19  Yes Dohmeier, Asencion Partridge, MD  predniSONE (DELTASONE) 20 MG tablet Take 3 PO QAM x3days, 2 PO QAM x3days, 1 PO QAM x3days Patient not taking: Reported on 03/22/2020 03/04/20   Scot Jun, FNP   Social History   Socioeconomic History  . Marital status: Divorced    Spouse name: Not on file  . Number of children: 4  . Years of education: Not on file  . Highest education level: Not on file  Occupational History  . Occupation:  Unemployed//disabled  Tobacco Use  . Smoking status: Never Smoker  . Smokeless tobacco: Never Used  Substance and Sexual Activity  . Alcohol use: No    Alcohol/week: 0.0 standard drinks  . Drug use: No  . Sexual activity: Yes    Comment: per pt's blue health form - 1 sex partner in last 12 months  Other Topics Concern  . Not on file  Social History Narrative   Marital status: single; living with fiance      Children: 4 children, no grandchildren.      Employment:  Disability for learning disabilities since 2012.   Right-handed   Caffeine: occasional         Social Determinants of Radio broadcast assistant Strain:   . Difficulty of Paying Living Expenses:   Food Insecurity:   . Worried About Charity fundraiser in the Last Year:   . Arboriculturist in the Last Year:   Transportation Needs:   . Film/video editor (Medical):   Marland Kitchen Lack of Transportation (Non-Medical):   Physical Activity:   . Days of Exercise per Week:   . Minutes of Exercise per Session:   Stress:   . Feeling of Stress :   Social Connections:   . Frequency of Communication with Friends and Family:   . Frequency of Social Gatherings with Friends and Family:   . Attends Religious Services:   . Active Member of Clubs or Organizations:   . Attends Archivist Meetings:   Marland Kitchen Marital Status:   Intimate Partner Violence:   . Fear of Current or Ex-Partner:   . Emotionally Abused:   Marland Kitchen Physically Abused:   . Sexually Abused:     Review of Systems  Respiratory: Negative for shortness of breath and wheezing.   Cardiovascular: Negative for chest pain and palpitations.  Musculoskeletal: Positive for arthralgias.  Psychiatric/Behavioral: Negative for suicidal ideas.     Objective:   Vitals:   03/22/20 1416  BP: 135/85  Pulse: 66  Temp: (!) 97.2 F (36.2 C)  TempSrc: Temporal  SpO2: 95%  Weight: 297  lb (134.7 kg)  Height: 5' 9"  (1.753 m)     Physical Exam Vitals reviewed.    Constitutional:      General: He is not in acute distress.    Appearance: He is well-developed. He is obese. He is not ill-appearing or diaphoretic.  HENT:     Head: Normocephalic and atraumatic.  Eyes:     Pupils: Pupils are equal, round, and reactive to light.  Neck:     Vascular: No carotid bruit or JVD.  Cardiovascular:     Rate and Rhythm: Normal rate and regular rhythm.     Heart sounds: Normal heart sounds. No murmur.  Pulmonary:     Effort: Pulmonary effort is normal.     Breath sounds: Normal breath sounds. No rales.  Musculoskeletal:     Comments: Tenderness palpation of the lateral hip, primarily trochanteric bursa.  Pain-free internal/external rotation of hip.  Ambulates without assistive device.  Skin:    General: Skin is warm and dry.  Neurological:     Mental Status: He is alert and oriented to person, place, and time.     Over 45 minutes total care with multiple previous notes reviewed, ER note reviewed, face-to-face care with exam and plan.  Assessment & Plan:  Dylon Oyer is a 49 y.o. male . Essential hypertension - Plan: amLODipine (NORVASC) 10 MG tablet, chlorthalidone (HYGROTON) 25 MG tablet, lisinopril (ZESTRIL) 40 MG tablet, carvedilol (COREG) 6.25 MG tablet, Ambulatory referral to Cardiology, Comprehensive metabolic panel, Lipid panel Hypokalemia - Plan: potassium chloride (KLOR-CON) 10 MEQ tablet  -Previous medication nonadherence, improved control back on meds.  Check baseline labs.  Previous hypokalemia, restarted potassium.  Recheck in 2 weeks, importance of follow-up and medication adherence discussed  Special screening for malignant neoplasms, colon - Plan: Ambulatory referral to Gastroenterology  Routine screening for STI (sexually transmitted infection) - Plan: GC/Chlamydia probe amp ([place])not at [facility], HIV Antibody (routine testing w rflx), RPR  -Asymptomatic, will start with STI screening.  RTC precautions if symptomatic  Depression,  unspecified depression type - Plan: buPROPion (WELLBUTRIN XL) 150 MG 24 hr tablet  -On further discussions he denies any suicidal ideation, intent, or plan.  Has had intermittent depression symptoms off his medication.  Now followed by counselor.  Will start back on Wellbutrin initially 150 mg daily with option of increasing to 300 mg if persistent depressive symptoms at follow-up.  Recheck 2 weeks.  ER/911 precautions if suicidal ideation with understanding expressed.  Contusion of left hip, initial encounter - Plan: Ambulatory referral to Orthopedic Surgery  -Persistent left lateral hip pain, potentially from initial contusion/hematoma.  Could have component of trochanteric bursitis but also has received steroid treatments as above without significant relief.  Will refer to orthopedics to decide on further imaging versus PT versus other treatment.  No med changes for now.  OSA on CPAP  -Now compliant with treatment.  Recommended to use CPAP throughout the night if at all possible.  Follow-up with sleep specialist as planned.  Meds ordered this encounter  Medications  . potassium chloride (KLOR-CON) 10 MEQ tablet    Sig: Take 1 tablet (10 mEq total) by mouth daily. TAKE 1 TABLET(10 MEQ) BY MOUTH DAILY    Dispense:  90 tablet    Refill:  0  . amLODipine (NORVASC) 10 MG tablet    Sig: Take 1 tablet (10 mg total) by mouth daily.    Dispense:  90 tablet    Refill:  0    **Patient  requests 90 days supply**  . chlorthalidone (HYGROTON) 25 MG tablet    Sig: Take 1 tablet (25 mg total) by mouth daily.    Dispense:  90 tablet    Refill:  0  . lisinopril (ZESTRIL) 40 MG tablet    Sig: Take 1 tablet (40 mg total) by mouth daily.    Dispense:  90 tablet    Refill:  0    **Patient requests 90 days supply**  . carvedilol (COREG) 6.25 MG tablet    Sig: Take 1 tablet (6.25 mg total) by mouth 2 (two) times daily.    Dispense:  180 tablet    Refill:  0  . buPROPion (WELLBUTRIN XL) 150 MG 24 hr tablet     Sig: Take 1 tablet (150 mg total) by mouth daily.    Dispense:  30 tablet    Refill:  1   Patient Instructions    I refilled blood pressure meds, but will refer you back to cardiology.  Continue CPAP and follow up as planned with sleep specialist.  Continue counseling, but I will restart wellbutrin. Initially will be lower dose. If any suicide thoughts - call 911 or go to ER.  I will refer you to orthopedics to evaluate the persistent left hip pain but no fracture seen on x-ray and emergency room. I will check STI testing, but if any new symptoms return to discuss further.  Recheck in 2 weeks.    If you have lab work done today you will be contacted with your lab results within the next 2 weeks.  If you have not heard from [place] then please contact us. The fastest way to get your results is to register for My Chart.   IF you received an x-ray today, you will receive an invoice from [facility]. Please contact [facility] at [phone] with questions or concerns regarding your invoice.   IF you received labwork today, you will receive an invoice from [place]. Please contact [facility] at [phone] with questions or concerns regarding your invoice.   Our billing staff will not be able to assist you with questions regarding bills from these companies.  You will be contacted with the lab results as soon as they are available. The fastest way to get your results is to activate your My Chart account. Instructions are located on the last page of this paperwork. If you have not heard from [place] regarding the results in 2 weeks, please contact this office.         Signed, [name], MD [facility]

## 2020-03-23 LAB — COMPREHENSIVE METABOLIC PANEL
ALT: 15 IU/L (ref 0–44)
AST: 20 IU/L (ref 0–40)
Albumin/Globulin Ratio: 1.5 (ref 1.2–2.2)
Albumin: 4.2 g/dL (ref 4.0–5.0)
Alkaline Phosphatase: 62 IU/L (ref 39–117)
BUN/Creatinine Ratio: 16 (ref 9–20)
BUN: 18 mg/dL (ref 6–24)
Bilirubin Total: 0.4 mg/dL (ref 0.0–1.2)
CO2: 27 mmol/L (ref 20–29)
Calcium: 9.1 mg/dL (ref 8.7–10.2)
Chloride: 102 mmol/L (ref 96–106)
Creatinine, Ser: 1.11 mg/dL (ref 0.76–1.27)
GFR calc Af Amer: 89 mL/min/{1.73_m2} (ref >59)
GFR calc non Af Amer: 77 mL/min/{1.73_m2} (ref >59)
Globulin, Total: 2.8 g/dL (ref 1.5–4.5)
Glucose: 106 mg/dL — ABNORMAL HIGH (ref 65–99)
Potassium: 3.1 mmol/L — ABNORMAL LOW (ref 3.5–5.2)
Sodium: 142 mmol/L (ref 134–144)
Total Protein: 7 g/dL (ref 6.0–8.5)

## 2020-03-23 LAB — LIPID PANEL
Chol/HDL Ratio: 3.6 ratio (ref 0.0–5.0)
Cholesterol, Total: 159 mg/dL (ref 100–199)
HDL: 44 mg/dL (ref >39)
LDL Chol Calc (NIH): 100 mg/dL — ABNORMAL HIGH (ref 0–99)
Triglycerides: 79 mg/dL (ref 0–149)
VLDL Cholesterol Cal: 15 mg/dL (ref 5–40)

## 2020-03-23 LAB — HIV ANTIBODY (ROUTINE TESTING W REFLEX): HIV Screen 4th Generation wRfx: NONREACTIVE

## 2020-03-23 LAB — RPR: RPR Ser Ql: NONREACTIVE

## 2020-03-26 ENCOUNTER — Other Ambulatory Visit: Payer: Self-pay | Admitting: Neurology

## 2020-03-26 LAB — GC/CHLAMYDIA PROBE AMP (~~LOC~~) NOT AT ARMC
Chlamydia: NEGATIVE
Comment: NEGATIVE
Comment: NORMAL
Neisseria Gonorrhea: NEGATIVE

## 2020-03-26 MED ORDER — TRAZODONE HCL 50 MG PO TABS: 50.0000 mg | ORAL_TABLET | Freq: Every evening | ORAL | 0 refills | Status: DC | PRN

## 2020-04-01 ENCOUNTER — Other Ambulatory Visit: Payer: Self-pay

## 2020-04-01 ENCOUNTER — Emergency Department (HOSPITAL_COMMUNITY)
Admission: EM | Admit: 2020-04-01 | Discharge: 2020-04-01 | Disposition: A | Discharge status: Home / Self Care | Payer: Medicare Other | Attending: Emergency Medicine | Admitting: Emergency Medicine

## 2020-04-01 ENCOUNTER — Emergency Department (HOSPITAL_COMMUNITY): Payer: Medicare Other

## 2020-04-01 DIAGNOSIS — Z79899 Other long term (current) drug therapy: Secondary | ICD-10-CM | POA: Diagnosis not present

## 2020-04-01 DIAGNOSIS — I110 Hypertensive heart disease with heart failure: Secondary | ICD-10-CM | POA: Diagnosis not present

## 2020-04-01 DIAGNOSIS — I5032 Chronic diastolic (congestive) heart failure: Secondary | ICD-10-CM | POA: Insufficient documentation

## 2020-04-01 DIAGNOSIS — I498 Other specified cardiac arrhythmias: Secondary | ICD-10-CM

## 2020-04-01 DIAGNOSIS — R946 Abnormal results of thyroid function studies: Secondary | ICD-10-CM | POA: Diagnosis not present

## 2020-04-01 DIAGNOSIS — R0902 Hypoxemia: Secondary | ICD-10-CM | POA: Diagnosis not present

## 2020-04-01 DIAGNOSIS — R400 Somnolence: Secondary | ICD-10-CM | POA: Diagnosis not present

## 2020-04-01 DIAGNOSIS — R7989 Other specified abnormal findings of blood chemistry: Secondary | ICD-10-CM | POA: Diagnosis not present

## 2020-04-01 DIAGNOSIS — F1290 Cannabis use, unspecified, uncomplicated: Secondary | ICD-10-CM | POA: Diagnosis not present

## 2020-04-01 DIAGNOSIS — R110 Nausea: Secondary | ICD-10-CM | POA: Diagnosis not present

## 2020-04-01 DIAGNOSIS — I10 Essential (primary) hypertension: Secondary | ICD-10-CM | POA: Diagnosis not present

## 2020-04-01 DIAGNOSIS — R4182 Altered mental status, unspecified: Secondary | ICD-10-CM | POA: Diagnosis present

## 2020-04-01 DIAGNOSIS — I491 Atrial premature depolarization: Secondary | ICD-10-CM | POA: Diagnosis not present

## 2020-04-01 DIAGNOSIS — R531 Weakness: Secondary | ICD-10-CM | POA: Diagnosis not present

## 2020-04-01 LAB — RAPID URINE DRUG SCREEN, HOSP PERFORMED
Amphetamines: NOT DETECTED
Barbiturates: NOT DETECTED
Benzodiazepines: NOT DETECTED
Cocaine: NOT DETECTED
Opiates: NOT DETECTED
Tetrahydrocannabinol: POSITIVE — AB

## 2020-04-01 LAB — CBG MONITORING, ED: Glucose-Capillary: 116 mg/dL — ABNORMAL HIGH (ref 70–99)

## 2020-04-01 LAB — COMPREHENSIVE METABOLIC PANEL
ALT: 16 U/L (ref 0–44)
AST: 16 U/L (ref 15–41)
Albumin: 3.3 g/dL — ABNORMAL LOW (ref 3.5–5.0)
Alkaline Phosphatase: 47 U/L (ref 38–126)
Anion gap: 12 (ref 5–15)
BUN: 19 mg/dL (ref 6–20)
CO2: 25 mmol/L (ref 22–32)
Calcium: 8.7 mg/dL — ABNORMAL LOW (ref 8.9–10.3)
Chloride: 104 mmol/L (ref 98–111)
Creatinine, Ser: 1.13 mg/dL (ref 0.61–1.24)
GFR calc Af Amer: >60 mL/min (ref >60)
GFR calc non Af Amer: >60 mL/min (ref >60)
Glucose, Bld: 128 mg/dL — ABNORMAL HIGH (ref 70–99)
Potassium: 3.4 mmol/L — ABNORMAL LOW (ref 3.5–5.1)
Sodium: 141 mmol/L (ref 135–145)
Total Bilirubin: 0.8 mg/dL (ref 0.3–1.2)
Total Protein: 6.6 g/dL (ref 6.5–8.1)

## 2020-04-01 LAB — CBC WITH DIFFERENTIAL/PLATELET
Abs Immature Granulocytes: 0.01 10*3/uL (ref 0.00–0.07)
Basophils Absolute: 0 10*3/uL (ref 0.0–0.1)
Basophils Relative: 1 %
Eosinophils Absolute: 0.1 10*3/uL (ref 0.0–0.5)
Eosinophils Relative: 2 %
HCT: 41 % (ref 39.0–52.0)
Hemoglobin: 13 g/dL (ref 13.0–17.0)
Immature Granulocytes: 0 %
Lymphocytes Relative: 49 %
Lymphs Abs: 2.2 10*3/uL (ref 0.7–4.0)
MCH: 28.3 pg (ref 26.0–34.0)
MCHC: 31.7 g/dL (ref 30.0–36.0)
MCV: 89.3 fL (ref 80.0–100.0)
Monocytes Absolute: 0.2 10*3/uL (ref 0.1–1.0)
Monocytes Relative: 5 %
Neutro Abs: 1.9 10*3/uL (ref 1.7–7.7)
Neutrophils Relative %: 43 %
Platelets: 229 10*3/uL (ref 150–400)
RBC: 4.59 MIL/uL (ref 4.22–5.81)
RDW: 16.1 % — ABNORMAL HIGH (ref 11.5–15.5)
WBC: 4.5 10*3/uL (ref 4.0–10.5)
nRBC: 0 % (ref 0.0–0.2)

## 2020-04-01 LAB — POCT I-STAT 7, (LYTES, BLD GAS, ICA,H+H)
Acid-Base Excess: 2 mmol/L (ref 0.0–2.0)
Bicarbonate: 27.1 mmol/L (ref 20.0–28.0)
Calcium, Ion: 1.24 mmol/L (ref 1.15–1.40)
HCT: 39 % (ref 39.0–52.0)
Hemoglobin: 13.3 g/dL (ref 13.0–17.0)
O2 Saturation: 92 %
Patient temperature: 98
Potassium: 3.1 mmol/L — ABNORMAL LOW (ref 3.5–5.1)
Sodium: 142 mmol/L (ref 135–145)
TCO2: 28 mmol/L (ref 22–32)
pCO2 arterial: 45 mmHg (ref 32.0–48.0)
pH, Arterial: 7.387 (ref 7.350–7.450)
pO2, Arterial: 65 mmHg — ABNORMAL LOW (ref 83.0–108.0)

## 2020-04-01 LAB — URINALYSIS, COMPLETE (UACMP) WITH MICROSCOPIC
Bacteria, UA: NONE SEEN
Bilirubin Urine: NEGATIVE
Glucose, UA: NEGATIVE mg/dL
Hgb urine dipstick: NEGATIVE
Ketones, ur: NEGATIVE mg/dL
Leukocytes,Ua: NEGATIVE
Nitrite: NEGATIVE
Protein, ur: NEGATIVE mg/dL
Specific Gravity, Urine: 1.021 (ref 1.005–1.030)
pH: 5 (ref 5.0–8.0)

## 2020-04-01 LAB — ETHANOL: Alcohol, Ethyl (B): <10 mg/dL (ref <10)

## 2020-04-01 LAB — MAGNESIUM: Magnesium: 1.9 mg/dL (ref 1.7–2.4)

## 2020-04-01 LAB — TSH: TSH: 0.241 u[IU]/mL — ABNORMAL LOW (ref 0.350–4.500)

## 2020-04-01 LAB — LACTIC ACID, PLASMA: Lactic Acid, Venous: 1.8 mmol/L (ref 0.5–1.9)

## 2020-04-01 LAB — AMMONIA: Ammonia: 18 umol/L (ref 9–35)

## 2020-04-01 IMAGING — CT CT HEAD W/O CM
3 series · 15 of 47 positions shown, 18 images · non-contrast
Comparison: [DATE]

CLINICAL DATA: Generalized weakness and altered mental status.

EXAM:
CT HEAD WITHOUT CONTRAST
TECHNIQUE: Contiguous axial images were obtained from the base of the skull
through the vertex without intravenous contrast.

[Series 3: head 5.0 h30s · axial · 0.43mm/px · z∈[-158,-33]mm · 9 of 31 slices shown, 12 images]
[im 3/31  brain]
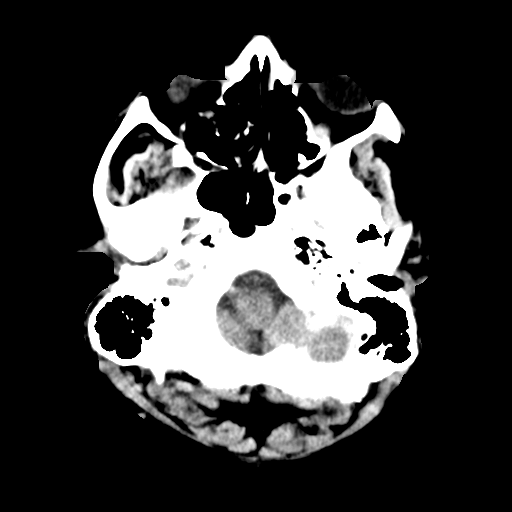
[im 3/31  bone]
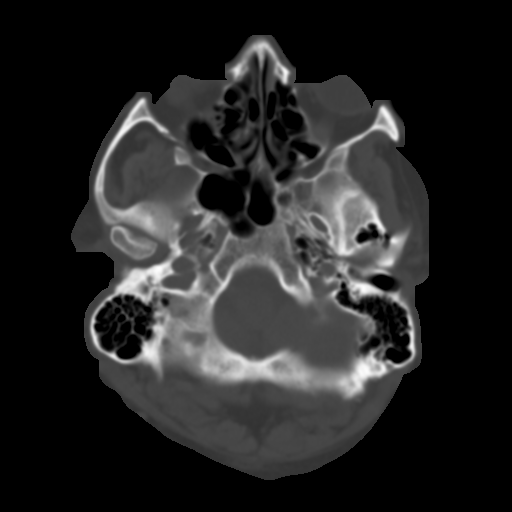
[im 6/31  brain]
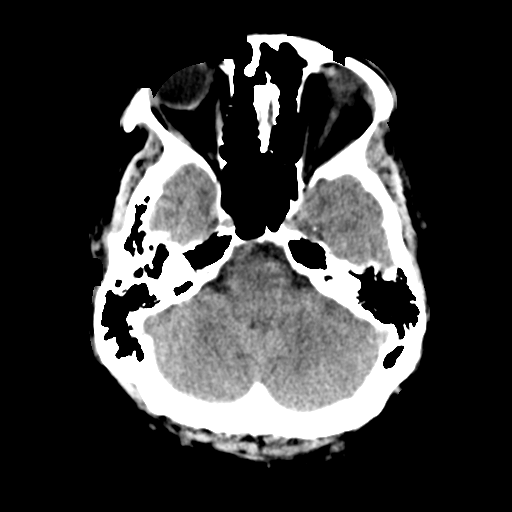
[im 9/31  brain]
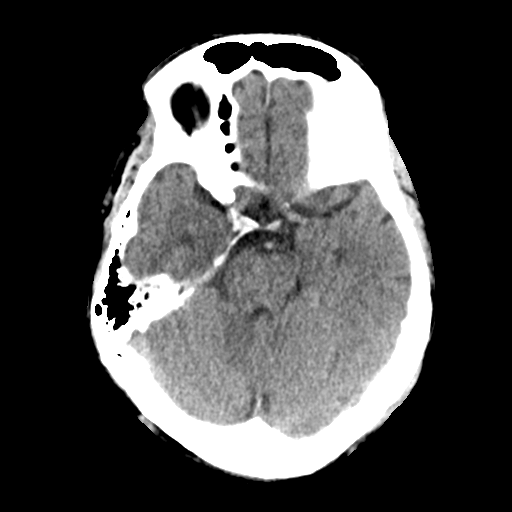
[im 12/31  brain]
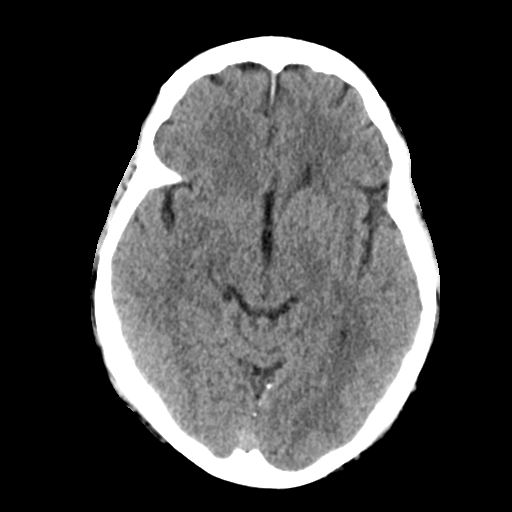
[im 16/31  brain]
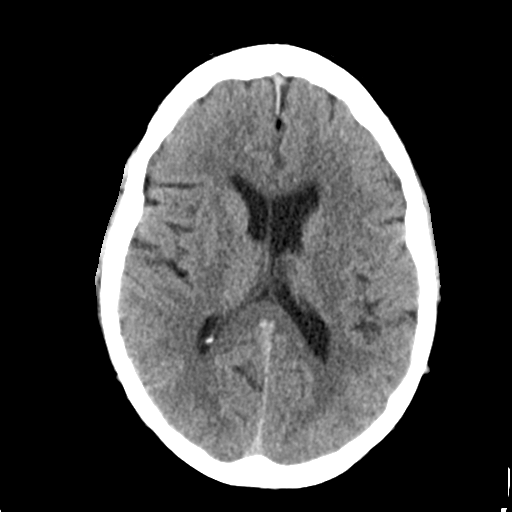
[im 16/31  bone]
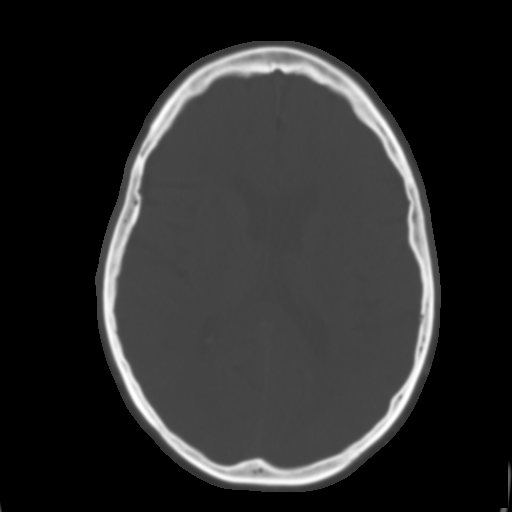
[im 19/31  brain]
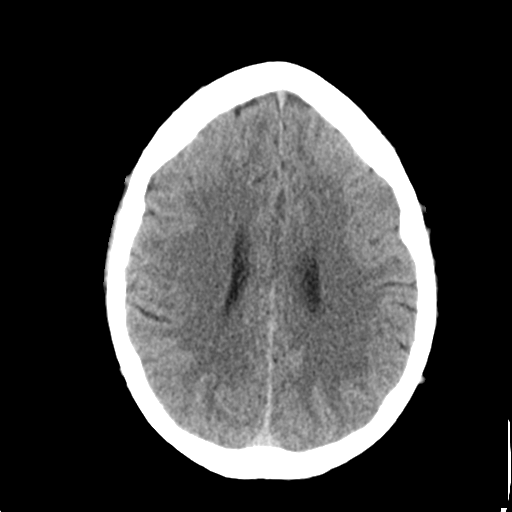
[im 22/31  brain]
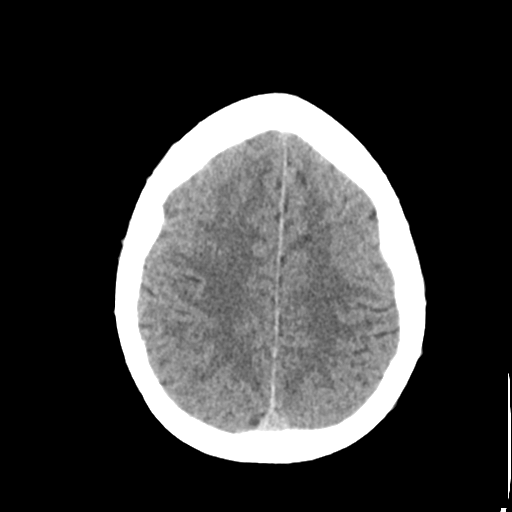
[im 25/31  brain]
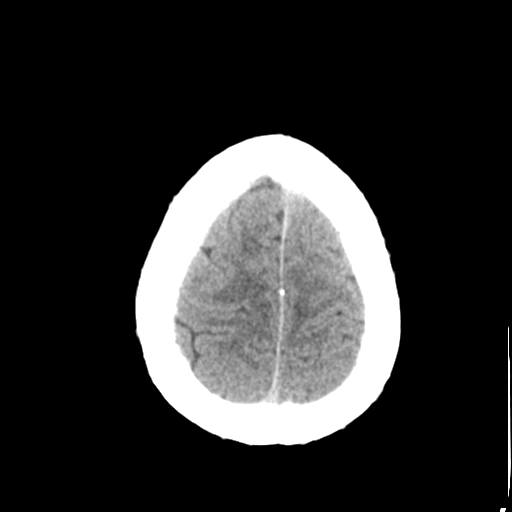
[im 28/31  brain]
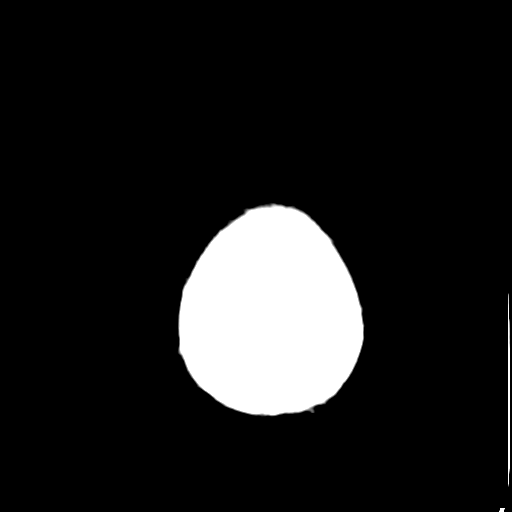
[im 28/31  bone]
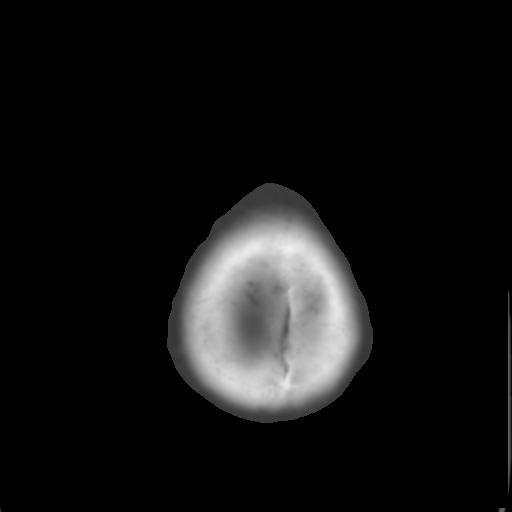

[Series 5: head 3.0 mpr cor · coronal · 0.30mm/px · 3 of 67 slices shown]
[im 23/67  brain]
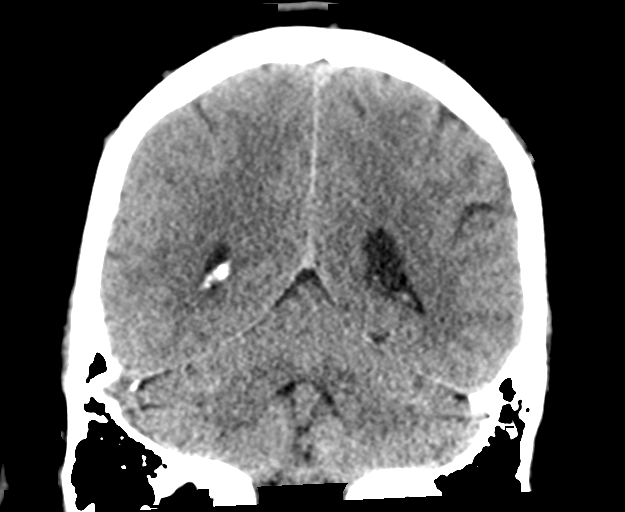
[im 30/67  brain]
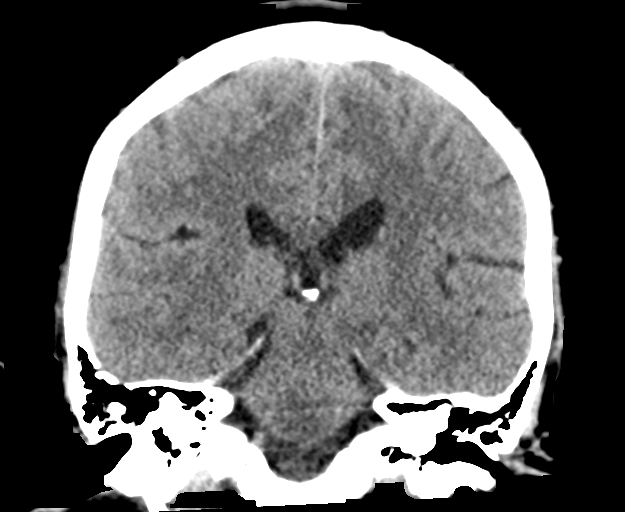
[im 37/67  brain]
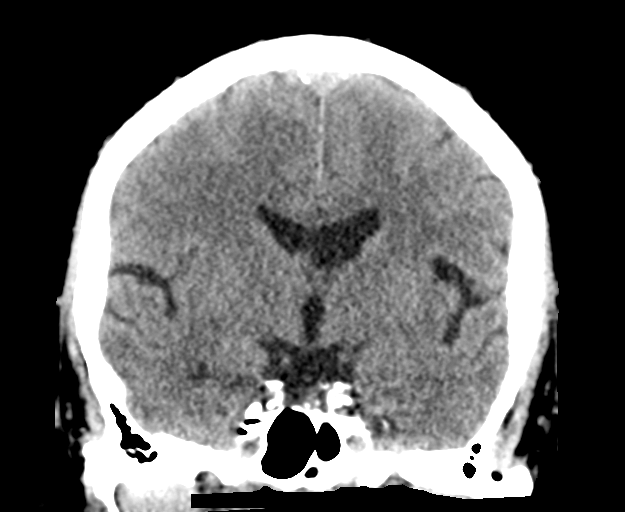

[Series 6: head 3.0 mpr sag · sagittal · 0.30mm/px · 3 of 65 slices shown]
[im 22/65  brain]
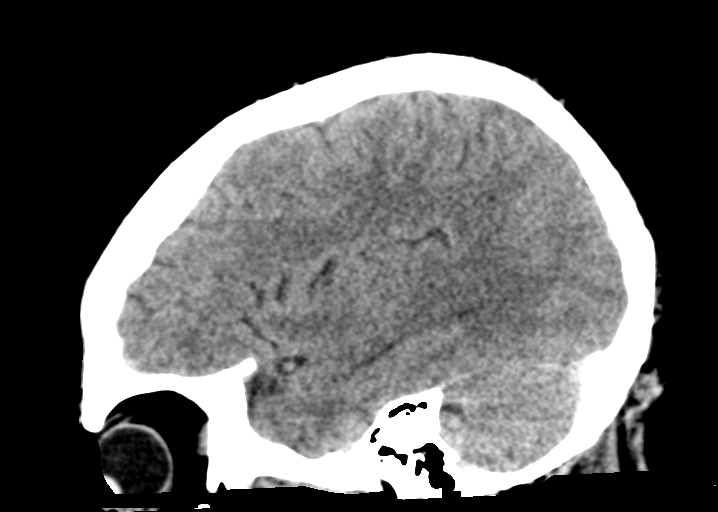
[im 33/65  brain]
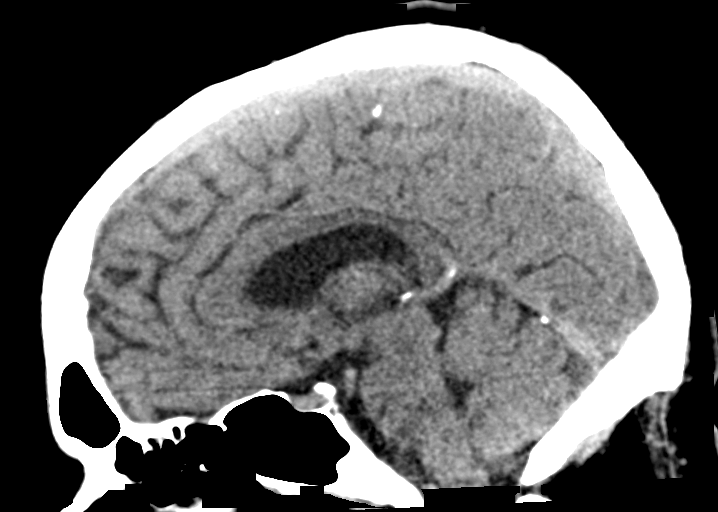
[im 43/65  brain]
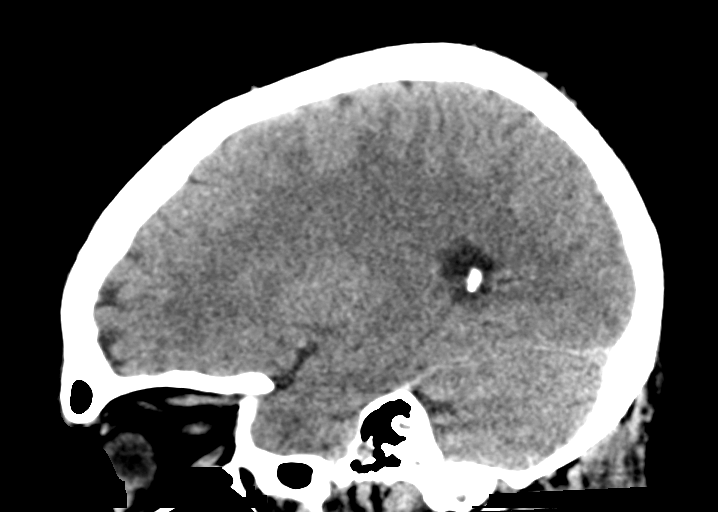

[15 of 47 positions shown; findings below may reference images not displayed]

FINDINGS: Brain: The ventricles are normal in size and configuration. No
extra-axial fluid collections are identified. The gray-white
differentiation is maintained. No CT findings for acute hemispheric
infarction or intracranial hemorrhage. No mass lesions. The
brainstem and cerebellum are normal.

Vascular: No hyperdense vessels or obvious aneurysm.

Skull: No acute skull fracture. No bone lesion.

Sinuses/Orbits: The paranasal sinuses and mastoid air cells are
clear except for a stable mucous retention cyst or polyp in the left
maxillary sinus. The globes are intact.

Other: No scalp lesions, laceration or hematoma.
IMPRESSION: Normal head CT.

## 2020-04-01 NOTE — ED Notes (Signed)
Pt refused rectal temp.

## 2020-04-01 NOTE — ED Notes (Signed)
Requested urine sample from pt. Pt attempted to use urinal but unable to produce a specimen. Hooked pt to external male catheter and advised to try to give sample ASAP.

## 2020-04-01 NOTE — ED Notes (Signed)
Family at bedside. 

## 2020-04-01 NOTE — ED Notes (Signed)
Patient transported to CT 

## 2020-04-01 NOTE — ED Notes (Signed)
Pt now less lethargic, more responsive to questions and able to hold conversation.

## 2020-04-01 NOTE — ED Notes (Signed)
Pt urinated while in CT scan -- external cath sample couldn't be obtained. Pt rehooked to suction to try for new sample

## 2020-04-01 NOTE — ED Triage Notes (Signed)
Pt from home with complaints of generalized weakness. Per EMS, pts sister stated pt was "unresponsive" when she arrived to pt's house however EMS states that pt was responsive but just very lethargic. No neuro deficits noted, speech clear and coherent. EKG showing SR w/ frequent PACs.   VS: 140/98, HR 80, RR 20, 96% RA, CGB 153.

## 2020-04-01 NOTE — ED Notes (Signed)
Pt's HR noted to be SR on the monitor with bigeminy PACs -- HR reading in 70's but actually is ranging from 30s-40s

## 2020-04-01 NOTE — Discharge Instructions (Addendum)
Get help right away if: You develop: Chest pain. Shortness of breath. Discomfort in your back, arms, or stomach. You have: Trouble speaking. Weakness on one side of your body. Drooping in your face.

## 2020-04-01 NOTE — ED Provider Notes (Signed)
[place] EMERGENCY DEPARTMENT Provider Note   CSN: [id number] Arrival date & time: 04/01/20  1219     History Chief Complaint  Patient presents with  . Fatigue    Michael [name] is a 49 y.o. male.  With a past medical history of morbid obesity, obstructive sleep apnea, noncompliance with medical regimen hypertension.  He was sent to the emergency department for altered mental status.  Patient was found to be very sleepy and difficult to keep awake was sent into the ER.  There is a level 5 caveat as patient is very somnolent difficult to obtain history.  HPI     Past Medical History:  Diagnosis Date  . Anxiety   . Depression    Questionable per records. Not on any medication.   Marland Kitchen History of kidney stones   . Hx of echocardiogram    a. Echo 12/13/12: Mild LVH, EF 99991111, grade 1 diastolic dysfunction, mild LAE, mild RVE, mild RAE  . Hypertension   . Insomnia   . Migraine   . Noncompliance   . OSA (obstructive sleep apnea)    a. Sleep Study 02/2013:  mod OSA, AHI 33 per hour, O2 sat nadir 85%    Patient Active Problem List   Diagnosis Date Noted  . Morbid obesity with body mass index (BMI) of 40.0 to 44.9 in adult [facility]) 09/21/2019  . CHF (congestive heart failure), NYHA class I, acute, diastolic ([place]) A999333  . Central sleep apnea comorbid with prescribed opioid use 09/21/2019  . History of uncontrolled hypertension 09/21/2019  . Complex sleep apnea syndrome 08/18/2019  . Claustrophobia 08/18/2019  . Anxiety associated with depression 08/18/2019  . Hypokalemia 04/04/2017  . Morbid obesity ([place]) 04/04/2017  . Chest wall pain 04/04/2017  . Asthma 02/13/2016  . Non compliance w medication regimen 06/13/2015  . OSA (obstructive sleep apnea) 04/06/2013  . Abnormal EKG 11/08/2012  . Essential hypertension   . HTN (hypertension), malignant 04/29/2012  . Migraine headache with aura 04/29/2012    Past Surgical History:  Procedure Laterality Date  .  Admission  04/2012   Malignant HTN, HA.  CT head negative, cardiology consult.    . CYSTOSCOPY WITH RETROGRADE PYELOGRAM, URETEROSCOPY AND STENT PLACEMENT Left 01/24/2019   Procedure: CYSTOSCOPY WITH RETROGRADE PYELOGRAM, URETEROSCOPY AND STENT PLACEMENT;  Surgeon: Cleon Gustin, MD;  Location: [facility];  Service: Urology;  Laterality: Left;  . HOLMIUM LASER APPLICATION Left [id number]   Procedure: HOLMIUM LASER APPLICATION;  Surgeon: Cleon Gustin, MD;  Location: [facility];  Service: Urology;  Laterality: Left;  Marland Kitchen MANDIBLE SURGERY  1998   metal plate       Family History  Problem Relation Age of Onset  . Cancer Father        prostate, colon- age was in his 53's  . Heart disease Father 54       MI   . Hypertension Father   . Diabetes Mother   . Hypertension Mother   . Hypertension Brother   . Cancer Maternal Grandfather   . Cancer Paternal Grandfather     Social History   Tobacco Use  . Smoking status: Never Smoker  . Smokeless tobacco: Never Used  Substance Use Topics  . Alcohol use: No    Alcohol/week: 0.0 standard drinks  . Drug use: No    Home Medications Prior to Admission medications   Medication Sig Start Date End Date Taking? Authorizing Provider  amLODipine (NORVASC) 10 MG  tablet Take 1 tablet (10 mg total) by mouth daily. 03/22/20   [name], MD  buPROPion (WELLBUTRIN XL) 150 MG 24 hr tablet Take 1 tablet (150 mg total) by mouth daily. 03/22/20   [name], MD  carvedilol (COREG) 6.25 MG tablet Take 1 tablet (6.25 mg total) by mouth 2 (two) times daily. 03/22/20   [name], MD  chlorthalidone (HYGROTON) 25 MG tablet Take 1 tablet (25 mg total) by mouth daily. 03/22/20   [name], MD  lisinopril (ZESTRIL) 40 MG tablet Take 1 tablet (40 mg total) by mouth daily. 03/22/20   [name], MD  potassium chloride (KLOR-CON) 10 MEQ tablet Take 1 tablet (10 mEq total) by mouth daily. TAKE 1  TABLET(10 MEQ) BY MOUTH DAILY 03/22/20   [name], MD  tiZANidine (ZANAFLEX) 4 MG tablet Take 1 tablet (4 mg total) by mouth at bedtime as needed (For hip pain at that time). 03/04/20   Scot Jun, FNP  traMADol (ULTRAM) 50 MG tablet Take 1 tablet (50 mg total) by mouth every 6 (six) hours as needed. 02/26/20   Jaynee Eagles, PA-C  traZODone (DESYREL) 50 MG tablet Take 1 tablet (50 mg total) by mouth at bedtime as needed for sleep. 03/26/20   Lomax, Amy, NP    Allergies    Statins  Review of Systems   Review of Systems Unable to review systems due to altered mental status Physical Exam Updated Vital Signs BP (!) 131/95   Pulse 73   Temp 98.1 F (36.7 C) (Oral)   Resp 14   SpO2 96%   Physical Exam Vitals and nursing note reviewed.  Constitutional:      General: He is not in acute distress.    Appearance: He is well-developed. He is not diaphoretic.     Comments: Patient somnolent, difficult to arouse  HENT:     Head: Normocephalic and atraumatic.  Eyes:     General: No scleral icterus.    Conjunctiva/sclera: Conjunctivae normal.     Comments: Pupils are equal, midsize  Cardiovascular:     Rate and Rhythm: Normal rate and regular rhythm.     Heart sounds: Normal heart sounds.  Pulmonary:     Effort: Pulmonary effort is normal. No respiratory distress.     Breath sounds: Normal breath sounds.  Abdominal:     Palpations: Abdomen is soft.     Tenderness: There is no abdominal tenderness.  Musculoskeletal:     Cervical back: Normal range of motion and neck supple.  Skin:    General: Skin is warm and dry.  Neurological:     Mental Status: He is lethargic.  Psychiatric:        Behavior: Behavior normal.     ED Results / Procedures / Treatments   Labs (all labs ordered are listed, but only abnormal results are displayed) Labs Reviewed  COMPREHENSIVE METABOLIC PANEL - Abnormal; Notable for the following components:      Result Value   Potassium 3.4 (*)     Glucose, Bld 128 (*)    Calcium 8.7 (*)    Albumin 3.3 (*)    All other components within normal limits  CBC WITH DIFFERENTIAL/PLATELET - Abnormal; Notable for the following components:   RDW 16.1 (*)    All other components within normal limits  RAPID URINE DRUG SCREEN, HOSP PERFORMED - Abnormal; Notable for the following components:   Tetrahydrocannabinol POSITIVE (*)    All other  components within normal limits  TSH - Abnormal; Notable for the following components:   TSH 0.241 (*)    All other components within normal limits  CBG MONITORING, ED - Abnormal; Notable for the following components:   Glucose-Capillary 116 (*)    All other components within normal limits  POCT I-STAT 7, (LYTES, BLD GAS, ICA,H+H) - Abnormal; Notable for the following components:   pO2, Arterial 65.0 (*)    Potassium 3.1 (*)    All other components within normal limits  URINE CULTURE  URINALYSIS, COMPLETE (UACMP) WITH MICROSCOPIC  AMMONIA  LACTIC ACID, PLASMA  ETHANOL  MAGNESIUM  I-STAT ARTERIAL BLOOD GAS, ED    EKG EKG Interpretation  Date/Time:  Sunday April 01 2020 12:30:35 EDT Ventricular Rate:  78 PR Interval:    QRS Duration: 133 QT Interval:  581 QTC Calculation: 561 R Axis:   2 Text Interpretation: Sinus rhythm Atrial premature complex Consider right atrial enlargement Probable left ventricular hypertrophy Minimal ST elevation, anterolateral leads Prolonged QT interval , new since last tracing PACS are new compared to last tracing Confirmed by Dorie Rank [id number]) on 04/01/2020 12:49:24 PM   Radiology CT Head Wo Contrast  Result Date: 04/01/2020 CLINICAL DATA:  Generalized weakness and altered mental status. EXAM: CT HEAD WITHOUT CONTRAST TECHNIQUE: Contiguous axial images were obtained from the base of the skull through the vertex without intravenous contrast. COMPARISON:  05/26/2019 FINDINGS: Brain: The ventricles are normal in size and configuration. No extra-axial fluid collections are  identified. The gray-white differentiation is maintained. No CT findings for acute hemispheric infarction or intracranial hemorrhage. No mass lesions. The brainstem and cerebellum are normal. Vascular: No hyperdense vessels or obvious aneurysm. Skull: No acute skull fracture. No bone lesion. Sinuses/Orbits: The paranasal sinuses and mastoid air cells are clear except for a stable mucous retention cyst or polyp in the left maxillary sinus. The globes are intact. Other: No scalp lesions, laceration or hematoma. IMPRESSION: Normal head CT. Electronically Signed   By: Marijo Sanes M.D.   On: 04/01/2020 14:54    Procedures Procedures (including critical care time)  Medications Ordered in ED Medications - No data to display  ED Course  I have reviewed the triage vital signs and the nursing notes.  Pertinent labs & imaging results that were available during my care of the patient were reviewed by me and considered in my medical decision making (see chart for details).  Clinical Course as of Apr 02 2219  Sun Apr 01, 2020  1401 pO2, Arterial(!): 65.0 [AH]  1624 Urine rapid drug screen (hosp performed) [AH]    Clinical Course User Index [AH] Margarita Mail, PA-C   MDM Rules/Calculators/A&P                      Patient here with altered mental status status, somnolent.The differential diagnosis for AMS is extensive and includes, but is not limited to: drug overdose - opioids, alcohol, sedatives, antipsychotics, drug withdrawal, others; Metabolic: hypoxia, hypoglycemia, hyperglycemia, hypercalcemia, hypernatremia, hyponatremia, uremia, hepatic encephalopathy, hypothyroidism, hyperthyroidism, vitamin B12 or thiamine deficiency, carbon monoxide poisoning, Wilson's disease, Lactic acidosis,DKA/HHOS; Infectious: meningitis, encephalitis, bacteremia/sepsis, urinary tract infection, pneumonia, neurosyphilis; Structural: Space-occupying lesion, (brain tumor, subdural hematoma, hydrocephalus,); Vascular:  stroke, subarachnoid hemorrhage, coronary ischemia, hypertensive encephalopathy, CNS vasculitis, thrombotic thrombocytopenic purpura, disseminated intravascular coagulation, hyperviscosity; Psychiatric: Schizophrenia, depression; Other: Seizure, hypothermia, heat stroke, ICU psychosis, dementia -"sundowning." I personally ordered and interpreted labs which include a UDS which is positive for THC,CBG slightly elevated but  of insignificant value.  I-STAT blood gas shows normal pH level, no hypercarbia, PO2 is apparently low at 65 showing isolated hypoxemia.  Ethanol within normal limits, lactic acid within normal limits CBC without any acute abnormalities,.  CMP shows elevated blood glucose of 128, mild hypocalcemia, hypokalemia and albumin levels of insignificant value. Patient's TSH level is low suggestive of hyperthyroidism and UA without evidence of infection. I personally ordered, reviewed images, analyzed and interpreted a head CT which shows no evidence of infarct, space containing lesion or other acute abnormality.  The patient's EKG shows atrial bigeminy with a prolonged QT.  Patient improved and is at his baseline mental status Without intervention.  Was able to reevaluate him.  He tells me that he has not been using his CPAP machine because he has to be connected to Wi-Fi, he is apparently getting ready to move and has the Wi-Fi cut off.  Patient also tells me that he takes all of his medications in the morning.  He thinks maybe that made him sleepy but he does not know what he takes.  I do see trazodone listed in his history and he is unsure if he is still taking it but this could certainly contribute to his somnolence.  I discussed all findings of the case with the patient.  He will need to follow-up with his primary care physician for his long QT and low TSH level. He is advised to use his CPAP as directed as this could be contributing to him having severe somnolence.  I discussed the case with Dr.  Tomi Bamberger and we feel that the patient can be discharged safely at this time.  With close outpatient follow-up, compliance with his CPAP and medication review.  Patient appears reasonably screened for any emergency medical conditions and appropriate for discharge at this time discussed return precautions          Final Clinical Impression(s) / ED Diagnoses Final diagnoses:  Somnolence  Atrial bigeminy  Low TSH level    Rx / DC Orders ED Discharge Orders    None       Margarita Mail, PA-C 04/01/20 2223    Dorie Rank, MD 04/02/20 1336

## 2020-04-01 NOTE — ED Notes (Signed)
Patient is resting comfortably in his room.

## 2020-04-02 LAB — URINE CULTURE: Culture: <10000 — AB

## 2020-04-05 DIAGNOSIS — S7002XA Contusion of left hip, initial encounter: Secondary | ICD-10-CM | POA: Diagnosis not present

## 2020-04-05 DIAGNOSIS — S335XXA Sprain of ligaments of lumbar spine, initial encounter: Secondary | ICD-10-CM | POA: Diagnosis not present

## 2020-04-09 ENCOUNTER — Encounter: Payer: Self-pay | Admitting: Gastroenterology

## 2020-04-11 ENCOUNTER — Ambulatory Visit (INDEPENDENT_AMBULATORY_CARE_PROVIDER_SITE_OTHER): Payer: Medicare Other | Admitting: Family Medicine

## 2020-04-11 ENCOUNTER — Other Ambulatory Visit: Payer: Self-pay

## 2020-04-11 ENCOUNTER — Encounter: Payer: Self-pay | Admitting: Family Medicine

## 2020-04-11 VITALS — BP 160/106 | HR 59 | Temp 98.0°F | Ht 69.0 in | Wt 305.0 lb

## 2020-04-11 DIAGNOSIS — R400 Somnolence: Secondary | ICD-10-CM

## 2020-04-11 DIAGNOSIS — F32A Depression, unspecified: Secondary | ICD-10-CM

## 2020-04-11 DIAGNOSIS — E876 Hypokalemia: Secondary | ICD-10-CM | POA: Diagnosis not present

## 2020-04-11 DIAGNOSIS — I10 Essential (primary) hypertension: Secondary | ICD-10-CM | POA: Diagnosis not present

## 2020-04-11 DIAGNOSIS — R9431 Abnormal electrocardiogram [ECG] [EKG]: Secondary | ICD-10-CM

## 2020-04-11 DIAGNOSIS — F329 Major depressive disorder, single episode, unspecified: Secondary | ICD-10-CM

## 2020-04-11 DIAGNOSIS — R7989 Other specified abnormal findings of blood chemistry: Secondary | ICD-10-CM | POA: Diagnosis not present

## 2020-04-11 NOTE — Progress Notes (Signed)
Subjective:  Patient ID: Michael [name], male    DOB: [birth year]  Age: 49 y.o. MRN: [id number]  CC:  Chief Complaint  Patient presents with  . Follow-up    on hypertension and sleep apnea. pt states he dosn't check his BP at home. pt dosn't express any physical symptoms of hypertension. pt states his Cpap is helping with his sleep apnea. pt would like to go over labs.    HPI Michael [name] presents for   Hypertension: Previous medication nonadherence, improved at March visit back on meds.  Restarted potassium for prior hypokalemia.  Restarted amlodipine 10 mg, chlorthalidone 25 mg, lisinopril 40 mg, carvedilol 6.25 mg twice daily. Home readings: None Takes blood pressure medicines at night if he has to work as he feels sleepy during the day.  Of note he does take tizanidine at bedtime as well as trazodone.  On days he does not work he takes his medications in the morning including today and yesterday.  Denies missed doses. BP Readings from Last 3 Encounters:  04/11/20 (!) 160/106  04/01/20 (!) 131/95  03/22/20 135/85   Lab Results  Component Value Date   CREATININE 1.13 04/01/2020  solved severe OSA with AHI 65, REM AHI 83, supine AHI 90 02 September 2019.  Wears BiPAP.  New mask was fitting better when discussed in March. Wearing BiPAP nightly.  Start at 11 PM, worse throughout the night.  Does take trazodone, not sure if this was for depression or for sleep but he has not had trouble getting to sleep.   Of note he was seen in the emergency room April 4.  Altered mental status, somnolence.  THC positive.  I-STAT blood gas with PO2 65, glucose 128.  Mild hypocalcemia, hypokalemia.  Low TSH suggestive of hypothyroidism.  EKG with atrial bigeminy and prolonged QT.  Apparently reported to the ER provider that he was not using CPAP at that time.  TSH 0.241 on April 4, potassium 3.4  Denies palpitations/chest pain since ER visit.  Referred to cardiology at March 25 visit.  Per referral notes  it appears they were unable to get in contact with him  Depression: Restarted Wellbutrin 150 mg daily with option of 300 mg at last visit if still persistent depression.  Continud counseling.  Feels like the 150 mg dose has been doing well for depression.  Depression screen [facility] 2/9 04/11/2020 03/22/2020 04/02/2017 06/03/2016 03/29/2016  Decreased Interest 0 1 0 2 0  Down, Depressed, Hopeless 0 3 0 2 0  PHQ - 2 Score 0 4 0 4 0  Altered sleeping - 1 - 2 -  Tired, decreased energy - 2 - 2 -  Change in appetite - 2 - 2 -  Feeling bad or failure about yourself  - 2 - 2 -  Trouble concentrating - 1 - 2 -  Moving slowly or fidgety/restless - 0 - 2 -  Suicidal thoughts - 2 - 0 -  PHQ-9 Score - 14 - 16 -  Difficult doing work/chores - - - Very difficult -    History Patient Active Problem List   Diagnosis Date Noted  . Morbid obesity with body mass index (BMI) of 40.0 to 44.9 in adult [facility]) 09/21/2019  . CHF (congestive heart failure), NYHA class I, acute, diastolic ([place]) A999333  . Central sleep apnea comorbid with prescribed opioid use 09/21/2019  . History of uncontrolled hypertension 09/21/2019  . Complex sleep apnea syndrome 08/18/2019  . Claustrophobia 08/18/2019  . Anxiety associated  with depression 08/18/2019  . Hypokalemia 04/04/2017  . Morbid obesity ([place]) 04/04/2017  . Chest wall pain 04/04/2017  . Asthma 02/13/2016  . Non compliance w medication regimen 06/13/2015  . OSA (obstructive sleep apnea) 04/06/2013  . Abnormal EKG 11/08/2012  . Essential hypertension   . HTN (hypertension), malignant 04/29/2012  . Migraine headache with aura 04/29/2012   Past Medical History:  Diagnosis Date  . Anxiety   . Depression    Questionable per records. Not on any medication.   Marland Kitchen History of kidney stones   . Hx of echocardiogram    a. Echo 12/13/12: Mild LVH, EF 99991111, grade 1 diastolic dysfunction, mild LAE, mild RVE, mild RAE  . Hypertension   . Insomnia   . Migraine   .  Noncompliance   . OSA (obstructive sleep apnea)    a. Sleep Study 02/2013:  mod OSA, AHI 33 per hour, O2 sat nadir 85%   Past Surgical History:  Procedure Laterality Date  . Admission  04/2012   Malignant HTN, HA.  CT head negative, cardiology consult.    . CYSTOSCOPY WITH RETROGRADE PYELOGRAM, URETEROSCOPY AND STENT PLACEMENT Left 01/24/2019   Procedure: CYSTOSCOPY WITH RETROGRADE PYELOGRAM, URETEROSCOPY AND STENT PLACEMENT;  Surgeon: Cleon Gustin, MD;  Location[facility];  Service: Urology;  Laterality: Left;  . HOLMIUM LASER APPLICATION Left [id number]   Procedure: HOLMIUM LASER APPLICATION;  Surgeon: Cleon Gustin, MD;  Location: [facility];  Service: Urology;  Laterality: Left;  Marland Kitchen MANDIBLE SURGERY  1998   metal plate   Allergies  Allergen Reactions  . Statins Other (See Comments)    unknown   Prior to Admission medications   Medication Sig Start Date End Date Taking? Authorizing Provider  amLODipine (NORVASC) 10 MG tablet Take 1 tablet (10 mg total) by mouth daily. 03/22/20  Yes [name], MD  buPROPion (WELLBUTRIN XL) 150 MG 24 hr tablet Take 1 tablet (150 mg total) by mouth daily. 03/22/20  Yes [name], MD  carvedilol (COREG) 6.25 MG tablet Take 1 tablet (6.25 mg total) by mouth 2 (two) times daily. 03/22/20  Yes [name], MD  chlorthalidone (HYGROTON) 25 MG tablet Take 1 tablet (25 mg total) by mouth daily. 03/22/20  Yes [name], MD  lisinopril (ZESTRIL) 40 MG tablet Take 1 tablet (40 mg total) by mouth daily. 03/22/20  Yes [name], MD  potassium chloride (KLOR-CON) 10 MEQ tablet Take 1 tablet (10 mEq total) by mouth daily. TAKE 1 TABLET(10 MEQ) BY MOUTH DAILY 03/22/20  Yes [name], MD  tiZANidine (ZANAFLEX) 4 MG tablet Take 1 tablet (4 mg total) by mouth at bedtime as needed (For hip pain at that time). 03/04/20  Yes Scot Jun, FNP  traMADol (ULTRAM) 50 MG tablet Take 1 tablet  (50 mg total) by mouth every 6 (six) hours as needed. 02/26/20  Yes Jaynee Eagles, PA-C  traZODone (DESYREL) 50 MG tablet Take 1 tablet (50 mg total) by mouth at bedtime as needed for sleep. 03/26/20  Yes Lomax, Amy, NP   Social History   Socioeconomic History  . Marital status: Divorced    Spouse name: Not on file  . Number of children: 4  . Years of education: Not on file  . Highest education level: Not on file  Occupational History  . Occupation: Unemployed//disabled  Tobacco Use  . Smoking status: Never Smoker  . Smokeless tobacco: Never Used  Substance and  Sexual Activity  . Alcohol use: No    Alcohol/week: 0.0 standard drinks  . Drug use: No  . Sexual activity: Yes    Comment: per pt's blue health form - 1 sex partner in last 12 months  Other Topics Concern  . Not on file  Social History Narrative   Marital status: single; living with fiance      Children: 4 children, no grandchildren.      Employment:  Disability for learning disabilities since 2012.   Right-handed   Caffeine: occasional         Social Determinants of Radio broadcast assistant Strain:   . Difficulty of Paying Living Expenses:   Food Insecurity:   . Worried About Charity fundraiser in the Last Year:   . Arboriculturist in the Last Year:   Transportation Needs:   . Film/video editor (Medical):   Marland Kitchen Lack of Transportation (Non-Medical):   Physical Activity:   . Days of Exercise per Week:   . Minutes of Exercise per Session:   Stress:   . Feeling of Stress :   Social Connections:   . Frequency of Communication with Friends and Family:   . Frequency of Social Gatherings with Friends and Family:   . Attends Religious Services:   . Active Member of Clubs or Organizations:   . Attends Archivist Meetings:   Marland Kitchen Marital Status:   Intimate Partner Violence:   . Fear of Current or Ex-Partner:   . Emotionally Abused:   Marland Kitchen Physically Abused:   . Sexually Abused:     Review of  Systems   Objective:   Vitals:   04/11/20 1509 04/11/20 1516  BP: (!) 169/117 (!) 160/106  Pulse: (!) 59   Temp: 98 F (36.7 C)   TempSrc: Temporal   SpO2: 97%   Weight: (!) 305 lb (138.3 kg)   Height: 5\' 9"  (1.753 m)      Physical Exam Vitals reviewed.  Constitutional:      Appearance: He is well-developed.  HENT:     Head: Normocephalic and atraumatic.  Eyes:     Pupils: Pupils are equal, round, and reactive to light.  Neck:     Vascular: No carotid bruit or JVD.  Cardiovascular:     Rate and Rhythm: Normal rate and regular rhythm.     Heart sounds: Normal heart sounds. No murmur.  Pulmonary:     Effort: Pulmonary effort is normal.     Breath sounds: Normal breath sounds. No rales.  Skin:    General: Skin is warm and dry.  Neurological:     Mental Status: He is alert and oriented to person, place, and time.       EKG, sinus rhythm, QTC 425, no apparent acute findings. Assessment & Plan:  Michael [name] is a 49 y.o. male . Prolonged QT interval - Plan: EKG 12-Lead  -Not seen on repeat EKG in office.  Denies illicit drug use besides marijuana.  Denies palpitations/chest pains.  Cardiology follow-up planned, will make sure we have correct number, and try to coordinate that visit.  Somnolence  -Possibly multifactorial with obstructive sleep apnea, use of trazodone, muscle relaxants.  Reports compliance with CPAP.  Stop trazodone for now.  Essential hypertension - Plan: Basic metabolic panel  -Uncontrolled.  Question compliance with variation from daytime versus evening dosing of medications depending on workday.  However does report compliance past 2 days with medication in the morning.  Increase chlorthalidone to 50 mg for now with recheck in 1 week.  ER precautions.  Hypokalemia - Plan: Basic metabolic panel  -Currently on supplementation, repeat testing  Low TSH level - Plan: TSH + free T4  -Noted in ER, repeat testing.  Depression, unspecified depression  type  -Improved/stable with Wellbutrin 150 mg daily.  Continue same for now.  Monitor for changes off of trazodone as above.  No orders of the defined types were placed in this encounter.  Patient Instructions     Stop trazodone for now to see if that lessens sedation during the day.  Make sure to take your blood pressure medicine at the same time every day.  Increase chlorthalidone to 2 pills or a total of 50 mg/day for right now and recheck with me in 1 week.  Bring a copy of your home blood pressure readings at that time.  We will recheck thyroid test that was low in the emergency room.  EKG looks better today but still would like you to follow-up with cardiology as planned.   If you have lab work done today you will be contacted with your lab results within the next 2 weeks.  If you have not heard from [place] then please contact us. The fastest way to get your results is to register for My Chart.   IF you received an x-ray today, you will receive an invoice from [facility]. Please contact [facility] at [phone] with questions or concerns regarding your invoice.   IF you received labwork today, you will receive an invoice from [facility]. Please contact [facility] at [phone] with questions or concerns regarding your invoice.   Our billing staff will not be able to assist you with questions regarding bills from these companies.  You will be contacted with the lab results as soon as they are available. The fastest way to get your results is to activate your My Chart account. Instructions are located on the last page of this paperwork. If you have not heard from [place] regarding the results in 2 weeks, please contact this office.         Signed, [name], MD [facility]

## 2020-04-11 NOTE — Patient Instructions (Addendum)
   Stop trazodone for now to see if that lessens sedation during the day.  Make sure to take your blood pressure medicine at the same time every day.  Increase chlorthalidone to 2 pills or a total of 50 mg/day for right now and recheck with me in 1 week.  Bring a copy of your home blood pressure readings at that time.  We will recheck thyroid test that was low in the emergency room.  EKG looks better today but still would like you to follow-up with cardiology as planned.   If you have lab work done today you will be contacted with your lab results within the next 2 weeks.  If you have not heard from [place] then please contact us. The fastest way to get your results is to register for My Chart.   IF you received an x-ray today, you will receive an invoice from [facility]. Please contact [facility] at [phone] with questions or concerns regarding your invoice.   IF you received labwork today, you will receive an invoice from [place]. Please contact [facility] at [phone] with questions or concerns regarding your invoice.   Our billing staff will not be able to assist you with questions regarding bills from these companies.  You will be contacted with the lab results as soon as they are available. The fastest way to get your results is to activate your My Chart account. Instructions are located on the last page of this paperwork. If you have not heard from [place] regarding the results in 2 weeks, please contact this office.

## 2020-04-12 LAB — BASIC METABOLIC PANEL
BUN/Creatinine Ratio: 18 (ref 9–20)
BUN: 16 mg/dL (ref 6–24)
CO2: 23 mmol/L (ref 20–29)
Calcium: 8.7 mg/dL (ref 8.7–10.2)
Chloride: 103 mmol/L (ref 96–106)
Creatinine, Ser: 0.88 mg/dL (ref 0.76–1.27)
GFR calc Af Amer: 116 mL/min/{1.73_m2} (ref >59)
GFR calc non Af Amer: 100 mL/min/{1.73_m2} (ref >59)
Glucose: 92 mg/dL (ref 65–99)
Potassium: 3.2 mmol/L — ABNORMAL LOW (ref 3.5–5.2)
Sodium: 142 mmol/L (ref 134–144)

## 2020-04-12 LAB — TSH+FREE T4
Free T4: 1.28 ng/dL (ref 0.82–1.77)
TSH: 1.1 u[IU]/mL (ref 0.450–4.500)

## 2020-04-17 ENCOUNTER — Ambulatory Visit: Payer: Self-pay | Admitting: Family Medicine

## 2020-04-18 ENCOUNTER — Other Ambulatory Visit: Payer: Self-pay

## 2020-04-18 ENCOUNTER — Encounter: Payer: Self-pay | Admitting: Family Medicine

## 2020-04-18 ENCOUNTER — Ambulatory Visit (INDEPENDENT_AMBULATORY_CARE_PROVIDER_SITE_OTHER): Payer: Medicare Other | Admitting: Family Medicine

## 2020-04-18 VITALS — BP 130/80 | HR 89 | Temp 97.2°F | Ht 69.0 in | Wt 298.0 lb

## 2020-04-18 DIAGNOSIS — I10 Essential (primary) hypertension: Secondary | ICD-10-CM | POA: Diagnosis not present

## 2020-04-18 DIAGNOSIS — Z87898 Personal history of other specified conditions: Secondary | ICD-10-CM

## 2020-04-18 DIAGNOSIS — G4733 Obstructive sleep apnea (adult) (pediatric): Secondary | ICD-10-CM

## 2020-04-18 DIAGNOSIS — R400 Somnolence: Secondary | ICD-10-CM | POA: Diagnosis not present

## 2020-04-18 DIAGNOSIS — E876 Hypokalemia: Secondary | ICD-10-CM

## 2020-04-18 NOTE — Patient Instructions (Addendum)
Increase the potassium to 2 pills per day. Total dose of 100meq per day. Recheck levels in 5 days. I will check labs today.   You have an appointment with cardiology tomorrow at 65 am, Dr. Radford Pax. Here is their number if needed:[phone].   I do recommend stopping trazodone for now to see if that helps sleepiness. Follow up with sleep specialist.   Return to the clinic or go to the nearest emergency room if any of your symptoms worsen or new symptoms occur (including any heart palpitations, or acute worsening of fatigue/sleepiness)      If you have lab work done today you will be contacted with your lab results within the next 2 weeks.  If you have not heard from [place] then please contact us. The fastest way to get your results is to register for My Chart.   IF you received an x-ray today, you will receive an invoice from [facility]. Please contact [facility] at [phone] with questions or concerns regarding your invoice.   IF you received labwork today, you will receive an invoice from [place]. Please contact [facility] at [phone] with questions or concerns regarding your invoice.   Our billing staff will not be able to assist you with questions regarding bills from these companies.  You will be contacted with the lab results as soon as they are available. The fastest way to get your results is to activate your My Chart account. Instructions are located on the last page of this paperwork. If you have not heard from [place] regarding the results in 2 weeks, please contact this office.        If you have lab work done today you will be contacted with your lab results within the next 2 weeks.  If you have not heard from [place] then please contact us. The fastest way to get your results is to register for My Chart.   IF you received an x-ray today, you will receive an invoice from [facility]. Please contact [facility] at [phone] with questions or  concerns regarding your invoice.   IF you received labwork today, you will receive an invoice from [place]. Please contact [facility] at [phone] with questions or concerns regarding your invoice.   Our billing staff will not be able to assist you with questions regarding bills from these companies.  You will be contacted with the lab results as soon as they are available. The fastest way to get your results is to activate your My Chart account. Instructions are located on the last page of this paperwork. If you have not heard from [place] regarding the results in 2 weeks, please contact this office.

## 2020-04-18 NOTE — Progress Notes (Signed)
Subjective:  Patient ID: Michael [name], male    DOB: 1971/07/15  Age: 49 y.o. MRN: [id number]  CC:  Chief Complaint  Patient presents with  . Follow-up    hypertension and depression. pt states he hasn't check his BP due to moving and un abl;e to located BP machine. pt dosn't report any physical symptoms of hypertension. pt states his depression medication is working well. pt reports no issues with any of his currentmedication.    HPI Michael [name] presents for   Hypertension: Follow-up from April 14.  Uncontrolled at that time, questionable compliance on variation with daytime versus evening dose of medications.  Chlorthalidone was increased to 50 mg with consistent dosing of meds discussed. Has house calls with insurance planned.  Not checking home readings.  Has pill pack.  Did not increase chlorthalidone. Still on 25mg  qd.  Still taking meds on different times (8 am or 11am depending if he works).  Wearing Bipap nightly.   BP Readings from Last 3 Encounters:  04/18/20 130/80  04/11/20 (!) 160/106  04/01/20 (!) 131/95   Lab Results  Component Value Date   CREATININE 0.88 04/11/2020   Hypokalemia Normal magnesium of 1.9 on April 4, potassium 3.4 at that time.  Recent testing with lower potassium of 3.2 last week.  Previous med list indicated a potassium 10 mEq daily.still on once per day.  No new mm cramps. Occasional leg cramps - not new. No palpitations or chest pains.  Has appt with cardiology tomorrow.   Somnolence: Recommended he discontinue trazodone - has continued as meds are in pill pack.  Using Bipap above. Still some somnolence.   History Patient Active Problem List   Diagnosis Date Noted  . Morbid obesity with body mass index (BMI) of 40.0 to 44.9 in adult [facility]) 09/21/2019  . CHF (congestive heart failure), NYHA class I, acute, diastolic ([place]) [id number]  . Central sleep apnea comorbid with prescribed opioid use 09/21/2019  . History of uncontrolled  hypertension 09/21/2019  . Complex sleep apnea syndrome 08/18/2019  . Claustrophobia 08/18/2019  . Anxiety associated with depression 08/18/2019  . Hypokalemia 04/04/2017  . Morbid obesity ([place]) 04/04/2017  . Chest wall pain 04/04/2017  . Asthma 02/13/2016  . Non compliance w medication regimen 06/13/2015  . OSA (obstructive sleep apnea) 04/06/2013  . Abnormal EKG 11/08/2012  . Essential hypertension   . HTN (hypertension), malignant 04/29/2012  . Migraine headache with aura 04/29/2012   Past Medical History:  Diagnosis Date  . Anxiety   . Depression    Questionable per records. Not on any medication.   Marland Kitchen History of kidney stones   . Hx of echocardiogram    a. Echo 12/13/12: Mild LVH, EF 99991111, grade 1 diastolic dysfunction, mild LAE, mild RVE, mild RAE  . Hypertension   . Insomnia   . Migraine   . Noncompliance   . OSA (obstructive sleep apnea)    a. Sleep Study 02/2013:  mod OSA, AHI 33 per hour, O2 sat nadir 85%   Past Surgical History:  Procedure Laterality Date  . Admission  04/2012   Malignant HTN, HA.  CT head negative, cardiology consult.    . CYSTOSCOPY WITH RETROGRADE PYELOGRAM, URETEROSCOPY AND STENT PLACEMENT Left 01/24/2019   Procedure: CYSTOSCOPY WITH RETROGRADE PYELOGRAM, URETEROSCOPY AND STENT PLACEMENT;  Surgeon: Cleon Gustin, MD;  Location: [facility];  Service: Urology;  Laterality: Left;  . HOLMIUM LASER APPLICATION Left [id number]   Procedure: HOLMIUM LASER APPLICATION;  Surgeon:  McKenzie, Candee Furbish, MD;  Location: [facility];  Service: Urology;  Laterality: Left;  Marland Kitchen MANDIBLE SURGERY  1998   metal plate   Allergies  Allergen Reactions  . Statins Other (See Comments)    unknown   Prior to Admission medications   Medication Sig Start Date End Date Taking? Authorizing Provider  amLODipine (NORVASC) 10 MG tablet Take 1 tablet (10 mg total) by mouth daily. 03/22/20  Yes [name], MD  buPROPion (WELLBUTRIN XL)  150 MG 24 hr tablet Take 1 tablet (150 mg total) by mouth daily. 03/22/20  Yes [name], MD  carvedilol (COREG) 6.25 MG tablet Take 1 tablet (6.25 mg total) by mouth 2 (two) times daily. 03/22/20  Yes [name], MD  chlorthalidone (HYGROTON) 25 MG tablet Take 1 tablet (25 mg total) by mouth daily. 03/22/20  Yes [name], MD  lisinopril (ZESTRIL) 40 MG tablet Take 1 tablet (40 mg total) by mouth daily. 03/22/20  Yes [name], MD  potassium chloride (KLOR-CON) 10 MEQ tablet Take 1 tablet (10 mEq total) by mouth daily. TAKE 1 TABLET(10 MEQ) BY MOUTH DAILY 03/22/20  Yes [name], MD  predniSONE (DELTASONE) 20 MG tablet  04/05/20  Yes [provider]  predniSONE (STERAPRED UNI-PAK 21 TAB) 10 MG (21) TBPK tablet Take 10 mg by mouth as directed. 04/05/20  Yes [provider]  tiZANidine (ZANAFLEX) 4 MG tablet Take 1 tablet (4 mg total) by mouth at bedtime as needed (For hip pain at that time). 03/04/20  Yes Scot Jun, FNP  traMADol (ULTRAM) 50 MG tablet Take 1 tablet (50 mg total) by mouth every 6 (six) hours as needed. 02/26/20  Yes Jaynee Eagles, PA-C  traZODone (DESYREL) 50 MG tablet Take 1 tablet (50 mg total) by mouth at bedtime as needed for sleep. 03/26/20  Yes Lomax, Amy, NP   Social History   Socioeconomic History  . Marital status: Divorced    Spouse name: Not on file  . Number of children: 4  . Years of education: Not on file  . Highest education level: Not on file  Occupational History  . Occupation: Unemployed//disabled  Tobacco Use  . Smoking status: Never Smoker  . Smokeless tobacco: Never Used  Substance and Sexual Activity  . Alcohol use: No    Alcohol/week: 0.0 standard drinks  . Drug use: No  . Sexual activity: Yes    Comment: per pt's blue health form - 1 sex partner in last 12 months  Other Topics Concern  . Not on file  Social History Narrative   Marital status: single; living with fiance      Children: 4  children, no grandchildren.      Employment:  Disability for learning disabilities since 2012.   Right-handed   Caffeine: occasional         Social Determinants of Radio broadcast assistant Strain:   . Difficulty of Paying Living Expenses:   Food Insecurity:   . Worried About Charity fundraiser in the Last Year:   . Arboriculturist in the Last Year:   Transportation Needs:   . Film/video editor (Medical):   Marland Kitchen Lack of Transportation (Non-Medical):   Physical Activity:   . Days of Exercise per Week:   . Minutes of Exercise per Session:   Stress:   . Feeling of Stress :   Social Connections:   . Frequency of Communication with Friends  and Family:   . Frequency of Social Gatherings with Friends and Family:   . Attends Religious Services:   . Active Member of Clubs or Organizations:   . Attends Archivist Meetings:   Marland Kitchen Marital Status:   Intimate Partner Violence:   . Fear of Current or Ex-Partner:   . Emotionally Abused:   Marland Kitchen Physically Abused:   . Sexually Abused:     Review of Systems  Constitutional: Positive for fatigue. Negative for unexpected weight change.  Eyes: Negative for visual disturbance.  Respiratory: Negative for cough, chest tightness and shortness of breath.   Cardiovascular: Negative for chest pain and palpitations.  Gastrointestinal: Negative for abdominal pain and blood in stool.  Neurological: Negative for dizziness, light-headedness and headaches.  All other systems reviewed and are negative.    Objective:   Vitals:   04/18/20 1351  BP: 130/80  Pulse: 89  Temp: (!) 97.2 F (36.2 C)  TempSrc: Temporal  SpO2: 96%  Weight: 298 lb (135.2 kg)  Height: 5\' 9"  (1.753 m)     Physical Exam Vitals reviewed.  Constitutional:      Appearance: He is well-developed.  HENT:     Head: Normocephalic and atraumatic.  Eyes:     Pupils: Pupils are equal, round, and reactive to light.  Neck:     Vascular: No carotid bruit or JVD.    Cardiovascular:     Rate and Rhythm: Normal rate.     Heart sounds: Normal heart sounds. No murmur.     Comments: Few ectopic beats, regular rate.  Pulmonary:     Effort: Pulmonary effort is normal.     Breath sounds: Normal breath sounds. No rales.  Skin:    General: Skin is warm and dry.  Neurological:     Mental Status: He is alert and oriented to person, place, and time.    EKG sinus rhythm with some rate variation.  Flat T waves in inferior leads, small T waves anterior lateral.  QTc 480.  Assessment & Plan:  Michael [name] is a 50 y.o. male . Hypokalemia - Plan: EKG 12-Lead, Magnesium Somnolence - Plan: EKG 12-Lead History of prolonged Q-T interval on ECG - Plan: Magnesium  Slightly flattened T waves but no arrhythmia, QTc appears okay.  Increase potassium to 20 mEq daily for now, check levels today with magnesium, then again in 5 days.  ER precautions of palpitations or increased somnolence.  Essential hypertension - Plan: Basic metabolic panel  -Improved control.  Consistent dosing recommended, stay at same dose of chlorthalidone 25 mg for now.   OSA treated with BiPAP  -Continue BiPAP, follow-up with sleep specialist.  Still recommend trial off trazodone to see if that helps somnolence as he may be taking that during the day.   No orders of the defined types were placed in this encounter.  Patient Instructions   Increase the potassium to 2 pills per day. Total dose of 63meq per day. Recheck levels in 5 days. I will check labs today.   You have an appointment with cardiology tomorrow at 75 am, Dr. Radford Pax. Here is their number if needed:[phone].   I do recommend stopping trazodone for now to see if that helps sleepiness. Follow up with sleep specialist.   Return to the clinic or go to the nearest emergency room if any of your symptoms worsen or new symptoms occur (including any heart palpitations, or acute worsening of fatigue/sleepiness)      If you have lab  work done today you will be contacted with your lab results within the next 2 weeks.  If you have not heard from [place] then please contact us. The fastest way to get your results is to register for My Chart.   IF you received an x-ray today, you will receive an invoice from [facility]. Please contact [facility] at [phone] with questions or concerns regarding your invoice.   IF you received labwork today, you will receive an invoice from [place]. Please contact [facility] at [phone] with questions or concerns regarding your invoice.   Our billing staff will not be able to assist you with questions regarding bills from these companies.  You will be contacted with the lab results as soon as they are available. The fastest way to get your results is to activate your My Chart account. Instructions are located on the last page of this paperwork. If you have not heard from [place] regarding the results in 2 weeks, please contact this office.        If you have lab work done today you will be contacted with your lab results within the next 2 weeks.  If you have not heard from [place] then please contact us. The fastest way to get your results is to register for My Chart.   IF you received an x-ray today, you will receive an invoice from [facility]. Please contact [facility] at [phone] with questions or concerns regarding your invoice.   IF you received labwork today, you will receive an invoice from [place]. Please contact [facility] at [phone] with questions or concerns regarding your invoice.   Our billing staff will not be able to assist you with questions regarding bills from these companies.  You will be contacted with the lab results as soon as they are available. The fastest way to get your results is to activate your My Chart account. Instructions are located on the last page of this paperwork. If you have not heard from [place] regarding the results  in 2 weeks, please contact this office.         Signed, [name], MD [facility]

## 2020-04-19 ENCOUNTER — Other Ambulatory Visit: Payer: Self-pay | Admitting: Family Medicine

## 2020-04-19 ENCOUNTER — Ambulatory Visit: Payer: Medicare Other | Admitting: Cardiology

## 2020-04-19 DIAGNOSIS — I10 Essential (primary) hypertension: Secondary | ICD-10-CM

## 2020-04-19 LAB — BASIC METABOLIC PANEL
BUN/Creatinine Ratio: 14 (ref 9–20)
BUN: 17 mg/dL (ref 6–24)
CO2: 25 mmol/L (ref 20–29)
Calcium: 9.3 mg/dL (ref 8.7–10.2)
Chloride: 103 mmol/L (ref 96–106)
Creatinine, Ser: 1.19 mg/dL (ref 0.76–1.27)
GFR calc Af Amer: 82 mL/min/{1.73_m2} (ref >59)
GFR calc non Af Amer: 71 mL/min/{1.73_m2} (ref >59)
Glucose: 91 mg/dL (ref 65–99)
Potassium: 3.8 mmol/L (ref 3.5–5.2)
Sodium: 144 mmol/L (ref 134–144)

## 2020-04-19 LAB — MAGNESIUM: Magnesium: 2.2 mg/dL (ref 1.6–2.3)

## 2020-04-19 NOTE — Progress Notes (Deleted)
Cardiology Office Note:    Date:  04/19/2020   ID:  Michael [name], DOB 1971-04-22, MRN [id number]  PCP:  [name], MD  Cardiologist:  Fransico Him, MD    Referring MD: [name], MD   No chief complaint on file.   History of Present Illness:    Michael [name] is a 49 y.o. male with a hx of ***  Past Medical History:  Diagnosis Date  . Anxiety   . Depression    Questionable per records. Not on any medication.   Marland Kitchen History of kidney stones   . Hx of echocardiogram    a. Echo 12/13/12: Mild LVH, EF 99991111, grade 1 diastolic dysfunction, mild LAE, mild RVE, mild RAE  . Hypertension   . Insomnia   . Migraine   . Noncompliance   . OSA (obstructive sleep apnea)    a. Sleep Study 02/2013:  mod OSA, AHI 33 per hour, O2 sat nadir 85%    Past Surgical History:  Procedure Laterality Date  . Admission  04/2012   Malignant HTN, HA.  CT head negative, cardiology consult.    . CYSTOSCOPY WITH RETROGRADE PYELOGRAM, URETEROSCOPY AND STENT PLACEMENT Left 01/24/2019   Procedure: CYSTOSCOPY WITH RETROGRADE PYELOGRAM, URETEROSCOPY AND STENT PLACEMENT;  Surgeon: Cleon Gustin, MD;  Location: [facility];  Service: Urology;  Laterality: Left;  . HOLMIUM LASER APPLICATION Left [id number]   Procedure: HOLMIUM LASER APPLICATION;  Surgeon: Cleon Gustin, MD;  Location: [facility];  Service: Urology;  Laterality: Left;  Marland Kitchen MANDIBLE SURGERY  1998   metal plate    Current Medications: No outpatient medications have been marked as taking for the 04/19/20 encounter (Appointment) with Sueanne Margarita, MD.     Allergies:   Statins   Social History   Socioeconomic History  . Marital status: Divorced    Spouse name: Not on file  . Number of children: 4  . Years of education: Not on file  . Highest education level: Not on file  Occupational History  . Occupation: Unemployed//disabled  Tobacco Use  . Smoking status: Never Smoker  . Smokeless  tobacco: Never Used  Substance and Sexual Activity  . Alcohol use: No    Alcohol/week: 0.0 standard drinks  . Drug use: No  . Sexual activity: Yes    Comment: per pt's blue health form - 1 sex partner in last 12 months  Other Topics Concern  . Not on file  Social History Narrative   Marital status: single; living with fiance      Children: 4 children, no grandchildren.      Employment:  Disability for learning disabilities since 2012.   Right-handed   Caffeine: occasional         Social Determinants of Radio broadcast assistant Strain:   . Difficulty of Paying Living Expenses:   Food Insecurity:   . Worried About Charity fundraiser in the Last Year:   . Arboriculturist in the Last Year:   Transportation Needs:   . Film/video editor (Medical):   Marland Kitchen Lack of Transportation (Non-Medical):   Physical Activity:   . Days of Exercise per Week:   . Minutes of Exercise per Session:   Stress:   . Feeling of Stress :   Social Connections:   . Frequency of Communication with Friends and Family:   . Frequency of Social Gatherings with Friends and Family:   . Attends Religious Services:   .  Active Member of Clubs or Organizations:   . Attends Archivist Meetings:   Marland Kitchen Marital Status:      Family History: The patient's ***family history includes Cancer in his father, maternal grandfather, and paternal grandfather; Diabetes in his mother; Heart disease (age of onset: 18) in his father; Hypertension in his brother, father, and mother.  ROS:   Please see the history of present illness.    ROS  All other systems reviewed and negative.   EKGs/Labs/Other Studies Reviewed:    The following studies were reviewed today: ***  EKG:  EKG is *** ordered today.  The ekg ordered today demonstrates ***  Recent Labs: 04/01/2020: ALT 16; Hemoglobin 13.3; Platelets 229 04/11/2020: TSH 1.100 04/18/2020: BUN 17; Creatinine, Ser 1.19; Magnesium 2.2; Potassium 3.8; Sodium 144    Recent Lipid Panel    Component Value Date/Time   CHOL 159 03/22/2020 1550   TRIG 79 03/22/2020 1550   HDL 44 03/22/2020 1550   CHOLHDL 3.6 03/22/2020 1550   CHOLHDL 3.2 06/13/2015 1118   VLDL 8 06/13/2015 1118   LDLCALC 100 (H) 03/22/2020 1550    Physical Exam:    VS:  There were no vitals taken for this visit.    Wt Readings from Last 3 Encounters:  04/18/20 298 lb (135.2 kg)  04/11/20 (!) 305 lb (138.3 kg)  03/22/20 297 lb (134.7 kg)     GEN: *** Well nourished, well developed in no acute distress HEENT: Normal NECK: No JVD; No carotid bruits LYMPHATICS: No lymphadenopathy CARDIAC: ***RRR, no murmurs, rubs, gallops RESPIRATORY:  Clear to auscultation without rales, wheezing or rhonchi  ABDOMEN: Soft, non-tender, non-distended MUSCULOSKELETAL:  No edema; No deformity  SKIN: Warm and dry NEUROLOGIC:  Alert and oriented x 3 PSYCHIATRIC:  Normal affect   ASSESSMENT:    No diagnosis found. PLAN:    In order of problems listed above:  ***   Medication Adjustments/Labs and Tests Ordered: Current medicines are reviewed at length with the patient today.  Concerns regarding medicines are outlined above.  No orders of the defined types were placed in this encounter.  No orders of the defined types were placed in this encounter.   Signed, Fransico Him, MD  04/19/2020 8:15 AM    [place]

## 2020-05-03 ENCOUNTER — Other Ambulatory Visit: Payer: Self-pay | Admitting: Family Medicine

## 2020-05-03 DIAGNOSIS — F329 Major depressive disorder, single episode, unspecified: Secondary | ICD-10-CM

## 2020-05-03 DIAGNOSIS — F32A Depression, unspecified: Secondary | ICD-10-CM

## 2020-05-07 ENCOUNTER — Encounter: Payer: Medicare Other | Admitting: Gastroenterology

## 2020-05-09 ENCOUNTER — Ambulatory Visit: Payer: Medicare Other | Admitting: Family Medicine

## 2020-05-10 ENCOUNTER — Ambulatory Visit: Payer: Self-pay | Admitting: Family Medicine

## 2020-05-10 ENCOUNTER — Telehealth: Payer: Self-pay | Admitting: Family Medicine

## 2020-05-10 NOTE — Telephone Encounter (Signed)
Pharmacy called to verify bupropion xl prescription CB # [id number]   Please advise

## 2020-05-11 ENCOUNTER — Other Ambulatory Visit: Payer: Self-pay

## 2020-05-11 DIAGNOSIS — E876 Hypokalemia: Secondary | ICD-10-CM

## 2020-05-11 DIAGNOSIS — I10 Essential (primary) hypertension: Secondary | ICD-10-CM

## 2020-05-11 NOTE — Telephone Encounter (Signed)
Called pharmacy, they do not have record of call regarding pt wellbutrin medication, called pt to follow up with med, lm

## 2020-05-14 ENCOUNTER — Telehealth: Payer: Self-pay | Admitting: Family Medicine

## 2020-05-14 NOTE — Telephone Encounter (Signed)
Pharmacy called and is wanting a call back from providers or providers nurse regarding the proscription sent in for   buPROPion (WELLBUTRIN XL) 150 MG 24 hr tablet [id number]  [id number] reference number: [id number] Please advise.

## 2020-05-15 NOTE — Telephone Encounter (Signed)
Spoke with pharmacy and they needed clarification on the buPROOion. Looks like the pt was on 300mg  and now it looks like it has been dropped to 150mg  now. Is this correct?  Please Advise.

## 2020-05-15 NOTE — Telephone Encounter (Signed)
Yes.  Please see plan on April 14 visit that discusses lower dose.  Thank you

## 2020-05-18 ENCOUNTER — Telehealth: Payer: Self-pay | Admitting: Family Medicine

## 2020-05-18 ENCOUNTER — Other Ambulatory Visit (HOSPITAL_COMMUNITY)
Admission: RE | Admit: 2020-05-18 | Discharge: 2020-05-18 | Disposition: A | Discharge status: Home / Self Care | Payer: Medicare Other | Source: Ambulatory Visit | Attending: Family Medicine | Admitting: Family Medicine

## 2020-05-18 ENCOUNTER — Encounter: Payer: Self-pay | Admitting: Family Medicine

## 2020-05-18 ENCOUNTER — Other Ambulatory Visit: Payer: Self-pay

## 2020-05-18 ENCOUNTER — Ambulatory Visit (INDEPENDENT_AMBULATORY_CARE_PROVIDER_SITE_OTHER): Payer: Medicare Other | Admitting: Family Medicine

## 2020-05-18 VITALS — BP 139/95 | HR 76 | Temp 98.2°F | Ht 69.0 in | Wt 299.2 lb

## 2020-05-18 DIAGNOSIS — G4733 Obstructive sleep apnea (adult) (pediatric): Secondary | ICD-10-CM | POA: Diagnosis not present

## 2020-05-18 DIAGNOSIS — Z202 Contact with and (suspected) exposure to infections with a predominantly sexual mode of transmission: Secondary | ICD-10-CM | POA: Diagnosis present

## 2020-05-18 DIAGNOSIS — E876 Hypokalemia: Secondary | ICD-10-CM

## 2020-05-18 DIAGNOSIS — N4889 Other specified disorders of penis: Secondary | ICD-10-CM

## 2020-05-18 DIAGNOSIS — F329 Major depressive disorder, single episode, unspecified: Secondary | ICD-10-CM | POA: Diagnosis not present

## 2020-05-18 DIAGNOSIS — I499 Cardiac arrhythmia, unspecified: Secondary | ICD-10-CM | POA: Diagnosis not present

## 2020-05-18 DIAGNOSIS — I10 Essential (primary) hypertension: Secondary | ICD-10-CM | POA: Diagnosis not present

## 2020-05-18 DIAGNOSIS — F32A Depression, unspecified: Secondary | ICD-10-CM

## 2020-05-18 MED ORDER — CHLORTHALIDONE 50 MG PO TABS: 50.0000 mg | ORAL_TABLET | Freq: Every day | ORAL | 1 refills | Status: DC

## 2020-05-18 NOTE — Patient Instructions (Addendum)
  Blood pressure still running a little bit too high.  Increase chlorthalidone to 50mg  per day - I sent a new prescription.  Recheck electrolytes in the next 1 week at a lab only visit.  Try to start monitoring your blood pressures at home and let me know those readings.  No other medication changes for now.  I will check sexually-transmitted infection testing, work on them every time for now.  If any continued discomfort, let me know and I can have you see urology.  If he has not heard from the cardiologist in the next week, I would recommend calling their office to determine when the rescheduled visit with cardiology will be.  Return to the clinic or go to the nearest emergency room if any of your symptoms worsen or new symptoms occur.   If you have lab work done today you will be contacted with your lab results within the next 2 weeks.  If you have not heard from [place] then please contact us. The fastest way to get your results is to register for My Chart.   IF you received an x-ray today, you will receive an invoice from [facility]. Please contact [facility] at [phone] with questions or concerns regarding your invoice.   IF you received labwork today, you will receive an invoice from [place]. Please contact [facility] at [phone] with questions or concerns regarding your invoice.   Our billing staff will not be able to assist you with questions regarding bills from these companies.  You will be contacted with the lab results as soon as they are available. The fastest way to get your results is to activate your My Chart account. Instructions are located on the last page of this paperwork. If you have not heard from [place] regarding the results in 2 weeks, please contact this office.

## 2020-05-18 NOTE — Telephone Encounter (Addendum)
05/18/2020 - PATIENT SAW DR. Carlota Raspberry IN THE OFFICE ON Friday (05/18/2020). DR. Carlota Raspberry HAS REQUESTED HE RETURN IN 1 MONTH TO HAVE A RECHECK ON HIS BLOOD PRESSURE. HE ALSO WANTS HIM TO COME BACK IN 1 WEEK TO HAVE LABS DRAWN. THE 1 MONTH F/U HAS BEEN SCHEDULED BUT PATIENT WILL HAVE TO CALL BACK TO SCHEDULE HIS LAB APPOINTMENT AFTER HE CHECKS HIS WORK SCHEDULE. [place]

## 2020-05-18 NOTE — Progress Notes (Signed)
Subjective:  Patient ID: Michael [name], male    DOB: April 19, 1971  Age: 49 y.o. MRN: [id number]  CC:  Chief Complaint  Patient presents with  . Follow-up    x3 weeks Hypertension    HPI [name] presents for   Hypertension: Previous chlorthalidone increased to 50 mg with consistent dosing of medications have been discussed.  Improved reading at last visit April 21.  Previous hypokalemia resolved.-Recommended 20 mEq daily previously, potassium 3.8 on April 21.  Consistent timing of medications was discussed as well as continued compliance with BiPAP  Home readings: 136/92 at home visit on 04/19/20.  No other recent readings - recent move, will need to find machine.  Taking coreg 6.25mg  BID, lisinopril 40mg  qd, amlodipine 10mg  qd. clorthalidone 25mg  qd. On work days - take carvedilol and potassium in am, then others at 11 am (compared to around 8-9 am). Took all meds this am.  Cardiology visit changed - they will call and reschedule.   BP Readings from Last 3 Encounters:  05/18/20 (!) 139/95  04/18/20 130/80  04/11/20 (!) 160/106   Lab Results  Component Value Date   CREATININE 1.19 04/18/2020   Depression: Symptoms stable when discussed early April.  Continued on Wellbutrin 150 mg once per day at that time and recommended starting trazodone to see if that helped with fatigue. Feels like depression doing ok - on wellbutrin once per day.  Stopped trazodone - less fatigue. Sleeping ok.   Depression screen [facility] 2/9 05/18/2020 04/18/2020 04/11/2020 03/22/2020 04/02/2017  Decreased Interest 0 1 0 1 0  Down, Depressed, Hopeless 0 2 0 3 0  PHQ - 2 Score 0 3 0 4 0  Altered sleeping 0 3 - 1 -  Tired, decreased energy 0 3 - 2 -  Change in appetite 0 2 - 2 -  Feeling bad or failure about yourself  0 2 - 2 -  Trouble concentrating 0 2 - 1 -  Moving slowly or fidgety/restless 0 2 - 0 -  Suicidal thoughts 0 0 - 2 -  PHQ-9 Score 0 17 - 14 -  Difficult doing work/chores Not difficult at all  Not difficult at all - - -   Additional concern at end of visit: Burning in penis during intercouse only, not with urination. Noticed last week. Has been using condom every time. No penile d/c. No STI in past. Had unprotected intercourse about a month ago - partner may have had gonorrhea. Condoms every time since then Negative STI screening in March.    History Patient Active Problem List   Diagnosis Date Noted  . Morbid obesity with body mass index (BMI) of 40.0 to 44.9 in adult [facility]) 09/21/2019  . CHF (congestive heart failure), NYHA class I, acute, diastolic ([place]) [id number]  . Central sleep apnea comorbid with prescribed opioid use 09/21/2019  . History of uncontrolled hypertension 09/21/2019  . Complex sleep apnea syndrome 08/18/2019  . Claustrophobia 08/18/2019  . Anxiety associated with depression 08/18/2019  . Hypokalemia 04/04/2017  . Morbid obesity ([place]) 04/04/2017  . Chest wall pain 04/04/2017  . Asthma 02/13/2016  . Non compliance w medication regimen 06/13/2015  . OSA (obstructive sleep apnea) 04/06/2013  . Abnormal EKG 11/08/2012  . Essential hypertension   . HTN (hypertension), malignant 04/29/2012  . Migraine headache with aura 04/29/2012   Past Medical History:  Diagnosis Date  . Anxiety   . Depression    Questionable per records. Not on any medication.   Marland Kitchen History  of kidney stones   . Hx of echocardiogram    a. Echo 12/13/12: Mild LVH, EF 99991111, grade 1 diastolic dysfunction, mild LAE, mild RVE, mild RAE  . Hypertension   . Insomnia   . Migraine   . Noncompliance   . OSA (obstructive sleep apnea)    a. Sleep Study 02/2013:  mod OSA, AHI 33 per hour, O2 sat nadir 85%   Past Surgical History:  Procedure Laterality Date  . Admission  04/2012   Malignant HTN, HA.  CT head negative, cardiology consult.    . CYSTOSCOPY WITH RETROGRADE PYELOGRAM, URETEROSCOPY AND STENT PLACEMENT Left 01/24/2019   Procedure: CYSTOSCOPY WITH RETROGRADE PYELOGRAM, URETEROSCOPY AND  STENT PLACEMENT;  Surgeon: Cleon Gustin, MD;  Location: [facility];  Service: Urology;  Laterality: Left;  . HOLMIUM LASER APPLICATION Left [id number]   Procedure: HOLMIUM LASER APPLICATION;  Surgeon: Cleon Gustin, MD;  Location: [facility];  Service: Urology;  Laterality: Left;  Marland Kitchen MANDIBLE SURGERY  1998   metal plate   Allergies  Allergen Reactions  . Statins Other (See Comments)    unknown   Prior to Admission medications   Medication Sig Start Date End Date Taking? Authorizing Provider  amLODipine (NORVASC) 10 MG tablet Take 1 tablet (10 mg total) by mouth daily. 03/22/20  Yes [name], MD  buPROPion (WELLBUTRIN XL) 150 MG 24 hr tablet Take 1 tablet by mouth every day 05/03/20  Yes [name], MD  carvedilol (COREG) 6.25 MG tablet Take 1 tablet (6.25 mg total) by mouth 2 (two) times daily. 03/22/20  Yes [name], MD  chlorthalidone (HYGROTON) 25 MG tablet Take 1 tablet (25 mg total) by mouth daily. 03/22/20  Yes [name], MD  lisinopril (ZESTRIL) 40 MG tablet Take 1 tablet by mouth every day 04/19/20  Yes [name], MD  predniSONE (DELTASONE) 20 MG tablet  04/05/20  Yes [provider]  predniSONE (STERAPRED UNI-PAK 21 TAB) 10 MG (21) TBPK tablet Take 10 mg by mouth as directed. 04/05/20  Yes [provider]  tiZANidine (ZANAFLEX) 4 MG tablet Take 1 tablet (4 mg total) by mouth at bedtime as needed (For hip pain at that time). 03/04/20  Yes Scot Jun, FNP  traMADol (ULTRAM) 50 MG tablet Take 1 tablet (50 mg total) by mouth every 6 (six) hours as needed. 02/26/20  Yes Jaynee Eagles, PA-C  traZODone (DESYREL) 50 MG tablet Take 1 tablet (50 mg total) by mouth at bedtime as needed for sleep. 03/26/20  Yes Lomax, Amy, NP   Social History   Socioeconomic History  . Marital status: Divorced    Spouse name: Not on file  . Number of children: 4  . Years of education: Not on file  . Highest  education level: Not on file  Occupational History  . Occupation: Unemployed//disabled  Tobacco Use  . Smoking status: Never Smoker  . Smokeless tobacco: Never Used  Substance and Sexual Activity  . Alcohol use: No    Alcohol/week: 0.0 standard drinks  . Drug use: No  . Sexual activity: Yes    Comment: per pt's blue health form - 1 sex partner in last 12 months  Other Topics Concern  . Not on file  Social History Narrative   Marital status: single; living with fiance      Children: 4 children, no grandchildren.      Employment:  Disability for learning disabilities since 2012.  Right-handed   Caffeine: occasional         Social Determinants of Radio broadcast assistant Strain:   . Difficulty of Paying Living Expenses:   Food Insecurity:   . Worried About Charity fundraiser in the Last Year:   . Arboriculturist in the Last Year:   Transportation Needs:   . Film/video editor (Medical):   Marland Kitchen Lack of Transportation (Non-Medical):   Physical Activity:   . Days of Exercise per Week:   . Minutes of Exercise per Session:   Stress:   . Feeling of Stress :   Social Connections:   . Frequency of Communication with Friends and Family:   . Frequency of Social Gatherings with Friends and Family:   . Attends Religious Services:   . Active Member of Clubs or Organizations:   . Attends Archivist Meetings:   Marland Kitchen Marital Status:   Intimate Partner Violence:   . Fear of Current or Ex-Partner:   . Emotionally Abused:   Marland Kitchen Physically Abused:   . Sexually Abused:     Review of Systems  Constitutional: Negative for fatigue and unexpected weight change.  Eyes: Negative for visual disturbance.  Respiratory: Negative for cough, chest tightness and shortness of breath.   Cardiovascular: Negative for chest pain, palpitations and leg swelling.  Gastrointestinal: Negative for abdominal pain and blood in stool.  Genitourinary: Positive for penile pain. Negative for discharge  and genital sores.  Neurological: Negative for dizziness, light-headedness and headaches.     Objective:   Vitals:   05/18/20 1113 05/18/20 1206  BP: (!) 153/110 (!) 139/95  Pulse: 76   Temp: 98.2 F (36.8 C)   TempSrc: Temporal   SpO2: 98%   Weight: 299 lb 3.2 oz (135.7 kg)   Height: 5\' 9"  (1.753 m)      Physical Exam Vitals reviewed.  Constitutional:      Appearance: He is well-developed.  HENT:     Head: Normocephalic and atraumatic.  Eyes:     Pupils: Pupils are equal, round, and reactive to light.  Neck:     Vascular: No carotid bruit or JVD.  Cardiovascular:     Rate and Rhythm: Normal rate. Rhythm irregular.     Heart sounds: Normal heart sounds. No murmur.     Comments: Ectopic beat every 1-2 beats, normal rate.  Pulmonary:     Effort: Pulmonary effort is normal.     Breath sounds: Normal breath sounds. No rales.  Skin:    General: Skin is warm and dry.  Neurological:     Mental Status: He is alert and oriented to person, place, and time.  Psychiatric:        Mood and Affect: Mood normal.        Behavior: Behavior normal.     EKG: Sinus rhythm, rate 71, frequent PACs.  No acute abnormalities, compared to 04/18/20.   Assessment & Plan:  Carson Reek is a 49 y.o. male . Essential hypertension - Plan: Basic metabolic panel, chlorthalidone (HYGROTON) 50 MG tablet  -Still decreased control.  We will try higher dose of chlorthalidone at 50 mg daily, check baseline BMP today and then lab only visit in 1 week with history of hypokalemia. In office recheck in 1 month.  RTC precautions/ER precautions.  OSA treated with BiPAP  -Compliant, continue BiPAP.  Hypokalemia - Plan: Basic metabolic panel, Basic metabolic panel  -Normal last visit, repeat today and in 1 week with  change in chlorthalidone.  Depression, unspecified depression type  -Stable with Wellbutrin 150 mg daily.  Continue same.  Irregular heart rate - Plan: EKG 12-Lead  -Asymptomatic, PACs  noted on EKG, planned cardiology appointment had to be rescheduled.  Advised him to call their office if he has not heard about the new appointment.  Penile pain - Plan: GC/Chlamydia probe amp ([place])not at [facility] Exposure to sexually transmitted disease (STD) - Plan: GC/Chlamydia probe amp ([place])not at [facility], RPR, HIV Antibody (routine testing w rflx)  -Denies discharge or acute symptoms at this time.  Exam deferred at this time but will check for STIs with RTC precautions if persistent or normal labs.   Meds ordered this encounter  Medications  . chlorthalidone (HYGROTON) 50 MG tablet    Sig: Take 1 tablet (50 mg total) by mouth daily.    Dispense:  30 tablet    Refill:  1   Patient Instructions    Blood pressure still running a little bit too high.  Increase chlorthalidone to 50mg  per day - I sent a new prescription.  Recheck electrolytes in the next 1 week at a lab only visit.  Try to start monitoring your blood pressures at home and let me know those readings.  No other medication changes for now.  I will check sexually-transmitted infection testing, work on them every time for now.  If any continued discomfort, let me know and I can have you see urology.  If he has not heard from the cardiologist in the next week, I would recommend calling their office to determine when the rescheduled visit with cardiology will be.  Return to the clinic or go to the nearest emergency room if any of your symptoms worsen or new symptoms occur.   If you have lab work done today you will be contacted with your lab results within the next 2 weeks.  If you have not heard from [place] then please contact us. The fastest way to get your results is to register for My Chart.   IF you received an x-ray today, you will receive an invoice from [facility]. Please contact [facility] at [phone] with questions or concerns regarding your invoice.   IF you received labwork today, you  will receive an invoice from [facility]. Please contact [facility] at [phone] with questions or concerns regarding your invoice.   Our billing staff will not be able to assist you with questions regarding bills from these companies.  You will be contacted with the lab results as soon as they are available. The fastest way to get your results is to activate your My Chart account. Instructions are located on the last page of this paperwork. If you have not heard from [place] regarding the results in 2 weeks, please contact this office.         Signed, [name], MD [facility]

## 2020-05-19 ENCOUNTER — Other Ambulatory Visit: Payer: Self-pay | Admitting: Family Medicine

## 2020-05-19 DIAGNOSIS — F32A Depression, unspecified: Secondary | ICD-10-CM

## 2020-05-19 LAB — BASIC METABOLIC PANEL
BUN/Creatinine Ratio: 16 (ref 9–20)
BUN: 16 mg/dL (ref 6–24)
CO2: 24 mmol/L (ref 20–29)
Calcium: 9.2 mg/dL (ref 8.7–10.2)
Chloride: 104 mmol/L (ref 96–106)
Creatinine, Ser: 1 mg/dL (ref 0.76–1.27)
GFR calc Af Amer: 101 mL/min/{1.73_m2} (ref >59)
GFR calc non Af Amer: 87 mL/min/{1.73_m2} (ref >59)
Glucose: 86 mg/dL (ref 65–99)
Potassium: 3.7 mmol/L (ref 3.5–5.2)
Sodium: 143 mmol/L (ref 134–144)

## 2020-05-19 LAB — RPR: RPR Ser Ql: NONREACTIVE

## 2020-05-19 LAB — HIV ANTIBODY (ROUTINE TESTING W REFLEX): HIV Screen 4th Generation wRfx: NONREACTIVE

## 2020-05-21 LAB — GC/CHLAMYDIA PROBE AMP (~~LOC~~) NOT AT ARMC
Chlamydia: NEGATIVE
Comment: NEGATIVE
Comment: NORMAL
Neisseria Gonorrhea: NEGATIVE

## 2020-05-24 ENCOUNTER — Other Ambulatory Visit: Payer: Self-pay

## 2020-05-24 ENCOUNTER — Ambulatory Visit (INDEPENDENT_AMBULATORY_CARE_PROVIDER_SITE_OTHER): Payer: Medicare Other | Admitting: Family Medicine

## 2020-05-24 DIAGNOSIS — E876 Hypokalemia: Secondary | ICD-10-CM | POA: Diagnosis not present

## 2020-05-25 LAB — BASIC METABOLIC PANEL
BUN/Creatinine Ratio: 14 (ref 9–20)
BUN: 15 mg/dL (ref 6–24)
CO2: 24 mmol/L (ref 20–29)
Calcium: 9.1 mg/dL (ref 8.7–10.2)
Chloride: 103 mmol/L (ref 96–106)
Creatinine, Ser: 1.07 mg/dL (ref 0.76–1.27)
GFR calc Af Amer: 93 mL/min/{1.73_m2} (ref >59)
GFR calc non Af Amer: 81 mL/min/{1.73_m2} (ref >59)
Glucose: 98 mg/dL (ref 65–99)
Potassium: 3.7 mmol/L (ref 3.5–5.2)
Sodium: 142 mmol/L (ref 134–144)

## 2020-05-31 ENCOUNTER — Other Ambulatory Visit: Payer: Self-pay | Admitting: Family Medicine

## 2020-05-31 ENCOUNTER — Telehealth: Payer: Self-pay | Admitting: *Deleted

## 2020-05-31 DIAGNOSIS — E876 Hypokalemia: Secondary | ICD-10-CM

## 2020-05-31 DIAGNOSIS — I10 Essential (primary) hypertension: Secondary | ICD-10-CM

## 2020-05-31 NOTE — Telephone Encounter (Signed)
I called pt and to see if could come in 06-04-20 at 2:30pm with MM/NP for bipap f/u (r/s from [facility] when she was out).  He is to call and confirm.

## 2020-05-31 NOTE — Telephone Encounter (Signed)
Please verify, but my understanding at his May 21 visit was that he was taking potassium 20 mEq daily.  If so then  continue same dose as potassium level was controlled with the supplement.  With his other medications, it could drop low again if we stop it.  Thanks

## 2020-05-31 NOTE — Telephone Encounter (Signed)
Copied from [place] [id number]. Topic: Quick Communication - Rx Refill/Question >> May 31, 2020  1:08 PM Michael [name], [place] A wrote: Medication: lisinopril (ZESTRIL) 40 MG tablet ,potassium chloride SA (K-DUR,KLOR-CON) CR tablet 40 mEq ,lisinopril (ZESTRIL) 40 MG tablet   Has the patient contacted their pharmacy? yes (Agent: If no, request that the patient contact the pharmacy for the refill.) (Agent: If yes, when and what did the pharmacy advise?)contact pcp  Preferred Pharmacy (with phone number or street name): [facility], [address] [place]  Phone:  [phone] Fax:  [phone]     Agent: Please be advised that RX refills may take up to 3 business days. We ask that you follow-up with your pharmacy.

## 2020-05-31 NOTE — Telephone Encounter (Signed)
Does pt need potassium still? His levels have been normal the last three tests

## 2020-05-31 NOTE — Telephone Encounter (Signed)
Pt. Requesting refill on Lisinopril and Potassium.  Noted that the Lisinopril has already been reordered today.  Potassium is not on active medication sheet.  Noted that on pt's BMP on 5/21 and 5/27, the  K+ level was 3.7.  Will send to PCP to review if pt. needs to take potassium supplement.

## 2020-06-01 NOTE — Telephone Encounter (Signed)
I have called the pt back and informed him of the message and clarified the medication that he is on. He will continue the medication and take 2 tabs of 25 mg of Hygrotoin to equal 50 mg for that is what the provider told him to do last visit.

## 2020-06-04 ENCOUNTER — Telehealth: Payer: Self-pay

## 2020-06-04 ENCOUNTER — Ambulatory Visit: Payer: Self-pay | Admitting: Adult Health

## 2020-06-04 NOTE — Telephone Encounter (Signed)
LVM due to no cpap data

## 2020-06-05 ENCOUNTER — Ambulatory Visit: Payer: Self-pay | Admitting: Adult Health

## 2020-06-05 ENCOUNTER — Encounter: Payer: Self-pay | Admitting: Adult Health

## 2020-06-05 NOTE — Telephone Encounter (Signed)
LVM  For pt to call office back, no cpap data

## 2020-06-05 NOTE — Telephone Encounter (Signed)
Patient called.

## 2020-06-14 ENCOUNTER — Ambulatory Visit (AMBULATORY_SURGERY_CENTER): Payer: Self-pay | Admitting: *Deleted

## 2020-06-14 ENCOUNTER — Other Ambulatory Visit: Payer: Self-pay

## 2020-06-14 VITALS — Ht 69.0 in | Wt 290.0 lb

## 2020-06-14 DIAGNOSIS — Z1211 Encounter for screening for malignant neoplasm of colon: Secondary | ICD-10-CM

## 2020-06-14 DIAGNOSIS — Z800 Family history of malignant neoplasm of digestive organs: Secondary | ICD-10-CM

## 2020-06-14 MED ORDER — PEG 3350-KCL-NA BICARB-NACL 420 G PO SOLR: 4000.0000 mL | Freq: Once | ORAL | 0 refills | Status: AC

## 2020-06-14 NOTE — Progress Notes (Signed)

## 2020-06-21 ENCOUNTER — Other Ambulatory Visit: Payer: Self-pay | Admitting: Family Medicine

## 2020-06-21 DIAGNOSIS — I10 Essential (primary) hypertension: Secondary | ICD-10-CM

## 2020-06-22 ENCOUNTER — Ambulatory Visit: Payer: Medicare Other | Admitting: Family Medicine

## 2020-06-26 ENCOUNTER — Other Ambulatory Visit: Payer: Self-pay | Admitting: Family Medicine

## 2020-06-26 DIAGNOSIS — E876 Hypokalemia: Secondary | ICD-10-CM

## 2020-06-26 NOTE — Telephone Encounter (Signed)
Please see OV in April and May. This med was discontinued.

## 2020-06-26 NOTE — Telephone Encounter (Signed)
Requested medication (s) are due for refill today: yes  Requested medication (s) are on the active medication list: no  Last refill:  ?  Future visit scheduled: no  Notes to clinic:  not on active med list    Requested Prescriptions  Pending Prescriptions Disp Refills   traZODone (DESYREL) 50 MG tablet [Pharmacy Med Name: Trazodone Hydrochloride 50mg  Tablet] 30 tablet 11    Sig: Take 1 tablet by mouth at bedtime as needed for sleep.      Psychiatry: Antidepressants - Serotonin Modulator Passed - 06/26/2020 10:30 AM      Passed - Completed PHQ-2 or PHQ-9 in the last 360 days.      Passed - Valid encounter within last 6 months    Recent Outpatient Visits           1 month ago Hypokalemia   [facility], Ranell Patrick, MD   1 month ago Essential hypertension   [facility], Ranell Patrick, MD   2 months ago Hypokalemia   [facility], Ranell Patrick, MD   2 months ago Prolonged QT interval   [facility], Ranell Patrick, MD   3 months ago Essential hypertension   [facility], Ranell Patrick, MD

## 2020-06-26 NOTE — Telephone Encounter (Signed)
Patient is requesting a refill of the following medications: Requested Prescriptions   Pending Prescriptions Disp Refills  . traZODone (DESYREL) 50 MG tablet [Pharmacy Med Name: Trazodone Hydrochloride 50mg  Tablet] 30 tablet 11    Sig: Take 1 tablet by mouth at bedtime as needed for sleep.    Date of patient request: 06/26/2020 Last office visit:05/24/2020 Date of last refill: 03/26/2020 Last refill amount:90 tablets  Follow up time period per chart: N/A

## 2020-06-28 ENCOUNTER — Encounter: Payer: Self-pay | Admitting: Gastroenterology

## 2020-06-28 ENCOUNTER — Other Ambulatory Visit: Payer: Self-pay

## 2020-06-28 ENCOUNTER — Telehealth: Payer: Self-pay | Admitting: Family Medicine

## 2020-06-28 ENCOUNTER — Ambulatory Visit (AMBULATORY_SURGERY_CENTER): Payer: Medicare Other | Admitting: Gastroenterology

## 2020-06-28 VITALS — BP 148/104 | HR 61 | Temp 98.0°F | Resp 11 | Ht 69.0 in | Wt 305.0 lb

## 2020-06-28 DIAGNOSIS — A630 Anogenital (venereal) warts: Secondary | ICD-10-CM | POA: Diagnosis not present

## 2020-06-28 DIAGNOSIS — Z800 Family history of malignant neoplasm of digestive organs: Secondary | ICD-10-CM | POA: Diagnosis not present

## 2020-06-28 DIAGNOSIS — Z1211 Encounter for screening for malignant neoplasm of colon: Secondary | ICD-10-CM

## 2020-06-28 DIAGNOSIS — F32A Depression, unspecified: Secondary | ICD-10-CM

## 2020-06-28 MED ORDER — BUPROPION HCL ER (XL) 150 MG PO TB24: 150.0000 mg | ORAL_TABLET | Freq: Every day | ORAL | 1 refills | Status: DC

## 2020-06-28 MED ORDER — SODIUM CHLORIDE 0.9 % IV SOLN: 500.0000 mL | INTRAVENOUS | Status: AC

## 2020-06-28 NOTE — Telephone Encounter (Signed)
Patient requested potassium 10 meq refill, last given in march 2021 , please refill if appropriate. Not on current medication list .

## 2020-06-28 NOTE — Progress Notes (Signed)
To PACU, VSS. Report to [email or web address] 

## 2020-06-28 NOTE — Telephone Encounter (Signed)
Patient stated that his medication was supposed to go to    [facility], [place]

## 2020-06-28 NOTE — Progress Notes (Signed)
Pt's states no medical or surgical changes since previsit or office visit. 

## 2020-06-28 NOTE — Op Note (Signed)
[facility] Patient Name: Michael [name] Procedure Date: 06/28/2020 11:10 AM MRN: [id number] Endoscopist: Ladene Artist , MD Age: 49 Referring MD:  Date of Birth: 10-09-1971 Gender: Male Account #: [id number] Procedure:                Colonoscopy Indications:              Screening in patient at increased risk: Family                            history of 1st-degree relative with colorectal                            cancer Medicines:                Monitored Anesthesia Care Procedure:                Pre-Anesthesia Assessment:                           - Prior to the procedure, a History and Physical                            was performed, and patient medications and                            allergies were reviewed. The patient's tolerance of                            previous anesthesia was also reviewed. The risks                            and benefits of the procedure and the sedation                            options and risks were discussed with the patient.                            All questions were answered, and informed consent                            was obtained. Prior Anticoagulants: The patient has                            taken no previous anticoagulant or antiplatelet                            agents. ASA Grade Assessment: II - A patient with                            mild systemic disease. After reviewing the risks                            and benefits, the patient was deemed in  satisfactory condition to undergo the procedure.                           After obtaining informed consent, the colonoscope                            was passed under direct vision. Throughout the                            procedure, the patient's blood pressure, pulse, and                            oxygen saturations were monitored continuously. The                            Colonoscope was introduced through the anus and                             advanced to the the cecum, identified by                            appendiceal orifice and ileocecal valve. The                            ileocecal valve, appendiceal orifice, and rectum                            were photographed. The quality of the bowel                            preparation was good. The colonoscopy was performed                            without difficulty. The patient tolerated the                            procedure well. Scope In: 11:26:37 AM Scope Out: 11:41:40 AM Scope Withdrawal Time: 0 hours 12 minutes 34 seconds  Total Procedure Duration: 0 hours 15 minutes 3 seconds  Findings:                 The perianal and digital rectal examinations were                            normal.                           Multiple small-mouthed diverticula were found in                            the left colon. There was evidence of diverticular                            spasm. There was no evidence of diverticular  bleeding.                           Internal hemorrhoids were found during                            retroflexion. The hemorrhoids were small and Grade                            I (internal hemorrhoids that do not prolapse).                           Anal papilla(e) were hypertrophied. Biopsies were                            taken with a cold forceps for histology.                           The exam was otherwise without abnormality on                            direct and retroflexion views. Complications:            No immediate complications. Estimated blood loss:                            None. Estimated Blood Loss:     Estimated blood loss: none. Impression:               - Moderate diverticulosis in the left colon.                           - Internal hemorrhoids.                           - Anal papilla(e) were hypertrophied. Biopsied.                           - The examination was otherwise normal on direct                             and retroflexion views. Recommendation:           - Repeat colonoscopy in 5 years for screening                            purposes.                           - Patient has a contact number available for                            emergencies. The signs and symptoms of potential                            delayed complications were discussed with the  patient. Return to normal activities tomorrow.                            Written discharge instructions were provided to the                            patient.                           - High fiber diet.                           - Continue present medications.                           - Await pathology results. Ladene Artist, MD 06/28/2020 11:45:24 AM This report has been signed electronically.

## 2020-06-28 NOTE — Patient Instructions (Signed)
Read all of the handouts given to you by your recovery room nurse.   Thank-you for choosing us for your healthcare needs today.  YOU HAD AN ENDOSCOPIC PROCEDURE TODAY AT THE [facility]:   Refer to the procedure report that was given to you for any specific questions about what was found during the examination.  If the procedure report does not answer your questions, please call your gastroenterologist to clarify.  If you requested that your care partner not be given the details of your procedure findings, then the procedure report has been included in a sealed envelope for you to review at your convenience later.  YOU SHOULD EXPECT: Some feelings of bloating in the abdomen. Passage of more gas than usual.  Walking can help get rid of the air that was put into your GI tract during the procedure and reduce the bloating. If you had a lower endoscopy (such as a colonoscopy or flexible sigmoidoscopy) you may notice spotting of blood in your stool or on the toilet paper. If you underwent a bowel prep for your procedure, you may not have a normal bowel movement for a few days.  Please Note:  You might notice some irritation and congestion in your nose or some drainage.  This is from the oxygen used during your procedure.  There is no need for concern and it should clear up in a day or so.  SYMPTOMS TO REPORT IMMEDIATELY:   Following lower endoscopy (colonoscopy or flexible sigmoidoscopy):  Excessive amounts of blood in the stool  Significant tenderness or worsening of abdominal pains  Swelling of the abdomen that is new, acute  Fever of 100F or higher   For urgent or emergent issues, a gastroenterologist can be reached at any hour by calling [phone]. Do not use MyChart messaging for urgent concerns.    DIET:  We do recommend a small meal at first, but then you may proceed to your regular diet.  Drink plenty of fluids but you should avoid alcoholic beverages for 24  hours.  ACTIVITY:  You should plan to take it easy for the rest of today and you should NOT DRIVE or use heavy machinery until tomorrow (because of the sedation medicines used during the test).    FOLLOW UP: Our staff will call the number listed on your records 48-72 hours following your procedure to check on you and address any questions or concerns that you may have regarding the information given to you following your procedure. If we do not reach you, we will leave a message.  We will attempt to reach you two times.  During this call, we will ask if you have developed any symptoms of COVID 19. If you develop any symptoms (ie: fever, flu-like symptoms, shortness of breath, cough etc.) before then, please call [phone].  If you test positive for Covid 19 in the 2 weeks post procedure, please call and report this information to us.    If any biopsies were taken you will be contacted by phone or by letter within the next 1-3 weeks.  Please call us at [phone] if you have not heard about the biopsies in 3 weeks.    SIGNATURES/CONFIDENTIALITY: You and/or your care partner have signed paperwork which will be entered into your electronic medical record.  These signatures attest to the fact that that the information above on your After Visit Summary has been reviewed and is understood.  Full responsibility of the confidentiality of this discharge   your care-partner.  ?

## 2020-06-28 NOTE — Progress Notes (Signed)
Called to room to assist during endoscopic procedure.  Patient ID and intended procedure confirmed with present staff. Received instructions for my participation in the procedure from the performing physician.  

## 2020-06-29 MED ORDER — POTASSIUM CHLORIDE ER 10 MEQ PO TBCR: 10.0000 meq | EXTENDED_RELEASE_TABLET | Freq: Every day | ORAL | 2 refills | Status: DC

## 2020-06-29 NOTE — Addendum Note (Signed)
Addended by: [name] R on: 06/29/2020 05:39 AM   Modules accepted: Orders

## 2020-06-29 NOTE — Telephone Encounter (Signed)
Called and spoke with pt he is still taking the potassium 10 mEq once daily he would like future refills to go to [facility] on [address].

## 2020-06-29 NOTE — Telephone Encounter (Signed)
Electrolytes normalized last labs.  This was on potassium to my knowledge.  If he is still taking potassium once per day I have refilled it for the same.  Please call and clarify.  Let me know if different dosing.  Thanks

## 2020-07-03 ENCOUNTER — Telehealth: Payer: Self-pay

## 2020-07-03 ENCOUNTER — Telehealth: Payer: Self-pay | Admitting: *Deleted

## 2020-07-03 NOTE — Telephone Encounter (Signed)
Follow up call made, left message. 

## 2020-07-03 NOTE — Telephone Encounter (Signed)
LVM

## 2020-07-03 NOTE — Telephone Encounter (Signed)
Second attempt follow up call to pt, lm on vm. 

## 2020-07-09 ENCOUNTER — Other Ambulatory Visit: Payer: Self-pay | Admitting: Family Medicine

## 2020-07-09 DIAGNOSIS — I10 Essential (primary) hypertension: Secondary | ICD-10-CM

## 2020-07-10 ENCOUNTER — Telehealth: Payer: Self-pay

## 2020-07-10 ENCOUNTER — Telehealth: Payer: Self-pay | Admitting: Family Medicine

## 2020-07-10 NOTE — Telephone Encounter (Signed)
I called [phone] and let them know-The last sleep study I could find was referred from [facility],. I gave her this information.

## 2020-07-10 NOTE — Telephone Encounter (Signed)
Called patient back again. See result note

## 2020-07-10 NOTE — Telephone Encounter (Signed)
Left message for patient to please call back. 

## 2020-07-10 NOTE — Telephone Encounter (Signed)
-----   Message from Ladene Artist, MD sent at 07/09/2020  4:29 PM EDT ----- Barbera Setters,  Please call patient with path result, recommendations:  Condyloma acuminatum - [facility] referral for treatment Colonoscopy 06/2025  Path letter not needed

## 2020-07-10 NOTE — Telephone Encounter (Signed)
-----   Message from Ladene Artist, MD sent at 07/09/2020  4:29 PM EDT ----- Michael [name],  Please call patient with path result, recommendations:  Condyloma acuminatum - [facility] referral for treatment Colonoscopy 06/2025  Path letter not needed

## 2020-07-10 NOTE — Telephone Encounter (Signed)
[facility] calling to follow up paperwork faxed over that needed to be completed by Dr, Carlota Raspberry also Needing copy of diagnostic sleep study that was done several years ago and chart notes from visit where  the sleep study was discussed    Please reach out to [facility] at [phone] option 2

## 2020-07-10 NOTE — Telephone Encounter (Signed)
Pt called,  returning your call

## 2020-07-11 ENCOUNTER — Other Ambulatory Visit: Payer: Self-pay | Admitting: Family Medicine

## 2020-07-11 ENCOUNTER — Telehealth: Payer: Self-pay | Admitting: Family Medicine

## 2020-07-11 NOTE — Telephone Encounter (Signed)
Received paperwork from [facility]. Left in provider box at nurses station. Please advise.

## 2020-07-11 NOTE — Telephone Encounter (Signed)
Sent to different pharmacy

## 2020-07-11 NOTE — Telephone Encounter (Signed)
error 

## 2020-07-12 ENCOUNTER — Other Ambulatory Visit: Payer: Self-pay

## 2020-07-12 NOTE — Telephone Encounter (Signed)
No action needed. Karac Dr Vonna Kotyk CMA is already awar of the form and patient has an upcoming appt

## 2020-07-13 ENCOUNTER — Other Ambulatory Visit: Payer: Self-pay

## 2020-07-13 ENCOUNTER — Ambulatory Visit (INDEPENDENT_AMBULATORY_CARE_PROVIDER_SITE_OTHER): Payer: Medicare Other | Admitting: Family Medicine

## 2020-07-13 ENCOUNTER — Other Ambulatory Visit (HOSPITAL_COMMUNITY)
Admission: RE | Admit: 2020-07-13 | Discharge: 2020-07-13 | Disposition: A | Discharge status: Home / Self Care | Payer: Medicare Other | Source: Ambulatory Visit | Attending: Family Medicine | Admitting: Family Medicine

## 2020-07-13 VITALS — BP 152/113 | HR 91 | Temp 97.6°F | Ht 69.0 in | Wt 296.6 lb

## 2020-07-13 DIAGNOSIS — E663 Overweight: Secondary | ICD-10-CM

## 2020-07-13 DIAGNOSIS — Z202 Contact with and (suspected) exposure to infections with a predominantly sexual mode of transmission: Secondary | ICD-10-CM | POA: Diagnosis present

## 2020-07-13 DIAGNOSIS — I10 Essential (primary) hypertension: Secondary | ICD-10-CM

## 2020-07-13 LAB — POCT URINALYSIS DIP (MANUAL ENTRY)
Bilirubin, UA: NEGATIVE
Glucose, UA: NEGATIVE mg/dL
Ketones, POC UA: NEGATIVE mg/dL
Leukocytes, UA: NEGATIVE
Nitrite, UA: NEGATIVE
Protein Ur, POC: 30 mg/dL — AB
Spec Grav, UA: >=1.03 — AB (ref 1.010–1.025)
Urobilinogen, UA: 0.2 E.U./dL
pH, UA: 5.5 (ref 5.0–8.0)

## 2020-07-13 NOTE — Patient Instructions (Addendum)
  In the event that you do not hear from someone regarding these tests by the middle of next week call back over here and ask for the results since I will be out of town.  You are being tested for syphilis, herpes, gonorrhea, HIV, and chlamydia.  I added the herpes test because it can sometimes cause the burning sensation when you pee.  If you choose to be sexually involved, you should use condoms 100% of the time.  No sexual activity at all until you have the results of these tests.  Your blood pressure continues to run too high.  When I repeated that I got 148/108.  Our goal is to have it consistently below 140/90, and then ideal blood pressure is really down like 120/70.  I urged you to continue cutting back on your eating until that weight continues to come down.  Increase the carvedilol from 6.25 to 12.5 mg twice daily.  You have plenty of the 6.25 mg tablets, so would like you to double up on them and take 2 in the morning and 2 in the evening until you follow-up with Dr. Carlota Raspberry.  Return as needed   If you have lab work done today you will be contacted with your lab results within the next 2 weeks.  If you have not heard from [place] then please contact us. The fastest way to get your results is to register for My Chart.   IF you received an x-ray today, you will receive an invoice from [facility]. Please contact [facility] at [phone] with questions or concerns regarding your invoice.   IF you received labwork today, you will receive an invoice from [facility]. Please contact [facility] at [phone] with questions or concerns regarding your invoice.   Our billing staff will not be able to assist you with questions regarding bills from these companies.  You will be contacted with the lab results as soon as they are available. The fastest way to get your results is to activate your My Chart account. Instructions are located on the last page of this paperwork. If you have  not heard from [place] regarding the results in 2 weeks, please contact this office.

## 2020-07-13 NOTE — Progress Notes (Signed)
Patient ID: Michael [name], male    DOB: November 15, 1971  Age: 49 y.o. MRN: [id number]  Chief Complaint  Patient presents with  . std testing    Pt came in today and stated that the lady that he had relations with told him that she had a STD but did not tell him what she had so he is here to get tested.    Subjective:   49 year old gentleman who was told by his sexual partner that she had tested positive.  She apparently got a bunch of penicillin shots and some pills by mouth, suggestive that it may have been syphilis that she had but he does not remember if she did tell him the exact infection.  He has not had sex with anyone else recently.  He was last tested in May and everything was negative at that time.  His last sexual contact with this lady was about 1 month ago.  He is continued to have a little burning and discomfort sensation in his penis and was quite concerned about that.  There has been no discharge or no sores.  Current allergies, medications, problem list, past/family and social histories reviewed.  Objective:  BP (!) 152/113 (BP Location: Left Arm, Patient Position: Sitting, Cuff Size: Large)   Pulse 91   Temp 97.6 F (36.4 C) (Temporal)   Ht 5\' 9"  (1.753 m)   Wt 296 lb 9.6 oz (134.5 kg)   SpO2 95%   BMI 43.80 kg/m  148/108 Normal male external genitalia with testes descended.  No dermatitis or warts.  No hernias. Assessment & Plan:   Assessment: 1. Possible exposure to STD   2. Essential hypertension   3. Overweight       Plan: Check a urine dip and STD testing.  We will try and let them know these in a few days.  For now reassurance.  Orders Placed This Encounter  Procedures  . RPR  . HIV antibody (with reflex)  . Hepatitis C antibody  . HSV(herpes simplex vrs) 1+2 ab-IgG  . POCT urinalysis dipstick    No orders of the defined types were placed in this encounter.        Patient Instructions    In the event that you do not hear from someone regarding  these tests by the middle of next week call back over here and ask for the results since I will be out of town.  You are being tested for syphilis, herpes, gonorrhea, HIV, and chlamydia.  I added the herpes test because it can sometimes cause the burning sensation when you pee.  If you choose to be sexually involved, you should use condoms 100% of the time.  No sexual activity at all until you have the results of these tests.  Your blood pressure continues to run too high.  When I repeated that I got 148/108.  Our goal is to have it consistently below 140/90, and then ideal blood pressure is really down like 120/70.  I urged you to continue cutting back on your eating until that weight continues to come down.  Increase the carvedilol from 6.25 to 12.5 mg twice daily.  You have plenty of the 6.25 mg tablets, so would like you to double up on them and take 2 in the morning and 2 in the evening until you follow-up with Dr. Carlota Raspberry.  Return as needed   If you have lab work done today you will be contacted with your lab results within the  next 2 weeks.  If you have not heard from [place] then please contact us. The fastest way to get your results is to register for My Chart.   IF you received an x-ray today, you will receive an invoice from [facility]. Please contact [facility] at [phone] with questions or concerns regarding your invoice.   IF you received labwork today, you will receive an invoice from [facility]. Please contact [facility] at [phone] with questions or concerns regarding your invoice.   Our billing staff will not be able to assist you with questions regarding bills from these companies.  You will be contacted with the lab results as soon as they are available. The fastest way to get your results is to activate your My Chart account. Instructions are located on the last page of this paperwork. If you have not heard from [place] regarding the results in 2 weeks, please  contact this office.        Return if symptoms worsen or fail to improve.   Ruben Reason, MD 07/13/2020

## 2020-07-14 LAB — HSV(HERPES SIMPLEX VRS) I + II AB-IGG
HSV 1 Glycoprotein G Ab, IgG: 37.5 index — ABNORMAL HIGH (ref 0.00–0.90)
HSV 2 IgG, Type Spec: 8.89 index — ABNORMAL HIGH (ref 0.00–0.90)

## 2020-07-14 LAB — HIV ANTIBODY (ROUTINE TESTING W REFLEX): HIV Screen 4th Generation wRfx: NONREACTIVE

## 2020-07-14 LAB — HEPATITIS C ANTIBODY: Hep C Virus Ab: <0.1 s/co ratio (ref 0.0–0.9)

## 2020-07-14 LAB — RPR: RPR Ser Ql: NONREACTIVE

## 2020-07-16 LAB — GC/CHLAMYDIA PROBE AMP (~~LOC~~) NOT AT ARMC
Chlamydia: NEGATIVE
Comment: NEGATIVE
Comment: NORMAL
Neisseria Gonorrhea: NEGATIVE

## 2020-07-18 ENCOUNTER — Telehealth: Payer: Self-pay

## 2020-07-18 ENCOUNTER — Encounter: Payer: Self-pay | Admitting: Neurology

## 2020-07-18 NOTE — Telephone Encounter (Addendum)
Dr Dohmeier does not provide scripts to the [facility]. Pt is on a bipap machine and has not made any recommendation to indicate that a dental device would be a treatment for the patient. Pt will need to schedule a follow up visit to discuss further on whether dental device is something that could be completed for him. I will send the patient a mychart message advising this information as well.

## 2020-07-18 NOTE — Telephone Encounter (Signed)
[facility] is calling back regarding the requested they have sent out regarding the oral sleep appliance. They would like a call back regarding this request. [phone] Option 2  Please advise.

## 2020-07-18 NOTE — Telephone Encounter (Signed)
Marie from [facility]) is calling to request a prescription from Dr. Brett Fairy on behalf of the patient. Pt is going to them for an oral appliance. Please advice.

## 2020-07-18 NOTE — Telephone Encounter (Signed)
I called [facility] at [phone] opt 2 Spoke to Jeneen Rinks he was informed patient has an upcoming appt on 07/20/20 in reference to this sleep study and we will reach out to the after this date.

## 2020-07-19 ENCOUNTER — Telehealth: Payer: Self-pay | Admitting: Family Medicine

## 2020-07-19 NOTE — Telephone Encounter (Signed)
Calling wanting most recent STD testing results.

## 2020-07-19 NOTE — Telephone Encounter (Signed)
Pt requesting STD results does have appt tomorrow with you

## 2020-07-19 NOTE — Telephone Encounter (Signed)
Error

## 2020-07-20 ENCOUNTER — Encounter: Payer: Self-pay | Admitting: Family Medicine

## 2020-07-20 ENCOUNTER — Ambulatory Visit (INDEPENDENT_AMBULATORY_CARE_PROVIDER_SITE_OTHER): Payer: Medicare Other | Admitting: Family Medicine

## 2020-07-20 ENCOUNTER — Other Ambulatory Visit: Payer: Self-pay

## 2020-07-20 VITALS — BP 142/88 | HR 88 | Temp 97.5°F | Ht 69.0 in | Wt 295.0 lb

## 2020-07-20 DIAGNOSIS — G4733 Obstructive sleep apnea (adult) (pediatric): Secondary | ICD-10-CM

## 2020-07-20 DIAGNOSIS — F321 Major depressive disorder, single episode, moderate: Secondary | ICD-10-CM

## 2020-07-20 DIAGNOSIS — I5030 Unspecified diastolic (congestive) heart failure: Secondary | ICD-10-CM | POA: Diagnosis not present

## 2020-07-20 DIAGNOSIS — Z202 Contact with and (suspected) exposure to infections with a predominantly sexual mode of transmission: Secondary | ICD-10-CM | POA: Diagnosis not present

## 2020-07-20 DIAGNOSIS — Z6841 Body Mass Index (BMI) 40.0 and over, adult: Secondary | ICD-10-CM

## 2020-07-20 DIAGNOSIS — E876 Hypokalemia: Secondary | ICD-10-CM

## 2020-07-20 DIAGNOSIS — I10 Essential (primary) hypertension: Secondary | ICD-10-CM | POA: Diagnosis not present

## 2020-07-20 LAB — BASIC METABOLIC PANEL
BUN/Creatinine Ratio: 16 (ref 9–20)
BUN: 17 mg/dL (ref 6–24)
CO2: 25 mmol/L (ref 20–29)
Calcium: 9.5 mg/dL (ref 8.7–10.2)
Chloride: 104 mmol/L (ref 96–106)
Creatinine, Ser: 1.04 mg/dL (ref 0.76–1.27)
GFR calc Af Amer: 96 mL/min/{1.73_m2} (ref >59)
GFR calc non Af Amer: 83 mL/min/{1.73_m2} (ref >59)
Glucose: 90 mg/dL (ref 65–99)
Potassium: 3.7 mmol/L (ref 3.5–5.2)
Sodium: 142 mmol/L (ref 134–144)

## 2020-07-20 MED ORDER — CARVEDILOL 12.5 MG PO TABS: 12.5000 mg | ORAL_TABLET | Freq: Two times a day (BID) | ORAL | 3 refills | Status: DC

## 2020-07-20 NOTE — Progress Notes (Signed)
Subjective:  Patient ID: Michael [name], male    DOB: October 06, 1971  Age: 49 y.o. MRN: [id number]  CC:  Chief Complaint  Patient presents with  . Follow-up    on hypertension and lab results. Pt reports he had a high BP last friday, but after rechecking it the BP seemed to go back to a normal level. Pt states he dosen't check his BP at home due to life being hecktic. Pt reports no physical symptoms on hypertension. Pt would also like to know about his lab results from last visit.  . Follow-up     Major depressivedisorder, single episode, mod ICD-10 code: F12, Morbid obesity due to excess calories ICD-10 code: B3419, and Heart failure, unspecified ICD-10 code: I509.  . 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 Pt states he spoke with his sleep specilist and they informed him they would reach out to the provider.    HPI Michael [name] presents for   Hypertension: Complicated by CHF, obesity.  Obstructive sleep apnea. See previous visits.  Discussed timing and consistency of medications previously.  Additionally discussed consistency with use of BiPAP with underlying sleep apnea.  Did receive paperwork regarding dental device, but advised that he discuss this with his sleep specialist given his prior level of sleep apnea. Followed by cardiology previously, history of CHF, diastolic.  Planned cardiology follow-up, referred in March.  Has not yet seen.  Echocardiogram in May 2018, moderate LVH from hypertension with normal LVF At May 21 visit, blood pressure was closer to normal at 139/95.  He was taking carvedilol and potassium in the morning, lisinopril, amlodipine, chlorthalidone at 11 AM on workdays. We did increase his chlorthalidone to a higher dose for improved control at 50 mg daily. Taking all meds in the morning now - no missed doses, and then second dose carvedilol at night. Denies missed doses.  No chest pain, or shortness of breath with activity. No new leg swelling.  Home  readings: none.  Has been using Bipap. Wears for about 5 hours.  BP Readings from Last 3 Encounters:  07/20/20 (!) 142/88  07/13/20 (!) 152/113  06/28/20 (!) 148/104   Wt Readings from Last 3 Encounters:  07/20/20 (!) 295 lb (133.8 kg)  07/13/20 296 lb 9.6 oz (134.5 kg)  06/28/20 (!) 305 lb (138.3 kg)  exercise: total gym on weekends. Exercise at work - pushing carts, stocking.  Drinking some light sodas at times, some juice. No fast food.  Weight down from 7/1.    Possible exposure to STD Evaluated by Dr. Linna Darner last week. Not sure of specifics, but prior partner with some STD?  HSV antibodies positive but other STI screen reassuring/negative.  No prior blisters/gential rash or known hx of HSV. No recent symptoms, some prior tingling.   Results for orders placed or performed in visit on 37/90/24  Basic metabolic panel  Result Value Ref Range   Glucose 90 65 - 99 mg/dL   BUN 17 6 - 24 mg/dL   Creatinine, Ser 1.04 0.76 - 1.27 mg/dL   GFR calc non Af Amer 83 >59 mL/min/1.73   GFR calc Af Amer 96 >59 mL/min/1.73   BUN/Creatinine Ratio 16 9 - 20   Sodium 142 134 - 144 mmol/L   Potassium 3.7 3.5 - 5.2 mmol/L   Chloride 104 96 - 106 mmol/L   CO2 25 20 - 29 mmol/L   Calcium 9.5 8.7 - 10.2 mg/dL   Depression: History of major  depressive disorder, discussed at his May visit.  Trazodone has been discontinued due to fatigue, was sleeping okay at that time.  He was continued on Wellbutrin 150 mg once per day. Doing well. No new side effects.  Not on trazodone.   Depression screen [facility] 2/9 07/20/2020 07/13/2020 05/18/2020 04/18/2020 04/11/2020  Decreased Interest 0 0 0 1 0  Down, Depressed, Hopeless 0 0 0 2 0  PHQ - 2 Score 0 0 0 3 0  Altered sleeping - 0 0 3 -  Tired, decreased energy - 0 0 3 -  Change in appetite - 0 0 2 -  Feeling bad or failure about yourself  - 0 0 2 -  Trouble concentrating - 0 0 2 -  Moving slowly or fidgety/restless - 0 0 2 -  Suicidal thoughts - 0 0 0 -  PHQ-9  Score - 0 0 17 -  Difficult doing work/chores - Not difficult at all Not difficult at all Not difficult at all -     Hypokalemia Potassium stable on May 27 visit.  Treated with potassium chloride 10 mEq daily. No new cramps.  Lab Results  Component Value Date   CREATININE 1.04 07/20/2020      History Patient Active Problem List   Diagnosis Date Noted  . Morbid obesity with body mass index (BMI) of 40.0 to 44.9 in adult [facility]) 09/21/2019  . CHF (congestive heart failure), NYHA class I, acute, diastolic ([place]) 78/24/2353  . Central sleep apnea comorbid with prescribed opioid use 09/21/2019  . History of uncontrolled hypertension 09/21/2019  . Complex sleep apnea syndrome 08/18/2019  . Claustrophobia 08/18/2019  . Anxiety associated with depression 08/18/2019  . Hypokalemia 04/04/2017  . Morbid obesity ([place]) 04/04/2017  . Chest wall pain 04/04/2017  . Asthma 02/13/2016  . Non compliance w medication regimen 06/13/2015  . OSA (obstructive sleep apnea) 04/06/2013  . Abnormal EKG 11/08/2012  . Essential hypertension   . HTN (hypertension), malignant 04/29/2012  . Migraine headache with aura 04/29/2012   Past Medical History:  Diagnosis Date  . Anxiety   . Depression    Questionable per records. Not on any medication.   Marland Kitchen History of kidney stones   . Hx of echocardiogram    a. Echo 12/13/12: Mild LVH, EF 61-44%, grade 1 diastolic dysfunction, mild LAE, mild RVE, mild RAE  . Hypertension   . Insomnia   . Migraine   . Noncompliance   . OSA (obstructive sleep apnea)    a. Sleep Study 02/2013:  mod OSA, AHI 33 per hour, O2 sat nadir 85%   Past Surgical History:  Procedure Laterality Date  . Admission  04/2012   Malignant HTN, HA.  CT head negative, cardiology consult.    . CYSTOSCOPY WITH RETROGRADE PYELOGRAM, URETEROSCOPY AND STENT PLACEMENT Left 01/24/2019   Procedure: CYSTOSCOPY WITH RETROGRADE PYELOGRAM, URETEROSCOPY AND STENT PLACEMENT;  Surgeon: Cleon Gustin, MD;   Location[facility];  Service: Urology;  Laterality: Left;  . HOLMIUM LASER APPLICATION Left 03/13/4007   Procedure: HOLMIUM LASER APPLICATION;  Surgeon: Cleon Gustin, MD;  Location: [facility];  Service: Urology;  Laterality: Left;  Marland Kitchen MANDIBLE SURGERY  1998   metal plate   Allergies  Allergen Reactions  . Statins Other (See Comments)    unknown   Prior to Admission medications   Medication Sig Start Date End Date Taking? Authorizing Provider  amLODipine (NORVASC) 10 MG tablet Take 1 tablet by mouth every day 06/21/20  Yes [name], MD  buPROPion (WELLBUTRIN XL) 150 MG 24 hr tablet Take 1 tablet (150 mg total) by mouth daily. 06/28/20  Yes [name], MD  carvedilol (COREG) 6.25 MG tablet Take 1 tablet by mouth twice daily 06/21/20  Yes [name], MD  chlorthalidone (HYGROTON) 25 MG tablet Take 1 tablet by mouth every day 06/21/20  Yes [name], MD  chlorthalidone (HYGROTON) 50 MG tablet Take 1 tablet (50 mg total) by mouth daily. 05/18/20  Yes [name], MD  lisinopril (ZESTRIL) 40 MG tablet TAKE 1 TABLET(40 MG) BY MOUTH DAILY 05/31/20  Yes [name], MD  potassium chloride (KLOR-CON) 10 MEQ tablet Take 1 tablet by mouth every day 07/11/20  Yes [name], MD   Social History   Socioeconomic History  . Marital status: Divorced    Spouse name: Not on file  . Number of children: 4  . Years of education: Not on file  . Highest education level: Not on file  Occupational History  . Occupation: Unemployed//disabled  Tobacco Use  . Smoking status: Never Smoker  . Smokeless tobacco: Never Used  Substance and Sexual Activity  . Alcohol use: No    Alcohol/week: 0.0 standard drinks  . Drug use: No  . Sexual activity: Yes    Comment: per pt's blue health form - 1 sex partner in last 12 months  Other Topics Concern  . Not on file  Social History Narrative   Marital status: single; living with fiance        Children: 4 children, no grandchildren.      Employment:  Disability for learning disabilities since 2012.   Right-handed   Caffeine: occasional         Social Determinants of Radio broadcast assistant Strain:   . Difficulty of Paying Living Expenses:   Food Insecurity:   . Worried About Charity fundraiser in the Last Year:   . Arboriculturist in the Last Year:   Transportation Needs:   . Film/video editor (Medical):   Marland Kitchen Lack of Transportation (Non-Medical):   Physical Activity:   . Days of Exercise per Week:   . Minutes of Exercise per Session:   Stress:   . Feeling of Stress :   Social Connections:   . Frequency of Communication with Friends and Family:   . Frequency of Social Gatherings with Friends and Family:   . Attends Religious Services:   . Active Member of Clubs or Organizations:   . Attends Archivist Meetings:   Marland Kitchen Marital Status:   Intimate Partner Violence:   . Fear of Current or Ex-Partner:   . Emotionally Abused:   Marland Kitchen Physically Abused:   . Sexually Abused:     Review of Systems  Constitutional: Negative for fatigue and unexpected weight change.  Eyes: Negative for visual disturbance.  Respiratory: Negative for cough, chest tightness and shortness of breath.   Cardiovascular: Negative for chest pain, palpitations and leg swelling.  Gastrointestinal: Negative for abdominal pain and blood in stool.  Neurological: Negative for dizziness, light-headedness and headaches.  Psychiatric/Behavioral: Negative for dysphoric mood.     Objective:   Vitals:   07/20/20 0850 07/20/20 0951  BP: (!) 149/105 (!) 142/88  Pulse: 88   Temp: (!) 97.5 F (36.4 C)   TempSrc: Temporal   SpO2: 95%   Weight: (!) 295 lb (133.8 kg)   Height: 5\' 9"  (1.753 m)  Physical Exam Vitals reviewed.  Constitutional:      Appearance: He is well-developed. He is obese.  HENT:     Head: Normocephalic and atraumatic.  Eyes:     Pupils: Pupils are  equal, round, and reactive to light.  Neck:     Vascular: No carotid bruit or JVD.  Cardiovascular:     Rate and Rhythm: Normal rate and regular rhythm.     Heart sounds: Normal heart sounds. No murmur heard.   Pulmonary:     Effort: Pulmonary effort is normal.     Breath sounds: Normal breath sounds. No rales.  Skin:    General: Skin is warm and dry.  Neurological:     Mental Status: He is alert and oriented to person, place, and time.        Assessment & Plan:  Jaylon Perezperez is a 49 y.o. male . Possible exposure to STD  -Results reviewed, discussed HSV antibody results and potential interpretation.  RTC precautions.  Safer sex practices discussed.  Essential hypertension - Plan: DISCONTINUED: carvedilol (COREG) 12.5 MG tablet  -Borderline, home monitoring discussed.  RTC precautions.  OSA treated with BiPAP  -Compliance with CPAP discussed.  regarding paperwork for dental device recommended discussing with his sleep specialist.  Hypokalemia - Plan: Basic metabolic panel  -History of hyperkalemia, repeat BMP  Diastolic heart failure, unspecified HF chronicity ([place])  -History, appears to be euvolemic  Moderate major depression ([place])  -Stable on Wellbutrin, doing well off trazodone.  No changes for now  Morbid obesity with BMI of 40.0-44.9, adult ([place])  -Diet/activity for weight management.  Amount/intensity of exercise can be discussed with cardiology, phone number provided.  Previously referred.  Meds ordered this encounter  Medications  . DISCONTD: carvedilol (COREG) 12.5 MG tablet    Sig: Take 1 tablet (12.5 mg total) by mouth 2 (two) times daily with a meal.    Dispense:  60 tablet    Refill:  3   Patient Instructions   Recent sexual transmitted infection testing overall looked okay. You did have antibodies for the herpes virus both for the cold sore type and as well as the genital  Herpes.  If you have any new genital rash or blisters, be seen right away as that  can be a sign of genital herpes.  If you have any new symptoms please follow-up to discuss other testing. I do recommend condom at al times.   Wear Bipap nightly - try to leave in place all night to see if that helps blood pressure. Discuss dental device with your sleep specialist, but I do recommend Bipap use primarily.   Keep a record of your blood pressures for the next visit in 1 month.  It is very important that you follow-up with cardiology.  Please call their office for an appointment.  I  referred you in March, so that referral should still be effective.  Try to cut out sodas, and other sugar containing beverages to help with weight. Water is best. Keep up the good work.    Cardiology referral was sent to Sueanne Margarita, MD office back in march of this year. The number for the office is [phone].   If you have lab work done today you will be contacted with your lab results within the next 2 weeks.  If you have not heard from [place] then please contact us. The fastest way to get your results is to register for My Chart.   IF you received  an x-ray today, you will receive an invoice from [facility]. Please contact [facility] at [phone] with questions or concerns regarding your invoice.   IF you received labwork today, you will receive an invoice from [place]. Please contact [facility] at [phone] with questions or concerns regarding your invoice.   Our billing staff will not be able to assist you with questions regarding bills from these companies.  You will be contacted with the lab results as soon as they are available. The fastest way to get your results is to activate your My Chart account. Instructions are located on the last page of this paperwork. If you have not heard from [place] regarding the results in 2 weeks, please contact this office.         Signed, [name], MD [facility]

## 2020-07-20 NOTE — Telephone Encounter (Signed)
Reviewed in office today

## 2020-07-20 NOTE — Patient Instructions (Addendum)
Recent sexual transmitted infection testing overall looked okay. You did have antibodies for the herpes virus both for the cold sore type and as well as the genital  Herpes.  If you have any new genital rash or blisters, be seen right away as that can be a sign of genital herpes.  If you have any new symptoms please follow-up to discuss other testing. I do recommend condom at al times.   Wear Bipap nightly - try to leave in place all night to see if that helps blood pressure. Discuss dental device with your sleep specialist, but I do recommend Bipap use primarily.   Keep a record of your blood pressures for the next visit in 1 month.  It is very important that you follow-up with cardiology.  Please call their office for an appointment.  I  referred you in March, so that referral should still be effective.  Try to cut out sodas, and other sugar containing beverages to help with weight. Water is best. Keep up the good work.    Cardiology referral was sent to Sueanne Margarita, MD office back in march of this year. The number for the office is [phone].   If you have lab work done today you will be contacted with your lab results within the next 2 weeks.  If you have not heard from [place] then please contact us. The fastest way to get your results is to register for My Chart.   IF you received an x-ray today, you will receive an invoice from [facility]. Please contact [facility] at [phone] with questions or concerns regarding your invoice.   IF you received labwork today, you will receive an invoice from [place]. Please contact [facility] at [phone] with questions or concerns regarding your invoice.   Our billing staff will not be able to assist you with questions regarding bills from these companies.  You will be contacted with the lab results as soon as they are available. The fastest way to get your results is to activate your My Chart account. Instructions are  located on the last page of this paperwork. If you have not heard from [place] regarding the results in 2 weeks, please contact this office.

## 2020-07-21 ENCOUNTER — Other Ambulatory Visit: Payer: Self-pay | Admitting: Family Medicine

## 2020-07-21 DIAGNOSIS — I10 Essential (primary) hypertension: Secondary | ICD-10-CM

## 2020-07-21 NOTE — Telephone Encounter (Signed)
Requested Prescriptions  Pending Prescriptions Disp Refills   lisinopril (ZESTRIL) 40 MG tablet [Pharmacy Med Name: LISINOPRIL 40MG  TABLETS] 90 tablet 0    Sig: TAKE 1 TABLET(40 MG) BY MOUTH DAILY     There is no refill protocol information for this order

## 2020-07-23 ENCOUNTER — Telehealth: Payer: Self-pay | Admitting: Neurology

## 2020-07-23 NOTE — Telephone Encounter (Signed)
Pt called again asking for prescription for a dental device be signed by Dr. Brett Fairy and faxed back to [facility]. I informed the pt that a my chart message had been sent to him on 07/18/20 by Myriam Jacobson Dr. Dohmeier's nurse. I informed the pt of the following message: Hey Michael [name], I have received a fax and call from [place] for Dr Brett Fairy to sign off on. Dr. Brett Fairy does not recommend dental device for you as far as treatment for your apnea. If you are struggling with your BiPAP and wanting to discuss if there are alternative treatment options, I recommend scheduling a follow up visit to discuss. The pt verbalized understanding. I scheduled the pt for a follow up visit with Dr. Brett Fairy for 09/13/20 @ 9:30 am. I informed the pt to bring his Bipap machine. Pt agreed to this.

## 2020-07-24 ENCOUNTER — Encounter: Payer: Self-pay | Admitting: Family Medicine

## 2020-08-09 ENCOUNTER — Other Ambulatory Visit: Payer: Self-pay

## 2020-08-09 ENCOUNTER — Ambulatory Visit (HOSPITAL_COMMUNITY)
Admission: EM | Admit: 2020-08-09 | Discharge: 2020-08-09 | Disposition: A | Discharge status: Home / Self Care | Payer: Medicare Other | Attending: Family Medicine | Admitting: Family Medicine

## 2020-08-09 ENCOUNTER — Encounter (HOSPITAL_COMMUNITY): Payer: Self-pay

## 2020-08-09 DIAGNOSIS — G43009 Migraine without aura, not intractable, without status migrainosus: Secondary | ICD-10-CM | POA: Diagnosis not present

## 2020-08-09 MED ORDER — METOCLOPRAMIDE HCL 5 MG/ML IJ SOLN
INTRAMUSCULAR | Status: AC
  Filled 2020-08-09: qty 2

## 2020-08-09 MED ORDER — KETOROLAC TROMETHAMINE 30 MG/ML IJ SOLN
30.0000 mg | Freq: Once | INTRAMUSCULAR | Status: AC
  Administered 2020-08-09: 30 mg via INTRAMUSCULAR

## 2020-08-09 MED ORDER — DEXAMETHASONE SODIUM PHOSPHATE 10 MG/ML IJ SOLN
INTRAMUSCULAR | Status: AC
  Filled 2020-08-09: qty 1

## 2020-08-09 MED ORDER — DEXAMETHASONE SODIUM PHOSPHATE 10 MG/ML IJ SOLN
10.0000 mg | Freq: Once | INTRAMUSCULAR | Status: AC
  Administered 2020-08-09: 10 mg via INTRAMUSCULAR

## 2020-08-09 MED ORDER — NAPROXEN 500 MG PO TABS
500.0000 mg | ORAL_TABLET | Freq: Two times a day (BID) | ORAL | 0 refills | Status: DC
Start: 2020-08-09 — End: 2020-10-24

## 2020-08-09 MED ORDER — METOCLOPRAMIDE HCL 5 MG/ML IJ SOLN
5.0000 mg | Freq: Once | INTRAMUSCULAR | Status: AC
  Administered 2020-08-09: 5 mg via INTRAMUSCULAR

## 2020-08-09 MED ORDER — KETOROLAC TROMETHAMINE 30 MG/ML IJ SOLN
INTRAMUSCULAR | Status: AC
  Filled 2020-08-09: qty 1

## 2020-08-09 NOTE — ED Triage Notes (Signed)
Pt presents with headache X 3 days with some slight nausea.

## 2020-08-09 NOTE — Progress Notes (Signed)
Call patient:Michael recent STD testing done was normal with the exception of testing positive for herpes.  Michael blood work shows evidence of having had herpes and sometimes in the past, both strains, the one that causes fever blisters and the one that can cause genital sores.  If you have any blisters or sores on Michael penis you should come get treated, otherwise you just need to know that you have had a virus in the past and may be a carrier of it and can expose other people to it so it is very important that you always use condoms.  There is no evidence of any of the other STDs.I apologize on being a little late on getting back to you on this because [place] have been [place].Fenton Malling. Linna Darner, MD

## 2020-08-09 NOTE — ED Provider Notes (Signed)
[place]    CSN: [id number] Arrival date & time: 08/09/20  1205      History   Chief Complaint Chief Complaint  Patient presents with  . Headache    HPI Michael [name] is a 49 y.o. male history of hypertension, OSA, migraines, presenting today for evaluation of headache.  Patient reports over the past 3 days he has had headache which is located in his left frontal area, wraps around his left ear.  Has had associated nausea which has improved since initial onset.  Denies vomiting.  Has associated photophobia.  Denies vision changes.  Does report this feels similar to prior migraines.  He has felt very fatigued and generalized weakness.  Denies any confusion, difficulty speaking or one-sided weakness.  Denies associated URI symptoms.  Denies fevers or neck stiffness.  Took Tylenol which did help with symptoms temporarily, but symptoms returned after wearing off.  HPI  Past Medical History:  Diagnosis Date  . Anxiety   . Depression    Questionable per records. Not on any medication.   Marland Kitchen History of kidney stones   . Hx of echocardiogram    a. Echo 12/13/12: Mild LVH, EF 50-27%, grade 1 diastolic dysfunction, mild LAE, mild RVE, mild RAE  . Hypertension   . Insomnia   . Migraine   . Noncompliance   . OSA (obstructive sleep apnea)    a. Sleep Study 02/2013:  mod OSA, AHI 33 per hour, O2 sat nadir 85%    Patient Active Problem List   Diagnosis Date Noted  . Morbid obesity with body mass index (BMI) of 40.0 to 44.9 in adult [facility]) 09/21/2019  . CHF (congestive heart failure), NYHA class I, acute, diastolic ([place]) 74/11/8785  . Central sleep apnea comorbid with prescribed opioid use 09/21/2019  . History of uncontrolled hypertension 09/21/2019  . Complex sleep apnea syndrome 08/18/2019  . Claustrophobia 08/18/2019  . Anxiety associated with depression 08/18/2019  . Hypokalemia 04/04/2017  . Morbid obesity ([place]) 04/04/2017  . Chest wall pain 04/04/2017  . Asthma  02/13/2016  . Non compliance w medication regimen 06/13/2015  . OSA (obstructive sleep apnea) 04/06/2013  . Abnormal EKG 11/08/2012  . Essential hypertension   . HTN (hypertension), malignant 04/29/2012  . Migraine headache with aura 04/29/2012    Past Surgical History:  Procedure Laterality Date  . Admission  04/2012   Malignant HTN, HA.  CT head negative, cardiology consult.    . CYSTOSCOPY WITH RETROGRADE PYELOGRAM, URETEROSCOPY AND STENT PLACEMENT Left 01/24/2019   Procedure: CYSTOSCOPY WITH RETROGRADE PYELOGRAM, URETEROSCOPY AND STENT PLACEMENT;  Surgeon: Cleon Gustin, MD;  Location: [facility];  Service: Urology;  Laterality: Left;  . HOLMIUM LASER APPLICATION Left 7/67/2094   Procedure: HOLMIUM LASER APPLICATION;  Surgeon: Cleon Gustin, MD;  Location: [facility];  Service: Urology;  Laterality: Left;  Marland Kitchen MANDIBLE SURGERY  1998   metal plate       Home Medications    Prior to Admission medications   Medication Sig Start Date End Date Taking? Authorizing Provider  amLODipine (NORVASC) 10 MG tablet Take 1 tablet by mouth every day 06/21/20   [name], MD  buPROPion (WELLBUTRIN XL) 150 MG 24 hr tablet Take 1 tablet (150 mg total) by mouth daily. 06/28/20   [name], MD  chlorthalidone (HYGROTON) 50 MG tablet Take 1 tablet (50 mg total) by mouth daily. 05/18/20   [name], MD  lisinopril (ZESTRIL) 40 MG  tablet TAKE 1 TABLET(40 MG) BY MOUTH DAILY 07/21/20   [name], MD  naproxen (NAPROSYN) 500 MG tablet Take 1 tablet (500 mg total) by mouth 2 (two) times daily. 08/09/20   Eeshan [place] C, PA-C  potassium chloride (KLOR-CON) 10 MEQ tablet Take 1 tablet by mouth every day 07/11/20   [name], MD    Family History Family History  Problem Relation Age of Onset  . Cancer Father        prostate, colon- age was in his 46's  . Heart disease Father 32       MI   . Hypertension Father   . Colon  cancer Father   . Diabetes Mother   . Hypertension Mother   . Hypertension Brother   . Cancer Maternal Grandfather   . Cancer Paternal Grandfather   . Esophageal cancer Neg Hx   . Stomach cancer Neg Hx   . Rectal cancer Neg Hx     Social History Social History   Tobacco Use  . Smoking status: Never Smoker  . Smokeless tobacco: Never Used  Substance Use Topics  . Alcohol use: No    Alcohol/week: 0.0 standard drinks  . Drug use: No     Allergies   Statins   Review of Systems Review of Systems  Constitutional: Negative for fatigue and fever.  HENT: Negative for congestion, sinus pressure and sore throat.   Eyes: Positive for photophobia. Negative for pain and visual disturbance.  Respiratory: Negative for cough and shortness of breath.   Cardiovascular: Negative for chest pain.  Gastrointestinal: Positive for nausea. Negative for abdominal pain and vomiting.  Genitourinary: Negative for decreased urine volume and hematuria.  Musculoskeletal: Negative for myalgias, neck pain and neck stiffness.  Neurological: Positive for headaches. Negative for dizziness, syncope, facial asymmetry, speech difficulty, weakness, light-headedness and numbness.     Physical Exam Triage Vital Signs ED Triage Vitals  Enc Vitals Group     BP 08/09/20 1320 (!) 150/125     Pulse Rate 08/09/20 1320 76     Resp 08/09/20 1320 18     Temp 08/09/20 1320 97.9 F (36.6 C)     Temp Source 08/09/20 1320 Oral     SpO2 08/09/20 1320 98 %     Weight --      Height --      Head Circumference --      Peak Flow --      Pain Score 08/09/20 1319 7     Pain Loc --      Pain Edu? --      Excl. in [place]? --    No data found.  Updated Vital Signs BP (!) 150/125 (BP Location: Right Arm)   Pulse 76   Temp 97.9 F (36.6 C) (Oral)   Resp 18   SpO2 98%   Visual Acuity Right Eye Distance:   Left Eye Distance:   Bilateral Distance:    Right Eye Near:   Left Eye Near:    Bilateral Near:      Physical Exam Vitals and nursing note reviewed.  Constitutional:      Appearance: He is well-developed.     Comments: Patient appears very tired, frequently closing eyes, but easily responsive  HENT:     Head: Normocephalic and atraumatic.     Ears:     Comments: Bilateral ears without tenderness to palpation of external auricle, tragus and mastoid, EAC's without erythema or swelling, TM's with good bony  landmarks and cone of light. Non erythematous.     Nose: Nose normal.     Mouth/Throat:     Comments: Oral mucosa pink and moist, no tonsillar enlargement or exudate. Posterior pharynx patent and nonerythematous, no uvula deviation or swelling. Normal phonation. Eyes:     Extraocular Movements: Extraocular movements intact.     Conjunctiva/sclera: Conjunctivae normal.     Pupils: Pupils are equal, round, and reactive to light.  Cardiovascular:     Rate and Rhythm: Normal rate.  Pulmonary:     Effort: Pulmonary effort is normal. No respiratory distress.     Comments: Breathing comfortably at rest, CTABL, no wheezing, rales or other adventitious sounds auscultated Abdominal:     General: There is no distension.  Musculoskeletal:        General: Normal range of motion.     Cervical back: Neck supple.  Skin:    General: Skin is warm and dry.  Neurological:     Mental Status: He is alert and oriented to person, place, and time.     Comments: Patient A&O x3, cranial nerves II-XII grossly intact, strength at shoulders, hips and knees 5/5, equal bilaterally, patellar reflex 2+ bilaterally. Negative Romberg and Pronator Drift. Gait without abnormality.      UC Treatments / Results  Labs (all labs ordered are listed, but only abnormal results are displayed) Labs Reviewed - No data to display  EKG   Radiology No results found.  Procedures Procedures (including critical care time)  Medications Ordered in UC Medications  ketorolac (TORADOL) 30 MG/ML injection 30 mg (has no  administration in time range)  metoCLOPramide (REGLAN) injection 5 mg (has no administration in time range)  dexamethasone (DECADRON) injection 10 mg (has no administration in time range)    Initial Impression / Assessment and Plan / UC Course  I have reviewed the triage vital signs and the nursing notes.  Pertinent labs & imaging results that were available during my care of the patient were reviewed by me and considered in my medical decision making (see chart for details).  Clinical Course as of Aug 09 1434  Thu Aug 09, 2020  1424 158/108   [HW]    Clinical Course User Index [HW] Zyaira Vejar, [place] C, PA-C    3-day headache without worsening symptoms with associated photophobia, similar to prior migraines.  No neuro deficits on exam, patient does appear very tired, but is alert and oriented and responsive when stimulated.  Discussed concern with this and unable to rule out underlying intracranial abnormality without imaging.  Opting to go ahead and proceed with migraine cocktail with Toradol Decadron and Reglan, will send home with Naprosyn to use as needed.  Patient to follow-up in emergency room if not seeing any improvement with migraine cocktail or symptoms worsening.  Patient verbalized understanding,Discussed strict return precautions. Patient verbalized understanding and is agreeable with plan.  Final Clinical Impressions(s) / UC Diagnoses   Final diagnoses:  Migraine without aura and without status migrainosus, not intractable     Discharge Instructions     We gave you an injection of Toradol, Decadron and Reglan today for headache.  They should begin working in 30 to 40 minutes. Use Naprosyn twice daily as needed for further headache management at home If any symptoms not improving or worsening, developing increased headache, developing vision changes, difficulty speaking, confusion, increased weakness especially on one side please follow-up with emergency room  immediately    ED Prescriptions    Medication Sig  Dispense Auth. Provider   naproxen (NAPROSYN) 500 MG tablet Take 1 tablet (500 mg total) by mouth 2 (two) times daily. 30 tablet Pricila [place], [place] C, PA-C     PDMP not reviewed this encounter.   Janith Lima, PA-C 08/09/20 1435

## 2020-08-09 NOTE — ED Notes (Signed)
Patient has headache, nausea (slight) and feels tired.  Denies sore throat or fever.  Patient has taken tylenol for symptoms with minimal relief

## 2020-08-09 NOTE — Discharge Instructions (Signed)
We gave you an injection of Toradol, Decadron and Reglan today for headache.  They should begin working in 30 to 40 minutes. Use Naprosyn twice daily as needed for further headache management at home If any symptoms not improving or worsening, developing increased headache, developing vision changes, difficulty speaking, confusion, increased weakness especially on one side please follow-up with emergency room immediately

## 2020-08-24 ENCOUNTER — Ambulatory Visit: Payer: Medicare Other | Admitting: Family Medicine

## 2020-09-10 DIAGNOSIS — G4733 Obstructive sleep apnea (adult) (pediatric): Secondary | ICD-10-CM | POA: Diagnosis not present

## 2020-09-13 ENCOUNTER — Ambulatory Visit: Payer: Medicare Other | Admitting: Neurology

## 2020-09-16 ENCOUNTER — Other Ambulatory Visit: Payer: Self-pay | Admitting: Family Medicine

## 2020-09-16 NOTE — Telephone Encounter (Signed)
Requested Prescriptions  Pending Prescriptions Disp Refills  . potassium chloride (KLOR-CON) 10 MEQ tablet [Pharmacy Med Name: Potassium Chloride 42mEq Extended-Release Tablet] 30 tablet 11    Sig: Take 1 tablet by mouth every day     Endocrinology:  Minerals - Potassium Supplementation Passed - 09/16/2020  4:01 AM      Passed - K in normal range and within 360 days    Potassium  Date Value Ref Range Status  07/20/2020 3.7 3.5 - 5.2 mmol/L Final         Passed - Cr in normal range and within 360 days    Creat  Date Value Ref Range Status  06/03/2016 1.07 0.60 - 1.35 mg/dL Final   Creatinine, Ser  Date Value Ref Range Status  07/20/2020 1.04 0.76 - 1.27 mg/dL Final         Passed - Valid encounter within last 12 months    Recent Outpatient Visits          1 month ago Possible exposure to STD   [facility] [place], [place]   2 months ago Possible exposure to STD   [facility], Fenton Malling, MD   3 months ago Hypokalemia   [facility] [place], MD   4 months ago Essential hypertension   [facility], Ranell Patrick, MD   5 months ago Hypokalemia   [facility], Ranell Patrick, MD

## 2020-10-02 ENCOUNTER — Other Ambulatory Visit: Payer: Self-pay

## 2020-10-02 ENCOUNTER — Ambulatory Visit (INDEPENDENT_AMBULATORY_CARE_PROVIDER_SITE_OTHER): Payer: Medicare Other | Admitting: Emergency Medicine

## 2020-10-02 ENCOUNTER — Encounter: Payer: Self-pay | Admitting: Emergency Medicine

## 2020-10-02 VITALS — BP 146/101 | HR 68 | Temp 98.1°F | Resp 16 | Ht 69.0 in | Wt 293.0 lb

## 2020-10-02 DIAGNOSIS — Z23 Encounter for immunization: Secondary | ICD-10-CM | POA: Diagnosis not present

## 2020-10-02 DIAGNOSIS — G43709 Chronic migraine without aura, not intractable, without status migrainosus: Secondary | ICD-10-CM

## 2020-10-02 DIAGNOSIS — G4733 Obstructive sleep apnea (adult) (pediatric): Secondary | ICD-10-CM | POA: Diagnosis not present

## 2020-10-02 DIAGNOSIS — I10 Essential (primary) hypertension: Secondary | ICD-10-CM

## 2020-10-02 NOTE — Patient Instructions (Addendum)
   If you have lab work done today you will be contacted with your lab results within the next 2 weeks.  If you have not heard from us then please contact us. The fastest way to get your results is to register for My Chart.   IF you received an x-ray today, you will receive an invoice from [facility]. Please contact [facility] at [phone] with questions or concerns regarding your invoice.   IF you received labwork today, you will receive an invoice from [facility]. Please contact [facility] at [phone] with questions or concerns regarding your invoice.   Our billing staff will not be able to assist you with questions regarding bills from these companies.  You will be contacted with the lab results as soon as they are available. The fastest way to get your results is to activate your My Chart account. Instructions are located on the last page of this paperwork. If you have not heard from us regarding the results in 2 weeks, please contact this office.      Health Maintenance, Male Adopting a healthy lifestyle and getting preventive care are important in promoting health and wellness. Ask your health care provider about:  The right schedule for you to have regular tests and exams.  Things you can do on your own to prevent diseases and keep yourself healthy. What should I know about diet, weight, and exercise? Eat a healthy diet   Eat a diet that includes plenty of vegetables, fruits, low-fat dairy products, and lean protein.  Do not eat a lot of foods that are high in solid fats, added sugars, or sodium. Maintain a healthy weight Body mass index (BMI) is a measurement that can be used to identify possible weight problems. It estimates body fat based on height and weight. Your health care provider can help determine your BMI and help you achieve or maintain a healthy weight. Get regular exercise Get regular exercise. This is one of the most important things you  can do for your health. Most adults should:  Exercise for at least 150 minutes each week. The exercise should increase your heart rate and make you sweat (moderate-intensity exercise).  Do strengthening exercises at least twice a week. This is in addition to the moderate-intensity exercise.  Spend less time sitting. Even light physical activity can be beneficial. Watch cholesterol and blood lipids Have your blood tested for lipids and cholesterol at 51 years of age, then have this test every 5 years. You may need to have your cholesterol levels checked more often if:  Your lipid or cholesterol levels are high.  You are older than 51 years of age.  You are at high risk for heart disease. What should I know about cancer screening? Many types of cancers can be detected early and may often be prevented. Depending on your health history and family history, you may need to have cancer screening at various ages. This may include screening for:  Colorectal cancer.  Prostate cancer.  Skin cancer.  Lung cancer. What should I know about heart disease, diabetes, and high blood pressure? Blood pressure and heart disease  High blood pressure causes heart disease and increases the risk of stroke. This is more likely to develop in people who have high blood pressure readings, are of African descent, or are overweight.  Talk with your health care provider about your target blood pressure readings.  Have your blood pressure checked: ? Every 3-5 years if you are 18-39   years of age. ? Every year if you are 40 years old or older.  If you are between the ages of 65 and 75 and are a current or former smoker, ask your health care provider if you should have a one-time screening for abdominal aortic aneurysm (AAA). Diabetes Have regular diabetes screenings. This checks your fasting blood sugar level. Have the screening done:  Once every three years after age 45 if you are at a normal weight and have  a low risk for diabetes.  More often and at a younger age if you are overweight or have a high risk for diabetes. What should I know about preventing infection? Hepatitis B If you have a higher risk for hepatitis B, you should be screened for this virus. Talk with your health care provider to find out if you are at risk for hepatitis B infection. Hepatitis C Blood testing is recommended for:  Everyone born from 1945 through 1965.  Anyone with known risk factors for hepatitis C. Sexually transmitted infections (STIs)  You should be screened each year for STIs, including gonorrhea and chlamydia, if: ? You are sexually active and are younger than 51 years of age. ? You are older than 51 years of age and your health care provider tells you that you are at risk for this type of infection. ? Your sexual activity has changed since you were last screened, and you are at increased risk for chlamydia or gonorrhea. Ask your health care provider if you are at risk.  Ask your health care provider about whether you are at high risk for HIV. Your health care provider may recommend a prescription medicine to help prevent HIV infection. If you choose to take medicine to prevent HIV, you should first get tested for HIV. You should then be tested every 3 months for as long as you are taking the medicine. Follow these instructions at home: Lifestyle  Do not use any products that contain nicotine or tobacco, such as cigarettes, e-cigarettes, and chewing tobacco. If you need help quitting, ask your health care provider.  Do not use street drugs.  Do not share needles.  Ask your health care provider for help if you need support or information about quitting drugs. Alcohol use  Do not drink alcohol if your health care provider tells you not to drink.  If you drink alcohol: ? Limit how much you have to 0-2 drinks a day. ? Be aware of how much alcohol is in your drink. In the [place] one drink equals one 12  oz bottle of beer (355 mL), one 5 oz glass of wine (148 mL), or one 1 oz glass of hard liquor (44 mL). General instructions  Schedule regular health, dental, and eye exams.  Stay current with your vaccines.  Tell your health care provider if: ? You often feel depressed. ? You have ever been abused or do not feel safe at home. Summary  Adopting a healthy lifestyle and getting preventive care are important in promoting health and wellness.  Follow your health care provider's instructions about healthy diet, exercising, and getting tested or screened for diseases.  Follow your health care provider's instructions on monitoring your cholesterol and blood pressure. This information is not intended to replace advice given to you by your health care provider. Make sure you discuss any questions you have with your health care provider. Document Revised: 12/08/2018 Document Reviewed: 12/08/2018 Elsevier Patient Education  2020 [employer]  

## 2020-10-02 NOTE — Progress Notes (Signed)
Michael [name] 49 y.o.   Chief Complaint  Patient presents with  . Headache    follow up per patient and to get the Shingles shot    HISTORY OF PRESENT ILLNESS: This is a 49 y.o. male with history of chronic recurrent migraine headaches here for follow-up. Also requesting shingles vaccine. Recently went to the ED for uncontrolled migraine headache last August.  Treated and released.  Did well. Feeling better.  No headache today. Fully vaccinated against Covid. Has history of hypertension on calcium channel blocker, ACE inhibitor, and diuretic.  Unknown blood pressure readings at home. No other complaints or medical concerns today.  HPI   Prior to Admission medications   Medication Sig Start Date End Date Taking? Authorizing Provider  amLODipine (NORVASC) 10 MG tablet Take 1 tablet by mouth every day 06/21/20  Yes [name], MD  buPROPion (WELLBUTRIN XL) 150 MG 24 hr tablet Take 1 tablet (150 mg total) by mouth daily. 06/28/20  Yes [name], MD  chlorthalidone (HYGROTON) 50 MG tablet Take 1 tablet (50 mg total) by mouth daily. 05/18/20  Yes [name], MD  lisinopril (ZESTRIL) 40 MG tablet TAKE 1 TABLET(40 MG) BY MOUTH DAILY 07/21/20  Yes [name], MD  potassium chloride (KLOR-CON) 10 MEQ tablet Take 1 tablet by mouth every day 09/16/20  Yes [name], MD  naproxen (NAPROSYN) 500 MG tablet Take 1 tablet (500 mg total) by mouth 2 (two) times daily. Patient not taking: Reported on 10/02/2020 08/09/20   Wieters, Madelynn Done C, PA-C    Allergies  Allergen Reactions  . Statins Other (See Comments)    unknown    Patient Active Problem List   Diagnosis Date Noted  . Morbid obesity with body mass index (BMI) of 40.0 to 44.9 in adult [facility]) 09/21/2019  . CHF (congestive heart failure), NYHA class I, acute, diastolic ([place]) 38/25/0539  . Central sleep apnea comorbid with prescribed opioid use 09/21/2019  . History of uncontrolled hypertension 09/21/2019  .  Complex sleep apnea syndrome 08/18/2019  . Claustrophobia 08/18/2019  . Anxiety associated with depression 08/18/2019  . Hypokalemia 04/04/2017  . Morbid obesity ([place]) 04/04/2017  . Chest wall pain 04/04/2017  . Asthma 02/13/2016  . Non compliance w medication regimen 06/13/2015  . OSA (obstructive sleep apnea) 04/06/2013  . Abnormal EKG 11/08/2012  . Essential hypertension   . HTN (hypertension), malignant 04/29/2012  . Migraine headache with aura 04/29/2012    Past Medical History:  Diagnosis Date  . Anxiety   . Depression    Questionable per records. Not on any medication.   Marland Kitchen History of kidney stones   . Hx of echocardiogram    a. Echo 12/13/12: Mild LVH, EF 76-73%, grade 1 diastolic dysfunction, mild LAE, mild RVE, mild RAE  . Hypertension   . Insomnia   . Migraine   . Noncompliance   . OSA (obstructive sleep apnea)    a. Sleep Study 02/2013:  mod OSA, AHI 33 per hour, O2 sat nadir 85%    Past Surgical History:  Procedure Laterality Date  . Admission  04/2012   Malignant HTN, HA.  CT head negative, cardiology consult.    . CYSTOSCOPY WITH RETROGRADE PYELOGRAM, URETEROSCOPY AND STENT PLACEMENT Left 01/24/2019   Procedure: CYSTOSCOPY WITH RETROGRADE PYELOGRAM, URETEROSCOPY AND STENT PLACEMENT;  Surgeon: Cleon Gustin, MD;  Location: [facility];  Service: Urology;  Laterality: Left;  . HOLMIUM LASER APPLICATION Left 04/16/3789   Procedure: HOLMIUM  LASER APPLICATION;  Surgeon: Cleon Gustin, MD;  Location: [facility];  Service: Urology;  Laterality: Left;  Marland Kitchen MANDIBLE SURGERY  1998   metal plate    Social History   Socioeconomic History  . Marital status: Divorced    Spouse name: Not on file  . Number of children: 4  . Years of education: Not on file  . Highest education level: Not on file  Occupational History  . Occupation: Unemployed//disabled  Tobacco Use  . Smoking status: Never Smoker  . Smokeless tobacco: Never Used   Substance and Sexual Activity  . Alcohol use: No    Alcohol/week: 0.0 standard drinks  . Drug use: No  . Sexual activity: Yes    Comment: per pt's blue health form - 1 sex partner in last 12 months  Other Topics Concern  . Not on file  Social History Narrative   Marital status: single; living with fiance      Children: 4 children, no grandchildren.      Employment:  Disability for learning disabilities since 2012.   Right-handed   Caffeine: occasional         Social Determinants of Health   Financial Resource Strain:   . Difficulty of Paying Living Expenses: Not on file  Food Insecurity:   . Worried About Charity fundraiser in the Last Year: Not on file  . Ran Out of Food in the Last Year: Not on file  Transportation Needs:   . Lack of Transportation (Medical): Not on file  . Lack of Transportation (Non-Medical): Not on file  Physical Activity:   . Days of Exercise per Week: Not on file  . Minutes of Exercise per Session: Not on file  Stress:   . Feeling of Stress : Not on file  Social Connections:   . Frequency of Communication with Friends and Family: Not on file  . Frequency of Social Gatherings with Friends and Family: Not on file  . Attends Religious Services: Not on file  . Active Member of Clubs or Organizations: Not on file  . Attends Archivist Meetings: Not on file  . Marital Status: Not on file  Intimate Partner Violence:   . Fear of Current or Ex-Partner: Not on file  . Emotionally Abused: Not on file  . Physically Abused: Not on file  . Sexually Abused: Not on file    Family History  Problem Relation Age of Onset  . Cancer Father        prostate, colon- age was in his 56's  . Heart disease Father 47       MI   . Hypertension Father   . Colon cancer Father   . Diabetes Mother   . Hypertension Mother   . Hypertension Brother   . Cancer Maternal Grandfather   . Cancer Paternal Grandfather   . Esophageal cancer Neg Hx   . Stomach cancer  Neg Hx   . Rectal cancer Neg Hx      Review of Systems  Constitutional: Negative.  Negative for chills and fever.  HENT: Negative.  Negative for congestion and sore throat.   Respiratory: Negative.  Negative for cough and shortness of breath.   Cardiovascular: Negative.  Negative for chest pain and palpitations.  Gastrointestinal: Negative.  Negative for abdominal pain, blood in stool, diarrhea, melena, nausea and vomiting.  Genitourinary: Negative.  Negative for dysuria and hematuria.  Musculoskeletal: Negative.  Negative for myalgias and neck pain.  Skin:  Negative.  Negative for rash.  Neurological: Positive for headaches. Negative for dizziness, focal weakness, seizures and loss of consciousness.  Endo/Heme/Allergies: Negative.   All other systems reviewed and are negative.  Today's Vitals   10/02/20 0954  BP: (!) 146/101  Pulse: 68  Resp: 16  Temp: 98.1 F (36.7 C)  TempSrc: Temporal  SpO2: 95%  Weight: 293 lb (132.9 kg)  Height: 5\' 9"  (1.753 m)   Body mass index is 43.27 kg/m.   Physical Exam Vitals reviewed.  Constitutional:      Appearance: He is well-developed.  HENT:     Head: Normocephalic.  Eyes:     Extraocular Movements: Extraocular movements intact.     Pupils: Pupils are equal, round, and reactive to light.  Cardiovascular:     Rate and Rhythm: Normal rate.  Pulmonary:     Effort: Pulmonary effort is normal.  Musculoskeletal:        General: Normal range of motion.     Cervical back: Normal range of motion.  Skin:    General: Skin is warm and dry.  Neurological:     General: No focal deficit present.     Mental Status: He is alert and oriented to person, place, and time.  Psychiatric:        Mood and Affect: Mood normal.        Behavior: Behavior normal.     A total of 30 minutes was spent with the patient, greater than 50% of which was in counseling/coordination of care regarding hypertension and cardiovascular risks associated with this  condition, education on diet and nutrition, review of all medications, review of most recent office visit notes and most recent emergency department visit, review of most recent blood work results, documentation, prognosis and need for follow-up.   ASSESSMENT & PLAN: Clinically stable.  However blood pressure elevated in the office.  Advised to monitor blood pressure readings at home.  Continue present medications.  No changes at this time.  Diet and nutrition discussed as well as to increase daily physical activity. Follow-up with Dr. Carlota Raspberry as scheduled.  Allard was seen today for headache.  Diagnoses and all orders for this visit:  Chronic migraine without aura without status migrainosus, not intractable  Essential hypertension  OSA treated with BiPAP  Need for shingles vaccine  Need for prophylactic vaccination and inoculation against influenza -     Flu Vaccine QUAD 36+ mos IM -     Varicella-zoster vaccine IM    Patient Instructions       If you have lab work done today you will be contacted with your lab results within the next 2 weeks.  If you have not heard from [place] then please contact us. The fastest way to get your results is to register for My Chart.   IF you received an x-ray today, you will receive an invoice from [facility]. Please contact [facility] Radiology at [phone] with questions or concerns regarding your invoice.   IF you received labwork today, you will receive an invoice from [place]. Please contact [facility] at [phone] with questions or concerns regarding your invoice.   Our billing staff will not be able to assist you with questions regarding bills from these companies.  You will be contacted with the lab results as soon as they are available. The fastest way to get your results is to activate your My Chart account. Instructions are located on the last page of this paperwork. If you have not heard  from [place] regarding the results in 2  weeks, please contact this office.     Health Maintenance, Male Adopting a healthy lifestyle and getting preventive care are important in promoting health and wellness. Ask your health care provider about:  The right schedule for you to have regular tests and exams.  Things you can do on your own to prevent diseases and keep yourself healthy. What should I know about diet, weight, and exercise? Eat a healthy diet   Eat a diet that includes plenty of vegetables, fruits, low-fat dairy products, and lean protein.  Do not eat a lot of foods that are high in solid fats, added sugars, or sodium. Maintain a healthy weight Body mass index (BMI) is a measurement that can be used to identify possible weight problems. It estimates body fat based on height and weight. Your health care provider can help determine your BMI and help you achieve or maintain a healthy weight. Get regular exercise Get regular exercise. This is one of the most important things you can do for your health. Most adults should:  Exercise for at least 150 minutes each week. The exercise should increase your heart rate and make you sweat (moderate-intensity exercise).  Do strengthening exercises at least twice a week. This is in addition to the moderate-intensity exercise.  Spend less time sitting. Even light physical activity can be beneficial. Watch cholesterol and blood lipids Have your blood tested for lipids and cholesterol at 52 years of age, then have this test every 5 years. You may need to have your cholesterol levels checked more often if:  Your lipid or cholesterol levels are high.  You are older than 51 years of age.  You are at high risk for heart disease. What should I know about cancer screening? Many types of cancers can be detected early and may often be prevented. Depending on your health history and family history, you may need to have cancer screening at various ages. This may include screening  for:  Colorectal cancer.  Prostate cancer.  Skin cancer.  Lung cancer. What should I know about heart disease, diabetes, and high blood pressure? Blood pressure and heart disease  High blood pressure causes heart disease and increases the risk of stroke. This is more likely to develop in people who have high blood pressure readings, are of African descent, or are overweight.  Talk with your health care provider about your target blood pressure readings.  Have your blood pressure checked: ? Every 3-5 years if you are 83-79 years of age. ? Every year if you are 49 years old or older.  If you are between the ages of 41 and 62 and are a current or former smoker, ask your health care provider if you should have a one-time screening for abdominal aortic aneurysm (AAA). Diabetes Have regular diabetes screenings. This checks your fasting blood sugar level. Have the screening done:  Once every three years after age 37 if you are at a normal weight and have a low risk for diabetes.  More often and at a younger age if you are overweight or have a high risk for diabetes. What should I know about preventing infection? Hepatitis B If you have a higher risk for hepatitis B, you should be screened for this virus. Talk with your health care provider to find out if you are at risk for hepatitis B infection. Hepatitis C Blood testing is recommended for:  Everyone born from [birth year] through 1967.  Anyone with  known risk factors for hepatitis C. Sexually transmitted infections (STIs)  You should be screened each year for STIs, including gonorrhea and chlamydia, if: ? You are sexually active and are younger than 51 years of age. ? You are older than 51 years of age and your health care provider tells you that you are at risk for this type of infection. ? Your sexual activity has changed since you were last screened, and you are at increased risk for chlamydia or gonorrhea. Ask your health care  provider if you are at risk.  Ask your health care provider about whether you are at high risk for HIV. Your health care provider may recommend a prescription medicine to help prevent HIV infection. If you choose to take medicine to prevent HIV, you should first get tested for HIV. You should then be tested every 3 months for as long as you are taking the medicine. Follow these instructions at home: Lifestyle  Do not use any products that contain nicotine or tobacco, such as cigarettes, e-cigarettes, and chewing tobacco. If you need help quitting, ask your health care provider.  Do not use street drugs.  Do not share needles.  Ask your health care provider for help if you need support or information about quitting drugs. Alcohol use  Do not drink alcohol if your health care provider tells you not to drink.  If you drink alcohol: ? Limit how much you have to 0-2 drinks a day. ? Be aware of how much alcohol is in your drink. In the [place] one drink equals one 12 oz bottle of beer (355 mL), one 5 oz glass of wine (148 mL), or one 1 oz glass of hard liquor (44 mL). General instructions  Schedule regular health, dental, and eye exams.  Stay current with your vaccines.  Tell your health care provider if: ? You often feel depressed. ? You have ever been abused or do not feel safe at home. Summary  Adopting a healthy lifestyle and getting preventive care are important in promoting health and wellness.  Follow your health care provider's instructions about healthy diet, exercising, and getting tested or screened for diseases.  Follow your health care provider's instructions on monitoring your cholesterol and blood pressure. This information is not intended to replace advice given to you by your health care provider. Make sure you discuss any questions you have with your health care provider. Document Revised: 12/08/2018 Document Reviewed: 12/08/2018 Elsevier Patient Education  2020  Elsevier Inc.     Agustina Caroli, MD [facility]

## 2020-10-16 ENCOUNTER — Other Ambulatory Visit: Payer: Self-pay | Admitting: Family Medicine

## 2020-10-16 DIAGNOSIS — F32A Depression, unspecified: Secondary | ICD-10-CM

## 2020-10-16 NOTE — Telephone Encounter (Signed)
Requested Prescriptions  Pending Prescriptions Disp Refills  . buPROPion (WELLBUTRIN XL) 150 MG 24 hr tablet [Pharmacy Med Name: Bupropion Hydrochloride 150mg  Extended-Release (XL) Tablet] 90 tablet 1    Sig: Take 1 tablet by mouth every day     Psychiatry: Antidepressants - bupropion Failed - 10/16/2020  3:28 AM      Failed - Last BP in normal range    BP Readings from Last 1 Encounters:  10/02/20 (!) 146/101         Passed - Completed PHQ-2 or PHQ-9 in the last 360 days.      Passed - Valid encounter within last 6 months    Recent Outpatient Visits          2 weeks ago Chronic migraine without aura without status migrainosus, not intractable   [facility] at [facility], Ines Bloomer, MD   2 months ago Possible exposure to STD   [facility] at [place], [place]   3 months ago Possible exposure to STD   Primary Care at [facility], Fenton Malling, MD   4 months ago Hypokalemia   [facility] at [place], MD   5 months ago Essential hypertension   [facility] at [facility], Ranell Patrick, MD

## 2020-10-24 ENCOUNTER — Encounter: Payer: Self-pay | Admitting: Registered Nurse

## 2020-10-24 ENCOUNTER — Ambulatory Visit (INDEPENDENT_AMBULATORY_CARE_PROVIDER_SITE_OTHER): Payer: Medicare Other

## 2020-10-24 ENCOUNTER — Other Ambulatory Visit: Payer: Self-pay

## 2020-10-24 ENCOUNTER — Ambulatory Visit (INDEPENDENT_AMBULATORY_CARE_PROVIDER_SITE_OTHER): Payer: Medicare Other | Admitting: Registered Nurse

## 2020-10-24 VITALS — BP 169/113 | HR 74 | Temp 98.0°F | Resp 18 | Ht 69.0 in | Wt 298.0 lb

## 2020-10-24 DIAGNOSIS — M25562 Pain in left knee: Secondary | ICD-10-CM

## 2020-10-24 IMAGING — DX DG KNEE COMPLETE 4+V*L*
4 series · 4 of 4 positions shown · non-contrast
Comparison: Hip [DATE]

CLINICAL DATA: Acute on chronic LEFT knee pain

EXAM:
LEFT KNEE - COMPLETE 4+ VIEW

[knee ap]
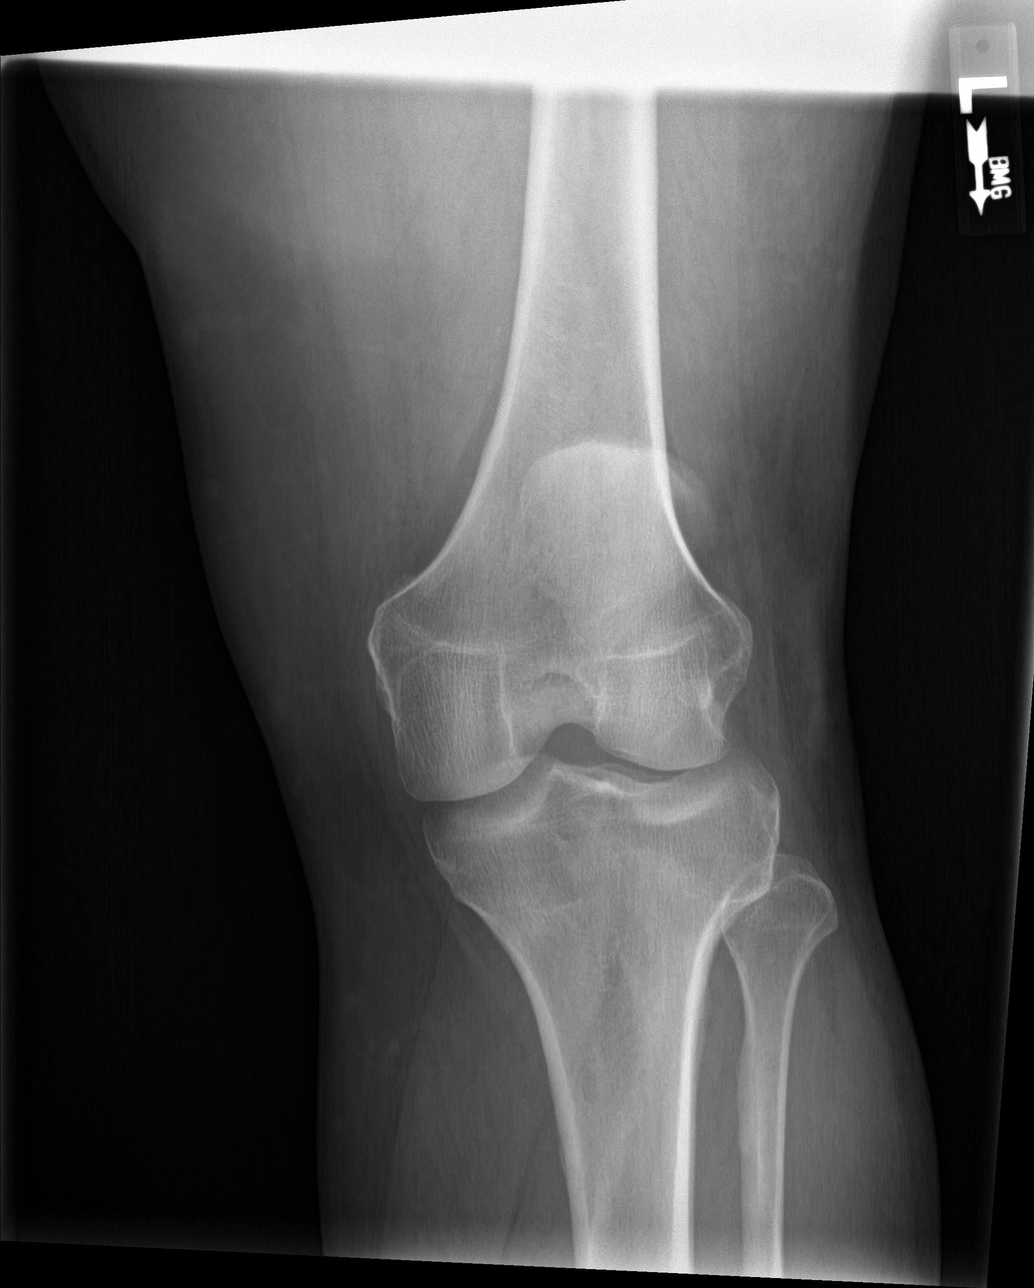

[knee lat]
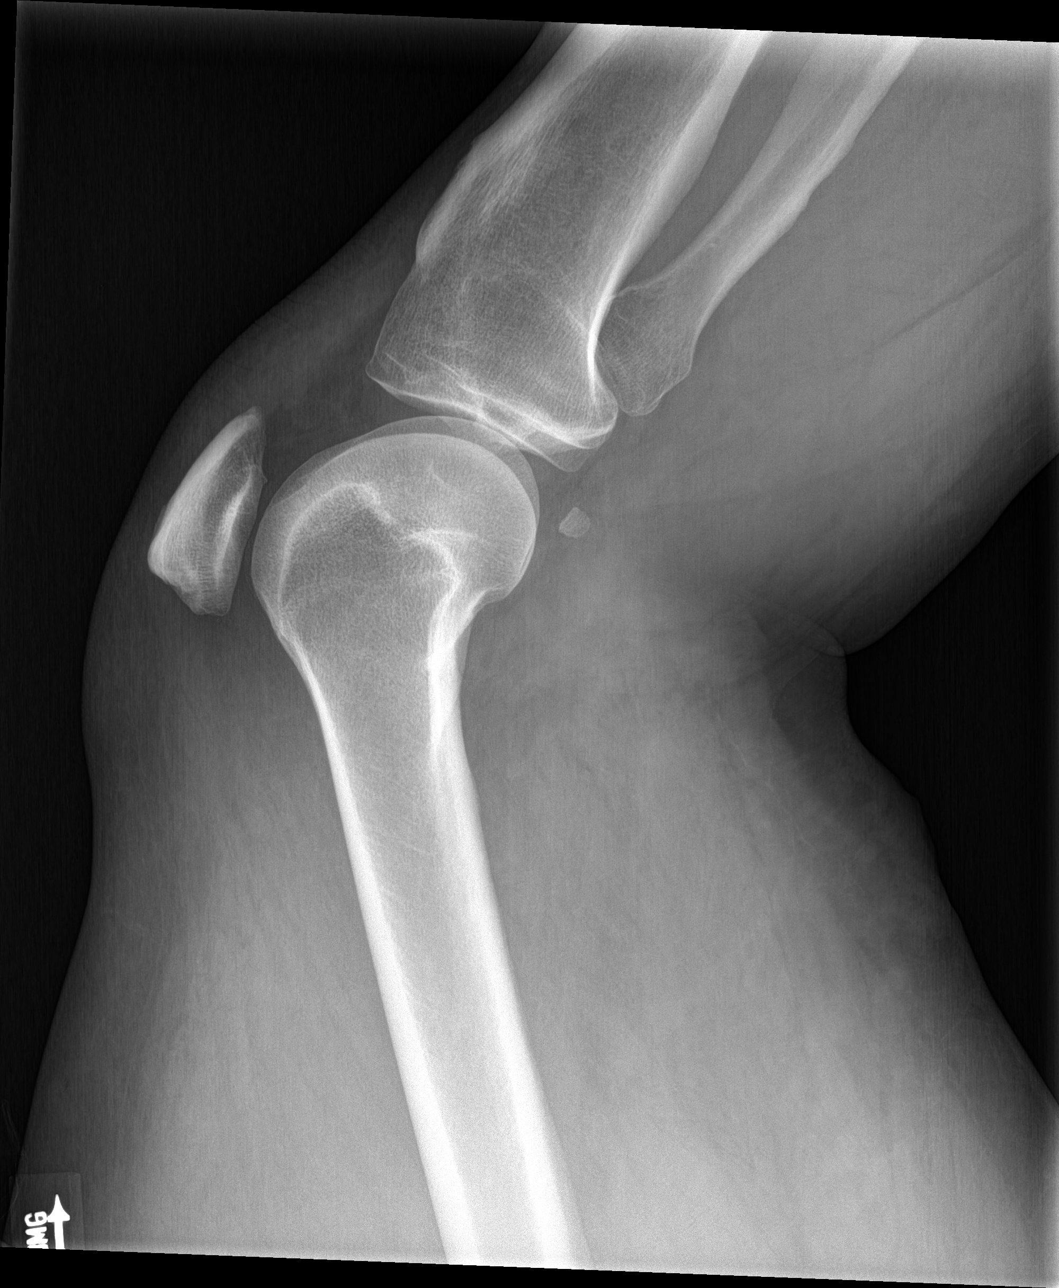

[sunrise]
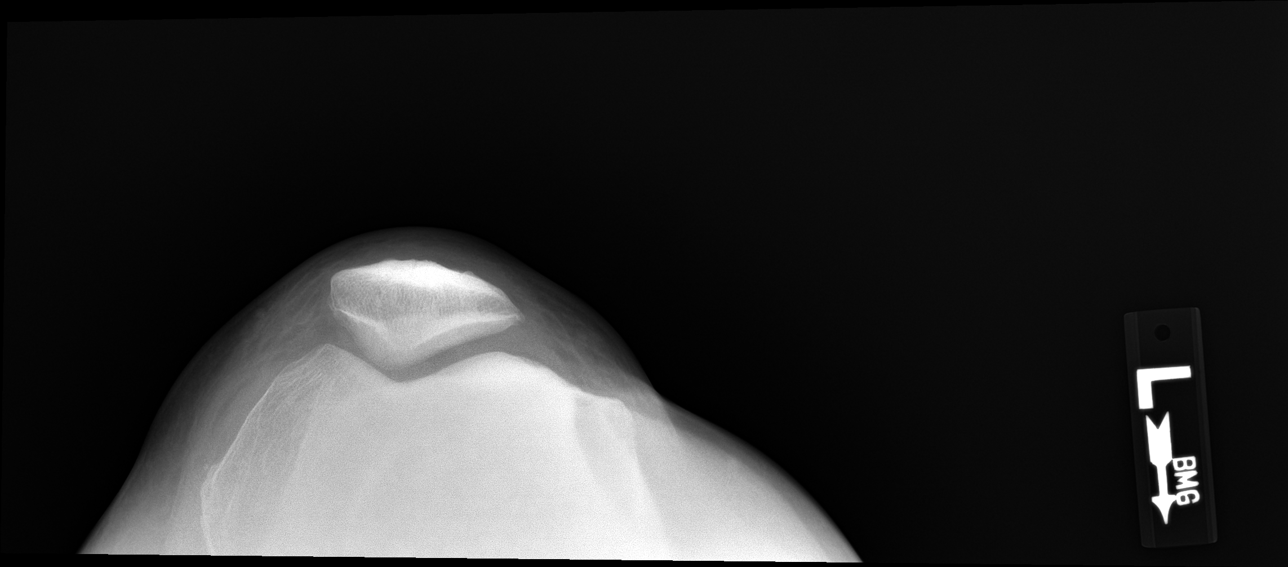

[knee [person_name]]
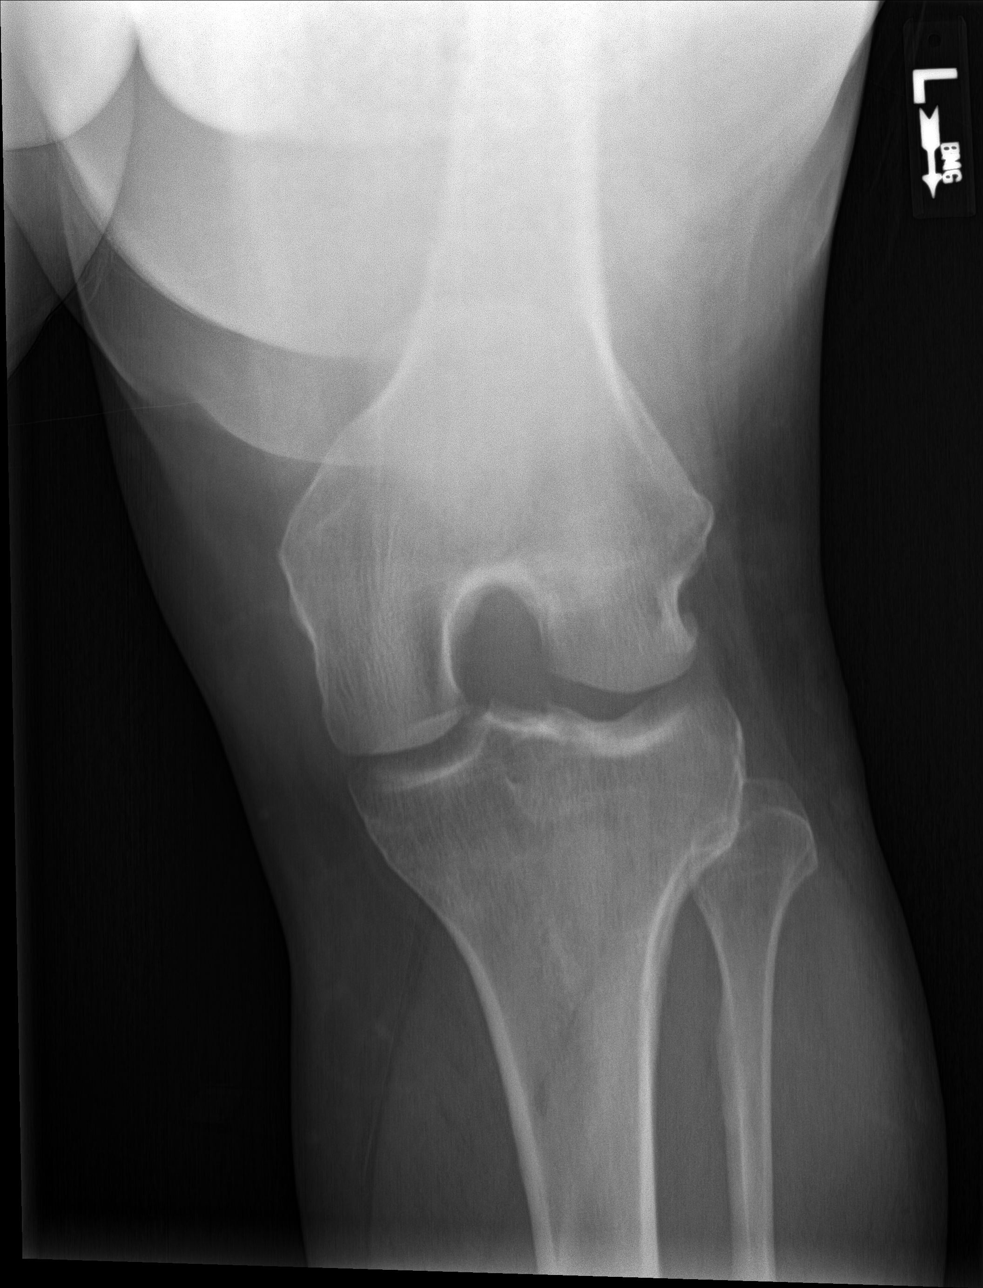

[4 of 4 positions shown; findings below may reference images not displayed]

FINDINGS: No evidence of fracture, dislocation, or joint effusion. No evidence
of arthropathy or other focal bone abnormality. Soft tissues are
unremarkable.
IMPRESSION: Negative evaluation of the LEFT knee

## 2020-10-24 MED ORDER — MELOXICAM 7.5 MG PO TABS: 7.5000 mg | ORAL_TABLET | Freq: Every day | ORAL | 0 refills | Status: DC

## 2020-10-24 MED ORDER — TRAMADOL HCL 50 MG PO TABS: 50.0000 mg | ORAL_TABLET | Freq: Three times a day (TID) | ORAL | 0 refills | Status: AC | PRN

## 2020-10-24 NOTE — Patient Instructions (Addendum)
If you have lab work done today you will be contacted with your lab results within the next 2 weeks.  If you have not heard from [place] then please contact us. The fastest way to get your results is to register for My Chart.   IF you received an x-ray today, you will receive an invoice from [facility]. Please contact [facility] Radiology at [phone] with questions or concerns regarding your invoice.   IF you received labwork today, you will receive an invoice from [place]. Please contact [facility] at [phone] with questions or concerns regarding your invoice.   Our billing staff will not be able to assist you with questions regarding bills from these companies.  You will be contacted with the lab results as soon as they are available. The fastest way to get your results is to activate your My Chart account. Instructions are located on the last page of this paperwork. If you have not heard from [place] regarding the results in 2 weeks, please contact this office.      Acute Knee Pain, Adult Acute knee pain is sudden and may be caused by damage, swelling, or irritation of the muscles and tissues that support your knee. The injury may result from:  A fall.  An injury to your knee from twisting motions.  A hit to the knee.  Infection. Acute knee pain may go away on its own with time and rest. If it does not, your health care provider may order tests to find the cause of the pain. These may include:  Imaging tests, such as an X-ray, MRI, or ultrasound.  Joint aspiration. In this test, fluid is removed from the knee.  Arthroscopy. In this test, a lighted tube is inserted into the knee and an image is projected onto a TV screen.  Biopsy. In this test, a sample of tissue is removed from the body and studied under a microscope. Follow these instructions at home: Pay attention to any changes in your symptoms. Take these actions to relieve your pain. If you have a knee sleeve  or brace:   Wear the sleeve or brace as told by your health care provider. Remove it only as told by your health care provider.  Loosen the sleeve or brace if your toes tingle, become numb, or turn cold and blue.  Keep the sleeve or brace clean.  If the sleeve or brace is not waterproof: ? Do not let it get wet. ? Cover it with a watertight covering when you take a bath or shower. Activity  Rest your knee.  Do not do things that cause pain or make pain worse.  Avoid high-impact activities or exercises, such as running, jumping rope, or doing jumping jacks.  Work with a physical therapist to make a safe exercise program, as recommended by your health care provider. Do exercises as told by your physical therapist. Managing pain, stiffness, and swelling   If directed, put ice on the knee: ? Put ice in a plastic bag. ? Place a towel between your skin and the bag. ? Leave the ice on for 20 minutes, 2-3 times a day.  If directed, use an elastic bandage to put pressure (compression) on your injured knee. This may control swelling, give support, and help with discomfort. General instructions  Take over-the-counter and prescription medicines only as told by your health care provider.  Raise (elevate) your knee above the level of your heart when you are sitting or lying down.  Sleep with a pillow under your knee.  Do not use any products that contain nicotine or tobacco, such as cigarettes, e-cigarettes, and chewing tobacco. These can delay healing. If you need help quitting, ask your health care provider.  If you are overweight, work with your health care provider and a dietitian to set a weight-loss goal that is healthy and reasonable for you. Extra weight can put pressure on your knee.  Keep all follow-up visits as told by your health care provider. This is important. Contact a health care provider if:  Your knee pain continues, changes, or gets worse.  You have a fever along  with knee pain.  Your knee feels warm to the touch.  Your knee buckles or locks up. Get help right away if:  Your knee swells, and the swelling becomes worse.  You cannot move your knee.  You have severe pain in your knee. Summary  Acute knee pain can be caused by a fall, an injury, an infection, or damage, swelling, or irritation of the tissues that support your knee.  Your health care provider may perform tests to find out the cause of the pain.  Pay attention to any changes in your symptoms. Relieve your pain with rest, medicines, light activity, and use of ice.  Get help if your pain continues or becomes worse, your knee swells, or you cannot move your knee. This information is not intended to replace advice given to you by your health care provider. Make sure you discuss any questions you have with your health care provider. Document Revised: 05/27/2018 Document Reviewed: 05/27/2018 Elsevier Patient Education  [place].

## 2020-10-24 NOTE — Progress Notes (Signed)
Established Patient Office Visit  Subjective:  Patient ID: Michael [name], male    DOB: [birth year]  Age: 49 y.o. MRN: [id number]  CC:  Chief Complaint  Patient presents with  . Knee Pain    Patient states he has been having some knee pain for a while and its starting to get worse, Per patient he is taking tylenol with no relief.    HPI Michael [name] presents for L knee pain  Onset a few weeks ago - maybe 1-2 mos. Worsening. Hard to bear weight on it at this time. Feels stiff. Significantly limited flexion and extension. Unsure if it's swollen or red - but pt states a family member has told him it looks swollen Feels like pain is mostly anterior joint line Feels like pain radiates down calf and up into thigh somewhat as well. Has had ongoing problems with his lower back Has been taking tylenol with limited relief - relief is getting less as pain worsens.   Past Medical History:  Diagnosis Date  . Anxiety   . Depression    Questionable per records. Not on any medication.   Marland Kitchen History of kidney stones   . Hx of echocardiogram    a. Echo 12/13/12: Mild LVH, EF 27-78%, grade 1 diastolic dysfunction, mild LAE, mild RVE, mild RAE  . Hypertension   . Insomnia   . Migraine   . Noncompliance   . OSA (obstructive sleep apnea)    a. Sleep Study 02/2013:  mod OSA, AHI 33 per hour, O2 sat nadir 85%    Past Surgical History:  Procedure Laterality Date  . Admission  04/2012   Malignant HTN, HA.  CT head negative, cardiology consult.    . CYSTOSCOPY WITH RETROGRADE PYELOGRAM, URETEROSCOPY AND STENT PLACEMENT Left 01/24/2019   Procedure: CYSTOSCOPY WITH RETROGRADE PYELOGRAM, URETEROSCOPY AND STENT PLACEMENT;  Surgeon: Cleon Gustin, MD;  Location: [facility];  Service: Urology;  Laterality: Left;  . HOLMIUM LASER APPLICATION Left 2/42/3536   Procedure: HOLMIUM LASER APPLICATION;  Surgeon: Cleon Gustin, MD;  Location: [facility];  Service: Urology;   Laterality: Left;  Marland Kitchen MANDIBLE SURGERY  1998   metal plate    Family History  Problem Relation Age of Onset  . Cancer Father        prostate, colon- age was in his 34's  . Heart disease Father 30       MI   . Hypertension Father   . Colon cancer Father   . Diabetes Mother   . Hypertension Mother   . Hypertension Brother   . Cancer Maternal Grandfather   . Cancer Paternal Grandfather   . Esophageal cancer Neg Hx   . Stomach cancer Neg Hx   . Rectal cancer Neg Hx     Social History   Socioeconomic History  . Marital status: Divorced    Spouse name: Not on file  . Number of children: 4  . Years of education: Not on file  . Highest education level: Not on file  Occupational History  . Occupation: Unemployed//disabled  Tobacco Use  . Smoking status: Never Smoker  . Smokeless tobacco: Never Used  Substance and Sexual Activity  . Alcohol use: No    Alcohol/week: 0.0 standard drinks  . Drug use: No  . Sexual activity: Yes    Comment: per pt's blue health form - 1 sex partner in last 12 months  Other Topics Concern  . Not on file  Social  History Narrative   Marital status: single; living with fiance      Children: 4 children, no grandchildren.      Employment:  Disability for learning disabilities since 2012.   Right-handed   Caffeine: occasional         Social Determinants of Health   Financial Resource Strain:   . Difficulty of Paying Living Expenses: Not on file  Food Insecurity:   . Worried About Charity fundraiser in the Last Year: Not on file  . Ran Out of Food in the Last Year: Not on file  Transportation Needs:   . Lack of Transportation (Medical): Not on file  . Lack of Transportation (Non-Medical): Not on file  Physical Activity:   . Days of Exercise per Week: Not on file  . Minutes of Exercise per Session: Not on file  Stress:   . Feeling of Stress : Not on file  Social Connections:   . Frequency of Communication with Friends and Family: Not on  file  . Frequency of Social Gatherings with Friends and Family: Not on file  . Attends Religious Services: Not on file  . Active Member of Clubs or Organizations: Not on file  . Attends Archivist Meetings: Not on file  . Marital Status: Not on file  Intimate Partner Violence:   . Fear of Current or Ex-Partner: Not on file  . Emotionally Abused: Not on file  . Physically Abused: Not on file  . Sexually Abused: Not on file    Outpatient Medications Prior to Visit  Medication Sig Dispense Refill  . amLODipine (NORVASC) 10 MG tablet Take 1 tablet by mouth every day 90 tablet 1  . buPROPion (WELLBUTRIN XL) 150 MG 24 hr tablet Take 1 tablet by mouth every day 90 tablet 1  . chlorthalidone (HYGROTON) 50 MG tablet Take 1 tablet (50 mg total) by mouth daily. 30 tablet 1  . lisinopril (ZESTRIL) 40 MG tablet TAKE 1 TABLET(40 MG) BY MOUTH DAILY 90 tablet 0  . potassium chloride (KLOR-CON) 10 MEQ tablet Take 1 tablet by mouth every day 90 tablet 3  . naproxen (NAPROSYN) 500 MG tablet Take 1 tablet (500 mg total) by mouth 2 (two) times daily. (Patient not taking: Reported on 10/02/2020) 30 tablet 0   Facility-Administered Medications Prior to Visit  Medication Dose Route Frequency Provider Last Rate Last Admin  . 0.9 %  sodium chloride infusion  500 mL Intravenous Continuous Ladene Artist, MD        Allergies  Allergen Reactions  . Statins Other (See Comments)    unknown    ROS Review of Systems  Constitutional: Negative.   HENT: Negative.   Eyes: Negative.   Respiratory: Negative.   Cardiovascular: Negative.   Gastrointestinal: Negative.   Genitourinary: Negative.   Musculoskeletal: Positive for arthralgias and back pain. Negative for gait problem, joint swelling, myalgias, neck pain and neck stiffness.  Skin: Negative.   Neurological: Negative.   Psychiatric/Behavioral: Negative.       Objective:    Physical Exam Constitutional:      General: He is not in acute  distress.    Appearance: Normal appearance. He is normal weight. He is not ill-appearing, toxic-appearing or diaphoretic.  Cardiovascular:     Rate and Rhythm: Normal rate and regular rhythm.     Heart sounds: Normal heart sounds. No murmur heard.  No friction rub. No gallop.   Pulmonary:     Effort: Pulmonary effort is normal.  No respiratory distress.     Breath sounds: Normal breath sounds. No stridor. No wheezing, rhonchi or rales.  Chest:     Chest wall: No tenderness.  Musculoskeletal:        General: Swelling and tenderness (joint line of L knee) present. No deformity or signs of injury.     Right lower leg: No edema.     Left lower leg: No edema.     Comments: Limited flexion and extension in L knee. Around 15 deg extension and 100 deg flexion. Full ROM in R knee.   Skin:    General: Skin is warm and dry.     Coloration: Skin is not jaundiced or pale.     Findings: No bruising, erythema, lesion or rash.  Neurological:     General: No focal deficit present.     Mental Status: He is alert and oriented to person, place, and time. Mental status is at baseline.  Psychiatric:        Mood and Affect: Mood normal.        Behavior: Behavior normal.        Thought Content: Thought content normal.        Judgment: Judgment normal.     BP (!) 169/113   Pulse 74   Temp 98 F (36.7 C) (Temporal)   Resp 18   Ht 5\' 9"  (1.753 m)   Wt 298 lb (135.2 kg)   SpO2 98%   BMI 44.01 kg/m  Wt Readings from Last 3 Encounters:  10/24/20 298 lb (135.2 kg)  10/02/20 293 lb (132.9 kg)  07/20/20 (!) 295 lb (133.8 kg)     There are no preventive care reminders to display for this patient.  There are no preventive care reminders to display for this patient.  Lab Results  Component Value Date   TSH 1.100 04/11/2020   Lab Results  Component Value Date   WBC 4.5 04/01/2020   HGB 13.3 04/01/2020   HCT 39.0 04/01/2020   MCV 89.3 04/01/2020   PLT 229 04/01/2020   Lab Results    Component Value Date   NA 142 07/20/2020   K 3.7 07/20/2020   CO2 25 07/20/2020   GLUCOSE 90 07/20/2020   BUN 17 07/20/2020   CREATININE 1.04 07/20/2020   BILITOT 0.8 04/01/2020   ALKPHOS 47 04/01/2020   AST 16 04/01/2020   ALT 16 04/01/2020   PROT 6.6 04/01/2020   ALBUMIN 3.3 (L) 04/01/2020   CALCIUM 9.5 07/20/2020   ANIONGAP 12 04/01/2020   GFR 112.44 01/28/2013   Lab Results  Component Value Date   CHOL 159 03/22/2020   Lab Results  Component Value Date   HDL 44 03/22/2020   Lab Results  Component Value Date   LDLCALC 100 (H) 03/22/2020   Lab Results  Component Value Date   TRIG 79 03/22/2020   Lab Results  Component Value Date   CHOLHDL 3.6 03/22/2020   Lab Results  Component Value Date   HGBA1C 5.6 06/13/2015      Assessment & Plan:   Problem List Items Addressed This Visit    None    Visit Diagnoses    Acute pain of left knee    -  Primary   Relevant Orders   DG Knee Complete 4 Views Left      No orders of the defined types were placed in this encounter.   Follow-up: No follow-ups on file.   PLAN  Xray wnl  Doubt  soft tissue injury but some concern for limits in ROM. Will get PT consult and consider ortho if concerns arise.  meloxicam 7.5mg  PO qd PRN. No other NSAIDs with this. May supplement with tylenol if needed  Tramadol 50mg  PO qhs PRN.   RICE method discussed.  Return prn  Patient encouraged to call clinic with any questions, comments, or concerns.  Maximiano Coss, NP

## 2020-10-30 ENCOUNTER — Encounter: Payer: Self-pay | Admitting: Family Medicine

## 2020-10-30 ENCOUNTER — Ambulatory Visit (INDEPENDENT_AMBULATORY_CARE_PROVIDER_SITE_OTHER): Payer: Medicare Other | Admitting: Family Medicine

## 2020-10-30 ENCOUNTER — Other Ambulatory Visit: Payer: Self-pay

## 2020-10-30 DIAGNOSIS — M25562 Pain in left knee: Secondary | ICD-10-CM | POA: Diagnosis not present

## 2020-10-30 NOTE — Progress Notes (Signed)
Office Visit Note   Patient: Michael [name]           Date of Birth: February 24, 1971           MRN: [id number] Visit Date: 10/30/2020 Requested by: [name], MD [address] [place],  [place] [address] PCP: [name], MD  Subjective: Chief Complaint  Patient presents with  . Left Knee - Pain    Pain in the anterior knee x several weeks now. No known recent injury, but does recall a fall last year in his apartment - he is unsure if that knee was affected. Has had xrays by PCP. Stands all day at work - hurts.    HPI: He is here with left knee pain.  Symptoms started about 2 weeks ago, no injury.  He woke up 1 day with pain and swelling in the knee and it has not quit bothering him since then.  He tried soaking in Epsom salt.  He tried meloxicam.  Nothing really seems to help.  Pain is on the medial and lateral aspect when he tries to bend or twist his knee.  Denies any locking.  He had a fall about a year ago resulting in bruising on his right lower extremity, but did not have any left knee pain.  There is no family history of gout to his knowledge.  He works for [employer] and his job involves standing on hard floors for long periods of time.               ROS:   All other systems were reviewed and are negative.  Objective: Vital Signs: There were no vitals taken for this visit.  Physical Exam:  General:  Alert and oriented, in no acute distress. Pulm:  Breathing unlabored. Psy:  Normal mood, congruent affect. Skin: No erythema Left knee: 1+ effusion with no warmth.  No significant patella crepitus, negative patella apprehension, negative patella compression.  Lachman's is solid, no laxity with varus or valgus stress.  He is tender on the medial and lateral joint lines but there is no palpable click with McMurray's.  Full active extension, flexion motion limited to about 90 degrees.  Imaging: None today.  Recent x-rays viewed on computer reveal no significant degenerative  change, no sign of loose body or stress fracture.  Assessment & Plan: 1.  Left knee pain with effusion, cannot rule out degenerative meniscus tear. -Discussed options with him and he wants to try cortisone injection.  If this does not help, then MRI scan.     Procedures: Left knee steroid injection: After sterile prep with Betadine, injected 5 cc 1% lidocaine without epinephrine and 40 mg methylprednisolone from lateral midpatellar approach.  A flash of clear yellow synovial fluid was obtained prior to injection.    PMFS History: Patient Active Problem List   Diagnosis Date Noted  . Morbid obesity with body mass index (BMI) of 40.0 to 44.9 in adult [facility]) 09/21/2019  . CHF (congestive heart failure), NYHA class I, acute, diastolic ([place]) 67/89/3810  . Central sleep apnea comorbid with prescribed opioid use 09/21/2019  . History of uncontrolled hypertension 09/21/2019  . Complex sleep apnea syndrome 08/18/2019  . Claustrophobia 08/18/2019  . Anxiety associated with depression 08/18/2019  . Hypokalemia 04/04/2017  . Morbid obesity ([place]) 04/04/2017  . Chest wall pain 04/04/2017  . Asthma 02/13/2016  . Non compliance w medication regimen 06/13/2015  . OSA (obstructive sleep apnea) 04/06/2013  . Abnormal EKG 11/08/2012  .  Essential hypertension   . HTN (hypertension), malignant 04/29/2012  . Migraine headache with aura 04/29/2012   Past Medical History:  Diagnosis Date  . Anxiety   . Depression    Questionable per records. Not on any medication.   Marland Kitchen History of kidney stones   . Hx of echocardiogram    a. Echo 12/13/12: Mild LVH, EF 82-42%, grade 1 diastolic dysfunction, mild LAE, mild RVE, mild RAE  . Hypertension   . Insomnia   . Migraine   . Noncompliance   . OSA (obstructive sleep apnea)    a. Sleep Study 02/2013:  mod OSA, AHI 33 per hour, O2 sat nadir 85%    Family History  Problem Relation Age of Onset  . Cancer Father        prostate, colon- age was in his 3's  .  Heart disease Father 76       MI   . Hypertension Father   . Colon cancer Father   . Diabetes Mother   . Hypertension Mother   . Hypertension Brother   . Cancer Maternal Grandfather   . Cancer Paternal Grandfather   . Esophageal cancer Neg Hx   . Stomach cancer Neg Hx   . Rectal cancer Neg Hx     Past Surgical History:  Procedure Laterality Date  . Admission  04/2012   Malignant HTN, HA.  CT head negative, cardiology consult.    . CYSTOSCOPY WITH RETROGRADE PYELOGRAM, URETEROSCOPY AND STENT PLACEMENT Left 01/24/2019   Procedure: CYSTOSCOPY WITH RETROGRADE PYELOGRAM, URETEROSCOPY AND STENT PLACEMENT;  Surgeon: Cleon Gustin, MD;  Location: [facility];  Service: Urology;  Laterality: Left;  . HOLMIUM LASER APPLICATION Left 3/53/6144   Procedure: HOLMIUM LASER APPLICATION;  Surgeon: Cleon Gustin, MD;  Location: [facility];  Service: Urology;  Laterality: Left;  Marland Kitchen MANDIBLE SURGERY  1998   metal plate   Social History   Occupational History  . Occupation: Unemployed//disabled  Tobacco Use  . Smoking status: Never Smoker  . Smokeless tobacco: Never Used  Substance and Sexual Activity  . Alcohol use: No    Alcohol/week: 0.0 standard drinks  . Drug use: No  . Sexual activity: Yes    Comment: per pt's blue health form - 1 sex partner in last 12 months

## 2020-11-07 ENCOUNTER — Ambulatory Visit (INDEPENDENT_AMBULATORY_CARE_PROVIDER_SITE_OTHER): Payer: Medicare Other | Admitting: Neurology

## 2020-11-07 ENCOUNTER — Encounter: Payer: Self-pay | Admitting: Neurology

## 2020-11-07 VITALS — BP 150/110 | HR 74 | Ht 70.0 in | Wt 297.0 lb

## 2020-11-07 DIAGNOSIS — G4733 Obstructive sleep apnea (adult) (pediatric): Secondary | ICD-10-CM | POA: Diagnosis not present

## 2020-11-07 DIAGNOSIS — Z91199 Patient's noncompliance with other medical treatment and regimen due to unspecified reason: Secondary | ICD-10-CM

## 2020-11-07 DIAGNOSIS — Z9119 Patient's noncompliance with other medical treatment and regimen: Secondary | ICD-10-CM | POA: Diagnosis not present

## 2020-11-07 NOTE — Progress Notes (Addendum)
SLEEP MEDICINE CLINIC   Provider:  Larey Seat, MD  Primary Care Physician:  [name], MD   Referring Provider: [name], MD  and Tenna Delaine, [place]    Chief Complaint  Patient presents with  . Follow-up    pt alone, rm 10. pt presents due to struggling with issues of using the machine. i attempted a DL and there is only 1/90 days of data. pt states his internet is poor and doesnt think that it has uploaded . He states he has used it off and on. He had been reminded  agreed to bring the BiPAP machine, mask and cables to the visit for refitting and he forgot.  DME [employer] [employer]     RV Jaimen Badman is a 49 y.o. male , seen on 11-07-2020, he follows up for BiPAP compliance:  he has multiple excuses as to why he has effectively not used the machine for 180 days. Has not brought the machine , so I can't see what fits here and what does not.  Poorly prepared and not done t with paperwork after 20 minutes in the room to do that.   The patient underwent a sleep study on 09-15-2019, which confirmed 125 minutes of Hypoxemia and supine AHI of 90/h, overall AHI of 57/h. CPAP was initiated at 5.0 cmH20 with heated humidity per AASM split night standards and pressure was advanced to 15 cmH20. Because of a persistent AHI of 6.7/h the modality was changed to BiPAP: 16/12 and explored through 18/13 cm water where an AHI of 0.8/h was reached.  At that PAP pressure observed were a reduction of the AHI to 0.8, the 02 nadir of 90% and total sleep time of 75 minutes, with improvement of sleep apnea.  Mr. Quito today again endorsed the Epworth Sleepiness Scale at 20 on the fatigue severity scale was not reviewed or endorsed at all.  His compliance report was 1 out of 90 days.  Machine is set at 20/11 cmH2O this for centimeter pressure support this is an air curve 10 of the auto machine with a serial #[id number].  pt presents due to struggling with issues of using the machine.there  is only 1/90 days of data.  pt states his internet is poor and doesnt think that it has uploaded like it should. he states he has used it off and on.   No supplies can be ordered as he is non- complaint.   His brother is now on hemo-dialyisis, he reports he was confused about the billing with DME, didn't get phone calls , has not been able to review his own compliance on his smart phone.       Debbora Presto, NP ,  Vaibhav Colunga admits that he has had difficulty with compliance.  He has recently started using his BiPAP machine but feels that the nasal pillow is uncomfortable.  We will place orders today for a mask refitting.  We have discussed risk of untreated sleep apnea.  I have encouraged him to use BiPAP nightly and for greater than 4 hours each night.  We have also reviewed his blood pressures in the office.  I have advised that he follow-up closely with primary care for management.  He will follow-up with me in 8 weeks, sooner if needed.  He verbalizes understanding and agreement with this plan.     Initially seen on 05-06-2017 he presented after a new consultation referral on 08-18-2019 upon a new referral  from Dr. Carlota Raspberry for a sleep consultation,  As I had described previously Mr. Hoganson had been diagnosed with obstructive sleep apnea at the [facility] sleep center, He underwent a polysomnography on 17 February 2013 the results with an AHI of 32.7/h slight elevation and RDI of 34.3/h, there were more central apneas and obstructive apneas that this is a complex apnea situation.  He did not have PLM's, oxygen nadir was 85% EKG indicated tachycardia for much of the night.  He did not have prolonged hypoxemia.  He returned for a CPAP titration but could not tolerate the [facility] mask which is a full facemask in large size.  He continues to wake up fatigued he has loud and continuous snoring, sometimes he will be choking.  He has often trouble falling asleep, has frequent respiratory  visits sometimes morning headaches sometimes nightmares.  He wakes up with a dry mouth usually.  In addition he has been untreated now for several years but has developed more comorbidities these include an ejection fraction reduction to 25% grade 1 diastolic dysfunction, hypertension, he has been using inhalers as needed for wheezing, he has been on 3 or more drugs to control hypertension and he also has a prescription for as needed hydrocodone. He has used this for migraine and he breaks the tablet in half he reports.  There has also been changes in his social history, he was laid off during the coronavirus pandemic and since May has been unemployed.  He spends more time in bed, he reports insomnia, tossing and turning and some nights he will not go to sleep before 3 AM. His son passed in 2018, killed in a MVA at age 14, he was not the driver. The driver was drunk.   He is interested in seeing if there are alternatives to the CPAP therapy, he has heard about the inspire procedure and device.  However the inspire device has been limited to patients with a BMI of 32 or less and at this time Mr. courts BMI is 44.5.   I would like to add that his blood pressure has been 150s over 111 today in spite of being on a total of 5 blood pressure medications.  Mr. Esguerra was seen here on 05/06/2017 for a sleep consultation.  He had on April 5 been seen at primary care at [facility] and was diagnosed with elevated blood pressure, he has been on antihypertensive medication but needed refills at the time. He has a history of asthma, abnormal EKG, malignant hypertension and migraine headaches with aura as well as obstructive sleep apnea. He was diagnosed with OSA over 4 years ago but has not gotten a CPAP. He needed to be re-referred for a new sleep study.  Mr. Steve is obese, reports lethargy, insomnia, shortness of breath, headaches, witnessed snoring and has frequent apneas, and is currently not gainfully employed, he  presented here today with his mother [name] and is four-year-old nephew.  Sleep habits are as follows: The patient reports that he sleeps every night in bed but takes longer to go to sleep after he goes into the bedroom. He does not have a set bedtime, his bedtime may average 2 AM, always after midnight. His mother describes that he takes . Naps throughout the day and evening while he watches TV, and he watches TV in the bedroom as well. With the TV is switched off he seems to wake up. He seems to sleep in intervals of the maximal 2 hour  duration, and well into the morning hours. For example he may sleep from 7 AM to 9 AM. He takes diuretics and has to go to the bathroom frequently. He also has nocturia before he takes his diuretic in the morning. He reports off and on waking up with headaches, headaches wake him up, too. He wakes always exhausted, unrefreshed. His TV is now turned off after late night news. ..  Brother has CPAP,   Social history: recently again unemployed, father of 72, children live with their mother- girlfriend snores. No tobacco use, no ETOH use, Caffeine use; Coffee 2 a day, iced tea, Soda 2 a day.     Review of Systems: Out of a complete 14 system review, the patient complains of only the following symptoms, and all other reviewed systems are negative.  How likely are you to doze in the following situations: 0 = not likely, 1 = slight chance, 2 = moderate chance, 3 = high chance  Sitting and Reading? Watching Television? Sitting inactive in a public place (theater or meeting)? Lying down in the afternoon when circumstances permit? Sitting and talking to someone? Sitting quietly after lunch without alcohol? In a car, while stopped for a few minutes in traffic? As a passenger in a car for an hour without a break?  Total = 21/ 24 , FSS at 46/ 63 point.   Epworth score 20 , Fatigue severity score 53 , depression score  He lost his son ( 70 )  in a accident.  Grieving.    Social History   Socioeconomic History  . Marital status: Divorced    Spouse name: Not on file  . Number of children: 4  . Years of education: Not on file  . Highest education level: Not on file  Occupational History  . Occupation: Unemployed//disabled  Tobacco Use  . Smoking status: Never Smoker  . Smokeless tobacco: Never Used  Substance and Sexual Activity  . Alcohol use: No    Alcohol/week: 0.0 standard drinks  . Drug use: No  . Sexual activity: Yes    Comment: per pt's blue health form - 1 sex partner in last 12 months  Other Topics Concern  . Not on file  Social History Narrative   Marital status: single; living with fiance      Children: 4 children, no grandchildren.      Employment:  Disability for learning disabilities since 2012.   Right-handed   Caffeine: occasional         Social Determinants of Health   Financial Resource Strain:   . Difficulty of Paying Living Expenses: Not on file  Food Insecurity:   . Worried About Charity fundraiser in the Last Year: Not on file  . Ran Out of Food in the Last Year: Not on file  Transportation Needs:   . Lack of Transportation (Medical): Not on file  . Lack of Transportation (Non-Medical): Not on file  Physical Activity:   . Days of Exercise per Week: Not on file  . Minutes of Exercise per Session: Not on file  Stress:   . Feeling of Stress : Not on file  Social Connections:   . Frequency of Communication with Friends and Family: Not on file  . Frequency of Social Gatherings with Friends and Family: Not on file  . Attends Religious Services: Not on file  . Active Member of Clubs or Organizations: Not on file  . Attends Archivist Meetings: Not on file  .  Marital Status: Not on file  Intimate Partner Violence:   . Fear of Current or Ex-Partner: Not on file  . Emotionally Abused: Not on file  . Physically Abused: Not on file  . Sexually Abused: Not on file    Family History  Problem  Relation Age of Onset  . Cancer Father        prostate, colon- age was in his 19's  . Heart disease Father 3       MI   . Hypertension Father   . Colon cancer Father   . Diabetes Mother   . Hypertension Mother   . Hypertension Brother   . Cancer Maternal Grandfather   . Cancer Paternal Grandfather   . Esophageal cancer Neg Hx   . Stomach cancer Neg Hx   . Rectal cancer Neg Hx     Past Medical History:  Diagnosis Date  . Anxiety   . Depression    Questionable per records. Not on any medication.   Marland Kitchen History of kidney stones   . Hx of echocardiogram    a. Echo 12/13/12: Mild LVH, EF 51-70%, grade 1 diastolic dysfunction, mild LAE, mild RVE, mild RAE  . Hypertension   . Insomnia   . Migraine   . Noncompliance   . OSA (obstructive sleep apnea)    a. Sleep Study 02/2013:  mod OSA, AHI 33 per hour, O2 sat nadir 85%    Past Surgical History:  Procedure Laterality Date  . Admission  04/2012   Malignant HTN, HA.  CT head negative, cardiology consult.    . CYSTOSCOPY WITH RETROGRADE PYELOGRAM, URETEROSCOPY AND STENT PLACEMENT Left 01/24/2019   Procedure: CYSTOSCOPY WITH RETROGRADE PYELOGRAM, URETEROSCOPY AND STENT PLACEMENT;  Surgeon: Cleon Gustin, MD;  Location: [facility];  Service: Urology;  Laterality: Left;  . HOLMIUM LASER APPLICATION Left 0/17/4944   Procedure: HOLMIUM LASER APPLICATION;  Surgeon: Cleon Gustin, MD;  Location[facility];  Service: Urology;  Laterality: Left;  Marland Kitchen MANDIBLE SURGERY  1998   metal plate    Current Outpatient Medications  Medication Sig Dispense Refill  . amLODipine (NORVASC) 10 MG tablet Take 1 tablet by mouth every day 90 tablet 1  . buPROPion (WELLBUTRIN XL) 150 MG 24 hr tablet Take 1 tablet by mouth every day 90 tablet 1  . carvedilol (COREG) 6.25 MG tablet Take 6.25 mg by mouth 2 (two) times daily.    . chlorthalidone (HYGROTON) 50 MG tablet Take 1 tablet (50 mg total) by mouth daily. 30 tablet 1   . lisinopril (ZESTRIL) 40 MG tablet TAKE 1 TABLET(40 MG) BY MOUTH DAILY 90 tablet 0  . meloxicam (MOBIC) 7.5 MG tablet Take 1 tablet (7.5 mg total) by mouth daily. 30 tablet 0  . potassium chloride (KLOR-CON) 10 MEQ tablet Take 1 tablet by mouth every day 90 tablet 3   Current Facility-Administered Medications  Medication Dose Route Frequency Provider Last Rate Last Admin  . 0.9 %  sodium chloride infusion  500 mL Intravenous Continuous Ladene Artist, MD        Allergies as of 11/07/2020 - Review Complete 11/07/2020  Allergen Reaction Noted  . Statins Other (See Comments) 04/14/2017    Vitals: BP (!) 150/110   Pulse 74   Ht _0  (1.778 m)   Wt 297 lb (134.7 kg)   BMI 42.62 kg/m  Last Weight:  Wt Readings from Last 1 Encounters:  11/07/20 297 lb (134.7 kg)   HQP:RFFM  mass index is 42.62 kg/m.     Last Height:   Ht Readings from Last 1 Encounters:  11/07/20 _0  (1.778 m)    Physical exam:  General: The patient is awake, alert and appears not in acute distress.  Head: Normocephalic, atraumatic. Neck is supple. Mallampati 2, no evidence of tonsils . neck circumference:17.75. Nasal airflow patent  TMJ is not evident . Retrognathia is seen.  Cardiovascular:  Regular rate and rhythm , without  murmurs or carotid bruit, and without distended neck veins. Respiratory: Lungs are clear to auscultation. Skin:  Without evidence of edema, or rash Trunk: BMI is morbidly obese at 44.5 kg/m2 . The patient's posture is erect .  Neurologic exam : The patient is awake and alert, oriented to place and time. He has a lifelong history of learning disabilities.  Memory subjective described as intact. Attention span & concentration ability appears normal. Speech is fluent,  without dysarthria, dysphonia or aphasia. Mood and affect are appropriate.  Cranial nerves: Pupils are equal and briskly reactive to light. Funduscopic exam without  evidence of pallor or edema. Extraocular movements   in vertical and horizontal planes intact and without nystagmus. Visual fields by finger perimetry are intact.Hearing to finger rub intact.  Facial sensation intact to fine touch. Facial motor strength is symmetric and tongue and uvula move midline. Shoulder shrug was symmetrical.   Motor exam: Normal tone, muscle bulk and symmetric strength in all extremities. Sensory:  Fine touch, pinprick and vibration were tested in all extremities. Proprioception tested in the upper extremities was normal. Coordination: Rapid alternating movements in the fingers/hands was normal. Finger-to-nose maneuver  normal without evidence of ataxia, dysmetria or tremor. Gait and station: Patient walks without assistive device and is able unassisted to climb up to the exam table. Strength within normal limits. Stance is stable and normal.   Deep tendon reflexes: in the  upper and lower extremities are symmetric and intact. Babinski maneuver response is downgoing.   Mr.Rodriguez's  original sleep study more than 6 years ago took place at [facility]- He explains that CPAP titration took place but he could not tolerate a full face mask covering mouth and nose, and felt suffocating. He had been instructed that he needs to use CPAP 4 hours each night and could not comply with this requirement due to intolerance-claustrophobia.  Since he learned that he had apnea he has not been treated in any other fashion for it. He was asked to return for a sleep study but didn't ( 2 years ago0 . I am worried about central sleep apnea, having reviewed the previous study. .   I will order a split night polysomnography with desensitization, my goal is to have the patient not be apprehensive about the mask and to use something that is as small as he can possibly tolerate without sacrificing efficacy.  Assessment:  After physical and neurologic examination, review of laboratory studies,  Personal review of imaging studies, reports of other /same   Imaging studies, results of polysomnography and / or neurophysiology testing and pre-existing records as far as provided in visit., my assessment is ;   1)  Complex Sleep apnea was present in 2014 , at the time on opiate pain meds.  He now has SEVERE OSA with HYPOXEMIA- and CHF grade 1.  BIPAP was titrated and effective in controlling both - hypoxia and OSA, yet he has not used his machine. This is the second time he has not been compliant and  I can no longer follow him in the sleep clinic.  This has been ongoing problem ever since we first met. 2018.   2)  Insomnia with anxiety- claustrophobia.  He cannot tolerate CPAP- especially not with a FFM, but we provided him with a nasal mask, that was much better.  Still did not comply.  3)   morbid obesity, this BMI excludes him for INSPIRE treatment . He is willing to lose weight, but needs guidance.  4)  uncontrolled malignant hypertension. 5 antihypertensive meds at this point    The patient was advised of the nature of the diagnosed disorder , the treatment options and the  risks for general health and wellness arising from not treating the condition.   I spent more than 15 minutes of face to face time with the patient.  Greater than 50% of time was spent in counseling and coordination of care. We have discussed the diagnosis and differential and I answered the patient's questions.    Plan:  Treatment plan and additional workup : we can not follow up on compliance if there is none and the patient doesn't communicate with [place] at all.  If he uses his machine compliantly for 30 days and alerts [place] to get data download, I would refill supplies for him.      Larey Seat, MD 00/44/7158, 06:38 AM  Certified in Neurology by ABPN Certified in [place] by [facility] [address], [place], [place] [address]

## 2020-11-08 ENCOUNTER — Ambulatory Visit: Payer: Medicare Other | Admitting: Family Medicine

## 2020-11-15 ENCOUNTER — Other Ambulatory Visit: Payer: Self-pay | Admitting: Family Medicine

## 2020-11-15 DIAGNOSIS — I10 Essential (primary) hypertension: Secondary | ICD-10-CM

## 2020-11-16 ENCOUNTER — Emergency Department (HOSPITAL_COMMUNITY)
Admission: EM | Admit: 2020-11-16 | Discharge: 2020-11-16 | Disposition: A | Discharge status: Home / Self Care | Payer: Medicare Other | Attending: Emergency Medicine | Admitting: Emergency Medicine

## 2020-11-16 ENCOUNTER — Emergency Department (HOSPITAL_COMMUNITY): Payer: Medicare Other

## 2020-11-16 ENCOUNTER — Other Ambulatory Visit: Payer: Self-pay

## 2020-11-16 ENCOUNTER — Encounter (HOSPITAL_COMMUNITY): Payer: Self-pay

## 2020-11-16 DIAGNOSIS — G43909 Migraine, unspecified, not intractable, without status migrainosus: Secondary | ICD-10-CM | POA: Insufficient documentation

## 2020-11-16 DIAGNOSIS — G4489 Other headache syndrome: Secondary | ICD-10-CM | POA: Diagnosis not present

## 2020-11-16 DIAGNOSIS — J45909 Unspecified asthma, uncomplicated: Secondary | ICD-10-CM | POA: Diagnosis not present

## 2020-11-16 DIAGNOSIS — R519 Headache, unspecified: Secondary | ICD-10-CM | POA: Diagnosis not present

## 2020-11-16 DIAGNOSIS — H5319 Other subjective visual disturbances: Secondary | ICD-10-CM | POA: Diagnosis not present

## 2020-11-16 DIAGNOSIS — I160 Hypertensive urgency: Secondary | ICD-10-CM | POA: Diagnosis not present

## 2020-11-16 DIAGNOSIS — R6889 Other general symptoms and signs: Secondary | ICD-10-CM | POA: Diagnosis not present

## 2020-11-16 DIAGNOSIS — I110 Hypertensive heart disease with heart failure: Secondary | ICD-10-CM | POA: Diagnosis not present

## 2020-11-16 DIAGNOSIS — I5031 Acute diastolic (congestive) heart failure: Secondary | ICD-10-CM | POA: Diagnosis not present

## 2020-11-16 DIAGNOSIS — Z79899 Other long term (current) drug therapy: Secondary | ICD-10-CM | POA: Diagnosis not present

## 2020-11-16 DIAGNOSIS — R0602 Shortness of breath: Secondary | ICD-10-CM | POA: Diagnosis not present

## 2020-11-16 DIAGNOSIS — I499 Cardiac arrhythmia, unspecified: Secondary | ICD-10-CM | POA: Diagnosis not present

## 2020-11-16 DIAGNOSIS — Z743 Need for continuous supervision: Secondary | ICD-10-CM | POA: Diagnosis not present

## 2020-11-16 LAB — URINALYSIS, ROUTINE W REFLEX MICROSCOPIC
Bilirubin Urine: NEGATIVE
Glucose, UA: NEGATIVE mg/dL
Hgb urine dipstick: NEGATIVE
Ketones, ur: NEGATIVE mg/dL
Leukocytes,Ua: NEGATIVE
Nitrite: NEGATIVE
Protein, ur: NEGATIVE mg/dL
Specific Gravity, Urine: 1.024 (ref 1.005–1.030)
pH: 6 (ref 5.0–8.0)

## 2020-11-16 LAB — CBC WITH DIFFERENTIAL/PLATELET
Abs Immature Granulocytes: 0.01 10*3/uL (ref 0.00–0.07)
Basophils Absolute: 0 10*3/uL (ref 0.0–0.1)
Basophils Relative: 1 %
Eosinophils Absolute: 0 10*3/uL (ref 0.0–0.5)
Eosinophils Relative: 1 %
HCT: 44.2 % (ref 39.0–52.0)
Hemoglobin: 14.2 g/dL (ref 13.0–17.0)
Immature Granulocytes: 0 %
Lymphocytes Relative: 35 %
Lymphs Abs: 1.8 10*3/uL (ref 0.7–4.0)
MCH: 28.7 pg (ref 26.0–34.0)
MCHC: 32.1 g/dL (ref 30.0–36.0)
MCV: 89.3 fL (ref 80.0–100.0)
Monocytes Absolute: 0.2 10*3/uL (ref 0.1–1.0)
Monocytes Relative: 4 %
Neutro Abs: 3 10*3/uL (ref 1.7–7.7)
Neutrophils Relative %: 59 %
Platelets: 207 10*3/uL (ref 150–400)
RBC: 4.95 MIL/uL (ref 4.22–5.81)
RDW: 15.6 % — ABNORMAL HIGH (ref 11.5–15.5)
WBC: 5 10*3/uL (ref 4.0–10.5)
nRBC: 0 % (ref 0.0–0.2)

## 2020-11-16 LAB — COMPREHENSIVE METABOLIC PANEL
ALT: 15 U/L (ref 0–44)
AST: 17 U/L (ref 15–41)
Albumin: 4.1 g/dL (ref 3.5–5.0)
Alkaline Phosphatase: 55 U/L (ref 38–126)
Anion gap: 10 (ref 5–15)
BUN: 13 mg/dL (ref 6–20)
CO2: 27 mmol/L (ref 22–32)
Calcium: 8.8 mg/dL — ABNORMAL LOW (ref 8.9–10.3)
Chloride: 103 mmol/L (ref 98–111)
Creatinine, Ser: 0.94 mg/dL (ref 0.61–1.24)
GFR, Estimated: >60 mL/min (ref >60)
Glucose, Bld: 116 mg/dL — ABNORMAL HIGH (ref 70–99)
Potassium: 3.6 mmol/L (ref 3.5–5.1)
Sodium: 140 mmol/L (ref 135–145)
Total Bilirubin: 0.7 mg/dL (ref 0.3–1.2)
Total Protein: 7.6 g/dL (ref 6.5–8.1)

## 2020-11-16 LAB — RAPID URINE DRUG SCREEN, HOSP PERFORMED
Amphetamines: NOT DETECTED
Barbiturates: NOT DETECTED
Benzodiazepines: NOT DETECTED
Cocaine: NOT DETECTED
Opiates: NOT DETECTED
Tetrahydrocannabinol: NOT DETECTED

## 2020-11-16 LAB — SEDIMENTATION RATE: Sed Rate: 12 mm/hr (ref 0–16)

## 2020-11-16 LAB — MAGNESIUM: Magnesium: 2.1 mg/dL (ref 1.7–2.4)

## 2020-11-16 LAB — PROTIME-INR
INR: 1.1 (ref 0.8–1.2)
Prothrombin Time: 13.3 seconds (ref 11.4–15.2)

## 2020-11-16 LAB — C-REACTIVE PROTEIN: CRP: 2.2 mg/dL — ABNORMAL HIGH (ref <1.0)

## 2020-11-16 IMAGING — CT CT ANGIO NECK
2 of 7 series · 8 of 33 positions shown · IV contrast (omnipaque)
Comparison: Head CT earlier same day.

CLINICAL DATA: Acute onset of headache and vertigo.

EXAM:
CT ANGIOGRAPHY HEAD AND NECK
TECHNIQUE: Multidetector CT imaging of the head and neck was performed using
the standard protocol during bolus administration of intravenous
contrast. Multiplanar CT image reconstructions and MIPs were
obtained to evaluate the vascular anatomy. Carotid stenosis
measurements (when applicable) are obtained utilizing NASCET
criteria, using the distal internal carotid diameter as the
denominator.
CONTRAST:  75mL OMNIPAQUE IOHEXOL 350 MG/ML SOLN

[Series 6: cta head neck · axial · 0.53mm/px · z∈[-275,-147]mm · 2 of 193 slices shown]
[im 65/193  soft-tissue]
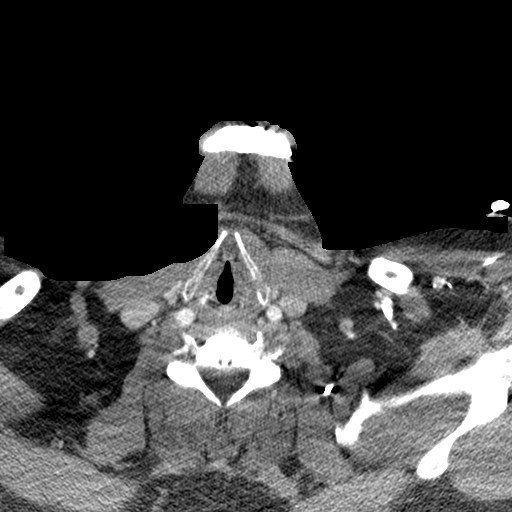
[im 129/193  soft-tissue]
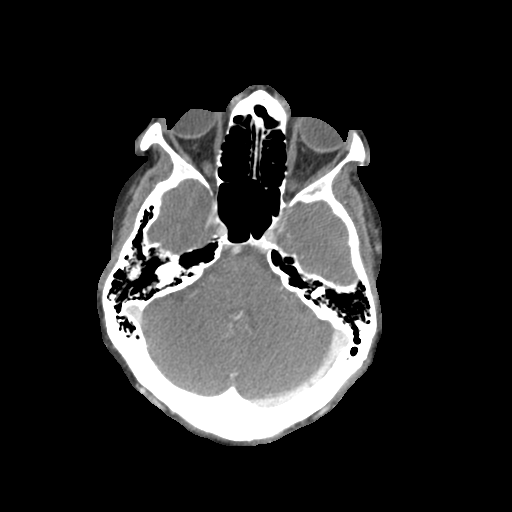

[Series 8: ax thin · axial · 0.44mm/px · z∈[-348,-73]mm · 6 of 385 slices shown]
[im 55/385  soft-tissue]
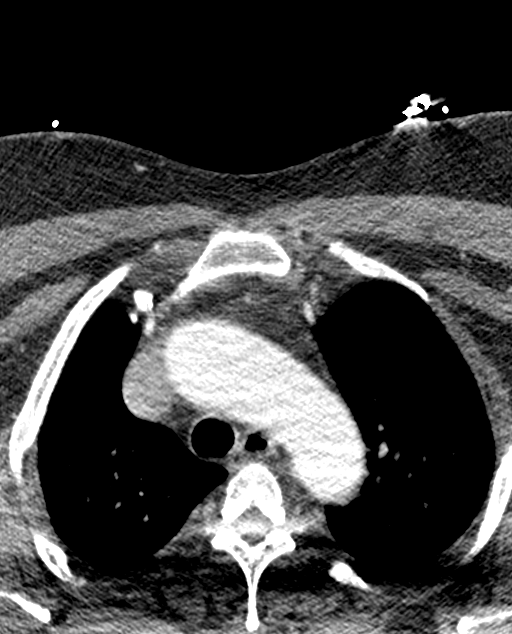
[im 110/385  bone]
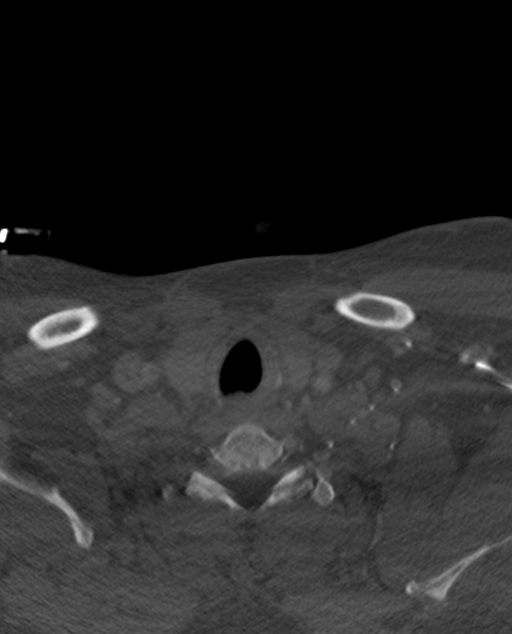
[im 165/385  soft-tissue]
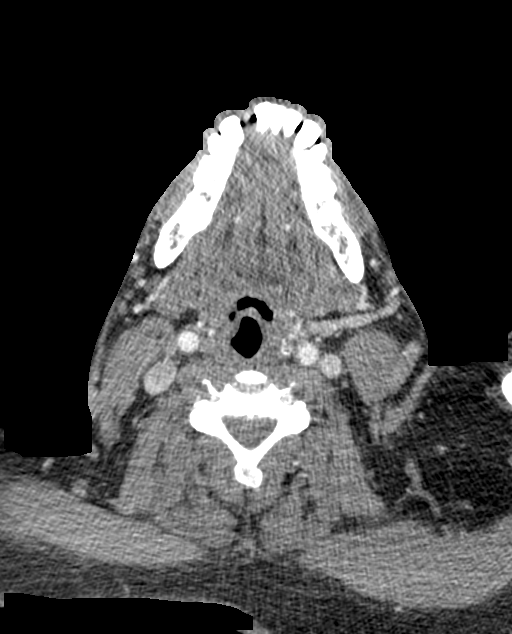
[im 220/385  bone]
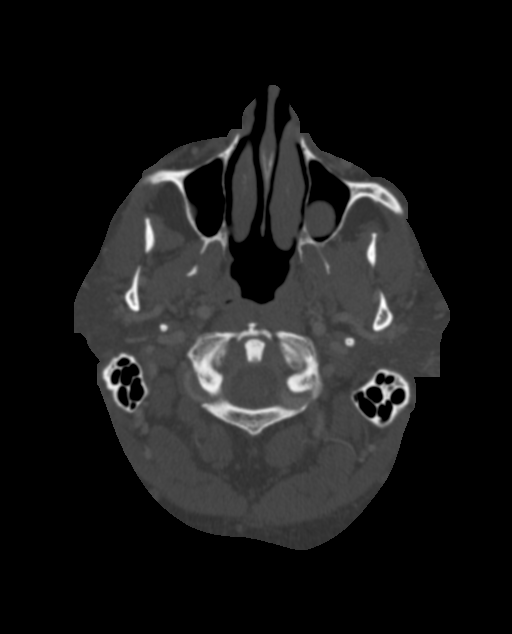
[im 275/385  soft-tissue]
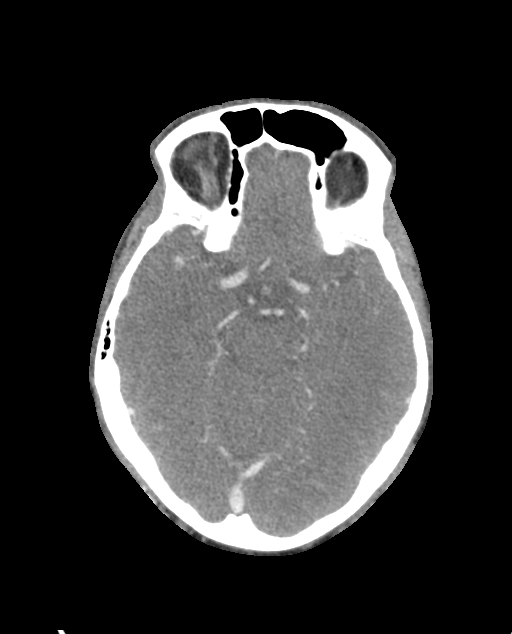
[im 330/385  bone]
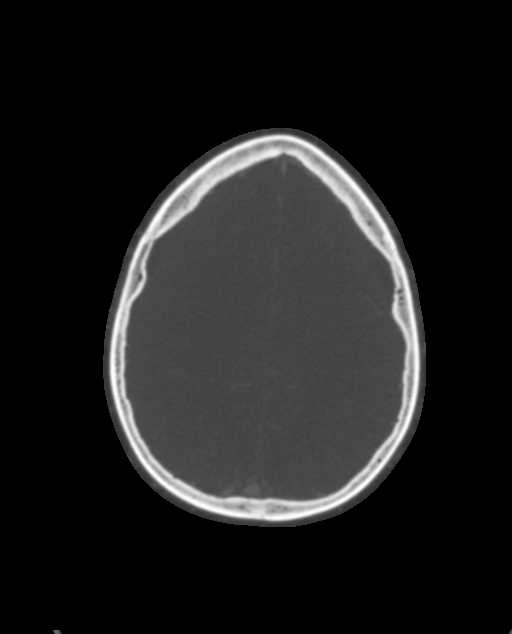

[8 of 33 positions shown; findings below may reference images not displayed]

FINDINGS: CTA NECK FINDINGS

Aortic arch: Mild ectasia of the aorta but no atherosclerotic
calcification, aneurysm or dissection.

Right carotid system: Common carotid artery widely patent to the
bifurcation. Carotid bifurcation is normal without soft or calcified
plaque. Cervical ICA widely patent.

Left carotid system: Common carotid artery widely patent to the
bifurcation. Carotid bifurcation is normal without soft or calcified
plaque. Cervical ICA is normal.

Vertebral arteries: Both vertebral artery origins are patent. No
origin plaque or stenosis. Left vertebral artery is dominant. Both
vertebral arteries appear normal through the cervical region to the
foramen magnum.

Skeleton: Minimal cervical spondylosis.

Other neck: No soft tissue mass or lymphadenopathy.  Normal

Upper chest: Normal

Review of the MIP images confirms the above findings

CTA HEAD FINDINGS

Anterior circulation: Both internal carotid arteries are patent
through the skull base and siphon regions. No siphon stenosis. The
anterior and middle cerebral vessels are patent without proximal
stenosis, aneurysm vascular malformation. No large or medium vessel
occlusion.

Posterior circulation: Vertebral arteries are patent at the foramen
magnum. The right terminates in PICA. The left supplies the basilar.
No basilar stenosis. Posterior circulation branch vessels are
patent. Patent bilateral posterior communicating arteries.

Venous sinuses: Patent and normal.

Anatomic variants: None significant.

Review of the MIP images confirms the above findings
IMPRESSION: 1. Negative CT angiography of the neck and head. No large or medium
vessel occlusion or correctable proximal stenosis. No intracranial
aneurysm or vascular malformation.
2. Mild ectasia of the aortic arch but without atherosclerotic
calcification, aneurysm or dissection.

## 2020-11-16 IMAGING — CT CT ANGIO HEAD
1 of 11 series · 4 of 33 positions shown · IV contrast (omnipaque)
Comparison: Head CT earlier same day.

CLINICAL DATA: Acute onset of headache and vertigo.

EXAM:
CT ANGIOGRAPHY HEAD AND NECK
TECHNIQUE: Multidetector CT imaging of the head and neck was performed using
the standard protocol during bolus administration of intravenous
contrast. Multiplanar CT image reconstructions and MIPs were
obtained to evaluate the vascular anatomy. Carotid stenosis
measurements (when applicable) are obtained utilizing NASCET
criteria, using the distal internal carotid diameter as the
denominator.
CONTRAST:  75mL OMNIPAQUE IOHEXOL 350 MG/ML SOLN

[Series 7: cta head neck thins · axial · 0.53mm/px · z∈[-326,-96]mm · 4 of 769 slices shown]
[im 154/769  soft-tissue]
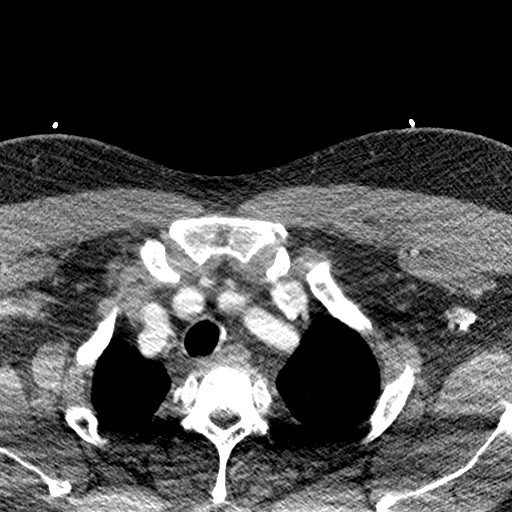
[im 308/769  bone]
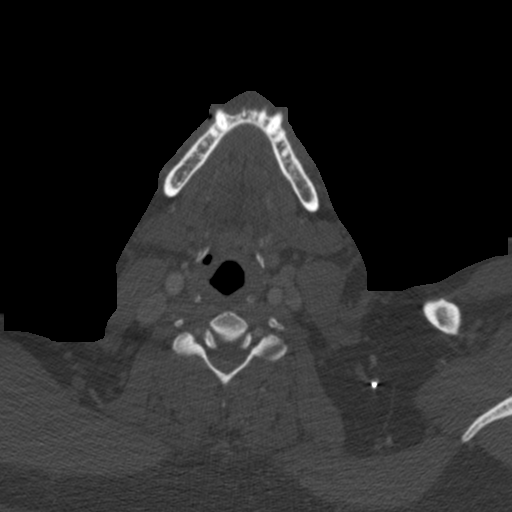
[im 461/769  soft-tissue]
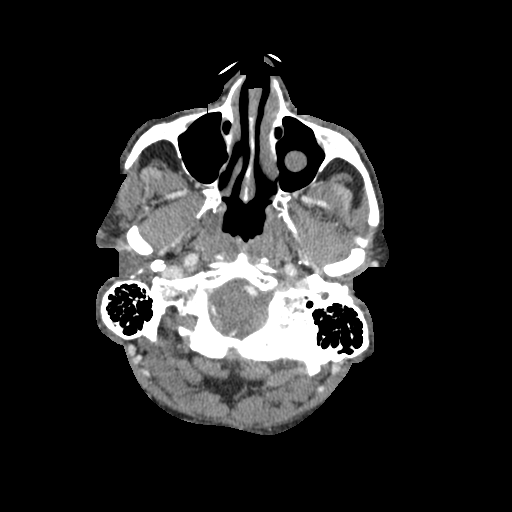
[im 615/769  bone]
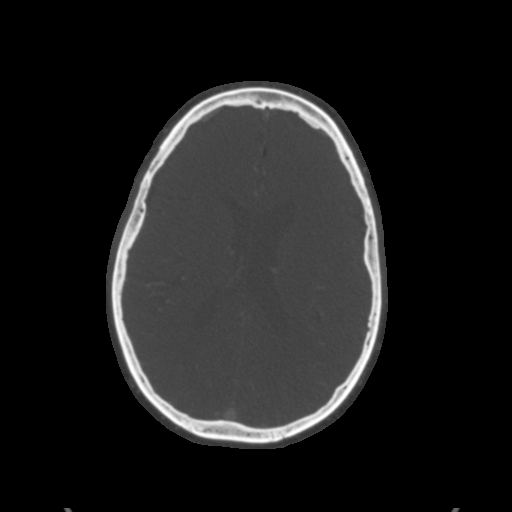

[4 of 33 positions shown; findings below may reference images not displayed]

FINDINGS: CTA NECK FINDINGS

Aortic arch: Mild ectasia of the aorta but no atherosclerotic
calcification, aneurysm or dissection.

Right carotid system: Common carotid artery widely patent to the
bifurcation. Carotid bifurcation is normal without soft or calcified
plaque. Cervical ICA widely patent.

Left carotid system: Common carotid artery widely patent to the
bifurcation. Carotid bifurcation is normal without soft or calcified
plaque. Cervical ICA is normal.

Vertebral arteries: Both vertebral artery origins are patent. No
origin plaque or stenosis. Left vertebral artery is dominant. Both
vertebral arteries appear normal through the cervical region to the
foramen magnum.

Skeleton: Minimal cervical spondylosis.

Other neck: No soft tissue mass or lymphadenopathy.  Normal

Upper chest: Normal

Review of the MIP images confirms the above findings

CTA HEAD FINDINGS

Anterior circulation: Both internal carotid arteries are patent
through the skull base and siphon regions. No siphon stenosis. The
anterior and middle cerebral vessels are patent without proximal
stenosis, aneurysm vascular malformation. No large or medium vessel
occlusion.

Posterior circulation: Vertebral arteries are patent at the foramen
magnum. The right terminates in PICA. The left supplies the basilar.
No basilar stenosis. Posterior circulation branch vessels are
patent. Patent bilateral posterior communicating arteries.

Venous sinuses: Patent and normal.

Anatomic variants: None significant.

Review of the MIP images confirms the above findings
IMPRESSION: 1. Negative CT angiography of the neck and head. No large or medium
vessel occlusion or correctable proximal stenosis. No intracranial
aneurysm or vascular malformation.
2. Mild ectasia of the aortic arch but without atherosclerotic
calcification, aneurysm or dissection.

## 2020-11-16 IMAGING — CT CT HEAD W/O CM
3 series · 16 of 47 positions shown, 19 images · non-contrast
Comparison: [DATE]

CLINICAL DATA: Headache, hypertension

EXAM:
CT HEAD WITHOUT CONTRAST
TECHNIQUE: Contiguous axial images were obtained from the base of the skull
through the vertex without intravenous contrast.

[Series 2: head wo · axial · 0.48mm/px · z∈[-115,+20]mm · 10 of 33 slices shown, 13 images]
[im 3/33  brain]
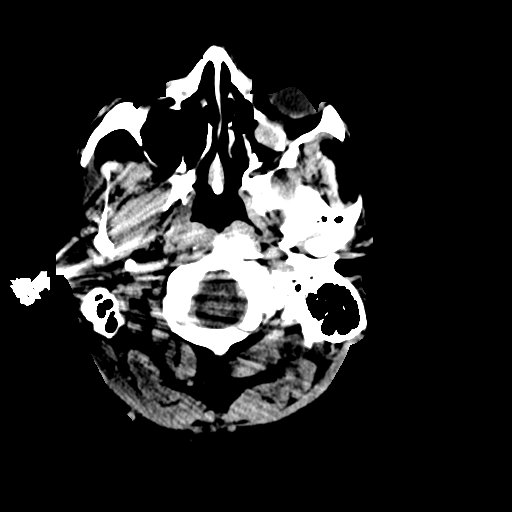
[im 3/33  bone]
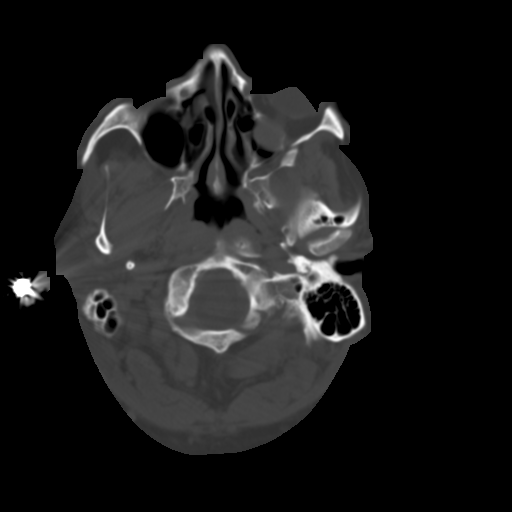
[im 6/33  brain]
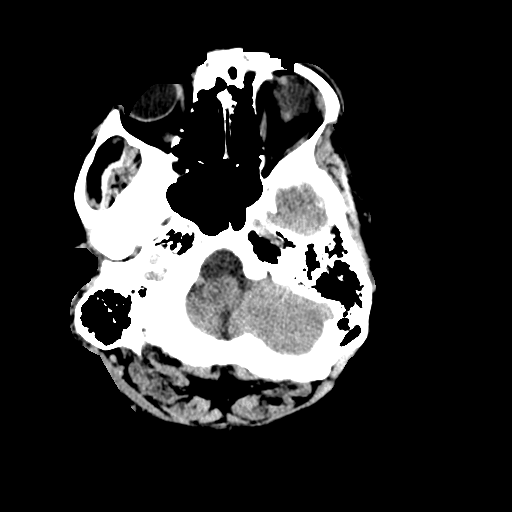
[im 9/33  brain]
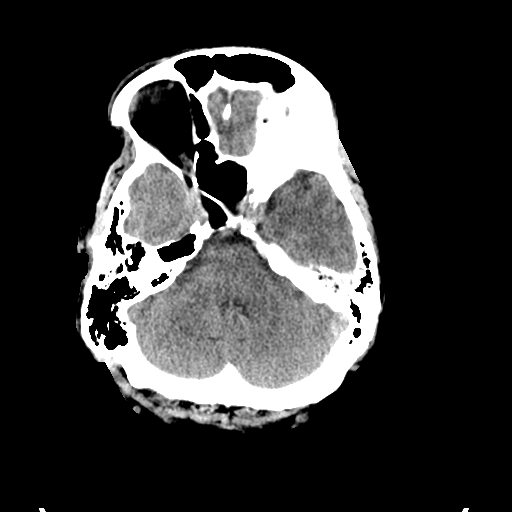
[im 12/33  brain]
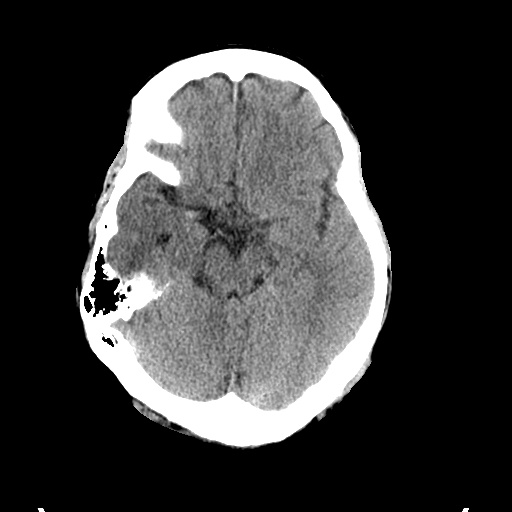
[im 15/33  brain]
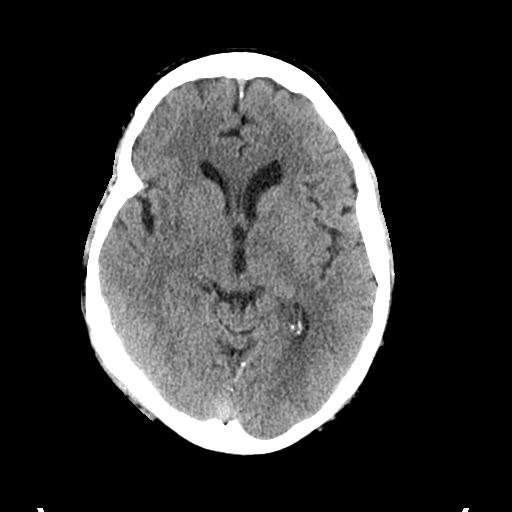
[im 15/33  bone]
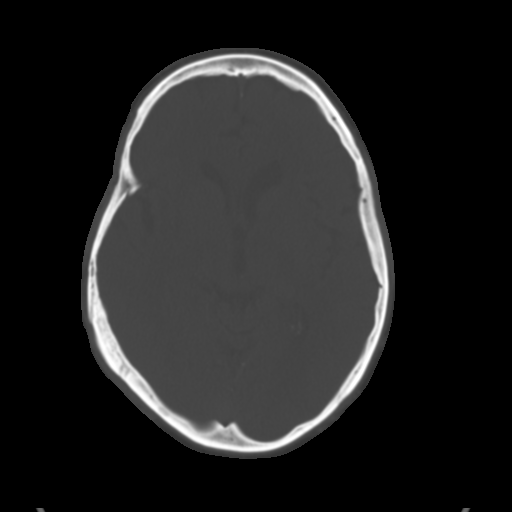
[im 18/33  brain]
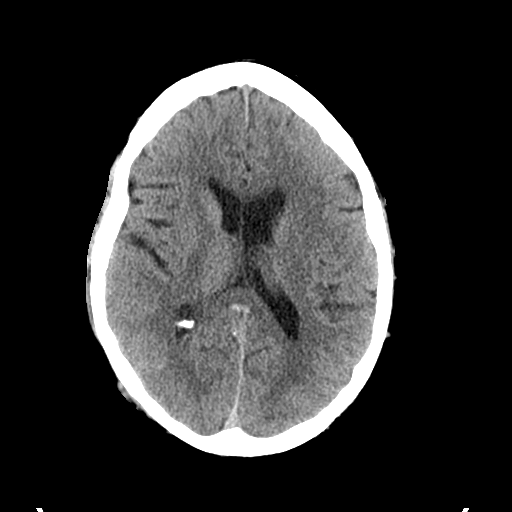
[im 21/33  brain]
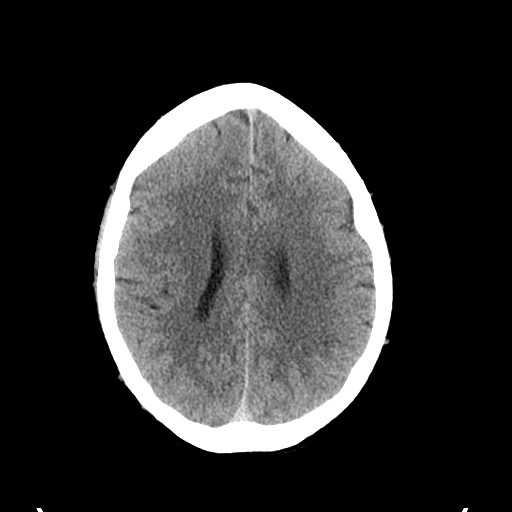
[im 25/33  brain]
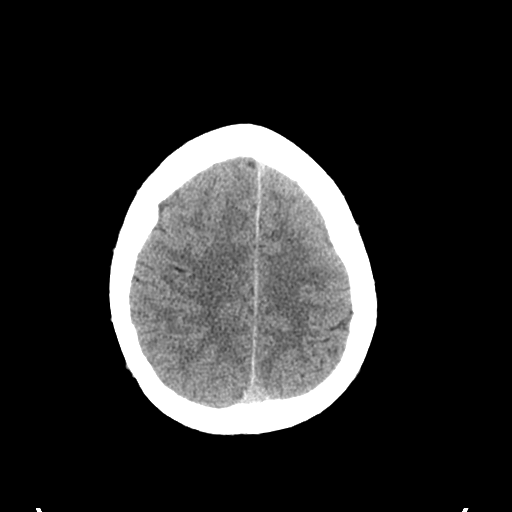
[im 27/33  brain]
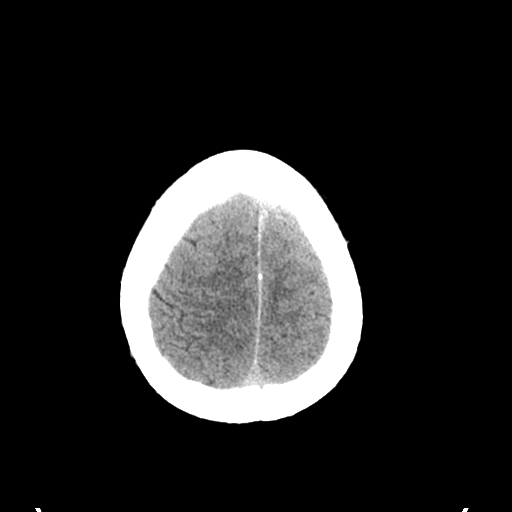
[im 27/33  bone]
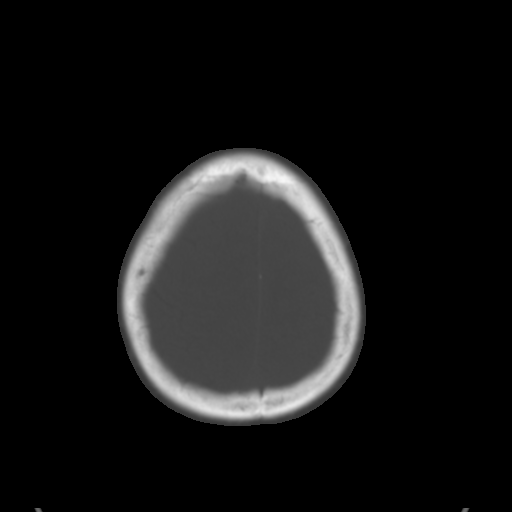
[im 30/33  brain]
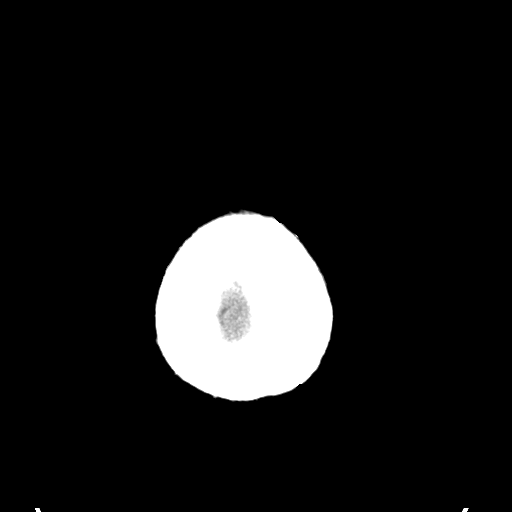

[Series 4: coronal soft tissue · coronal · 0.35mm/px · 3 of 81 slices shown]
[im 27/81  brain]
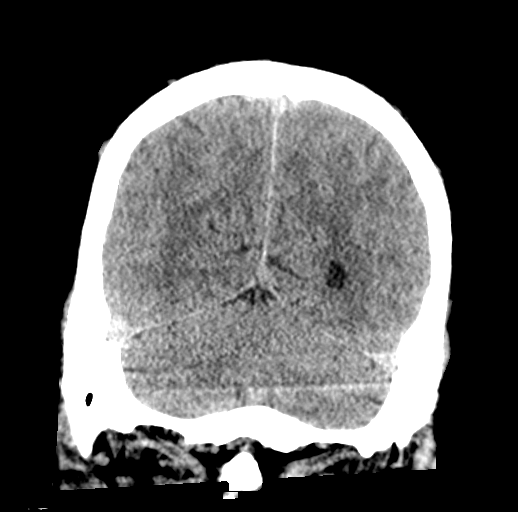
[im 36/81  brain]
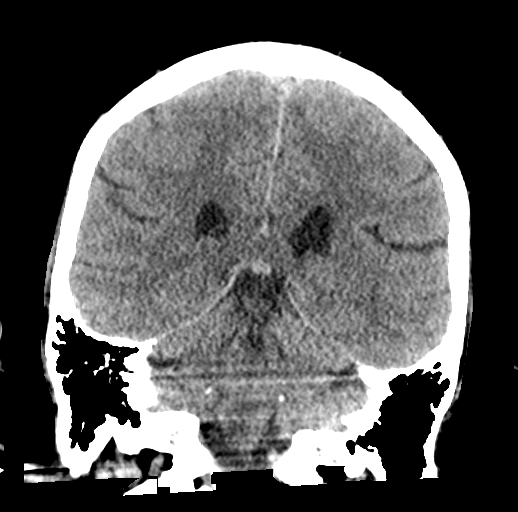
[im 45/81  brain]
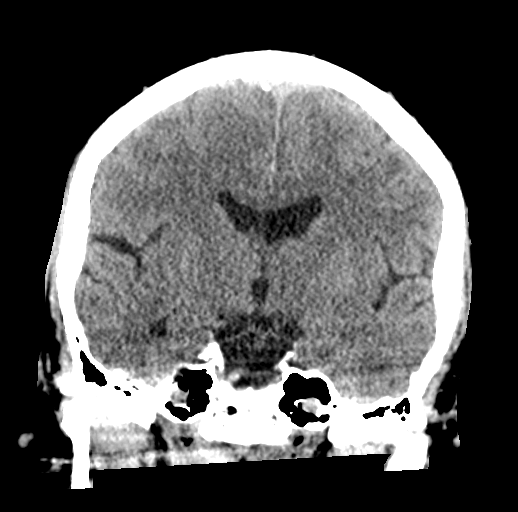

[Series 5: sagittal soft tissue · sagittal · 0.37mm/px · 3 of 61 slices shown]
[im 21/61  brain]
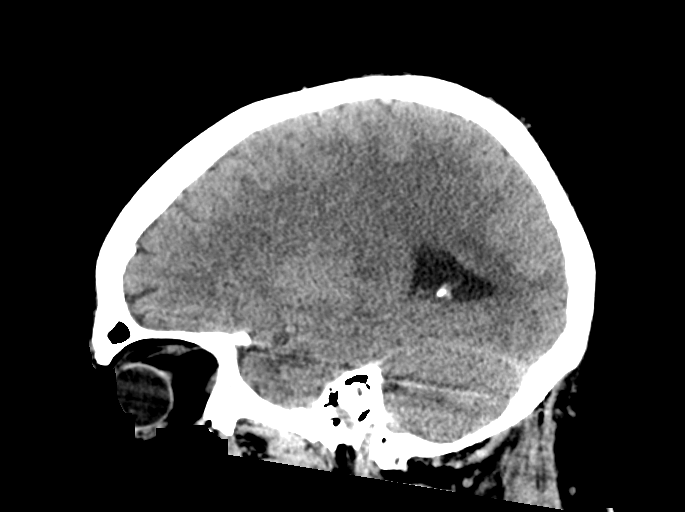
[im 31/61  brain]
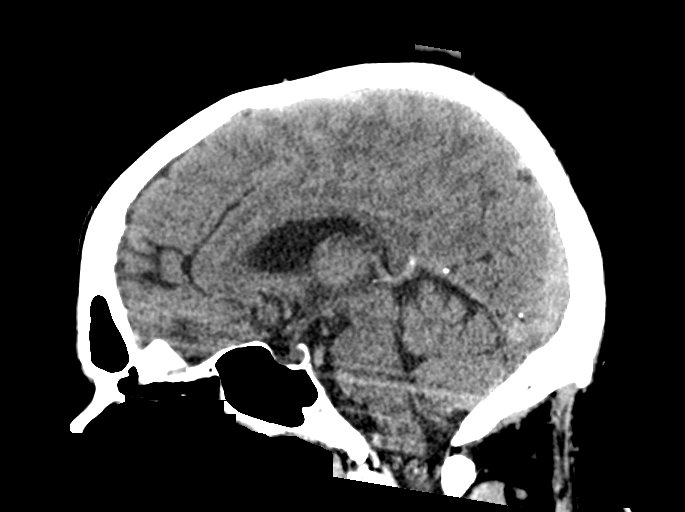
[im 41/61  brain]
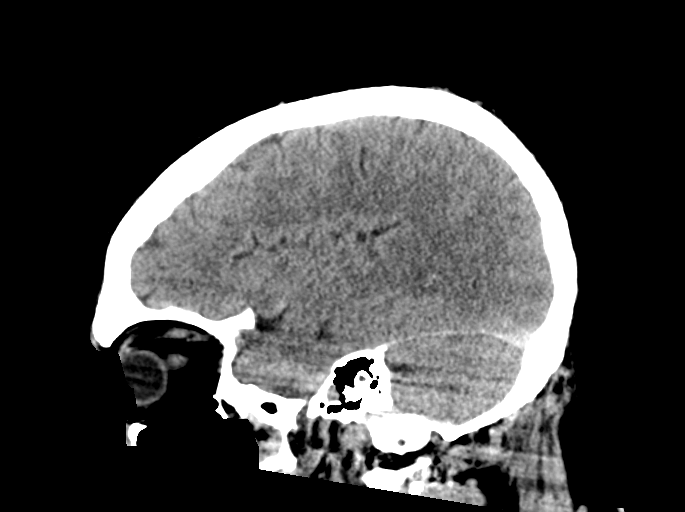

[16 of 47 positions shown; findings below may reference images not displayed]

FINDINGS: Brain: No evidence of acute infarction, hemorrhage, hydrocephalus,
extra-axial collection or mass lesion/mass effect.

Vascular: No hyperdense vessel or unexpected calcification.

Skull: Normal. Negative for fracture or focal lesion.

Sinuses/Orbits: Mucous retention cyst within the left maxillary
sinus. Paranasal sinuses are otherwise clear. Mastoid air cells are
clear. Orbital structures unremarkable.

Other: None.
IMPRESSION: No acute intracranial findings.

## 2020-11-16 MED ORDER — HYDRALAZINE HCL 20 MG/ML IJ SOLN
10.0000 mg | Freq: Once | INTRAMUSCULAR | Status: AC
  Administered 2020-11-16: 10 mg via INTRAVENOUS
  Filled 2020-11-16: qty 1

## 2020-11-16 MED ORDER — AMLODIPINE BESYLATE 5 MG PO TABS
10.0000 mg | ORAL_TABLET | Freq: Once | ORAL | Status: AC
  Administered 2020-11-16: 10 mg via ORAL
  Filled 2020-11-16: qty 2

## 2020-11-16 MED ORDER — HYDRALAZINE HCL 20 MG/ML IJ SOLN
20.0000 mg | Freq: Once | INTRAMUSCULAR | Status: AC
  Administered 2020-11-16: 20 mg via INTRAVENOUS
  Filled 2020-11-16: qty 1

## 2020-11-16 MED ORDER — METOCLOPRAMIDE HCL 5 MG/ML IJ SOLN
10.0000 mg | Freq: Once | INTRAMUSCULAR | Status: AC
  Administered 2020-11-16: 10 mg via INTRAVENOUS
  Filled 2020-11-16: qty 2

## 2020-11-16 MED ORDER — LISINOPRIL 20 MG PO TABS
40.0000 mg | ORAL_TABLET | Freq: Once | ORAL | Status: AC
  Administered 2020-11-16: 40 mg via ORAL
  Filled 2020-11-16: qty 2

## 2020-11-16 MED ORDER — DIPHENHYDRAMINE HCL 50 MG/ML IJ SOLN
25.0000 mg | Freq: Once | INTRAMUSCULAR | Status: AC
  Administered 2020-11-16: 25 mg via INTRAVENOUS
  Filled 2020-11-16: qty 1

## 2020-11-16 MED ORDER — IOHEXOL 350 MG/ML SOLN
75.0000 mL | Freq: Once | INTRAVENOUS | Status: AC | PRN
  Administered 2020-11-16: 75 mL via INTRAVENOUS

## 2020-11-16 MED ORDER — LACTATED RINGERS IV SOLN: INTRAVENOUS | Status: DC

## 2020-11-16 MED ORDER — SODIUM CHLORIDE (PF) 0.9 % IJ SOLN
INTRAMUSCULAR | Status: AC
  Filled 2020-11-16: qty 50

## 2020-11-16 NOTE — ED Notes (Signed)
Pt states chest tightness with shob. Updated provided. Pt provided urinal due to needing to urinate

## 2020-11-16 NOTE — ED Triage Notes (Signed)
Headache since 2200. This am upon waking weakness, shob, headache. Hx HTN. BP 210/110 on ems arrival. Pt self administered 650mg  tylenol @ 0700

## 2020-11-16 NOTE — Discharge Instructions (Signed)
1.  Your blood pressure was very elevated when you came into the emergency department.  This may be a combination of not having taken your medications today and being in pain when you arrive.  It is vitally important that you keep a journal of your blood pressures.  Checks them 2-3 times a day and write them down daily.  Sure you are taking your medications as prescribed.  Your blood pressure continues to be elevated despite taking your blood pressure medications you must see your doctor for adjustment of your medications. 2.  Call your family doctor to schedule a recheck within the next 3 to 5 days. 3.  Return to the emergency department if you have recurrence of bad headache, confusion, focal weakness numbness or tingling of the arms or legs, difficulty walking or other concerning symptoms.

## 2020-11-16 NOTE — ED Provider Notes (Signed)
[facility] DEPT Provider Note   CSN: [id number] Arrival date & time: 11/16/20  1008     History Chief Complaint  Patient presents with   Headache   Hypertension    Michael [name] is a 49 y.o. male.  HPI Patient reports he has a history of headaches.  He reports he typically has a headache that occurs on the left side of his head.  And is usually sharp and focal pain that might also involve his ear.  He reports it often seems to happen around the holidays.  He reports his headache has been going on and off for a number of months.  He reports this episode is very severe.  He has been trying Tylenol without relief.  He reports he has also developed sensitivity to light with his eyes.  He denies he had any fever.  He denies his neck is stiff but he reports he does feel he has a pain that radiates down left side of his neck as well.  He denies he had any fevers.  He has had nausea but no active vomiting.  He has not been experiencing any myalgias or generalized body aches.  No sore throat, no nasal congestion or drainage.  No focal weakness numbness or tingling of extremities.  Reports he does feel dizzy.  No falls or injuries.  Patient ports he did not take any of his blood pressure medications this morning.  He does report he typically takes them as instructed.     Past Medical History:  Diagnosis Date   Anxiety    Depression    Questionable per records. Not on any medication.    History of kidney stones    Hx of echocardiogram    a. Echo 12/13/12: Mild LVH, EF 78-29%, grade 1 diastolic dysfunction, mild LAE, mild RVE, mild RAE   Hypertension    Insomnia    Migraine    Noncompliance    OSA (obstructive sleep apnea)    a. Sleep Study 02/2013:  mod OSA, AHI 33 per hour, O2 sat nadir 85%    Patient Active Problem List   Diagnosis Date Noted   Morbid obesity with body mass index (BMI) of 40.0 to 44.9 in adult [facility]) 09/21/2019   CHF (congestive  heart failure), NYHA class I, acute, diastolic ([place]) 56/21/3086   Central sleep apnea comorbid with prescribed opioid use 09/21/2019   History of uncontrolled hypertension 09/21/2019   Complex sleep apnea syndrome 08/18/2019   Claustrophobia 08/18/2019   Anxiety associated with depression 08/18/2019   Hypokalemia 04/04/2017   Morbid obesity ([place]) 04/04/2017   Chest wall pain 04/04/2017   Asthma 02/13/2016   Non compliance with medical treatment 06/13/2015   OSA (obstructive sleep apnea) 04/06/2013   Abnormal EKG 11/08/2012   Essential hypertension    HTN (hypertension), malignant 04/29/2012   Migraine headache with aura 04/29/2012    Past Surgical History:  Procedure Laterality Date   Admission  04/2012   Malignant HTN, HA.  CT head negative, cardiology consult.     CYSTOSCOPY WITH RETROGRADE PYELOGRAM, URETEROSCOPY AND STENT PLACEMENT Left 01/24/2019   Procedure: CYSTOSCOPY WITH RETROGRADE PYELOGRAM, URETEROSCOPY AND STENT PLACEMENT;  Surgeon: Cleon Gustin, MD;  Location: [facility];  Service: Urology;  Laterality: Left;   HOLMIUM LASER APPLICATION Left 5/78/4696   Procedure: HOLMIUM LASER APPLICATION;  Surgeon: Cleon Gustin, MD;  Location: [facility];  Service: Urology;  Laterality: Left;   MANDIBLE SURGERY  1998   metal plate       Family History  Problem Relation Age of Onset   Cancer Father        prostate, colon- age was in his 10's   Heart disease Father 12       MI    Hypertension Father    Colon cancer Father    Diabetes Mother    Hypertension Mother    Hypertension Brother    Cancer Maternal Grandfather    Cancer Paternal Grandfather    Esophageal cancer Neg Hx    Stomach cancer Neg Hx    Rectal cancer Neg Hx     Social History   Tobacco Use   Smoking status: Never Smoker   Smokeless tobacco: Never Used  Substance Use Topics   Alcohol use: No    Alcohol/week: 0.0 standard  drinks   Drug use: No    Home Medications Prior to Admission medications   Medication Sig Start Date End Date Taking? Authorizing Provider  acetaminophen (TYLENOL) 325 MG tablet Take 650 mg by mouth every 6 (six) hours as needed for mild pain or headache.   Yes [provider]  amLODipine (NORVASC) 10 MG tablet Take 1 tablet by mouth every day 06/21/20  Yes [name], MD  buPROPion (WELLBUTRIN XL) 150 MG 24 hr tablet Take 1 tablet by mouth every day 10/16/20  Yes [name], MD  chlorthalidone (HYGROTON) 50 MG tablet Take 1 tablet (50 mg total) by mouth daily. 05/18/20  Yes [name], MD  lisinopril (ZESTRIL) 40 MG tablet Take 1 tablet by mouth every day 11/15/20  Yes [name], MD  meloxicam (MOBIC) 7.5 MG tablet Take 1 tablet (7.5 mg total) by mouth daily. 10/24/20  Yes Maximiano Coss, NP  potassium chloride (KLOR-CON) 10 MEQ tablet Take 1 tablet by mouth every day 09/16/20  Yes [name], MD    Allergies    Statins  Review of Systems   Review of Systems 10 systems reviewed and negative except as per HPI Physical Exam Updated Vital Signs BP 131/79    Pulse 98    Resp (!) 21    Ht 5\' 10"  (1.778 m)    Wt 135.2 kg    SpO2 97%    BMI 42.76 kg/m   Physical Exam Constitutional:      Comments: Patient is alert but appears very uncomfortable.  He is sitting up with eyes closed.  No respiratory distress.  HENT:     Head: Normocephalic and atraumatic.     Comments: No facial swelling.    Right Ear: Tympanic membrane, ear canal and external ear normal.     Left Ear: Tympanic membrane, ear canal and external ear normal.     Nose: Nose normal.     Mouth/Throat:     Mouth: Mucous membranes are moist.     Pharynx: Oropharynx is clear.  Eyes:     Extraocular Movements: Extraocular movements intact.     Conjunctiva/sclera: Conjunctivae normal.     Pupils: Pupils are equal, round, and reactive to light.  Cardiovascular:     Rate and Rhythm:  Normal rate and regular rhythm.     Pulses: Normal pulses.     Heart sounds: Normal heart sounds.  Pulmonary:     Effort: Pulmonary effort is normal.     Breath sounds: Normal breath sounds.  Abdominal:     General: There is no distension.     Palpations: Abdomen  is soft.     Tenderness: There is no abdominal tenderness. There is no guarding.  Musculoskeletal:        General: No swelling or tenderness. Normal range of motion.     Cervical back: Neck supple.     Right lower leg: No edema.     Left lower leg: No edema.  Skin:    General: Skin is warm and dry.  Neurological:     Comments: Patient is cognitively intact.  He is answering questions appropriately.  He appears uncomfortable.  He is keeping his eyes have closed.  Renal nerves II through XII intact.  Grip strength intact 5\5 bilaterally.  Patient can elevate and hold each extremity off the bed against resistance.     ED Results / Procedures / Treatments   Labs (all labs ordered are listed, but only abnormal results are displayed) Labs Reviewed  COMPREHENSIVE METABOLIC PANEL - Abnormal; Notable for the following components:      Result Value   Glucose, Bld 116 (*)    Calcium 8.8 (*)    All other components within normal limits  CBC WITH DIFFERENTIAL/PLATELET - Abnormal; Notable for the following components:   RDW 15.6 (*)    All other components within normal limits  C-REACTIVE PROTEIN - Abnormal; Notable for the following components:   CRP 2.2 (*)    All other components within normal limits  PROTIME-INR  SEDIMENTATION RATE  MAGNESIUM  RAPID URINE DRUG SCREEN, HOSP PERFORMED  URINALYSIS, ROUTINE W REFLEX MICROSCOPIC    EKG None  Radiology CT Angio Head W or Wo Contrast  Result Date: 11/16/2020 CLINICAL DATA:  Acute onset of headache and vertigo. EXAM: CT ANGIOGRAPHY HEAD AND NECK TECHNIQUE: Multidetector CT imaging of the head and neck was performed using the standard protocol during bolus administration of  intravenous contrast. Multiplanar CT image reconstructions and MIPs were obtained to evaluate the vascular anatomy. Carotid stenosis measurements (when applicable) are obtained utilizing NASCET criteria, using the distal internal carotid diameter as the denominator. CONTRAST:  26mL OMNIPAQUE IOHEXOL 350 MG/ML SOLN COMPARISON:  Head CT earlier same day. FINDINGS: CTA NECK FINDINGS Aortic arch: Mild ectasia of the aorta but no atherosclerotic calcification, aneurysm or dissection. Right carotid system: Common carotid artery widely patent to the bifurcation. Carotid bifurcation is normal without soft or calcified plaque. Cervical ICA widely patent. Left carotid system: Common carotid artery widely patent to the bifurcation. Carotid bifurcation is normal without soft or calcified plaque. Cervical ICA is normal. Vertebral arteries: Both vertebral artery origins are patent. No origin plaque or stenosis. Left vertebral artery is dominant. Both vertebral arteries appear normal through the cervical region to the foramen magnum. Skeleton: Minimal cervical spondylosis. Other neck: No soft tissue mass or lymphadenopathy.  Normal Upper chest: Normal Review of the MIP images confirms the above findings CTA HEAD FINDINGS Anterior circulation: Both internal carotid arteries are patent through the skull base and siphon regions. No siphon stenosis. The anterior and middle cerebral vessels are patent without proximal stenosis, aneurysm vascular malformation. No large or medium vessel occlusion. Posterior circulation: Vertebral arteries are patent at the foramen magnum. The right terminates in PICA. The left supplies the basilar. No basilar stenosis. Posterior circulation branch vessels are patent. Patent bilateral posterior communicating arteries. Venous sinuses: Patent and normal. Anatomic variants: None significant. Review of the MIP images confirms the above findings IMPRESSION: 1. Negative CT angiography of the neck and head. No  large or medium vessel occlusion or correctable proximal stenosis. No  intracranial aneurysm or vascular malformation. 2. Mild ectasia of the aortic arch but without atherosclerotic calcification, aneurysm or dissection. Electronically Signed   By: Nelson Chimes M.D.   On: 11/16/2020 14:56   CT Head Wo Contrast  Result Date: 11/16/2020 CLINICAL DATA:  Headache, hypertension EXAM: CT HEAD WITHOUT CONTRAST TECHNIQUE: Contiguous axial images were obtained from the base of the skull through the vertex without intravenous contrast. COMPARISON:  04/01/2020 FINDINGS: Brain: No evidence of acute infarction, hemorrhage, hydrocephalus, extra-axial collection or mass lesion/mass effect. Vascular: No hyperdense vessel or unexpected calcification. Skull: Normal. Negative for fracture or focal lesion. Sinuses/Orbits: Mucous retention cyst within the left maxillary sinus. Paranasal sinuses are otherwise clear. Mastoid air cells are clear. Orbital structures unremarkable. Other: None. IMPRESSION: No acute intracranial findings. Electronically Signed   By: Davina Poke D.O.   On: 11/16/2020 12:21   CT Angio Neck W and/or Wo Contrast  Result Date: 11/16/2020 CLINICAL DATA:  Acute onset of headache and vertigo. EXAM: CT ANGIOGRAPHY HEAD AND NECK TECHNIQUE: Multidetector CT imaging of the head and neck was performed using the standard protocol during bolus administration of intravenous contrast. Multiplanar CT image reconstructions and MIPs were obtained to evaluate the vascular anatomy. Carotid stenosis measurements (when applicable) are obtained utilizing NASCET criteria, using the distal internal carotid diameter as the denominator. CONTRAST:  57mL OMNIPAQUE IOHEXOL 350 MG/ML SOLN COMPARISON:  Head CT earlier same day. FINDINGS: CTA NECK FINDINGS Aortic arch: Mild ectasia of the aorta but no atherosclerotic calcification, aneurysm or dissection. Right carotid system: Common carotid artery widely patent to the  bifurcation. Carotid bifurcation is normal without soft or calcified plaque. Cervical ICA widely patent. Left carotid system: Common carotid artery widely patent to the bifurcation. Carotid bifurcation is normal without soft or calcified plaque. Cervical ICA is normal. Vertebral arteries: Both vertebral artery origins are patent. No origin plaque or stenosis. Left vertebral artery is dominant. Both vertebral arteries appear normal through the cervical region to the foramen magnum. Skeleton: Minimal cervical spondylosis. Other neck: No soft tissue mass or lymphadenopathy.  Normal Upper chest: Normal Review of the MIP images confirms the above findings CTA HEAD FINDINGS Anterior circulation: Both internal carotid arteries are patent through the skull base and siphon regions. No siphon stenosis. The anterior and middle cerebral vessels are patent without proximal stenosis, aneurysm vascular malformation. No large or medium vessel occlusion. Posterior circulation: Vertebral arteries are patent at the foramen magnum. The right terminates in PICA. The left supplies the basilar. No basilar stenosis. Posterior circulation branch vessels are patent. Patent bilateral posterior communicating arteries. Venous sinuses: Patent and normal. Anatomic variants: None significant. Review of the MIP images confirms the above findings IMPRESSION: 1. Negative CT angiography of the neck and head. No large or medium vessel occlusion or correctable proximal stenosis. No intracranial aneurysm or vascular malformation. 2. Mild ectasia of the aortic arch but without atherosclerotic calcification, aneurysm or dissection. Electronically Signed   By: Nelson Chimes M.D.   On: 11/16/2020 14:56    Procedures Procedures (including critical care time) CRITICAL CARE Performed by: Charlesetta Shanks   Total critical care time: 45 minutes  Critical care time was exclusive of separately billable procedures and treating other patients.  Critical  care was necessary to treat or prevent imminent or life-threatening deterioration.  Critical care was time spent personally by me on the following activities: development of treatment plan with patient and/or surrogate as well as nursing, discussions with consultants, evaluation of patient's response to treatment, examination  of patient, obtaining history from patient or surrogate, ordering and performing treatments and interventions, ordering and review of laboratory studies, ordering and review of radiographic studies, pulse oximetry and re-evaluation of patient's condition. Medications Ordered in ED Medications  lactated ringers infusion ( Intravenous New Bag/Given 11/16/20 1135)  sodium chloride (PF) 0.9 % injection (  Not Given 11/16/20 1433)  amLODipine (NORVASC) tablet 10 mg (has no administration in time range)  lisinopril (ZESTRIL) tablet 40 mg (has no administration in time range)  metoCLOPramide (REGLAN) injection 10 mg (10 mg Intravenous Given 11/16/20 1137)  diphenhydrAMINE (BENADRYL) injection 25 mg (25 mg Intravenous Given 11/16/20 1136)  hydrALAZINE (APRESOLINE) injection 10 mg (10 mg Intravenous Given 11/16/20 1137)  hydrALAZINE (APRESOLINE) injection 10 mg (10 mg Intravenous Given 11/16/20 1259)  hydrALAZINE (APRESOLINE) injection 20 mg (20 mg Intravenous Given 11/16/20 1344)  iohexol (OMNIPAQUE) 350 MG/ML injection 75 mL (75 mLs Intravenous Contrast Given 11/16/20 1436)    ED Course  I have reviewed the triage vital signs and the nursing notes.  Pertinent labs & imaging results that were available during my care of the patient were reviewed by me and considered in my medical decision making (see chart for details).    MDM Rules/Calculators/A&P                          Patient has history of left-sided headaches with photophobia.  Today's headache was severe.  Patient also had significantly elevated blood pressure.  He had not taken his morning medications.  Patient  reports he is generally compliant with medications however EMR indicates there may be problems with compliance.  Due to significantly elevated blood pressure and severity of headache, I was concerned for possible aneurysm\vertebrobasilar dissection or subarachnoid hemorrhage.  CT scan and CT angio obtained and normal in appearance.  Patient's blood pressure was treated IV with hydralazine.  It continued to decrease over the course of his stay.  Patient has become normotensive with hydralazine a total of 40 mg given incrementally, two 10 mg doses and one 20 mg dose.  Patient's headache has improved significantly.  He is alert and appropriate.  At this time, his routine home medications will be given as well.  Patient is counseled on necessity of compliance with his medications and documenting his blood pressures.  He is counseled on keeping a log of blood pressures so that they can be trended.  If they are elevating despite compliance with medications, he will need adjustments.  Return precautions reviewed.  Final Clinical Impression(s) / ED Diagnoses Final diagnoses:  Migraine without status migrainosus, not intractable, unspecified migraine type  Hypertensive urgency    Rx / DC Orders ED Discharge Orders    None       Charlesetta Shanks, MD 11/16/20 1516

## 2020-11-19 ENCOUNTER — Telehealth: Payer: Self-pay | Admitting: *Deleted

## 2020-11-19 NOTE — Telephone Encounter (Signed)
    TRANSITIONAL CARE MANAGEMENT TELEPHONE NOTE   Contact Date: 11/19/2020 Contacted By: [facility] Date of Discharge:11/16/2020 Discharge Facility: [facility] Principal Discharge Diagnosis :headache  Outpatient Follow Up Recommendations (from discharge summary)   Michael [name] is a male primary care patient of [name], MD. An outgoing telephone call was made today and I spoke with .  Michael [name] condition(s) and treatment(s) were discussed. An opportunity to ask questions was provided and all were answered or forward as appropriate.    ACTIVITIES OF DAILY LIVING  Michael [name] family and he can perform ADLs independently. his primary caregiver is himslef. he is able to depend on the them for consistent help. Transportation to appointments, to pick up medications, and to run errands is not a problem.  (Consider referral to [facility] if transportation or a consistent caregiver is a problem)   Fall Risk Fall Risk  10/24/2020 10/02/2020  Falls in the past year? 0 0  Number falls in past yr: 0 -  Injury with Fall? 0 -  Follow up Falls evaluation completed Falls evaluation completed    [place] Modifications/Assistive Devices Wheelchair: no Cane: no Ramp: no Bedside Toilet: no Hospital Bed: no Other:    [place] he is not receiving home health    MEDICATION RECONCILIATION (fulfills Medicare 30 day post-discharge medication reconciliation requirement) Michael [name] has been able to pick-up all prescribed discharge medications from the pharmacy.   A post discharge medication reconciliation was performed and the complete medication list was reviewed with the patient/caregiver and is current as of 11/19/2020. Changes highlighted below.  Discontinued Medications   Current Medication List Allergies as of 11/19/2020      Reactions   Statins Other (See Comments)   unknown      Medication List       Accurate  as of November 19, 2020 11:27 AM. If you have any questions, ask your nurse or doctor.        amLODipine 10 MG tablet Commonly known as: NORVASC Take 1 tablet by mouth every day   buPROPion 150 MG 24 hr tablet Commonly known as: WELLBUTRIN XL Take 1 tablet by mouth every day   chlorthalidone 50 MG tablet Commonly known as: HYGROTON Take 1 tablet (50 mg total) by mouth daily.   lisinopril 40 MG tablet Commonly known as: ZESTRIL Take 1 tablet by mouth every day   meloxicam 7.5 MG tablet Commonly known as: MOBIC Take 1 tablet (7.5 mg total) by mouth daily.   potassium chloride 10 MEQ tablet Commonly known as: KLOR-CON Take 1 tablet by mouth every day   Tylenol 325 MG tablet Generic drug: acetaminophen Take 650 mg by mouth every 6 (six) hours as needed for mild pain or headache.        PATIENT EDUCATION & FOLLOW-UP PLAN  An appointment for Transitional Care Management is scheduled with Maximiano Coss NP on [phone] at 3:30.  Take all medications as prescribed  Contact our office by calling [phone] if you have any questions or concerns

## 2020-11-26 ENCOUNTER — Encounter: Payer: Self-pay | Admitting: Registered Nurse

## 2020-11-26 ENCOUNTER — Other Ambulatory Visit: Payer: Self-pay

## 2020-11-26 ENCOUNTER — Ambulatory Visit (INDEPENDENT_AMBULATORY_CARE_PROVIDER_SITE_OTHER): Payer: Medicare Other | Admitting: Registered Nurse

## 2020-11-26 VITALS — BP 157/106 | HR 84 | Temp 98.0°F | Resp 18 | Ht 70.0 in | Wt 299.4 lb

## 2020-11-26 DIAGNOSIS — G479 Sleep disorder, unspecified: Secondary | ICD-10-CM

## 2020-11-26 DIAGNOSIS — G43709 Chronic migraine without aura, not intractable, without status migrainosus: Secondary | ICD-10-CM

## 2020-11-26 DIAGNOSIS — Z09 Encounter for follow-up examination after completed treatment for conditions other than malignant neoplasm: Secondary | ICD-10-CM

## 2020-11-26 DIAGNOSIS — I10 Essential (primary) hypertension: Secondary | ICD-10-CM

## 2020-11-26 MED ORDER — QUETIAPINE FUMARATE 50 MG PO TABS: 50.0000 mg | ORAL_TABLET | Freq: Every day | ORAL | 0 refills | Status: DC

## 2020-11-26 MED ORDER — PROPRANOLOL HCL ER 80 MG PO CP24: 80.0000 mg | ORAL_CAPSULE | Freq: Every day | ORAL | 0 refills | Status: DC

## 2020-11-26 NOTE — Patient Instructions (Signed)
° ° ° °  If you have lab work done today you will be contacted with your lab results within the next 2 weeks.  If you have not heard from us then please contact us. The fastest way to get your results is to register for My Chart. ° ° °IF you received an x-ray today, you will receive an invoice from [facility]. Please contact [facility] at [phone] with questions or concerns regarding your invoice.  ° °IF you received labwork today, you will receive an invoice from [facility]. Please contact [facility] at [phone] with questions or concerns regarding your invoice.  ° °Our billing staff will not be able to assist you with questions regarding bills from these companies. ° °You will be contacted with the lab results as soon as they are available. The fastest way to get your results is to activate your My Chart account. Instructions are located on the last page of this paperwork. If you have not heard from us regarding the results in 2 weeks, please contact this office. °  ° ° ° °

## 2020-12-15 ENCOUNTER — Other Ambulatory Visit: Payer: Self-pay | Admitting: Family Medicine

## 2020-12-15 DIAGNOSIS — I10 Essential (primary) hypertension: Secondary | ICD-10-CM

## 2020-12-27 ENCOUNTER — Ambulatory Visit (INDEPENDENT_AMBULATORY_CARE_PROVIDER_SITE_OTHER): Payer: Medicare Other | Admitting: Family Medicine

## 2020-12-27 ENCOUNTER — Other Ambulatory Visit: Payer: Self-pay

## 2020-12-27 ENCOUNTER — Encounter: Payer: Self-pay | Admitting: Family Medicine

## 2020-12-27 DIAGNOSIS — G8929 Other chronic pain: Secondary | ICD-10-CM

## 2020-12-27 DIAGNOSIS — M545 Low back pain, unspecified: Secondary | ICD-10-CM

## 2020-12-27 DIAGNOSIS — M79672 Pain in left foot: Secondary | ICD-10-CM | POA: Diagnosis not present

## 2020-12-27 MED ORDER — BACLOFEN 10 MG PO TABS
5.0000 mg | ORAL_TABLET | Freq: Three times a day (TID) | ORAL | 3 refills | Status: DC | PRN
Start: 2020-12-27 — End: 2021-03-11

## 2020-12-27 MED ORDER — CELECOXIB 200 MG PO CAPS: 200.0000 mg | ORAL_CAPSULE | Freq: Two times a day (BID) | ORAL | 6 refills | Status: DC | PRN

## 2020-12-27 NOTE — Progress Notes (Signed)
Office Visit Note   Patient: Michael [name]           Date of Birth: Sep 20, 1971           MRN: [id number] Visit Date: 12/27/2020 Requested by: Shade Flood, MD [address] [place],  [place] [address] PCP: Shade Flood, MD  Subjective: Chief Complaint  Patient presents with  . Lower Back - Pain    Pain in lower back x over 1 year. Stands all day at work (8 hours). Went to chiropractor once. The treatment was painful for him. Wears back brace at work - doesn't help.   . Left Heel - Pain    Pain in plantar aspect of heel. Has tried different shoes - not helping. Pain is sharp.    HPI: He is here with low back and left heel pain.  Low back has bothered him for about a year, seems to be getting worse. He was working at [employer] all day, and that seemed to make his pain significantly worse. He no longer has that job but is still having pain. He went to a chiropractor 1 time but the treatments made his pain worse. No radicular symptoms. It hurts worse when transitioning from sitting to standing, or when rolling over in bed.  His left heel has bothered him in the past month or 2, he thinks from standing on hard floors at work. Pain on the plantar aspect.                ROS:   All other systems were reviewed and are negative.  Objective: Vital Signs: There were no vitals taken for this visit.  Physical Exam:  General:  Alert and oriented, in no acute distress. Pulm:  Breathing unlabored. Psy:  Normal mood, congruent affect  Low back: He has tenderness primarily in the gluteus medius region of the right posterior hip. There is a little bit of tenderness in the midline over the L3-4 and L4-5 levels. Straight leg raise is negative, no pain with internal hip rotation. Lower extremity strength and reflexes are normal. Left heel: He has very tight hamstrings and heel cords. He is very tender at the medial origin of the plantar fascia. No pain with medial/lateral calcaneal  squeeze.    Imaging: No results found.  Assessment & Plan: 1. Chronic low back pain, probably myofascial. He does have a history of mild facet joint arthropathy on 2017 x-rays. -We will try home stretches, Celebrex and baclofen as needed. Physical therapy if symptoms persist. X-rays and MRI scan if fails conservative management.  2. Left heel plantar fasciitis -Home exercises given. Physical therapy if pain persist.     Procedures: No procedures performed        PMFS History: Patient Active Problem List   Diagnosis Date Noted  . Morbid obesity with body mass index (BMI) of 40.0 to 44.9 in adult [facility]) 09/21/2019  . CHF (congestive heart failure), NYHA class I, acute, diastolic (HCC) 09/21/2019  . Central sleep apnea comorbid with prescribed opioid use 09/21/2019  . History of uncontrolled hypertension 09/21/2019  . Complex sleep apnea syndrome 08/18/2019  . Claustrophobia 08/18/2019  . Anxiety associated with depression 08/18/2019  . Hypokalemia 04/04/2017  . Morbid obesity (HCC) 04/04/2017  . Chest wall pain 04/04/2017  . Asthma 02/13/2016  . Non compliance with medical treatment 06/13/2015  . OSA (obstructive sleep apnea) 04/06/2013  . Abnormal EKG 11/08/2012  . Essential hypertension   . HTN (hypertension), malignant  04/29/2012  . Migraine headache with aura 04/29/2012   Past Medical History:  Diagnosis Date  . Anxiety   . Depression    Questionable per records. Not on any medication.   Marland Kitchen History of kidney stones   . Hx of echocardiogram    a. Echo 12/13/12: Mild LVH, EF 99991111, grade 1 diastolic dysfunction, mild LAE, mild RVE, mild RAE  . Hypertension   . Insomnia   . Migraine   . Noncompliance   . OSA (obstructive sleep apnea)    a. Sleep Study 02/2013:  mod OSA, AHI 33 per hour, O2 sat nadir 85%    Family History  Problem Relation Age of Onset  . Cancer Father        prostate, colon- age was in his 60's  . Heart disease Father 77       MI   .  Hypertension Father   . Colon cancer Father   . Diabetes Mother   . Hypertension Mother   . Hypertension Brother   . Cancer Maternal Grandfather   . Cancer Paternal Grandfather   . Esophageal cancer Neg Hx   . Stomach cancer Neg Hx   . Rectal cancer Neg Hx     Past Surgical History:  Procedure Laterality Date  . Admission  04/2012   Malignant HTN, HA.  CT head negative, cardiology consult.    . CYSTOSCOPY WITH RETROGRADE PYELOGRAM, URETEROSCOPY AND STENT PLACEMENT Left 01/24/2019   Procedure: CYSTOSCOPY WITH RETROGRADE PYELOGRAM, URETEROSCOPY AND STENT PLACEMENT;  Surgeon: Cleon Gustin, MD;  Location: [facility];  Service: Urology;  Laterality: Left;  . HOLMIUM LASER APPLICATION Left [id number]   Procedure: HOLMIUM LASER APPLICATION;  Surgeon: Cleon Gustin, MD;  Location: [facility];  Service: Urology;  Laterality: Left;  Marland Kitchen MANDIBLE SURGERY  1998   metal plate   Social History   Occupational History  . Occupation: Unemployed//disabled  Tobacco Use  . Smoking status: Never Smoker  . Smokeless tobacco: Never Used  Substance and Sexual Activity  . Alcohol use: No    Alcohol/week: 0.0 standard drinks  . Drug use: No  . Sexual activity: Yes    Comment: per pt's blue health form - 1 sex partner in last 12 months

## 2021-01-03 ENCOUNTER — Telehealth (INDEPENDENT_AMBULATORY_CARE_PROVIDER_SITE_OTHER): Payer: Medicare Other | Admitting: Family Medicine

## 2021-01-03 ENCOUNTER — Other Ambulatory Visit: Payer: Self-pay

## 2021-01-03 ENCOUNTER — Encounter: Payer: Self-pay | Admitting: Family Medicine

## 2021-01-03 VITALS — Ht 70.0 in | Wt 290.0 lb

## 2021-01-03 DIAGNOSIS — R059 Cough, unspecified: Secondary | ICD-10-CM | POA: Diagnosis not present

## 2021-01-03 DIAGNOSIS — J069 Acute upper respiratory infection, unspecified: Secondary | ICD-10-CM

## 2021-01-03 MED ORDER — BENZONATATE 100 MG PO CAPS: 100.0000 mg | ORAL_CAPSULE | Freq: Three times a day (TID) | ORAL | 0 refills | Status: DC | PRN

## 2021-01-03 NOTE — Patient Instructions (Addendum)
I do recommend COVID testing as your symptoms could potentially be from COVID-19 infection.  Drive up testing can be performed here tomorrow.   Try mucinex or mucinex DM for cough (over the counter).  Tessalon perles prescribed as well.  Continue fluids, rest. Isolate for next 1-2 days, then can come out of isolation if negative testing, or if symptoms are improving and no fever within 24 hours of fever reducing medications.  Strict mask use around others for additional 5 days For total of 10 days from onset of your symptoms.  If any worsening symptoms including but not limited to shortness of breath at rest or with minimal activity, fevers, confusion, difficulty with drinking sufficient fluids, or other acute worsening symptoms be seen in the urgent care or ER.    If you have lab work done today you will be contacted with your lab results within the next 2 weeks.  If you have not heard from [place] then please contact us. The fastest way to get your results is to register for My Chart.   IF you received an x-ray today, you will receive an invoice from [facility]. Please contact [facility] at [phone] with questions or concerns regarding your invoice.   IF you received labwork today, you will receive an invoice from [facility]. Please contact [facility] at [phone] with questions or concerns regarding your invoice.   Our billing staff will not be able to assist you with questions regarding bills from these companies.  You will be contacted with the lab results as soon as they are available. The fastest way to get your results is to activate your My Chart account. Instructions are located on the last page of this paperwork. If you have not heard from [place] regarding the results in 2 weeks, please contact this office.

## 2021-01-03 NOTE — Progress Notes (Signed)
Virtual Visit via Telephone Note  I connected with Michael [name] on 01/03/21 at 5:20 PM by telephone and verified that I am speaking with the correct person using two identifiers. Patient location:home  My location: office   I discussed the limitations, risks, security and privacy concerns of performing an evaluation and management service by telephone and the availability of in person appointments. I also discussed with the patient that there may be a patient responsible charge related to this service. The patient expressed understanding and agreed to proceed, consent obtained  Chief complaint:  Chief Complaint  Patient presents with  . Cough    Pt reports a cough and sore throat for the past week. Pt reports no covid-19 test.      History of Present Illness:  Cough: Started 12/29/20, slight cough at night, worse cough that night, sore throat. Dry cough, but deep cough. Congestion, and runny nose. Continued sore throat, congestion,cough - about the same. Better during day Lightheaded with coughing fit only. No cough syncope.  Dyspnea with coughing fit, or 3 flights of stairs. Not at rest or usual activity.  No fevers.  Drinking fluids ok.  No n/v/d.  No sick contacts.  No otc meds.  No chest pains - only sore with cough.  Has not had covid test. He would possibly be interested in antiviral or monoclonal AB if covid positive.    Has received COVID-vaccine March 18, April 09, 2020. Has not had booster. History of obstructive sleep apnea, CHF, NYHA class I.  EF 60 to 65% on echo in May 2018.  Normal stress testing at that time. No new leg swelling.    Patient Active Problem List   Diagnosis Date Noted  . Morbid obesity with body mass index (BMI) of 40.0 to 44.9 in adult [facility]) 09/21/2019  . CHF (congestive heart failure), NYHA class I, acute, diastolic (HCC) 09/21/2019  . Central sleep apnea comorbid with prescribed opioid use 09/21/2019  . History of uncontrolled hypertension  09/21/2019  . Complex sleep apnea syndrome 08/18/2019  . Claustrophobia 08/18/2019  . Anxiety associated with depression 08/18/2019  . Hypokalemia 04/04/2017  . Morbid obesity (HCC) 04/04/2017  . Chest wall pain 04/04/2017  . Asthma 02/13/2016  . Non compliance with medical treatment 06/13/2015  . OSA (obstructive sleep apnea) 04/06/2013  . Abnormal EKG 11/08/2012  . Essential hypertension   . HTN (hypertension), malignant 04/29/2012  . Migraine headache with aura 04/29/2012   Past Medical History:  Diagnosis Date  . Anxiety   . Depression    Questionable per records. Not on any medication.   Marland Kitchen History of kidney stones   . Hx of echocardiogram    a. Echo 12/13/12: Mild LVH, EF 50-55%, grade 1 diastolic dysfunction, mild LAE, mild RVE, mild RAE  . Hypertension   . Insomnia   . Migraine   . Noncompliance   . OSA (obstructive sleep apnea)    a. Sleep Study 02/2013:  mod OSA, AHI 33 per hour, O2 sat nadir 85%   Past Surgical History:  Procedure Laterality Date  . Admission  04/2012   Malignant HTN, HA.  CT head negative, cardiology consult.    . CYSTOSCOPY WITH RETROGRADE PYELOGRAM, URETEROSCOPY AND STENT PLACEMENT Left 01/24/2019   Procedure: CYSTOSCOPY WITH RETROGRADE PYELOGRAM, URETEROSCOPY AND STENT PLACEMENT;  Surgeon: Malen Gauze, MD;  Location: [facility];  Service: Urology;  Laterality: Left;  . HOLMIUM LASER APPLICATION Left 01/24/2019   Procedure: HOLMIUM LASER APPLICATION;  Surgeon: Cleon Gustin, MD;  Location: [facility];  Service: Urology;  Laterality: Left;  Marland Kitchen MANDIBLE SURGERY  1998   metal plate   Allergies  Allergen Reactions  . Statins Other (See Comments)    unknown   Prior to Admission medications   Medication Sig Start Date End Date Taking? Authorizing Provider  acetaminophen (TYLENOL) 325 MG tablet Take 650 mg by mouth every 6 (six) hours as needed for mild pain or headache.   Yes [provider]   amLODipine (NORVASC) 10 MG tablet Take 1 tablet by mouth every day 12/15/20  Yes [name], MD  baclofen (LIORESAL) 10 MG tablet Take 0.5-1 tablets (5-10 mg total) by mouth 3 (three) times daily as needed for muscle spasms. 12/27/20  Yes Hilts, Legrand Como, MD  buPROPion (WELLBUTRIN XL) 150 MG 24 hr tablet Take 1 tablet by mouth every day 10/16/20  Yes [name], MD  celecoxib (CELEBREX) 200 MG capsule Take 1 capsule (200 mg total) by mouth 2 (two) times daily as needed. 12/27/20  Yes Hilts, Legrand Como, MD  chlorthalidone (HYGROTON) 50 MG tablet Take 1 tablet (50 mg total) by mouth daily. 05/18/20  Yes [name], MD  lisinopril (ZESTRIL) 40 MG tablet Take 1 tablet by mouth every day 11/15/20  Yes [name], MD  meloxicam (MOBIC) 7.5 MG tablet Take 1 tablet (7.5 mg total) by mouth daily. 10/24/20  Yes Maximiano Coss, NP  potassium chloride (KLOR-CON) 10 MEQ tablet Take 1 tablet by mouth every day 09/16/20  Yes [name], MD  propranolol ER (INDERAL LA) 80 MG 24 hr capsule Take 1 capsule (80 mg total) by mouth daily. 11/26/20  Yes Maximiano Coss, NP  QUEtiapine (SEROQUEL) 50 MG tablet Take 1 tablet (50 mg total) by mouth at bedtime. 11/26/20  Yes Maximiano Coss, NP   Social History   Socioeconomic History  . Marital status: Divorced    Spouse name: Not on file  . Number of children: 4  . Years of education: Not on file  . Highest education level: Not on file  Occupational History  . Occupation: Unemployed//disabled  Tobacco Use  . Smoking status: Never Smoker  . Smokeless tobacco: Never Used  Substance and Sexual Activity  . Alcohol use: No    Alcohol/week: 0.0 standard drinks  . Drug use: No  . Sexual activity: Yes    Comment: per pt's blue health form - 1 sex partner in last 12 months  Other Topics Concern  . Not on file  Social History Narrative   Marital status: single; living with fiance      Children: 4 children, no grandchildren.       Employment:  Disability for learning disabilities since 2012.   Right-handed   Caffeine: occasional         Social Determinants of Radio broadcast assistant Strain: Not on file  Food Insecurity: Not on file  Transportation Needs: Not on file  Physical Activity: Not on file  Stress: Not on file  Social Connections: Not on file  Intimate Partner Violence: Not on file     Observations/Objective: Vitals:   01/03/21 1140  Weight: 290 lb (131.5 kg)  Height: 5\' 10"  (1.778 m)   Cough a few times on call but was speaking in full sentences without apparent respiratory distress.  All questions were answered, coherent responses.  Understanding expressed of plan.  Assessment and Plan: Cough - Plan: benzonatate (TESSALON) 100 MG capsule, COVID-19,  Flu A+B and RSV  Acute upper respiratory infection - Plan: COVID-19, Flu A+B and RSV Cough with URI symptoms, differential includes COVID-19, versus other viral illness.  Overall stable symptoms past few days but not improving.  No red flags on initial history on phone but ER/urgent care precautions given. Symptomatic care discussed with Mucinex or Mucinex DM, Tessalon Perls prescribed if needed.  Fluids, rest, isolation and masking precautions were discussed.  Drive up COVID/flu/RSV testing tomorrow.  Follow Up Instructions:  ER/urgent care precautions given.  Drive up COVID/flu/RSV testing tomorrow.  Patient Instructions   Try mucinex or mucinex DM for cough (over the counter).  Tessalon perles prescribed as well.  Continue fluids, rest. Isolate for next few 2 days.      If you have lab work done today you will be contacted with your lab results within the next 2 weeks.  If you have not heard from [place] then please contact us. The fastest way to get your results is to register for My Chart.   IF you received an x-ray today, you will receive an invoice from [facility]. Please contact [facility] at [phone] with  questions or concerns regarding your invoice.   IF you received labwork today, you will receive an invoice from [facility]. Please contact [facility] at [phone] with questions or concerns regarding your invoice.   Our billing staff will not be able to assist you with questions regarding bills from these companies.  You will be contacted with the lab results as soon as they are available. The fastest way to get your results is to activate your My Chart account. Instructions are located on the last page of this paperwork. If you have not heard from [place] regarding the results in 2 weeks, please contact this office.        I discussed the assessment and treatment plan with the patient. The patient was provided an opportunity to ask questions and all were answered. The patient agreed with the plan and demonstrated an understanding of the instructions.   The patient was advised to call back or seek an in-person evaluation if the symptoms worsen or if the condition fails to improve as anticipated.  I provided 20 minutes of non-face-to-face time during this encounter.  Signed,   [name], MD [facility] [place].  01/03/21

## 2021-01-04 DIAGNOSIS — R059 Cough, unspecified: Secondary | ICD-10-CM | POA: Diagnosis not present

## 2021-01-04 DIAGNOSIS — J069 Acute upper respiratory infection, unspecified: Secondary | ICD-10-CM | POA: Diagnosis not present

## 2021-01-07 ENCOUNTER — Ambulatory Visit: Payer: Medicare Other | Admitting: Physical Therapy

## 2021-01-08 ENCOUNTER — Ambulatory Visit: Payer: Self-pay | Admitting: *Deleted

## 2021-01-08 LAB — COVID-19, FLU A+B AND RSV
Influenza A, NAA: NOT DETECTED
Influenza B, NAA: NOT DETECTED
RSV, NAA: NOT DETECTED
SARS-CoV-2, NAA: DETECTED — AB

## 2021-01-08 NOTE — Telephone Encounter (Signed)
Pt goes to [facility] on [place].  He was tested for covid there and it is positive.   I went over the care advice and quarantine protocol.   Tomorrow will be his last day of quarantine.  Only symptom he still has is no taste/smell.  I answered his questions.   I let him know the [facility] may contact him for follow up.  They will be notified. He verbalized understanding of my instructions and will contact 2 people he was in contact with.    Reason for Disposition . [1] [id number] diagnosed by positive lab test (e.g., PCR, rapid self-test kit) AND [2] mild symptoms (e.g., cough, fever, others) AND [2] no complications or SOB  Answer Assessment - Initial Assessment Questions 1. COVID-19 DIAGNOSIS: "Who made your COVID-19 diagnosis?" "Was it confirmed by a positive lab test?" If not diagnosed by a HCP, ask "Are there lots of cases (community spread) where you live?" Note: See public health department website, if unsure.     Tested at [facility] on [address] by Dr. Carlota Raspberry.  It's positive for covid. 2. COVID-19 EXPOSURE: "Was there any known exposure to COVID before the symptoms began?" CDC Definition of close contact: within 6 feet (2 meters) for a total of 15 minutes or more over a 24-hour period.      Not that I know of. 3. ONSET: "When did the COVID-19 symptoms start?"      I can't taste/smell since last week.   My throat was sore and I was coughing.  No body aches or chills or V/D 4. WORST SYMPTOM: "What is your worst symptom?" (e.g., cough, fever, shortness of breath, muscle aches)     Taste/smell 5. COUGH: "Do you have a cough?" If Yes, ask: "How bad is the cough?"       Not now 6. FEVER: "Do you have a fever?" If Yes, ask: "What is your temperature, how was it measured, and when did it start?"     No 7. RESPIRATORY STATUS: "Describe your breathing?" (e.g., shortness of breath, wheezing, unable to speak)      No   8. BETTER-SAME-WORSE: "Are you getting better, staying the  same or getting worse compared to yesterday?"  If getting worse, ask, "In what way?"     Better 9. HIGH RISK DISEASE: "Do you have any chronic medical problems?" (e.g., asthma, heart or lung disease, weak immune system, obesity, etc.)     Hypertension use CPAP 10. VACCINE: "Have you gotten the COVID-19 vaccine?" If Yes ask: "Which one, how many shots, when did you get it?"       Yes had first 2 shots in April last year.    11. PREGNANCY: "Is there any chance you are pregnant?" "When was your last menstrual period?"       N/A 12. OTHER SYMPTOMS: "Do you have any other symptoms?"  (e.g., chills, fatigue, headache, loss of smell or taste, muscle pain, sore throat; new loss of smell or taste especially support the diagnosis of COVID-19)       No  Protocols used: CORONAVIRUS (COVID-19) DIAGNOSED OR SUSPECTED-A-AH

## 2021-01-09 ENCOUNTER — Telehealth: Payer: Self-pay | Admitting: Family Medicine

## 2021-01-09 NOTE — Telephone Encounter (Signed)
Pt's sister dropped off paperwork for Dr.Greene to fill out-SCAT paperwork. I have placed in the nurse's station box. Please advise pt when complete at [phone] he will come in to pick up.

## 2021-01-10 NOTE — Telephone Encounter (Signed)
Dr Carlota Raspberry has this

## 2021-01-11 NOTE — Telephone Encounter (Signed)
Pt is calling seeing if paperwork is finished. Pt stated if so he can fax it off but he would like his sister to pick it up today before we close. Pt is aware that it might not be done today. sisters number is [phone]

## 2021-01-14 ENCOUNTER — Ambulatory Visit: Payer: Medicare Other

## 2021-01-14 NOTE — Telephone Encounter (Signed)
Pt will be contacted once paper work has been completed. 

## 2021-01-15 ENCOUNTER — Other Ambulatory Visit: Payer: Self-pay | Admitting: Family Medicine

## 2021-01-15 ENCOUNTER — Ambulatory Visit: Payer: Medicare Other | Attending: Family Medicine

## 2021-01-15 ENCOUNTER — Other Ambulatory Visit: Payer: Self-pay

## 2021-01-15 DIAGNOSIS — M545 Low back pain, unspecified: Secondary | ICD-10-CM | POA: Insufficient documentation

## 2021-01-15 DIAGNOSIS — M25562 Pain in left knee: Secondary | ICD-10-CM | POA: Insufficient documentation

## 2021-01-15 DIAGNOSIS — M25672 Stiffness of left ankle, not elsewhere classified: Secondary | ICD-10-CM | POA: Diagnosis not present

## 2021-01-15 DIAGNOSIS — R2689 Other abnormalities of gait and mobility: Secondary | ICD-10-CM | POA: Diagnosis not present

## 2021-01-15 DIAGNOSIS — M79672 Pain in left foot: Secondary | ICD-10-CM | POA: Diagnosis not present

## 2021-01-15 DIAGNOSIS — I10 Essential (primary) hypertension: Secondary | ICD-10-CM

## 2021-01-15 DIAGNOSIS — G8929 Other chronic pain: Secondary | ICD-10-CM | POA: Diagnosis not present

## 2021-01-15 DIAGNOSIS — M62830 Muscle spasm of back: Secondary | ICD-10-CM | POA: Insufficient documentation

## 2021-01-15 DIAGNOSIS — R2681 Unsteadiness on feet: Secondary | ICD-10-CM | POA: Diagnosis not present

## 2021-01-15 NOTE — Patient Instructions (Signed)
Access Code: [email or web address] URL: [email or web address] Date: 01/15/2021 Prepared by: Sherlynn Stalls  Exercises Gastroc Stretch on Wall - 1 x daily - 7 x weekly - 4 reps - 30 hold Seated Plantar Fascia Stretch - 1 x daily - 7 x weekly - 4 reps - 30 hold Seated Toe Towel Scrunches - 1 x daily - 7 x weekly - 3 sets - 10 reps Heel rises with counter support - 1 x daily - 7 x weekly - 3 sets - 10 reps Seated plantar fascia massage using frozen water bottle (sock on or bottle covered in a sheet) 5-10 minutes at a time.

## 2021-01-15 NOTE — Telephone Encounter (Signed)
SCAT paperwork completed by provider, copies made for chart, pt called and informed forms are ready for pick up. Copy placed in scan file

## 2021-01-15 NOTE — Therapy (Signed)
[facility] [address] [place], [place], [address] Phone: [phone]   Fax:  [phone]  Physical Therapy Evaluation  Patient Details  Name: Michael [name] MRN: [id number] Date of Birth: 07/02/1971 Referring Provider (PT): Hilts   Encounter Date: 01/15/2021   PT End of Session - 01/15/21 1610    Visit Number 1    Number of Visits 17    Date for PT Re-Evaluation 03/12/21    PT Start Time 1420    PT Stop Time 1515    PT Time Calculation (min) 55 min    Activity Tolerance Patient tolerated treatment well;Patient limited by pain;Patient limited by fatigue    Behavior During Therapy [facility] for tasks assessed/performed           Past Medical History:  Diagnosis Date  . Anxiety   . Depression    Questionable per records. Not on any medication.   Marland Kitchen History of kidney stones   . Hx of echocardiogram    a. Echo 12/13/12: Mild LVH, EF 50-55%, grade 1 diastolic dysfunction, mild LAE, mild RVE, mild RAE  . Hypertension   . Insomnia   . Migraine   . Noncompliance   . OSA (obstructive sleep apnea)    a. Sleep Study 02/2013:  mod OSA, AHI 33 per hour, O2 sat nadir 85%    Past Surgical History:  Procedure Laterality Date  . Admission  04/2012   Malignant HTN, HA.  CT head negative, cardiology consult.    . CYSTOSCOPY WITH RETROGRADE PYELOGRAM, URETEROSCOPY AND STENT PLACEMENT Left 01/24/2019   Procedure: CYSTOSCOPY WITH RETROGRADE PYELOGRAM, URETEROSCOPY AND STENT PLACEMENT;  Surgeon: Malen Gauze, MD;  Location: [facility];  Service: Urology;  Laterality: Left;  . HOLMIUM LASER APPLICATION Left 01/24/2019   Procedure: HOLMIUM LASER APPLICATION;  Surgeon: Malen Gauze, MD;  Location: [facility];  Service: Urology;  Laterality: Left;  Marland Kitchen MANDIBLE SURGERY  1998   metal plate    There were no vitals filed for this visit.    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   First began to experience some left knee pain at work around November 2021. Had an injection in the knee for knee pain. Mid November, he reports his low back began to hurt alot. Went to chiropractor without relief. Was also given ms relaxers which reports using primarily at night.  Into December 2021, Aikeem began to experience left heel pain. He was out of work for November and December 2021 and is currently not working/Unemployed due to low back and heel pain right now.    Pertinent History Sleep apnea, depresssion, HTN, headaches/migraines    Limitations Sitting;Lifting;Standing;House hold activities;Walking    How long can you sit comfortably? 5-10 minutes before back discomfort    How long can you stand comfortably? less than 5 minutes    How long can you walk comfortably? less than 5 minutes    Patient Stated Goals To be able to get in/out of bed easily/without pain, do housework without pain, go/up down stairs, stand and walk with little to no pain    Currently in Pain? Yes    Pain Score 7     Pain Location Heel    Pain Orientation Left;Medial;Anterior    Pain Descriptors / Indicators Aching;Burning;Sharp;Sore    Pain Type Chronic pain  Pain Onset More than a month ago    Pain Frequency Constant    Aggravating Factors  Putting weight on left heel    Pain Relieving Factors rest, ms    Multiple Pain Sites Yes    Pain Score 4    Pain Location Back    Pain Orientation Lower    Pain Descriptors / Indicators Aching;Sore;Sharp    Pain Type Chronic pain    Pain Onset More than a month ago    Pain Frequency Intermittent    Aggravating Factors  Bending, lifting, pushing    Pain Relieving Factors rest    Effect of Pain on Daily Activities Unable to do typical housework, unable to work              [facility] PT Assessment - 01/15/21 0001      Assessment   Medical Diagnosis Chronic LBP, Intractable Left  Heel Pain    Referring Provider (PT) Hilts    Onset Date/Surgical Date --   November 2021   Hand Dominance Right    Next MD Visit Not yet scheduled    Prior Therapy none      Balance Screen   Has the patient fallen in the past 6 months No    Has the patient had a decrease in activity level because of a fear of falling?  No    Is the patient reluctant to leave their home because of a fear of falling?  No      Home Environment   Living Environment Private residence    Living Arrangements Alone    Type of [place] to enter    Entrance Stairs-Number of Steps 3 flights of stairs    Entrance Stairs-Rails Left   Uses primarily when having to carry bags up stairs.     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 Could be standing for up to 8 hours shifts.    Leisure Dance      Cognition   Overall Cognitive Status Within Functional Limits for tasks assessed      Observation/Other Assessments   Observations diminished foot arch on the left, reproduced slightly with big toe extension.    Skin Integrity LEFT foot: dry skin, callouses beginning to form at lateral hallux, blister forming on lateral 5th toe.    Focus on Therapeutic Outcomes (FOTO)  22/100. Stage 2 means the patient exhibits extreme difficulty performing  usual work or household activities.      Posture/Postural Control   Posture/Postural Control Postural limitations    Postural Limitations Rounded Shoulders;Forward head;Decreased lumbar lordosis;Posterior pelvic tilt;Weight shift right      ROM / Strength   AROM / PROM / Strength PROM;Strength      PROM   PROM Assessment Site Ankle;Lumbar    Right/Left Ankle Left    Left Ankle Dorsiflexion --   3 deg with Knee extended, 8 deg with knee flexed.   Left Ankle Plantar Flexion  35    Lumbar Flexion 50%, + pain    Lumbar Extension <25%, + pain    Lumbar - Right Side Bend 75%, + pain    Lumbar - Left Side Bend 75%    Lumbar - Right Rotation 50% bilateral + pain      Strength   Overall Strength  Comments General muscle weakness, trunk weakness as observed with difficulty with bed mobility    Right/Left Hip Right;Left    Right Hip Flexion 4-/5    Right Hip Extension 2/5    Right Hip ABduction 3-/5    Left Hip Flexion 4-/5    Left Hip Extension 2/5    Left Hip ABduction 3-/5    Right/Left Ankle Right;Left    Right Ankle Dorsiflexion 3-/5    Right Ankle Plantar Flexion 3+/5    Left Ankle Dorsiflexion 4/5    Left Ankle Plantar Flexion 4/5      Flexibility   Soft Tissue Assessment /Muscle Length yes   Moderate tightness noted in LEFT gastroc   Hamstrings moderate tightness bilateral      Palpation   Palpation comment TTP lower lumbar spine      Special Tests    Special Tests Lumbar    Lumbar Tests Slump Test      Slump test   Findings Positive    Side Left      Bed Mobility   Bed Mobility Rolling Left;Left Sidelying to Sit;Sit to Supine    Rolling Left Other (comment)   LBP exacerbated with rolling, increased time required to complete.   Left Sidelying to Sit --   Difficult, increased time required and low back pain exacerbated.   Sit to Supine --   Difficult, increased time required and low back pain exacerbated.     Ambulation/Gait   Gait Pattern Decreased arm swing - right;Decreased arm swing - left;Decreased step length - left;Decreased stance time - left;Decreased stride length;Decreased weight shift to left;Lateral trunk lean to right    Stairs Yes    Stairs Assistance 6: Modified independent (Device/Increase time)    Stair Management Technique Step to pattern   Left leading, tip toe contact on the left to ascend. Able to slowl     Balance   Balance Assessed Yes    Balance comment SLS: LEFT= 1 second + heel and back pain. RIGHT=3 seconds.             Objective measurements completed on examination: See above findings.          PT Education - 01/15/21 1610    Education Details Initial HEp provided    Person(s) Educated Patient    Methods Explanation;Demonstration;Handout    Comprehension Verbalized understanding;Returned demonstration            PT Short Term Goals - 01/15/21 1624      PT SHORT TERM GOAL #1   Title independent with initial HEP    Time 8    Period Weeks    Status New    Target Date 03/12/21             PT Long Term Goals - 01/15/21 1625      PT LONG TERM GOAL #1   Title Pt will demonstrate left ankle DF ROM to at least 15 deg with knee extended    Baseline 3 deg    Time 8    Period Weeks    Status New    Target Date 03/12/21      PT LONG TERM GOAL #2   Title Pt will have increased lumbar extension to at least 50% with  </=1/10 pain    Time 8    Period Weeks    Status New    Target Date 03/12/21      PT LONG TERM GOAL #3   Title Pt will  be able to negotiate stair using a safe, alternating pattern without HR and </= 1/10 low back or heel pain    Time 8    Period Weeks    Status New    Target Date 03/12/21      PT LONG TERM GOAL #4   Title Pt will be able to lift and carry groceries without pain    Time 8    Period Weeks    Status New    Target Date 03/12/21      PT LONG TERM GOAL #5   Title Pt will demonstrate improved SLS to at least 15 seconds with eyes open on either leg.    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 First began to experience some left knee pain at work around November 2021. Had an injection in the knee for knee pain. Mid November, he reports his low back began to hurt alot.  Into December 2021, Gee began to experience left heel pain. He was out of work for  November and December 2021 and is currently not working/Unemployed due to low back and heel pain right now. Sabatino presents with significant low back pain and LEFT heel pain in the region of plantar fascia insertion. He presents with muscle weakness, decreased trunk and LE flexibility, impaired balance, and abnormal gait. Due to these impairments, He is unable to stand or walk for greater than 5 minutes without pain, has difficulty completing housework and is unable to participate in recreational or work duties at this time. Yvette would benefit from skilled physical therapy to improve ROM, strength, balance, gait, and general functional mobility and work towards getting back to PLOF.    Personal Factors and Comorbidities Comorbidity 3+    Comorbidities HTN, Sleep apnea, Headaches/migraines, depression    Examination-Activity Limitations Bed Mobility;Bend;Carry;Dressing;Hygiene/Grooming;Lift;Stand;Stairs;Squat;Locomotion Level;Transfers    Examination-Participation Restrictions Occupation;Community Activity    Stability/Clinical Decision Making Evolving/Moderate complexity    Clinical Decision Making Moderate    Rehab Potential Good    PT Frequency 2x / week    PT Duration 8 weeks    PT Treatment/Interventions ADLs/Self Care Home Management;Cryotherapy;Electrical Stimulation;Moist Heat;Neuromuscular re-education;Balance training;Therapeutic exercise;Therapeutic activities;Functional mobility training;Stair training;Gait training;Patient/family education;Manual techniques;Passive range of motion    PT Next Visit Plan Follow up on home program carryover next visit. Progress stretches/ TE as tolerated. Modalities and manual as needed    Consulted and Agree with Plan of Care Patient           Patient will benefit from skilled therapeutic intervention in order to improve the following deficits and impairments:  Abnormal gait,Decreased range of motion,Difficulty walking,Increased fascial  restricitons,Increased muscle spasms,Decreased endurance,Decreased activity tolerance,Hypomobility,Impaired flexibility,Improper body mechanics,Postural dysfunction,Decreased strength,Decreased mobility,Decreased balance  Visit Diagnosis: Chronic bilateral low back pain, unspecified whether sciatica present - Plan: PT plan of care cert/re-cert  Muscle spasm of back - Plan: PT plan of care cert/re-cert  Stiffness of left ankle, not elsewhere classified - Plan: PT plan of care cert/re-cert  Other abnormalities of gait and mobility - Plan: PT plan of care cert/re-cert  Unsteadiness on feet - Plan: PT plan of care cert/re-cert  Intractable left heel pain - Plan: PT plan of care cert/re-cert  Chronic pain of left knee - Plan: PT plan of care cert/re-cert     Problem List Patient Active Problem List   Diagnosis Date Noted  . Morbid obesity with body mass index (BMI) of 40.0 to 44.9 in adult [facility]) 09/21/2019  . CHF (congestive heart failure), NYHA class I, acute, diastolic ([place]) 27/61/4709  . Central sleep apnea comorbid with prescribed opioid use 09/21/2019  . History of uncontrolled hypertension 09/21/2019  . Complex sleep apnea syndrome 08/18/2019  . Claustrophobia 08/18/2019  . Anxiety associated with depression 08/18/2019  . Hypokalemia 04/04/2017  . Morbid obesity ([place]) 04/04/2017  . Chest wall pain 04/04/2017  . Asthma 02/13/2016  . Non compliance with medical treatment 06/13/2015  . OSA (obstructive sleep apnea) 04/06/2013  . Abnormal EKG 11/08/2012  . Essential hypertension   . HTN (hypertension), malignant 04/29/2012  . Migraine headache with aura 04/29/2012    Hall Busing, PT, DPT 01/15/2021, 4:37 PM  [place]. [place], [place], [address] Phone: [phone]   Fax:  [phone]  Name: Merl Nunnery MRN: [id number] Date of Birth: [birth year]

## 2021-01-15 NOTE — Telephone Encounter (Signed)
Requested medication (s) are due for refill today:   Not sure  Requested medication (s) are on the active medication list:   The Coreg is not  Future visit scheduled:   No   Seen by Dr. Carlota Raspberry a wk ago   Last ordered: Coreg is not on his medication list.  LR was 06/21/2020 #60.                        H[place]ro[place] 06/21/2020 #30.  Returned because it's not clear whether these need to be refilled or if Dr. Carlota Raspberry prescribes these especially the Coreg.   Thanks.   Requested Prescriptions  Pending Prescriptions Disp Refills   carvedilol (COREG) 6.25 MG tablet [Pharmacy Med Name: Carvedilol 6.25mg  Tablet] 60 tablet 11    Sig: Take 1 tablet by mouth twice daily      Cardiovascular:  Beta Blockers Failed - 01/15/2021  8:12 AM      Failed - Last BP in normal range    BP Readings from Last 1 Encounters:  11/26/20 (!) 157/106          Passed - Last Heart Rate in normal range    Pulse Readings from Last 1 Encounters:  11/26/20 84          Passed - Valid encounter within last 6 months    Recent Outpatient Visits           1 week ago Cough   [facility], Ranell Patrick, MD   1 month ago Chronic migraine without aura without status migrainosus, not intractable   [facility], Delfino Lovett, NP   2 months ago Acute pain of left knee   [facility], Delfino Lovett, NP   3 months ago Chronic migraine without aura without status migrainosus, not intractable   [facility], [place], MD   5 months ago Possible exposure to STD   [facility], Ranell Patrick, MD                  chlorthalidone (HYGROTON) 25 MG tablet [Pharmacy Med Name: Chlorthalidone 25mg  Tablet] 30 tablet 11    Sig: Take 1 tablet by mouth every day      Cardiovascular: Diuretics - Thiazide Failed - 01/15/2021  8:12 AM      Failed - Ca in normal range and within 360 days    Calcium  Date Value Ref Range Status  11/16/2020 8.8 (L) 8.9 - 10.3 mg/dL  Final   Calcium, Ion  Date Value Ref Range Status  04/01/2020 1.24 1.15 - 1.40 mmol/L Final          Failed - Last BP in normal range    BP Readings from Last 1 Encounters:  11/26/20 (!) 157/106          Passed - Cr in normal range and within 360 days    Creat  Date Value Ref Range Status  06/03/2016 1.07 0.60 - 1.35 mg/dL Final   Creatinine, Ser  Date Value Ref Range Status  11/16/2020 0.94 0.61 - 1.24 mg/dL Final          Passed - K in normal range and within 360 days    Potassium  Date Value Ref Range Status  11/16/2020 3.6 3.5 - 5.1 mmol/L Final          Passed - Na in normal range and within 360 days  Sodium  Date Value Ref Range Status  11/16/2020 140 135 - 145 mmol/L Final  07/20/2020 142 134 - 144 mmol/L Final          Passed - Valid encounter within last 6 months    Recent Outpatient Visits           1 week ago Cough   [facility], Ranell Patrick, MD   1 month ago Chronic migraine without aura without status migrainosus, not intractable   [facility], [place], NP   2 months ago Acute pain of left knee   [facility], [place], NP   3 months ago Chronic migraine without aura without status migrainosus, not intractable   [facility], Ines Bloomer, MD   5 months ago Possible exposure to STD   [facility], Ranell Patrick, MD

## 2021-01-16 MED ORDER — CHLORTHALIDONE 50 MG PO TABS: 50.0000 mg | ORAL_TABLET | Freq: Every day | ORAL | 1 refills | Status: DC

## 2021-01-21 ENCOUNTER — Ambulatory Visit: Payer: Medicare Other | Admitting: Physical Therapy

## 2021-01-22 ENCOUNTER — Ambulatory Visit: Payer: Medicare Other | Admitting: Physical Therapy

## 2021-01-23 ENCOUNTER — Ambulatory Visit: Payer: Medicare Other

## 2021-01-24 ENCOUNTER — Ambulatory Visit: Payer: Medicare Other | Admitting: Physical Therapy

## 2021-02-12 ENCOUNTER — Other Ambulatory Visit: Payer: Self-pay

## 2021-02-12 ENCOUNTER — Ambulatory Visit: Payer: Medicare Other | Attending: Family Medicine | Admitting: Physical Therapy

## 2021-02-12 ENCOUNTER — Encounter: Payer: Self-pay | Admitting: Physical Therapy

## 2021-02-12 ENCOUNTER — Other Ambulatory Visit: Payer: Self-pay | Admitting: Family Medicine

## 2021-02-12 DIAGNOSIS — M79672 Pain in left foot: Secondary | ICD-10-CM | POA: Diagnosis not present

## 2021-02-12 DIAGNOSIS — R2689 Other abnormalities of gait and mobility: Secondary | ICD-10-CM | POA: Insufficient documentation

## 2021-02-12 DIAGNOSIS — G8929 Other chronic pain: Secondary | ICD-10-CM | POA: Insufficient documentation

## 2021-02-12 DIAGNOSIS — M25562 Pain in left knee: Secondary | ICD-10-CM | POA: Insufficient documentation

## 2021-02-12 DIAGNOSIS — R2681 Unsteadiness on feet: Secondary | ICD-10-CM | POA: Insufficient documentation

## 2021-02-12 DIAGNOSIS — M25672 Stiffness of left ankle, not elsewhere classified: Secondary | ICD-10-CM | POA: Diagnosis not present

## 2021-02-12 DIAGNOSIS — M545 Low back pain, unspecified: Secondary | ICD-10-CM | POA: Insufficient documentation

## 2021-02-12 DIAGNOSIS — M62830 Muscle spasm of back: Secondary | ICD-10-CM | POA: Insufficient documentation

## 2021-02-12 NOTE — Therapy (Signed)
[place]. [place], [place], [address] Phone: [phone]   Fax:  [phone]  Physical Therapy Treatment  Patient Details  Name: Michael [name] MRN: [id number] Date of Birth: [birth year] Referring Provider (PT): Hilts   Encounter Date: 02/12/2021   PT End of Session - 02/12/21 0925    Visit Number 2    Number of Visits 17    Date for PT Re-Evaluation 03/12/21    PT Start Time 0845    PT Stop Time 0926    PT Time Calculation (min) 41 min    Activity Tolerance Patient tolerated treatment well;Patient limited by pain    Behavior During Therapy [facility] for tasks assessed/performed           Past Medical History:  Diagnosis Date  . Anxiety   . Depression    Questionable per records. Not on any medication.   Marland Kitchen History of kidney stones   . Hx of echocardiogram    a. Echo 12/13/12: Mild LVH, EF 94-07%, grade 1 diastolic dysfunction, mild LAE, mild RVE, mild RAE  . Hypertension   . Insomnia   . Migraine   . Noncompliance   . OSA (obstructive sleep apnea)    a. Sleep Study 02/2013:  mod OSA, AHI 33 per hour, O2 sat nadir 85%    Past Surgical History:  Procedure Laterality Date  . Admission  04/2012   Malignant HTN, HA.  CT head negative, cardiology consult.    . CYSTOSCOPY WITH RETROGRADE PYELOGRAM, URETEROSCOPY AND STENT PLACEMENT Left 01/24/2019   Procedure: CYSTOSCOPY WITH RETROGRADE PYELOGRAM, URETEROSCOPY AND STENT PLACEMENT;  Surgeon: Cleon Gustin, MD;  Location: [facility];  Service: Urology;  Laterality: Left;  . HOLMIUM LASER APPLICATION Left 6/80/8811   Procedure: HOLMIUM LASER APPLICATION;  Surgeon: Cleon Gustin, MD;  Location: [facility];  Service: Urology;  Laterality: Left;  Marland Kitchen MANDIBLE SURGERY  1998   metal plate    There were no vitals filed for this visit.   Subjective Assessment - 02/12/21 0849    Subjective Today pt reports that he is fine now,  debilitating pain at times in the L heel    Currently in Pain? No/denies    Pain Score 0-No pain                             [facility] Adult PT Treatment/Exercise - 02/12/21 0001      Exercises   Exercises Ankle      Ankle Exercises: Stretches   Plantar Fascia Stretch 3 reps;10 seconds;20 seconds      Ankle Exercises: Aerobic   Recumbent Bike L1 x 4 min    Nustep L3 x4 min LE only      Ankle Exercises: Seated   ABC's 2 reps    Other Seated Ankle Exercises Red Tband PRE's 2x12 each    Other Seated Ankle Exercises Sit fit ROM x20 each                    PT Short Term Goals - 02/12/21 0925      PT SHORT TERM GOAL #1   Title independent with initial HEP    Status Partially Met             PT Long Term Goals - 01/15/21 1625      PT LONG TERM GOAL #1   Title Pt will demonstrate  left ankle DF ROM to at least 15 deg with knee extended    Baseline 3 deg    Time 8    Period Weeks    Status New    Target Date 03/12/21      PT LONG TERM GOAL #2   Title Pt will have increased lumbar extension to at least 50% with  </=1/10 pain    Time 8    Period Weeks    Status New    Target Date 03/12/21      PT LONG TERM GOAL #3   Title Pt will be able to negotiate stair using a safe, alternating pattern without HR and </= 1/10 low back or heel pain    Time 8    Period Weeks    Status New    Target Date 03/12/21      PT LONG TERM GOAL #4   Title Pt will be able to lift and carry groceries without pain    Time 8    Period Weeks    Status New    Target Date 03/12/21      PT LONG TERM GOAL #5   Title Pt will demonstrate improved SLS to at least 15 seconds with eyes open on either leg.    Time 8    Period Weeks    Status New    Target Date 03/12/21                 Plan - 02/12/21 0926    Clinical Impression Statement Pt enters clinic reporting that at times he cant walk due to heel pain some days. L heel pain present with palpation.  Increase discomfort reported with activity, but able to complete all interventions. Increase low back pain after sitting in recumbent bike and NuStep seat. Cues to complete ankle PRE's with full ROM.    Personal Factors and Comorbidities Comorbidity 3+    Comorbidities HTN, Sleep apnea, Headaches/migraines, depression    Examination-Activity Limitations Bed Mobility;Bend;Carry;Dressing;Hygiene/Grooming;Lift;Stand;Stairs;Squat;Locomotion Level;Transfers    Examination-Participation Restrictions Occupation;Community Activity           Patient will benefit from skilled therapeutic intervention in order to improve the following deficits and impairments:  Abnormal gait,Decreased range of motion,Difficulty walking,Increased fascial restricitons,Increased muscle spasms,Decreased endurance,Decreased activity tolerance,Hypomobility,Impaired flexibility,Improper body mechanics,Postural dysfunction,Decreased strength,Decreased mobility,Decreased balance  Visit Diagnosis: Muscle spasm of back  Chronic bilateral low back pain, unspecified whether sciatica present  Stiffness of left ankle, not elsewhere classified     Problem List Patient Active Problem List   Diagnosis Date Noted  . Morbid obesity with body mass index (BMI) of 40.0 to 44.9 in adult [facility]) 09/21/2019  . CHF (congestive heart failure), NYHA class I, acute, diastolic ([place]) 77/10/6578  . Central sleep apnea comorbid with prescribed opioid use 09/21/2019  . History of uncontrolled hypertension 09/21/2019  . Complex sleep apnea syndrome 08/18/2019  . Claustrophobia 08/18/2019  . Anxiety associated with depression 08/18/2019  . Hypokalemia 04/04/2017  . Morbid obesity ([place]) 04/04/2017  . Chest wall pain 04/04/2017  . Asthma 02/13/2016  . Non compliance with medical treatment 06/13/2015  . OSA (obstructive sleep apnea) 04/06/2013  . Abnormal EKG 11/08/2012  . Essential hypertension   . HTN (hypertension), malignant 04/29/2012  .  Migraine headache with aura 04/29/2012    Scot Jun 02/12/2021, 9:29 AM  [place]. [place], [place], [address] Phone: [phone]   Fax:  [phone]  Name: Michael [name] MRN:  [id number] Date of Birth: May 14, 1971

## 2021-02-13 ENCOUNTER — Other Ambulatory Visit: Payer: Self-pay | Admitting: Family Medicine

## 2021-02-13 DIAGNOSIS — I10 Essential (primary) hypertension: Secondary | ICD-10-CM

## 2021-02-13 NOTE — Telephone Encounter (Signed)
Requested Prescriptions  Pending Prescriptions Disp Refills  . lisinopril (ZESTRIL) 40 MG tablet [Pharmacy Med Name: Lisinopril 40mg  Tablet] 90 tablet 1    Sig: Take 1 tablet by mouth every day     Cardiovascular:  ACE Inhibitors Failed - 02/13/2021  1:02 AM      Failed - Last BP in normal range    BP Readings from Last 1 Encounters:  11/26/20 (!) 157/106         Passed - Cr in normal range and within 180 days    Creat  Date Value Ref Range Status  06/03/2016 1.07 0.60 - 1.35 mg/dL Final   Creatinine, Ser  Date Value Ref Range Status  11/16/2020 0.94 0.61 - 1.24 mg/dL Final         Passed - K in normal range and within 180 days    Potassium  Date Value Ref Range Status  11/16/2020 3.6 3.5 - 5.1 mmol/L Final         Passed - Patient is not pregnant      Passed - Valid encounter within last 6 months    Recent Outpatient Visits          1 month ago Cough   [facility], Ranell Patrick, MD   2 months ago Chronic migraine without aura without status migrainosus, not intractable   [facility], [place], NP   3 months ago Acute pain of left knee   [facility], [place], NP   4 months ago Chronic migraine without aura without status migrainosus, not intractable   [facility] at [facility], [place], MD   6 months ago Possible exposure to STD   [facility], Ranell Patrick, MD             . chlorthalidone (HYGROTON) 50 MG tablet [Pharmacy Med Name: Chlorthalidone 50mg  Tablet] 90 tablet 1    Sig: Take 1 tablet by mouth every day     Cardiovascular: Diuretics - Thiazide Failed - 02/13/2021  1:02 AM      Failed - Ca in normal range and within 360 days    Calcium  Date Value Ref Range Status  11/16/2020 8.8 (L) 8.9 - 10.3 mg/dL Final   Calcium, Ion  Date Value Ref Range Status  04/01/2020 1.24 1.15 - 1.40 mmol/L Final         Failed - Last BP in normal range    BP Readings from Last 1 Encounters:   11/26/20 (!) 157/106         Passed - Cr in normal range and within 360 days    Creat  Date Value Ref Range Status  06/03/2016 1.07 0.60 - 1.35 mg/dL Final   Creatinine, Ser  Date Value Ref Range Status  11/16/2020 0.94 0.61 - 1.24 mg/dL Final         Passed - K in normal range and within 360 days    Potassium  Date Value Ref Range Status  11/16/2020 3.6 3.5 - 5.1 mmol/L Final         Passed - Na in normal range and within 360 days    Sodium  Date Value Ref Range Status  11/16/2020 140 135 - 145 mmol/L Final  07/20/2020 142 134 - 144 mmol/L Final         Passed - Valid encounter within last 6 months    Recent Outpatient Visits  1 month ago Cough   [facility] [place], MD   2 months ago Chronic migraine without aura without status migrainosus, not intractable   [facility], [place], NP   3 months ago Acute pain of left knee   [facility], [place], NP   4 months ago Chronic migraine without aura without status migrainosus, not intractable   [facility] at [facility], Ines Bloomer, MD   6 months ago Possible exposure to STD   [facility], Ranell Patrick, MD

## 2021-02-19 ENCOUNTER — Other Ambulatory Visit: Payer: Self-pay

## 2021-02-19 ENCOUNTER — Ambulatory Visit: Payer: Medicare Other | Admitting: Physical Therapy

## 2021-02-19 ENCOUNTER — Encounter: Payer: Self-pay | Admitting: Physical Therapy

## 2021-02-19 DIAGNOSIS — R2681 Unsteadiness on feet: Secondary | ICD-10-CM | POA: Diagnosis not present

## 2021-02-19 DIAGNOSIS — M79672 Pain in left foot: Secondary | ICD-10-CM | POA: Diagnosis not present

## 2021-02-19 DIAGNOSIS — M545 Low back pain, unspecified: Secondary | ICD-10-CM | POA: Diagnosis not present

## 2021-02-19 DIAGNOSIS — M25672 Stiffness of left ankle, not elsewhere classified: Secondary | ICD-10-CM

## 2021-02-19 DIAGNOSIS — R2689 Other abnormalities of gait and mobility: Secondary | ICD-10-CM | POA: Diagnosis not present

## 2021-02-19 DIAGNOSIS — M62830 Muscle spasm of back: Secondary | ICD-10-CM | POA: Diagnosis not present

## 2021-02-19 DIAGNOSIS — G8929 Other chronic pain: Secondary | ICD-10-CM

## 2021-02-19 DIAGNOSIS — M25562 Pain in left knee: Secondary | ICD-10-CM | POA: Diagnosis not present

## 2021-02-19 NOTE — Therapy (Signed)
[place] [place], [place], [address] Phone: [phone]   Fax:  [phone]  Physical Therapy Treatment  Patient Details  Name: Michael [name] MRN: [id number] Date of Birth: 07-28-1971 Referring Provider (PT): Hilts   Encounter Date: 02/19/2021   PT End of Session - 02/19/21 1030    Visit Number 3    Number of Visits 17    Date for PT Re-Evaluation 03/12/21    PT Start Time 0945    PT Stop Time 1033    PT Time Calculation (min) 48 min    Activity Tolerance Patient tolerated treatment well;Patient limited by pain    Behavior During Therapy [facility] for tasks assessed/performed           Past Medical History:  Diagnosis Date  . Anxiety   . Depression    Questionable per records. Not on any medication.   Marland Kitchen History of kidney stones   . Hx of echocardiogram    a. Echo 12/13/12: Mild LVH, EF 64-68%, grade 1 diastolic dysfunction, mild LAE, mild RVE, mild RAE  . Hypertension   . Insomnia   . Migraine   . Noncompliance   . OSA (obstructive sleep apnea)    a. Sleep Study 02/2013:  mod OSA, AHI 33 per hour, O2 sat nadir 85%    Past Surgical History:  Procedure Laterality Date  . Admission  04/2012   Malignant HTN, HA.  CT head negative, cardiology consult.    . CYSTOSCOPY WITH RETROGRADE PYELOGRAM, URETEROSCOPY AND STENT PLACEMENT Left 01/24/2019   Procedure: CYSTOSCOPY WITH RETROGRADE PYELOGRAM, URETEROSCOPY AND STENT PLACEMENT;  Surgeon: Cleon Gustin, MD;  Location: [facility];  Service: Urology;  Laterality: Left;  . HOLMIUM LASER APPLICATION Left 0/32/1224   Procedure: HOLMIUM LASER APPLICATION;  Surgeon: Cleon Gustin, MD;  Location: [facility];  Service: Urology;  Laterality: Left;  Marland Kitchen MANDIBLE SURGERY  1998   metal plate    There were no vitals filed for this visit.   Subjective Assessment - 02/19/21 0952    Subjective Patient reports that he is doing alright,  reports that when it is cold outside the back hurts more, it is 60 degrees outside now.  Reports that the foot pain comes and goes    Currently in Pain? Yes    Pain Score 8     Pain Location Back    Pain Orientation Lower    Pain Descriptors / Indicators Aching;Sore    Aggravating Factors  cold weather    Pain Relieving Factors rest                             [facility] Adult PT Treatment/Exercise - 02/19/21 0001      Exercises   Exercises Lumbar      Lumbar Exercises: Stretches   Passive Hamstring Stretch Right;Left;3 reps;20 seconds    Lower Trunk Rotation 4 reps;10 seconds    Piriformis Stretch Right;Left;3 reps;20 seconds    Gastroc Stretch 3 reps;20 seconds      Lumbar Exercises: Machines for Strengthening   Other Lumbar Machine Exercise 20# rows and lats 2x10      Lumbar Exercises: Standing   Other Standing Lumbar Exercises 10# straight arm pulls with cues for posture and core activation      Lumbar Exercises: Supine   Other Supine Lumbar Exercises feet on ball K2C, trunk rotation, small bridges and isometric  abs      Ankle Exercises: Aerobic   Recumbent Bike L1 x 4 min                    PT Short Term Goals - 02/12/21 0925      PT SHORT TERM GOAL #1   Title independent with initial HEP    Status Partially Met             PT Long Term Goals - 02/19/21 1032      PT LONG TERM GOAL #1   Title Pt will demonstrate left ankle DF ROM to at least 15 deg with knee extended    Status On-going      PT LONG TERM GOAL #2   Title Pt will have increased lumbar extension to at least 50% with  </=1/10 pain    Status On-going                 Plan - 02/19/21 1030    Clinical Impression Statement We had the order for the LBP and the evaluating PT included that in the evaluation so I did more for the LBP and his movement in general.  He is very tight in the low back and the LE's.  He seemed to tolerate these exercises today without issue,  may need education on body mechanics as he c/o pain when getting up from supine    PT Next Visit Plan work on strength and function    Consulted and Agree with Plan of Care Patient           Patient will benefit from skilled therapeutic intervention in order to improve the following deficits and impairments:  Abnormal gait,Decreased range of motion,Difficulty walking,Increased fascial restricitons,Increased muscle spasms,Decreased endurance,Decreased activity tolerance,Hypomobility,Impaired flexibility,Improper body mechanics,Postural dysfunction,Decreased strength,Decreased mobility,Decreased balance  Visit Diagnosis: Muscle spasm of back  Chronic bilateral low back pain, unspecified whether sciatica present  Stiffness of left ankle, not elsewhere classified  Other abnormalities of gait and mobility  Unsteadiness on feet  Intractable left heel pain  Chronic pain of left knee     Problem List Patient Active Problem List   Diagnosis Date Noted  . Morbid obesity with body mass index (BMI) of 40.0 to 44.9 in adult [facility]) 09/21/2019  . CHF (congestive heart failure), NYHA class I, acute, diastolic ([place]) 24/23/5361  . Central sleep apnea comorbid with prescribed opioid use 09/21/2019  . History of uncontrolled hypertension 09/21/2019  . Complex sleep apnea syndrome 08/18/2019  . Claustrophobia 08/18/2019  . Anxiety associated with depression 08/18/2019  . Hypokalemia 04/04/2017  . Morbid obesity ([address]) 04/04/2017  . Chest wall pain 04/04/2017  . Asthma 02/13/2016  . Non compliance with medical treatment 06/13/2015  . OSA (obstructive sleep apnea) 04/06/2013  . Abnormal EKG 11/08/2012  . Essential hypertension   . HTN (hypertension), malignant 04/29/2012  . Migraine headache with aura 04/29/2012    Sumner Boast., PT 02/19/2021, 10:33 AM  [place]. [place], [place], [address] Phone: [phone]    Fax:  [phone]  Name: Michael [name] MRN: [id number] Date of Birth: [birth year]

## 2021-02-20 ENCOUNTER — Ambulatory Visit: Payer: Medicare Other | Admitting: Registered Nurse

## 2021-02-21 ENCOUNTER — Ambulatory Visit (INDEPENDENT_AMBULATORY_CARE_PROVIDER_SITE_OTHER): Payer: Medicare Other | Admitting: Podiatry

## 2021-02-21 ENCOUNTER — Other Ambulatory Visit: Payer: Self-pay

## 2021-02-21 ENCOUNTER — Encounter: Payer: Self-pay | Admitting: Podiatry

## 2021-02-21 ENCOUNTER — Ambulatory Visit (INDEPENDENT_AMBULATORY_CARE_PROVIDER_SITE_OTHER): Payer: Medicare Other

## 2021-02-21 DIAGNOSIS — M722 Plantar fascial fibromatosis: Secondary | ICD-10-CM

## 2021-02-21 DIAGNOSIS — M2042 Other hammer toe(s) (acquired), left foot: Secondary | ICD-10-CM

## 2021-02-21 DIAGNOSIS — M2041 Other hammer toe(s) (acquired), right foot: Secondary | ICD-10-CM | POA: Diagnosis not present

## 2021-02-21 MED ORDER — TRIAMCINOLONE ACETONIDE 10 MG/ML IJ SUSP
10.0000 mg | Freq: Once | INTRAMUSCULAR | Status: AC
  Administered 2021-02-21: 10 mg

## 2021-02-21 NOTE — Patient Instructions (Signed)

## 2021-02-24 ENCOUNTER — Encounter: Payer: Self-pay | Admitting: Registered Nurse

## 2021-02-24 NOTE — Progress Notes (Signed)
Subjective:   Patient ID: Michael [name], male   DOB: 50 y.o.   MRN: [id number]   HPI Patient states that she has had a lot of pain in the bottom of the heel left and that its been present for around 6 months. He does not remember injury states the left hurts more than the right and it is worse when he gets up in the morning after sitting. Patient does not smoke likes to be active   Review of Systems  All other systems reviewed and are negative.       Objective:  Physical Exam Vitals and nursing note reviewed.  Constitutional:      Appearance: He is well-developed and well-nourished.  Cardiovascular:     Pulses: Intact distal pulses.  Pulmonary:     Effort: Pulmonary effort is normal.  Musculoskeletal:        General: Normal range of motion.  Skin:    General: Skin is warm.  Neurological:     Mental Status: He is alert.     Neurovascular status found to be intact muscle strength was found to be adequate range of motion adequate. Patient is noted to have exquisite discomfort plantar aspect left heel at the insertional point tendon calcaneus with moderate fluid buildup and flatfoot deformity noted. Patient has good digital perfusion well oriented x3 with mild pain in the right     Assessment:  Acute plantar fasciitis left over right inflammation fluid noted     Plan:  H&P reviewed condition and did sterile prep injected the fascia 3 mg Kenalog 5 mg Xylocaine applied fascial brace left instructed on physical therapy and shoe gear modifications and reappoint to recheck  X-rays indicate depression of the arch bilateral with spur formation left over right no indication of stress fracture

## 2021-02-24 NOTE — Progress Notes (Signed)
Established Patient Office Visit  Subjective:  Patient ID: Michael [name], male    DOB: [birth year]  Age: 50 y.o. MRN: [id number]  CC:  Chief Complaint  Patient presents with  . Hospitalization Follow-up    Patient states that he is here for hospital follow up for hypertension. Per patient he does not think he medication seems to be working,    HPI [employer] presents for hfu Presented to ER with htn No acute cv symptoms, workup there reassuring Feeling better today but states bp medication does not seem to be working as he continues to get high readings Having sleep disturbance as well - trouble falling asleep, trouble staying asleep Notes that he has been taking chlorthalidone 50mg  PO qd, lisinopril 40mg  PO qd, klor-con 43meq dialy.   Past Medical History:  Diagnosis Date  . Anxiety   . Depression    Questionable per records. Not on any medication.   Marland Kitchen History of kidney stones   . Hx of echocardiogram    a. Echo 12/13/12: Mild LVH, EF 83-38%, grade 1 diastolic dysfunction, mild LAE, mild RVE, mild RAE  . Hypertension   . Insomnia   . Migraine   . Noncompliance   . OSA (obstructive sleep apnea)    a. Sleep Study 02/2013:  mod OSA, AHI 33 per hour, O2 sat nadir 85%    Past Surgical History:  Procedure Laterality Date  . Admission  04/2012   Malignant HTN, HA.  CT head negative, cardiology consult.    . CYSTOSCOPY WITH RETROGRADE PYELOGRAM, URETEROSCOPY AND STENT PLACEMENT Left 01/24/2019   Procedure: CYSTOSCOPY WITH RETROGRADE PYELOGRAM, URETEROSCOPY AND STENT PLACEMENT;  Surgeon: Cleon Gustin, MD;  Location: [facility];  Service: Urology;  Laterality: Left;  . HOLMIUM LASER APPLICATION Left 2/50/5397   Procedure: HOLMIUM LASER APPLICATION;  Surgeon: Cleon Gustin, MD;  Location: [facility];  Service: Urology;  Laterality: Left;  Marland Kitchen MANDIBLE SURGERY  1998   metal plate    Family History  Problem Relation Age of Onset  . Cancer  Father        prostate, colon- age was in his 5's  . Heart disease Father 65       MI   . Hypertension Father   . Colon cancer Father   . Diabetes Mother   . Hypertension Mother   . Hypertension Brother   . Cancer Maternal Grandfather   . Cancer Paternal Grandfather   . Esophageal cancer Neg Hx   . Stomach cancer Neg Hx   . Rectal cancer Neg Hx     Social History   Socioeconomic History  . Marital status: Divorced    Spouse name: Not on file  . Number of children: 4  . Years of education: Not on file  . Highest education level: Not on file  Occupational History  . Occupation: Unemployed//disabled  Tobacco Use  . Smoking status: Never Smoker  . Smokeless tobacco: Never Used  Substance and Sexual Activity  . Alcohol use: No    Alcohol/week: 0.0 standard drinks  . Drug use: No  . Sexual activity: Yes    Comment: per pt's blue health form - 1 sex partner in last 12 months  Other Topics Concern  . Not on file  Social History Narrative   Marital status: single; living with fiance      Children: 4 children, no grandchildren.      Employment:  Disability for learning disabilities since 2012.  Right-handed   Caffeine: occasional         Social Determinants of Radio broadcast assistant Strain: Not on file  Food Insecurity: Not on file  Transportation Needs: Not on file  Physical Activity: Not on file  Stress: Not on file  Social Connections: Not on file  Intimate Partner Violence: Not on file    Outpatient Medications Prior to Visit  Medication Sig Dispense Refill  . acetaminophen (TYLENOL) 325 MG tablet Take 650 mg by mouth every 6 (six) hours as needed for mild pain or headache.    Marland Kitchen buPROPion (WELLBUTRIN XL) 150 MG 24 hr tablet Take 1 tablet by mouth every day 90 tablet 1  . meloxicam (MOBIC) 7.5 MG tablet Take 1 tablet (7.5 mg total) by mouth daily. 30 tablet 0  . potassium chloride (KLOR-CON) 10 MEQ tablet Take 1 tablet by mouth every day 90 tablet 3  .  amLODipine (NORVASC) 10 MG tablet Take 1 tablet by mouth every day 90 tablet 1  . chlorthalidone (HYGROTON) 50 MG tablet Take 1 tablet (50 mg total) by mouth daily. 30 tablet 1  . lisinopril (ZESTRIL) 40 MG tablet Take 1 tablet by mouth every day 30 tablet 2   Facility-Administered Medications Prior to Visit  Medication Dose Route Frequency Provider Last Rate Last Admin  . 0.9 %  sodium chloride infusion  500 mL Intravenous Continuous Ladene Artist, MD        Allergies  Allergen Reactions  . Statins Other (See Comments)    unknown    ROS Review of Systems    Objective:    Physical Exam Constitutional:      General: He is not in acute distress.    Appearance: Normal appearance. He is normal weight. He is not ill-appearing, toxic-appearing or diaphoretic.  Cardiovascular:     Rate and Rhythm: Normal rate and regular rhythm.     Heart sounds: Normal heart sounds. No murmur heard. No friction rub. No gallop.   Pulmonary:     Effort: Pulmonary effort is normal. No respiratory distress.     Breath sounds: Normal breath sounds. No stridor. No wheezing, rhonchi or rales.  Chest:     Chest wall: No tenderness.  Neurological:     General: No focal deficit present.     Mental Status: He is alert and oriented to person, place, and time. Mental status is at baseline.  Psychiatric:        Mood and Affect: Mood normal.        Behavior: Behavior normal.        Thought Content: Thought content normal.        Judgment: Judgment normal.     BP (!) 157/106   Pulse 84   Temp 98 F (36.7 C) (Temporal)   Resp 18   Ht 5\' 10"  (1.778 m)   Wt 299 lb 6.4 oz (135.8 kg)   SpO2 98%   BMI 42.96 kg/m  Wt Readings from Last 3 Encounters:  01/03/21 290 lb (131.5 kg)  11/26/20 299 lb 6.4 oz (135.8 kg)  11/16/20 298 lb (135.2 kg)     Health Maintenance Due  Topic Date Due  . COVID-19 Vaccine (3 - Booster for Pfizer series) 10/09/2020    There are no preventive care reminders to  display for this patient.  Lab Results  Component Value Date   TSH 1.100 04/11/2020   Lab Results  Component Value Date   WBC 5.0 11/16/2020  HGB 14.2 11/16/2020   HCT 44.2 11/16/2020   MCV 89.3 11/16/2020   PLT 207 11/16/2020   Lab Results  Component Value Date   NA 140 11/16/2020   K 3.6 11/16/2020   CO2 27 11/16/2020   GLUCOSE 116 (H) 11/16/2020   BUN 13 11/16/2020   CREATININE 0.94 11/16/2020   BILITOT 0.7 11/16/2020   ALKPHOS 55 11/16/2020   AST 17 11/16/2020   ALT 15 11/16/2020   PROT 7.6 11/16/2020   ALBUMIN 4.1 11/16/2020   CALCIUM 8.8 (L) 11/16/2020   ANIONGAP 10 11/16/2020   GFR 112.44 01/28/2013   Lab Results  Component Value Date   CHOL 159 03/22/2020   Lab Results  Component Value Date   HDL 44 03/22/2020   Lab Results  Component Value Date   LDLCALC 100 (H) 03/22/2020   Lab Results  Component Value Date   TRIG 79 03/22/2020   Lab Results  Component Value Date   CHOLHDL 3.6 03/22/2020   Lab Results  Component Value Date   HGBA1C 5.6 06/13/2015      Assessment & Plan:   Problem List Items Addressed This Visit      Cardiovascular and Mediastinum   Essential hypertension   Relevant Medications   propranolol ER (INDERAL LA) 80 MG 24 hr capsule    Other Visit Diagnoses    Chronic migraine without aura without status migrainosus, not intractable    -  Primary   Relevant Medications   propranolol ER (INDERAL LA) 80 MG 24 hr capsule   Sleep disturbance       Relevant Medications   QUEtiapine (SEROQUEL) 50 MG tablet      Meds ordered this encounter  Medications  . propranolol ER (INDERAL LA) 80 MG 24 hr capsule    Sig: Take 1 capsule (80 mg total) by mouth daily.    Dispense:  90 capsule    Refill:  0    Order Specific Question:   Supervising Provider    Answer:   Carlota Raspberry, JEFFREY R [[id number]]  . QUEtiapine (SEROQUEL) 50 MG tablet    Sig: Take 1 tablet (50 mg total) by mouth at bedtime.    Dispense:  90 tablet    Refill:  0     Order Specific Question:   Supervising Provider    Answer:   Carlota Raspberry, JEFFREY R [[id number]]    Follow-up: No follow-ups on file.   PLAN  No acute findings on exam  Can add propranolol 80mg  PO qd for bp and headache prevention  Follow up in 1-2 weeks for nurse visit bp check  seroquel 25-50mg  PO qhs prn for sleep  Patient encouraged to call clinic with any questions, comments, or concerns.  Maximiano Coss, NP

## 2021-02-26 ENCOUNTER — Ambulatory Visit: Payer: Medicare Other | Admitting: Physical Therapy

## 2021-02-27 ENCOUNTER — Encounter: Payer: Self-pay | Admitting: Family Medicine

## 2021-02-27 ENCOUNTER — Ambulatory Visit (INDEPENDENT_AMBULATORY_CARE_PROVIDER_SITE_OTHER): Payer: Medicare Other | Admitting: Family Medicine

## 2021-02-27 ENCOUNTER — Other Ambulatory Visit: Payer: Self-pay

## 2021-02-27 VITALS — BP 125/88 | HR 82 | Temp 98.4°F | Ht 70.0 in | Wt 302.0 lb

## 2021-02-27 DIAGNOSIS — K5909 Other constipation: Secondary | ICD-10-CM

## 2021-02-27 DIAGNOSIS — F32A Depression, unspecified: Secondary | ICD-10-CM | POA: Diagnosis not present

## 2021-02-27 DIAGNOSIS — K6289 Other specified diseases of anus and rectum: Secondary | ICD-10-CM

## 2021-02-27 DIAGNOSIS — I10 Essential (primary) hypertension: Secondary | ICD-10-CM | POA: Diagnosis not present

## 2021-02-27 DIAGNOSIS — R739 Hyperglycemia, unspecified: Secondary | ICD-10-CM | POA: Diagnosis not present

## 2021-02-27 MED ORDER — LIDOCAINE (ANORECTAL) 5 % EX GEL: CUTANEOUS | 0 refills | Status: DC

## 2021-02-27 MED ORDER — HYDROCORTISONE ACETATE 25 MG RE SUPP: 25.0000 mg | Freq: Two times a day (BID) | RECTAL | 0 refills | Status: DC

## 2021-02-27 MED ORDER — BUPROPION HCL ER (XL) 300 MG PO TB24: 300.0000 mg | ORAL_TABLET | Freq: Every day | ORAL | 1 refills | Status: DC

## 2021-02-27 MED ORDER — AMLODIPINE BESYLATE 10 MG PO TABS: 10.0000 mg | ORAL_TABLET | Freq: Every day | ORAL | 1 refills | Status: DC

## 2021-02-27 NOTE — Progress Notes (Signed)
Subjective:  Patient ID: Michael [name], male    DOB: 04/02/1971  Age: 50 y.o. MRN: [id number]  CC:  Chief Complaint  Patient presents with  . Constipation    PT reports feeling constipated for the past week pt is wondering if the provider could give him something for that. Pt reports noticing blood in his stool,but was told by someone it could be from his constipation.  . Follow-up    On recent lab work.  . Depression    PHQ-9 score of 16 with suicidal thoughts.     HPI Michael [name] presents for  Initially follow up but acute concerns above.   Constipation: Past 1 week - took ex lax, no recent treatments.  Noticed blood in stool after hard BM last week. Soreness at anus - no relief with hemorrhoid wipe. Last bm 2 days ago - softer.  Still sore at anal area.   Depression: Takes Wellbutrin XL 150 mg daily, Seroquel 50 mg nightly.  Trazodone used in the past, stopped due to sedation. Has therapist - has not seen recently.  Feels more depressed past few weeks, left work due to some health issues in legs, back.  No missed doses of meds.  SI -few weeks ago, when left work. Doing better now - has help for rent now. No SI recently. No intent/plan.  Getting to sleep better.    Depression screen [facility] 2/9 02/27/2021 01/03/2021 11/26/2020 10/24/2020 10/02/2020  Decreased Interest 0 0 0 0 0  Down, Depressed, Hopeless 3 0 0 0 0  PHQ - 2 Score 3 0 0 0 0  Altered sleeping 2 - - - -  Tired, decreased energy 3 - - - -  Change in appetite 3 - - - -  Feeling bad or failure about yourself  2 - - - -  Trouble concentrating 1 - - - -  Moving slowly or fidgety/restless 1 - - - -  Suicidal thoughts 1 - - - -  PHQ-9 Score 16 - - - -  Difficult doing work/chores - - - - -  Some recent data might be hidden   Hypertension: With hx of CHF.  Treated with Norvasc 10 mg daily, carvedilol 6.25 mg twice daily, chlorthalidone 50 mg daily, lisinopril 40 mg daily, potassium 10 mEq daily denies any chest pain,  dyspnea, palpitations or leg swelling.  No new side effects of medications. BP Readings from Last 3 Encounters:  02/27/21 125/88  11/26/20 (!) 157/106  11/16/20 (!) 150/104   Lab Results  Component Value Date   CREATININE 0.94 11/16/2020   Hyperglycemia: Glucose 116 in 10/2020.  Lab Results  Component Value Date   HGBA1C 5.6 06/13/2015     History Patient Active Problem List   Diagnosis Date Noted  . Morbid obesity with body mass index (BMI) of 40.0 to 44.9 in adult [facility]) 09/21/2019  . CHF (congestive heart failure), NYHA class I, acute, diastolic ([place]) 22/29/7989  . Central sleep apnea comorbid with prescribed opioid use 09/21/2019  . History of uncontrolled hypertension 09/21/2019  . Complex sleep apnea syndrome 08/18/2019  . Claustrophobia 08/18/2019  . Anxiety associated with depression 08/18/2019  . Hypokalemia 04/04/2017  . Morbid obesity ([place]) 04/04/2017  . Chest wall pain 04/04/2017  . Asthma 02/13/2016  . Non compliance with medical treatment 06/13/2015  . OSA (obstructive sleep apnea) 04/06/2013  . Abnormal EKG 11/08/2012  . Essential hypertension   . HTN (hypertension), malignant 04/29/2012  . Migraine headache with aura 04/29/2012  Past Medical History:  Diagnosis Date  . Anxiety   . Depression    Questionable per records. Not on any medication.   Marland Kitchen History of kidney stones   . Hx of echocardiogram    a. Echo 12/13/12: Mild LVH, EF 00-93%, grade 1 diastolic dysfunction, mild LAE, mild RVE, mild RAE  . Hypertension   . Insomnia   . Migraine   . Noncompliance   . OSA (obstructive sleep apnea)    a. Sleep Study 02/2013:  mod OSA, AHI 33 per hour, O2 sat nadir 85%   Past Surgical History:  Procedure Laterality Date  . Admission  04/2012   Malignant HTN, HA.  CT head negative, cardiology consult.    . CYSTOSCOPY WITH RETROGRADE PYELOGRAM, URETEROSCOPY AND STENT PLACEMENT Left 01/24/2019   Procedure: CYSTOSCOPY WITH RETROGRADE PYELOGRAM, URETEROSCOPY AND  STENT PLACEMENT;  Surgeon: Cleon Gustin, MD;  Location: [facility];  Service: Urology;  Laterality: Left;  . HOLMIUM LASER APPLICATION Left 08/15/2992   Procedure: HOLMIUM LASER APPLICATION;  Surgeon: Cleon Gustin, MD;  Location: [facility];  Service: Urology;  Laterality: Left;  Marland Kitchen MANDIBLE SURGERY  1998   metal plate   Allergies  Allergen Reactions  . Statins Other (See Comments)    unknown   Prior to Admission medications   Medication Sig Start Date End Date Taking? Authorizing Provider  acetaminophen (TYLENOL) 325 MG tablet Take 650 mg by mouth every 6 (six) hours as needed for mild pain or headache.   Yes [provider]  amLODipine (NORVASC) 10 MG tablet Take 1 tablet by mouth every day 12/15/20  Yes [name], MD  baclofen (LIORESAL) 10 MG tablet Take 0.5-1 tablets (5-10 mg total) by mouth 3 (three) times daily as needed for muscle spasms. 12/27/20  Yes Hilts, Legrand Como, MD  benzonatate (TESSALON) 100 MG capsule Take 1 capsule (100 mg total) by mouth 3 (three) times daily as needed for cough. 01/03/21  Yes [name], MD  buPROPion (WELLBUTRIN XL) 150 MG 24 hr tablet Take 1 tablet by mouth every day 10/16/20  Yes [name], MD  carvedilol (COREG) 6.25 MG tablet Take 1 tablet by mouth twice daily 02/12/21  Yes [name], MD  celecoxib (CELEBREX) 200 MG capsule Take 1 capsule (200 mg total) by mouth 2 (two) times daily as needed. 12/27/20  Yes Hilts, Legrand Como, MD  chlorthalidone (HYGROTON) 50 MG tablet Take 1 tablet by mouth every day 02/13/21  Yes Maximiano Coss, NP  lisinopril (ZESTRIL) 40 MG tablet Take 1 tablet by mouth every day 02/13/21  Yes Maximiano Coss, NP  meloxicam (MOBIC) 7.5 MG tablet Take 1 tablet (7.5 mg total) by mouth daily. 10/24/20  Yes Maximiano Coss, NP  potassium chloride (KLOR-CON) 10 MEQ tablet Take 1 tablet by mouth every day 09/16/20  Yes [name], MD  propranolol ER (INDERAL  LA) 80 MG 24 hr capsule Take 1 capsule (80 mg total) by mouth daily. 11/26/20  Yes Maximiano Coss, NP  QUEtiapine (SEROQUEL) 50 MG tablet Take 1 tablet (50 mg total) by mouth at bedtime. 11/26/20  Yes Maximiano Coss, NP   Social History   Socioeconomic History  . Marital status: Divorced    Spouse name: Not on file  . Number of children: 4  . Years of education: Not on file  . Highest education level: Not on file  Occupational History  . Occupation: Unemployed//disabled  Tobacco Use  . Smoking status: Never  Smoker  . Smokeless tobacco: Never Used  Substance and Sexual Activity  . Alcohol use: No    Alcohol/week: 0.0 standard drinks  . Drug use: No  . Sexual activity: Yes    Comment: per pt's blue health form - 1 sex partner in last 12 months  Other Topics Concern  . Not on file  Social History Narrative   Marital status: single; living with fiance      Children: 4 children, no grandchildren.      Employment:  Disability for learning disabilities since 2012.   Right-handed   Caffeine: occasional         Social Determinants of Radio broadcast assistant Strain: Not on file  Food Insecurity: Not on file  Transportation Needs: Not on file  Physical Activity: Not on file  Stress: Not on file  Social Connections: Not on file  Intimate Partner Violence: Not on file    Review of Systems Per HPI.   Objective:   Vitals:   02/27/21 1047  BP: 125/88  Pulse: 82  Temp: 98.4 F (36.9 C)  TempSrc: Temporal  SpO2: 97%  Weight: (!) 302 lb (137 kg)  Height: 5\' 10"  (1.778 m)     Physical Exam Vitals reviewed.  Constitutional:      Appearance: He is well-developed and well-nourished.  HENT:     Head: Normocephalic and atraumatic.  Eyes:     Extraocular Movements: EOM normal.     Pupils: Pupils are equal, round, and reactive to light.  Neck:     Vascular: No carotid bruit or JVD.  Cardiovascular:     Rate and Rhythm: Normal rate and regular rhythm.     Heart  sounds: Normal heart sounds. No murmur heard.   Pulmonary:     Effort: Pulmonary effort is normal.     Breath sounds: Normal breath sounds. No rales.  Genitourinary:    Comments: Small nonthrombosed hemorrhoid at 12:00.  Slight discomfort in that area as well without visible fissure but somewhat difficult exam.  No bleeding, no thrombosed hemorrhoids noted on exam. Musculoskeletal:        General: No edema.  Skin:    General: Skin is warm and dry.  Neurological:     Mental Status: He is alert and oriented to person, place, and time.  Psychiatric:        Mood and Affect: Mood and affect normal.        Assessment & Plan:  Gustavo Slatter is a 50 y.o. male . Essential hypertension - Plan: amLODipine (NORVASC) 10 MG tablet, Basic metabolic panel  -Stable, continue same regimen, amlodipine refilled, continue other meds as above.  Check labs.  Depression, unspecified depression type - Plan: buPROPion (WELLBUTRIN XL) 300 MG 24 hr tablet  -Increase symptoms recently, fleeting suicidal ideation without intent or plan as above, denies current symptoms of suicidal ideation.  Will try higher dose of Wellbutrin but also recommended he contact his therapist with RTC/ER precautions given.  Recheck 6 weeks.  Hyperglycemia - Plan: Hemoglobin A1c  -Check A1c with recent hyperglycemia.  Other constipation Anal pain - Plan: Lidocaine, Anorectal, 5 % GEL, hydrocortisone (ANUSOL-HC) 25 MG suppository  -Possible anal fissure versus nonthrombosed hemorrhoid as cause of discomfort.  Constipation prevention discussed, handout given.  Topical lidocaine as needed and Anusol HC temporarily with RTC precautions.  Meds ordered this encounter  Medications  . amLODipine (NORVASC) 10 MG tablet    Sig: Take 1 tablet (10 mg total) by mouth  daily.    Dispense:  90 tablet    Refill:  1  . buPROPion (WELLBUTRIN XL) 300 MG 24 hr tablet    Sig: Take 1 tablet (300 mg total) by mouth daily.    Dispense:  90 tablet     Refill:  1  . Lidocaine, Anorectal, 5 % GEL    Sig: Apply pea-sized amount to the perianal skin up to 3 times per day as needed.    Dispense:  30 g    Refill:  0  . hydrocortisone (ANUSOL-HC) 25 MG suppository    Sig: Place 1 suppository (25 mg total) rectally 2 (two) times daily.    Dispense:  12 suppository    Refill:  0   Patient Instructions   Call your therapist about depression, but can try higher dose wellbutrin. Recheck in next 6 weeks.  Return to the clinic or go to the nearest emergency room if any of your symptoms worsen or new symptoms occur.  miralax if needed for constipation.  Lidocaine gel to external area of anus prior to bowel movements or if sore.  Small amount only.  Try Anusol suppository for possible hemorrhoids.  If discomfort in that area is not improving in the next 1 week, return for recheck.  No med changes today, I will check some labs.  Follow-up in 6 weeks.  Return to the clinic or go to the nearest emergency room if any of your symptoms worsen or new symptoms occur.    Constipation, Adult Constipation is when a person has fewer than three bowel movements in a week, has difficulty having a bowel movement, or has stools (feces) that are dry, hard, or larger than normal. Constipation may be caused by an underlying condition. It may become worse with age if a person takes certain medicines and does not take in enough fluids. Follow these instructions at home: Eating and drinking  Eat foods that have a lot of fiber, such as beans, whole grains, and fresh fruits and vegetables.  Limit foods that are low in fiber and high in fat and processed sugars, such as fried or sweet foods. These include french fries, hamburgers, cookies, candies, and soda.  Drink enough fluid to keep your urine pale yellow.   General instructions  Exercise regularly or as told by your health care provider. Try to do 150 minutes of moderate exercise each week.  Use the bathroom when  you have the urge to go. Do not hold it in.  Take over-the-counter and prescription medicines only as told by your health care provider. This includes any fiber supplements.  During bowel movements: ? Practice deep breathing while relaxing the lower abdomen. ? Practice pelvic floor relaxation.  Watch your condition for any changes. Let your health care provider know about them.  Keep all follow-up visits as told by your health care provider. This is important. Contact a health care provider if:  You have pain that gets worse.  You have a fever.  You do not have a bowel movement after 4 days.  You vomit.  You are not hungry or you lose weight.  You are bleeding from the opening between the buttocks (anus).  You have thin, pencil-like stools. Get help right away if:  You have a fever and your symptoms suddenly get worse.  You leak stool or have blood in your stool.  Your abdomen is bloated.  You have severe pain in your abdomen.  You feel dizzy or you faint. Summary  Constipation is when a person has fewer than three bowel movements in a week, has difficulty having a bowel movement, or has stools (feces) that are dry, hard, or larger than normal.  Eat foods that have a lot of fiber, such as beans, whole grains, and fresh fruits and vegetables.  Drink enough fluid to keep your urine pale yellow.  Take over-the-counter and prescription medicines only as told by your health care provider. This includes any fiber supplements. This information is not intended to replace advice given to you by your health care provider. Make sure you discuss any questions you have with your health care provider. Document Revised: 11/02/2019 Document Reviewed: 11/02/2019 Elsevier Patient Education  2021 [employer].   If you have lab work done today you will be contacted with your lab results within the next 2 weeks.  If you have not heard from [place] then please contact us. The fastest way to  get your results is to register for My Chart.   IF you received an x-ray today, you will receive an invoice from [facility]. Please contact [facility] at [phone] with questions or concerns regarding your invoice.   IF you received labwork today, you will receive an invoice from [place]. Please contact [facility] at [phone] with questions or concerns regarding your invoice.   Our billing staff will not be able to assist you with questions regarding bills from these companies.  You will be contacted with the lab results as soon as they are available. The fastest way to get your results is to activate your My Chart account. Instructions are located on the last page of this paperwork. If you have not heard from [place] regarding the results in 2 weeks, please contact this office.         Signed, [name], MD [facility]

## 2021-02-27 NOTE — Patient Instructions (Addendum)
Call your therapist about depression, but can try higher dose wellbutrin. Recheck in next 6 weeks.  Return to the clinic or go to the nearest emergency room if any of your symptoms worsen or new symptoms occur.  miralax if needed for constipation.  Lidocaine gel to external area of anus prior to bowel movements or if sore.  Small amount only.  Try Anusol suppository for possible hemorrhoids.  If discomfort in that area is not improving in the next 1 week, return for recheck.  No med changes today, I will check some labs.  Follow-up in 6 weeks.  Return to the clinic or go to the nearest emergency room if any of your symptoms worsen or new symptoms occur.    Constipation, Adult Constipation is when a person has fewer than three bowel movements in a week, has difficulty having a bowel movement, or has stools (feces) that are dry, hard, or larger than normal. Constipation may be caused by an underlying condition. It may become worse with age if a person takes certain medicines and does not take in enough fluids. Follow these instructions at home: Eating and drinking  Eat foods that have a lot of fiber, such as beans, whole grains, and fresh fruits and vegetables.  Limit foods that are low in fiber and high in fat and processed sugars, such as fried or sweet foods. These include french fries, hamburgers, cookies, candies, and soda.  Drink enough fluid to keep your urine pale yellow.   General instructions  Exercise regularly or as told by your health care provider. Try to do 150 minutes of moderate exercise each week.  Use the bathroom when you have the urge to go. Do not hold it in.  Take over-the-counter and prescription medicines only as told by your health care provider. This includes any fiber supplements.  During bowel movements: ? Practice deep breathing while relaxing the lower abdomen. ? Practice pelvic floor relaxation.  Watch your condition for any changes. Let your health care  provider know about them.  Keep all follow-up visits as told by your health care provider. This is important. Contact a health care provider if:  You have pain that gets worse.  You have a fever.  You do not have a bowel movement after 4 days.  You vomit.  You are not hungry or you lose weight.  You are bleeding from the opening between the buttocks (anus).  You have thin, pencil-like stools. Get help right away if:  You have a fever and your symptoms suddenly get worse.  You leak stool or have blood in your stool.  Your abdomen is bloated.  You have severe pain in your abdomen.  You feel dizzy or you faint. Summary  Constipation is when a person has fewer than three bowel movements in a week, has difficulty having a bowel movement, or has stools (feces) that are dry, hard, or larger than normal.  Eat foods that have a lot of fiber, such as beans, whole grains, and fresh fruits and vegetables.  Drink enough fluid to keep your urine pale yellow.  Take over-the-counter and prescription medicines only as told by your health care provider. This includes any fiber supplements. This information is not intended to replace advice given to you by your health care provider. Make sure you discuss any questions you have with your health care provider. Document Revised: 11/02/2019 Document Reviewed: 11/02/2019 Elsevier Patient Education  2021 [employer].   If you have lab work done  today you will be contacted with your lab results within the next 2 weeks.  If you have not heard from [place] then please contact us. The fastest way to get your results is to register for My Chart.   IF you received an x-ray today, you will receive an invoice from [facility]. Please contact [facility] at [phone] with questions or concerns regarding your invoice.   IF you received labwork today, you will receive an invoice from [place]. Please contact [facility] at [phone]  with questions or concerns regarding your invoice.   Our billing staff will not be able to assist you with questions regarding bills from these companies.  You will be contacted with the lab results as soon as they are available. The fastest way to get your results is to activate your My Chart account. Instructions are located on the last page of this paperwork. If you have not heard from [place] regarding the results in 2 weeks, please contact this office.

## 2021-02-28 LAB — BASIC METABOLIC PANEL
BUN/Creatinine Ratio: 20 (ref 9–20)
BUN: 25 mg/dL — ABNORMAL HIGH (ref 6–24)
CO2: 25 mmol/L (ref 20–29)
Calcium: 9.4 mg/dL (ref 8.7–10.2)
Chloride: 99 mmol/L (ref 96–106)
Creatinine, Ser: 1.28 mg/dL — ABNORMAL HIGH (ref 0.76–1.27)
Glucose: 94 mg/dL (ref 65–99)
Potassium: 3.5 mmol/L (ref 3.5–5.2)
Sodium: 140 mmol/L (ref 134–144)
eGFR: 68 mL/min/{1.73_m2} (ref >59)

## 2021-02-28 LAB — HEMOGLOBIN A1C
Est. average glucose Bld gHb Est-mCnc: 126 mg/dL
Hgb A1c MFr Bld: 6 % — ABNORMAL HIGH (ref 4.8–5.6)

## 2021-03-07 ENCOUNTER — Other Ambulatory Visit: Payer: Self-pay

## 2021-03-07 ENCOUNTER — Encounter: Payer: Self-pay | Admitting: Podiatry

## 2021-03-07 ENCOUNTER — Ambulatory Visit (INDEPENDENT_AMBULATORY_CARE_PROVIDER_SITE_OTHER): Payer: Medicare Other | Admitting: Podiatry

## 2021-03-07 DIAGNOSIS — M722 Plantar fascial fibromatosis: Secondary | ICD-10-CM | POA: Diagnosis not present

## 2021-03-07 MED ORDER — TRIAMCINOLONE ACETONIDE 10 MG/ML IJ SUSP
10.0000 mg | Freq: Once | INTRAMUSCULAR | Status: AC
  Administered 2021-03-07: 10 mg

## 2021-03-07 NOTE — Progress Notes (Signed)
Subjective:   Patient ID: Michael [name], male   DOB: 50 y.o.   MRN: [id number]   HPI Patient states that his heel has remained intensely sore stating he has short-term relief but it has come back pretty much full force   ROS      Objective:  Physical Exam  Neurovascular status intact with exquisite discomfort medial and central band of the fascia left at the insertional point tendon calcaneus     Assessment:  Acute plantar fasciitis left inflammation fluid buildup     Plan:  H&P reviewed condition went ahead today did do sterile prep injected the plantar fascia 3 mg Kenalog 5 mg Xylocaine and I then went ahead and applied air fracture walker to completely immobilize the plantar heel along with instructions for reduced activity.  May require more aggressive treatment but hopefully this will solve the problem without requiring surgery or shockwave therapy

## 2021-03-11 ENCOUNTER — Ambulatory Visit (INDEPENDENT_AMBULATORY_CARE_PROVIDER_SITE_OTHER): Payer: Medicare Other | Admitting: Family Medicine

## 2021-03-11 ENCOUNTER — Other Ambulatory Visit: Payer: Self-pay

## 2021-03-11 DIAGNOSIS — G8929 Other chronic pain: Secondary | ICD-10-CM | POA: Diagnosis not present

## 2021-03-11 DIAGNOSIS — M545 Low back pain, unspecified: Secondary | ICD-10-CM

## 2021-03-11 MED ORDER — TIZANIDINE HCL 2 MG PO TABS: 2.0000 mg | ORAL_TABLET | Freq: Four times a day (QID) | ORAL | 1 refills | Status: DC | PRN

## 2021-03-11 NOTE — Progress Notes (Signed)
I saw and examined the patient with Dr. Elouise Munroe and agree with assessment and plan as outlined.    Ongoing right LBP, not improving with chiropractic.  Will proceed with x-rays and MRI at [place].  Consider SI injection or epidural injection depending on findings.

## 2021-03-11 NOTE — Progress Notes (Signed)
Office Visit Note   Patient: Michael [name]           Date of Birth: [birth year]           MRN: [id number] Visit Date: 03/11/2021 Requested by: [name], MD [address] [place],  [place] [address] PCP: [name], MD  Subjective: Chief Complaint  Patient presents with  . Lower Back - Pain    Going to PT here, not helping his back pain.     HPI: 50yo M presenting to clinic with concerns about ongoing lower back pain. Patient states his pain is primarily in the right side of his lower back, and will occasionally travel down his right leg (esp when going up stairs). He tried physical therapy, but didn't feel like it was making a significant difference. Since his previous appointment, he has stopped working at [employer], due to Knee, foot, and back pain. His knee pain has improved without standing on it all day, his foot is now followed by podiatry, who just performed plantar fascia injections.  He says he continues to take tylenol regularly, but doesn't feel like the baclofen offers any improvement. No numbness, no bowel/bladder dysfunction.              ROS:   All other systems were reviewed and are negative.  Objective: Vital Signs: There were no vitals taken for this visit.  Physical Exam:  General:  Alert and oriented, in no acute distress. Pulm:  Breathing unlabored. Psy:  Normal mood, congruent affect. Skin:  Lower back overlying skin intact. No bruising, rashes, or erythema.   BACK EXAMINATION: Normal Gait.  Sits with shoulders rolled forward.  ROM: Spinal ROM testing limited due to difficulty bearing weight on recently injected foot.  Palpation: Endorses significant tenderness to palpation at right SI joint, and L5 right paraspinal muscles.  No midline tenderness, no deformity or step-offs.   Strength: Hip flexion (L1), Hip Aduction (L2), Knee Extension (L3) are 5/5 Bilaterally Foot Inversion (L4), Dorsiflexion (L5), and Eversion (S1) 5/5 on right (did not check  left foot due to recent injection).  Sensation: Intact to light touch medial and lateral aspects of lower extremities, and lateral, dorsal, and medial aspects of foot.   Imaging: No results found.  Assessment & Plan: 50yo M presenting to clinic to follow up on chronic right sided lower back pain with occasional right leg radiculopathy. Examination today with tenderness over right SI Joint, and lower lumbar area. No radiculopathy today. - Given failure of conservative care, will order Xrays, MRI to better evaluate for underlying cause, and if he would be possible candidate for ESI.  - Patient expresses understanding. He will follow up pending imaging results.      Procedures: No procedures performed        PMFS History: Patient Active Problem List   Diagnosis Date Noted  . Morbid obesity with body mass index (BMI) of 40.0 to 44.9 in adult [facility]) 09/21/2019  . CHF (congestive heart failure), NYHA class I, acute, diastolic ([place]) 16/12/930  . Central sleep apnea comorbid with prescribed opioid use 09/21/2019  . History of uncontrolled hypertension 09/21/2019  . Complex sleep apnea syndrome 08/18/2019  . Claustrophobia 08/18/2019  . Anxiety associated with depression 08/18/2019  . Sleep disturbance 10/01/2018  . Low HDL (under 40) 05/11/2017  . Moderate major depression ([place]) 05/11/2017  . Hypokalemia 04/04/2017  . Morbid obesity ([place]) 04/04/2017  . Chest wall pain 04/04/2017  . Asthma 02/13/2016  .  Non compliance with medical treatment 06/13/2015  . OSA (obstructive sleep apnea) 04/06/2013  . Abnormal EKG 11/08/2012  . Essential hypertension   . HTN (hypertension), malignant 04/29/2012  . Migraine headache with aura 04/29/2012   Past Medical History:  Diagnosis Date  . Anxiety   . Depression    Questionable per records. Not on any medication.   Marland Kitchen History of kidney stones   . Hx of echocardiogram    a. Echo 12/13/12: Mild LVH, EF 80-88%, grade 1 diastolic dysfunction,  mild LAE, mild RVE, mild RAE  . Hypertension   . Insomnia   . Migraine   . Noncompliance   . OSA (obstructive sleep apnea)    a. Sleep Study 02/2013:  mod OSA, AHI 33 per hour, O2 sat nadir 85%    Family History  Problem Relation Age of Onset  . Cancer Father        prostate, colon- age was in his 79's  . Heart disease Father 82       MI   . Hypertension Father   . Colon cancer Father   . Diabetes Mother   . Hypertension Mother   . Hypertension Brother   . Cancer Maternal Grandfather   . Cancer Paternal Grandfather   . Esophageal cancer Neg Hx   . Stomach cancer Neg Hx   . Rectal cancer Neg Hx     Past Surgical History:  Procedure Laterality Date  . Admission  04/2012   Malignant HTN, HA.  CT head negative, cardiology consult.    . CYSTOSCOPY WITH RETROGRADE PYELOGRAM, URETEROSCOPY AND STENT PLACEMENT Left 01/24/2019   Procedure: CYSTOSCOPY WITH RETROGRADE PYELOGRAM, URETEROSCOPY AND STENT PLACEMENT;  Surgeon: Cleon Gustin, MD;  Location: [facility];  Service: Urology;  Laterality: Left;  . HOLMIUM LASER APPLICATION Left 01/07/3158   Procedure: HOLMIUM LASER APPLICATION;  Surgeon: Cleon Gustin, MD;  Location: [facility];  Service: Urology;  Laterality: Left;  Marland Kitchen MANDIBLE SURGERY  1998   metal plate   Social History   Occupational History  . Occupation: Unemployed//disabled  Tobacco Use  . Smoking status: Never Smoker  . Smokeless tobacco: Never Used  Substance and Sexual Activity  . Alcohol use: No    Alcohol/week: 0.0 standard drinks  . Drug use: No  . Sexual activity: Yes    Comment: per pt's blue health form - 1 sex partner in last 12 months

## 2021-03-14 ENCOUNTER — Ambulatory Visit: Payer: Medicare Other | Admitting: Family Medicine

## 2021-03-15 ENCOUNTER — Other Ambulatory Visit: Payer: Self-pay | Admitting: Family Medicine

## 2021-03-20 ENCOUNTER — Ambulatory Visit (INDEPENDENT_AMBULATORY_CARE_PROVIDER_SITE_OTHER): Payer: Medicare Other | Admitting: Registered Nurse

## 2021-03-20 ENCOUNTER — Other Ambulatory Visit: Payer: Self-pay

## 2021-03-20 ENCOUNTER — Other Ambulatory Visit (HOSPITAL_COMMUNITY)
Admission: RE | Admit: 2021-03-20 | Discharge: 2021-03-20 | Disposition: A | Discharge status: Home / Self Care | Payer: Medicare Other | Source: Ambulatory Visit | Attending: Registered Nurse | Admitting: Registered Nurse

## 2021-03-20 ENCOUNTER — Encounter: Payer: Self-pay | Admitting: Registered Nurse

## 2021-03-20 VITALS — BP 106/73 | HR 79 | Temp 97.7°F | Resp 16 | Ht 70.0 in | Wt 297.0 lb

## 2021-03-20 DIAGNOSIS — R300 Dysuria: Secondary | ICD-10-CM | POA: Insufficient documentation

## 2021-03-20 DIAGNOSIS — B009 Herpesviral infection, unspecified: Secondary | ICD-10-CM | POA: Diagnosis not present

## 2021-03-20 DIAGNOSIS — Z113 Encounter for screening for infections with a predominantly sexual mode of transmission: Secondary | ICD-10-CM | POA: Diagnosis not present

## 2021-03-20 LAB — POCT URINALYSIS DIP (MANUAL ENTRY)
Bilirubin, UA: NEGATIVE
Glucose, UA: NEGATIVE mg/dL
Ketones, POC UA: NEGATIVE mg/dL
Leukocytes, UA: NEGATIVE
Nitrite, UA: NEGATIVE
Protein Ur, POC: NEGATIVE mg/dL
Spec Grav, UA: 1.025 (ref 1.010–1.025)
Urobilinogen, UA: 1 E.U./dL
pH, UA: 5.5 (ref 5.0–8.0)

## 2021-03-20 MED ORDER — VALACYCLOVIR HCL 1 G PO TABS: 1000.0000 mg | ORAL_TABLET | Freq: Two times a day (BID) | ORAL | 6 refills | Status: DC

## 2021-03-20 NOTE — Progress Notes (Signed)
Established Patient Office Visit  Subjective:  Patient ID: Michael [name], male    DOB: [birth year]  Age: 50 y.o. MRN: [id number]  CC:  Chief Complaint  Patient presents with  . Dysuria     Pt states it burns when he urinates, going on for 2 days. Pt states that his ex may have had herpes and he would like to be checked for std.  . paperwork    Pt has a form that needs to be filled.    HPI Michael [name] presents for dysuria  Ongoing for past few days. Mild, at end of voiding. No discharge. No pain in penis or testicles. No bowel abnormalities. Denies flank pain and constitutional symptoms. No known exposure to STI, last test within last year, negative for GC, CT, and Trich. Monogamous with fiancee.  He saw Dr. Linna Darner last July for screening and had HSV ab drawn, returned positive for 1 and 2. Pt reports last partner let him know towards end of relationship that she had tested positive. Pt now has small area of bumps, mild tenderness at base of glans. Not spreading, not painful. No involvement of mucus membranes. Denies drainage. Interested in taking steps to address.  Past Medical History:  Diagnosis Date  . Anxiety   . Depression    Questionable per records. Not on any medication.   Marland Kitchen History of kidney stones   . Hx of echocardiogram    a. Echo 12/13/12: Mild LVH, EF 47-42%, grade 1 diastolic dysfunction, mild LAE, mild RVE, mild RAE  . Hypertension   . Insomnia   . Migraine   . Noncompliance   . OSA (obstructive sleep apnea)    a. Sleep Study 02/2013:  mod OSA, AHI 33 per hour, O2 sat nadir 85%    Past Surgical History:  Procedure Laterality Date  . Admission  04/2012   Malignant HTN, HA.  CT head negative, cardiology consult.    . CYSTOSCOPY WITH RETROGRADE PYELOGRAM, URETEROSCOPY AND STENT PLACEMENT Left 01/24/2019   Procedure: CYSTOSCOPY WITH RETROGRADE PYELOGRAM, URETEROSCOPY AND STENT PLACEMENT;  Surgeon: Cleon Gustin, MD;  Location: [facility];   Service: Urology;  Laterality: Left;  . HOLMIUM LASER APPLICATION Left 5/95/6387   Procedure: HOLMIUM LASER APPLICATION;  Surgeon: Cleon Gustin, MD;  Location: [facility];  Service: Urology;  Laterality: Left;  Marland Kitchen MANDIBLE SURGERY  1998   metal plate    Family History  Problem Relation Age of Onset  . Cancer Father        prostate, colon- age was in his 75's  . Heart disease Father 30       MI   . Hypertension Father   . Colon cancer Father   . Diabetes Mother   . Hypertension Mother   . Hypertension Brother   . Cancer Maternal Grandfather   . Cancer Paternal Grandfather   . Esophageal cancer Neg Hx   . Stomach cancer Neg Hx   . Rectal cancer Neg Hx     Social History   Socioeconomic History  . Marital status: Divorced    Spouse name: Not on file  . Number of children: 4  . Years of education: Not on file  . Highest education level: Not on file  Occupational History  . Occupation: Unemployed//disabled  Tobacco Use  . Smoking status: Never Smoker  . Smokeless tobacco: Never Used  Substance and Sexual Activity  . Alcohol use: No    Alcohol/week: 0.0 standard drinks  .  Drug use: No  . Sexual activity: Yes    Comment: per pt's blue health form - 1 sex partner in last 12 months  Other Topics Concern  . Not on file  Social History Narrative   Marital status: single; living with fiance      Children: 4 children, no grandchildren.      Employment:  Disability for learning disabilities since 2012.   Right-handed   Caffeine: occasional         Social Determinants of Radio broadcast assistant Strain: Not on file  Food Insecurity: Not on file  Transportation Needs: Not on file  Physical Activity: Not on file  Stress: Not on file  Social Connections: Not on file  Intimate Partner Violence: Not on file    Outpatient Medications Prior to Visit  Medication Sig Dispense Refill  . acetaminophen (TYLENOL) 325 MG tablet Take 650 mg by mouth every  6 (six) hours as needed for mild pain or headache.    Marland Kitchen amLODipine (NORVASC) 10 MG tablet Take 1 tablet (10 mg total) by mouth daily. 90 tablet 1  . buPROPion (WELLBUTRIN XL) 300 MG 24 hr tablet Take 1 tablet (300 mg total) by mouth daily. 90 tablet 1  . carvedilol (COREG) 6.25 MG tablet Take 1 tablet by mouth twice daily 60 tablet 2  . celecoxib (CELEBREX) 200 MG capsule Take 1 capsule (200 mg total) by mouth 2 (two) times daily as needed. 60 capsule 6  . chlorthalidone (HYGROTON) 50 MG tablet Take 1 tablet by mouth every day 90 tablet 1  . hydrocortisone (ANUSOL-HC) 25 MG suppository Place 1 suppository (25 mg total) rectally 2 (two) times daily. 12 suppository 0  . Lidocaine, Anorectal, 5 % GEL Apply pea-sized amount to the perianal skin up to 3 times per day as needed. 30 g 0  . lisinopril (ZESTRIL) 40 MG tablet Take 1 tablet by mouth every day 90 tablet 1  . potassium chloride (KLOR-CON) 10 MEQ tablet Take 1 tablet by mouth every day 90 tablet 3  . propranolol ER (INDERAL LA) 80 MG 24 hr capsule Take 1 capsule (80 mg total) by mouth daily. 90 capsule 0  . QUEtiapine (SEROQUEL) 50 MG tablet Take 1 tablet (50 mg total) by mouth at bedtime. 90 tablet 0  . tiZANidine (ZANAFLEX) 2 MG tablet Take 1-2 tablets (2-4 mg total) by mouth every 6 (six) hours as needed for muscle spasms. 60 tablet 1   Facility-Administered Medications Prior to Visit  Medication Dose Route Frequency Provider Last Rate Last Admin  . 0.9 %  sodium chloride infusion  500 mL Intravenous Continuous Ladene Artist, MD        Allergies  Allergen Reactions  . Statins Other (See Comments)    unknown    ROS Review of Systems  Constitutional: Negative.   HENT: Negative.   Eyes: Negative.   Respiratory: Negative.   Cardiovascular: Negative.   Gastrointestinal: Negative.   Genitourinary: Positive for dysuria and genital sores.  Musculoskeletal: Negative.   Skin: Negative.   Neurological: Negative.    Psychiatric/Behavioral: Negative.       Objective:    Physical Exam Constitutional:      General: He is not in acute distress.    Appearance: Normal appearance. He is normal weight. He is not ill-appearing, toxic-appearing or diaphoretic.  Cardiovascular:     Rate and Rhythm: Normal rate and regular rhythm.     Heart sounds: Normal heart sounds. No murmur heard. No  friction rub. No gallop.   Pulmonary:     Effort: Pulmonary effort is normal. No respiratory distress.     Breath sounds: Normal breath sounds. No stridor. No wheezing, rhonchi or rales.  Chest:     Chest wall: No tenderness.  Genitourinary:    Comments: Declined by patient Neurological:     General: No focal deficit present.     Mental Status: He is alert and oriented to person, place, and time. Mental status is at baseline.  Psychiatric:        Mood and Affect: Mood normal.        Behavior: Behavior normal.        Thought Content: Thought content normal.        Judgment: Judgment normal.     BP 106/73   Pulse 79   Temp 97.7 F (36.5 C) (Temporal)   Resp 16   Ht 5\' 10"  (1.778 m)   Wt 297 lb (134.7 kg)   SpO2 98%   BMI 42.62 kg/m  Wt Readings from Last 3 Encounters:  03/20/21 297 lb (134.7 kg)  02/27/21 (!) 302 lb (137 kg)  01/03/21 290 lb (131.5 kg)     Health Maintenance Due  Topic Date Due  . COVID-19 Vaccine (3 - Booster for Pfizer series) 10/09/2020    There are no preventive care reminders to display for this patient.  Lab Results  Component Value Date   TSH 1.100 04/11/2020   Lab Results  Component Value Date   WBC 5.0 11/16/2020   HGB 14.2 11/16/2020   HCT 44.2 11/16/2020   MCV 89.3 11/16/2020   PLT 207 11/16/2020   Lab Results  Component Value Date   NA 140 02/27/2021   K 3.5 02/27/2021   CO2 25 02/27/2021   GLUCOSE 94 02/27/2021   BUN 25 (H) 02/27/2021   CREATININE 1.28 (H) 02/27/2021   BILITOT 0.7 11/16/2020   ALKPHOS 55 11/16/2020   AST 17 11/16/2020   ALT 15  11/16/2020   PROT 7.6 11/16/2020   ALBUMIN 4.1 11/16/2020   CALCIUM 9.4 02/27/2021   ANIONGAP 10 11/16/2020   GFR 112.44 01/28/2013   Lab Results  Component Value Date   CHOL 159 03/22/2020   Lab Results  Component Value Date   HDL 44 03/22/2020   Lab Results  Component Value Date   LDLCALC 100 (H) 03/22/2020   Lab Results  Component Value Date   TRIG 79 03/22/2020   Lab Results  Component Value Date   CHOLHDL 3.6 03/22/2020   Lab Results  Component Value Date   HGBA1C 6.0 (H) 02/27/2021      Assessment & Plan:   Problem List Items Addressed This Visit   None   Visit Diagnoses    Dysuria    -  Primary   Relevant Orders   POCT urinalysis dipstick (Completed)   Urine cytology ancillary only   PSA   Routine screening for STI (sexually transmitted infection)       Relevant Orders   HIV antibody (with reflex)   RPR   HSV (herpes simplex virus) infection       Relevant Medications   valACYclovir (VALTREX) 1000 MG tablet      Meds ordered this encounter  Medications  . valACYclovir (VALTREX) 1000 MG tablet    Sig: Take 1 tablet (1,000 mg total) by mouth 2 (two) times daily.    Dispense:  14 tablet    Refill:  6    Order  Specific Question:   Supervising Provider    Answer:   Carlota Raspberry, JEFFREY R [[id number]]    Follow-up: No follow-ups on file.   PLAN  Will send out GC/CT/Trich  Poct ua showed small rbc in urine, microscopic hematuria. Discussed with patient that this may be benign and transient. No nitrites, WBC, protein, glucose or ketones reassuring.   Will send lab orders for hiv rpr and psa to rule out further concerns  Valtrex 1000mg  po bid for one week for rash  Return prn or as schedule with pcp  Patient encouraged to call clinic with any questions, comments, or concerns.   Maximiano Coss, NP

## 2021-03-20 NOTE — Patient Instructions (Signed)
° ° ° °  If you have lab work done today you will be contacted with your lab results within the next 2 weeks.  If you have not heard from us then please contact us. The fastest way to get your results is to register for My Chart. ° ° °IF you received an x-ray today, you will receive an invoice from [facility]. Please contact [facility] at [phone] with questions or concerns regarding your invoice.  ° °IF you received labwork today, you will receive an invoice from [facility]. Please contact [facility] at [phone] with questions or concerns regarding your invoice.  ° °Our billing staff will not be able to assist you with questions regarding bills from these companies. ° °You will be contacted with the lab results as soon as they are available. The fastest way to get your results is to activate your My Chart account. Instructions are located on the last page of this paperwork. If you have not heard from us regarding the results in 2 weeks, please contact this office. °  ° ° ° °

## 2021-03-21 LAB — URINE CYTOLOGY ANCILLARY ONLY
Chlamydia: NEGATIVE
Comment: NEGATIVE
Comment: NEGATIVE
Comment: NORMAL
Neisseria Gonorrhea: NEGATIVE
Trichomonas: NEGATIVE

## 2021-04-03 ENCOUNTER — Ambulatory Visit
Admission: RE | Admit: 2021-04-03 | Discharge: 2021-04-03 | Disposition: A | Discharge status: Home / Self Care | Payer: Medicare Other | Source: Ambulatory Visit | Attending: Family Medicine | Admitting: Family Medicine

## 2021-04-03 DIAGNOSIS — M545 Low back pain, unspecified: Secondary | ICD-10-CM | POA: Diagnosis not present

## 2021-04-03 DIAGNOSIS — G8929 Other chronic pain: Secondary | ICD-10-CM

## 2021-04-03 DIAGNOSIS — M48061 Spinal stenosis, lumbar region without neurogenic claudication: Secondary | ICD-10-CM | POA: Diagnosis not present

## 2021-04-03 IMAGING — MR MR LUMBAR SPINE W/O CM
4 of 5 series · 18 of 48 positions shown · non-contrast
Comparison: None.

CLINICAL DATA: Low back pain.

EXAM:
MRI LUMBAR SPINE WITHOUT CONTRAST
TECHNIQUE: Multiplanar, multisequence MR imaging of the lumbar spine was
performed. No intravenous contrast was administered.

[Series 6: T2 · sagittal · 4.0mm · 0.78mm/px · 6 of 15 slices shown (1 of 2)]
[im 1/15]
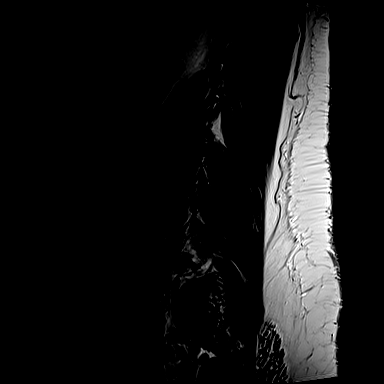
[im 3/15]
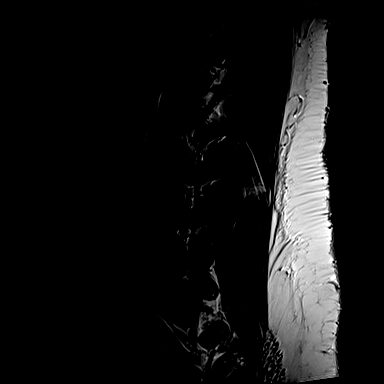
[im 6/15]
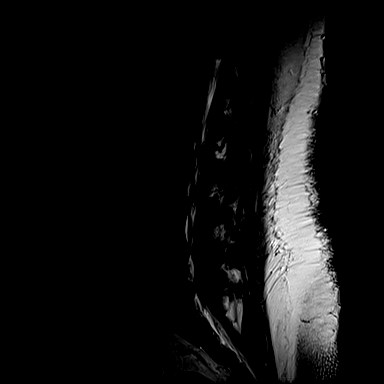
[im 9/15]
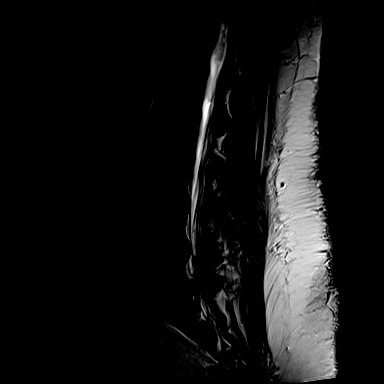
[im 12/15]
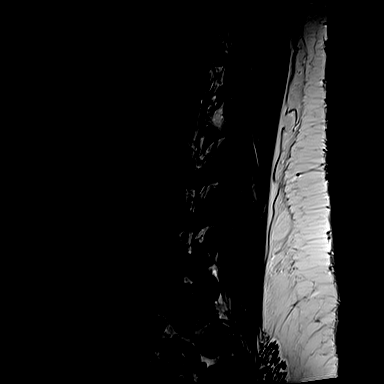
[im 15/15]
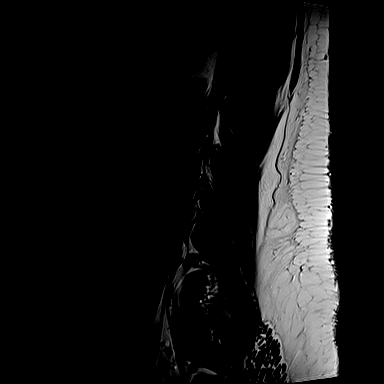

[Series 7: T1 · sagittal · 4.0mm · 0.73mm/px · 3 of 15 slices shown (1 of 2)]
[im 3/15]
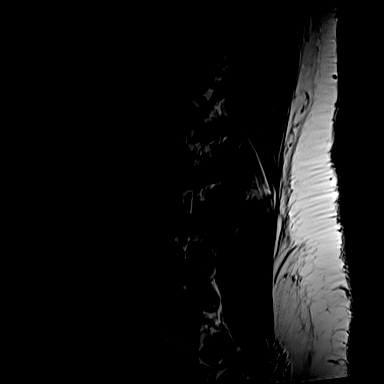
[im 9/15]
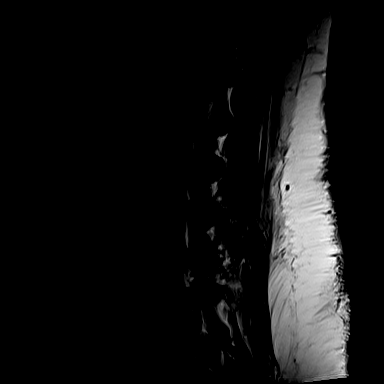
[im 15/15]
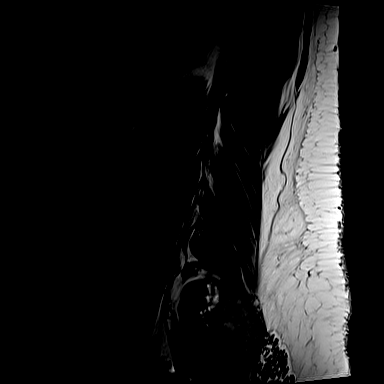

[Series 11: T1 · axial · 4.0mm · 0.28mm/px · z∈[-46,+116]mm · 3 of 41 slices shown (2 of 2)]
[im 6/41]
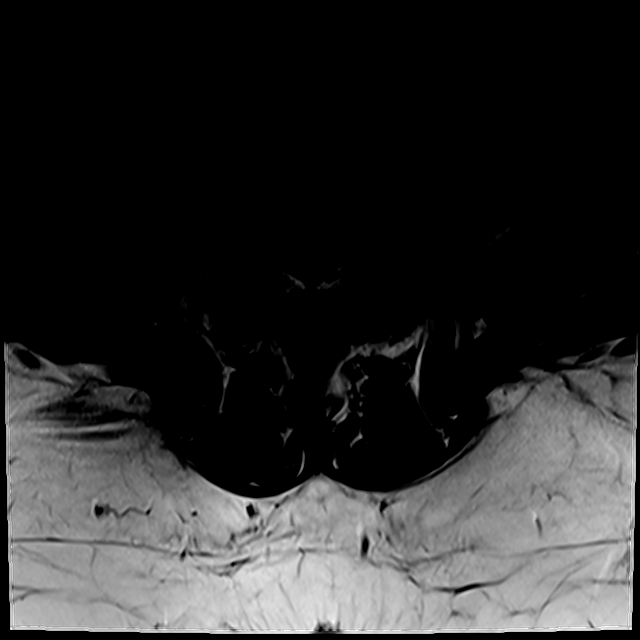
[im 21/41]
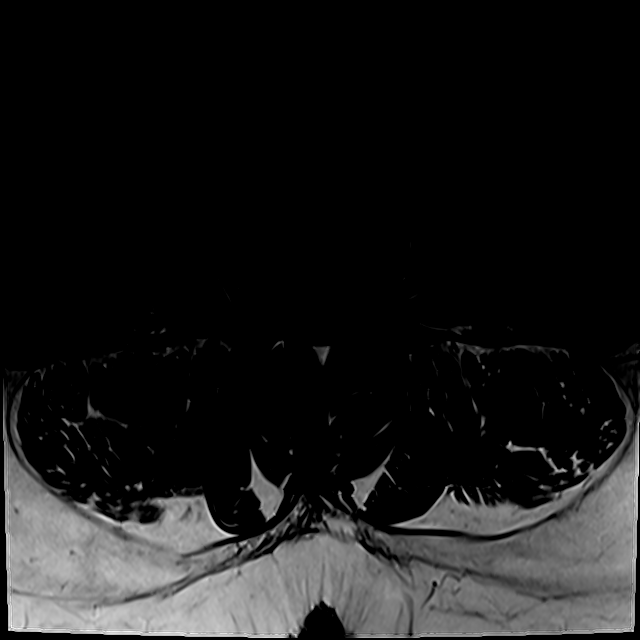
[im 35/41]
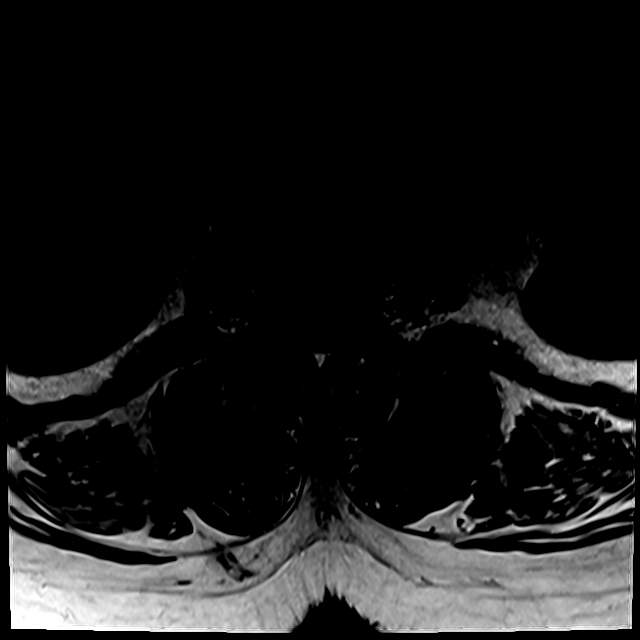

[Series 14: T2 · axial · 4.0mm · 0.28mm/px · z∈[-71,+116]mm · 6 of 41 slices shown (2 of 2)]
[im 1/41]
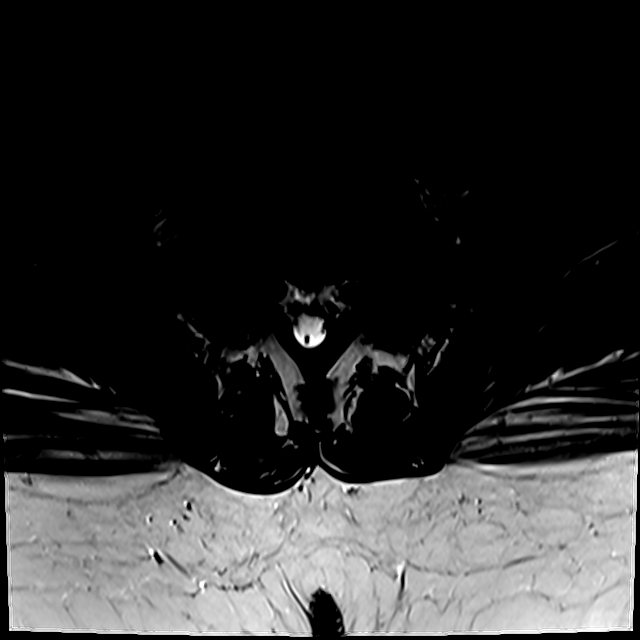
[im 6/41]
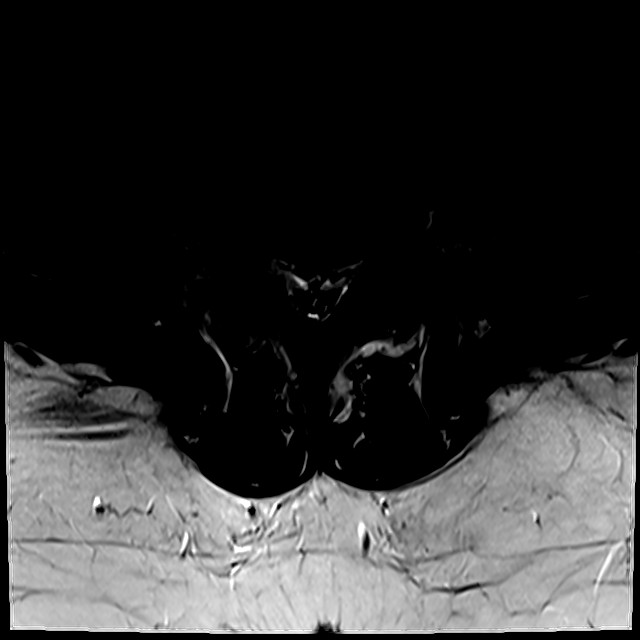
[im 12/41]
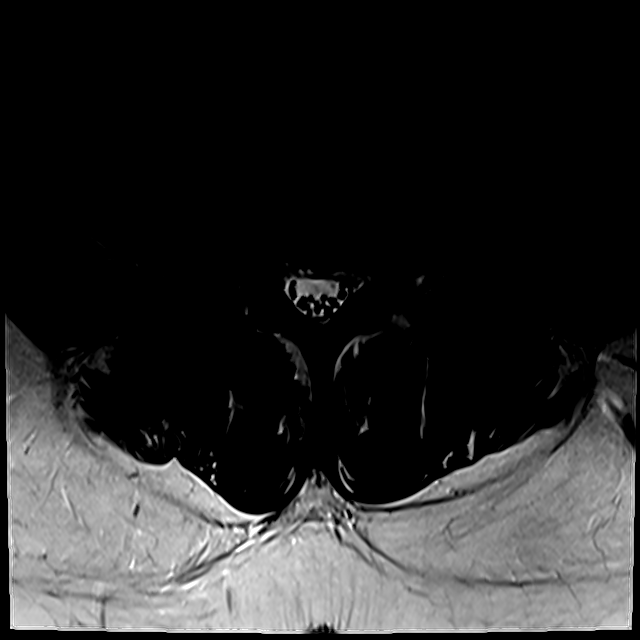
[im 18/41]
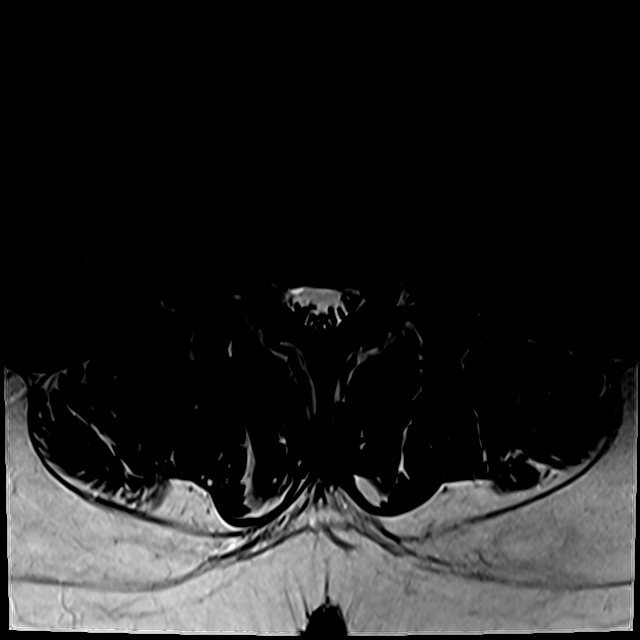
[im 21/41]
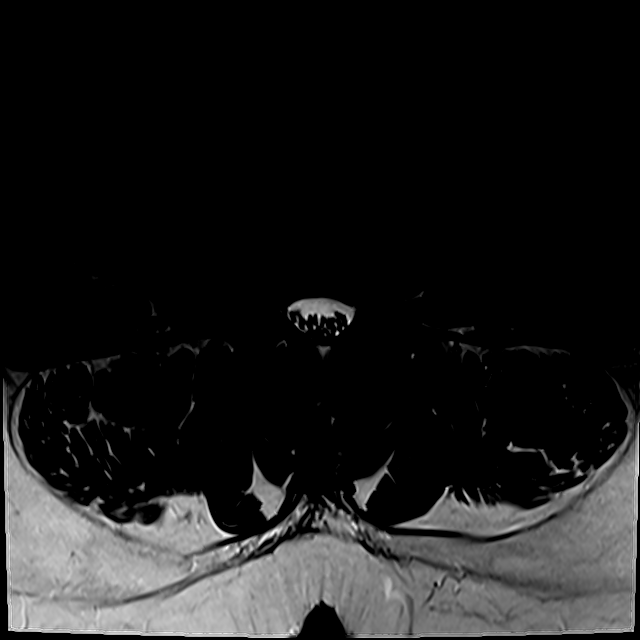
[im 35/41]
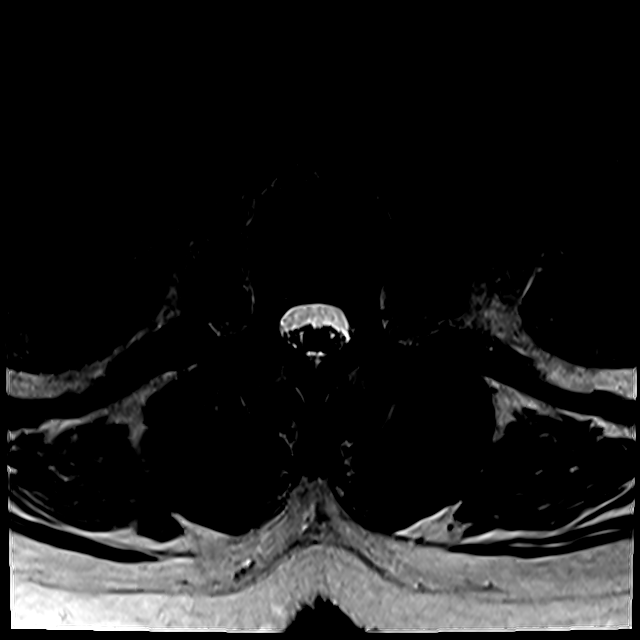

[18 of 48 positions shown; findings below may reference images not displayed]

FINDINGS: Segmentation: Standard segmentation is assumed. The inferior-most
fully formed intervertebral disc is labeled L5-S1.

Alignment:  No substantial sagittal subluxation.

Vertebrae: Degenerative/discogenic endplate signal changes about the
L5-S1 disc.

Conus medullaris and cauda equina: Conus extends to the L2 level.
Conus appears normal.

Paraspinal and other soft tissues: Bilateral renal cysts, partially
imaged.

Disc levels:

T12-L1: No significant disc protrusion, foraminal stenosis, or canal
stenosis.

L1-L2: No significant disc protrusion, foraminal stenosis, or canal
stenosis.

L2-L3: Mild disc bulging and facet hypertrophy without significant
canal or foraminal stenosis.

L3-L4: Mild disc bulging and facet hypertrophy without significant
canal or foraminal stenosis.

L4-L5: Broad-based disc bulge with superimposed left foraminal disc
protrusion. Bilateral facet hypertrophy and ligamentum flavum
thickening. There is moderate canal and bilateral lateral recess
stenosis at the inferior L4-L5 level and along the posterior aspect
of the L5 vertebral body secondary to degenerative changes and
prominent ventral epidural fat. Mild left foraminal stenosis

L5-S1: Prominent ventral epidural fat at L5 which moderately narrows
the canal. Additionally, there is broad-based disc bulge with
superimposed left subarticular/foraminal disc protrusion which
results in moderate to severe left subarticular recess stenosis with
suspected impingement of the descending left S1 nerve root. Mild
right subarticular recess stenosis and mild canal stenosis
IMPRESSION: 1. At L5-S1 there is broad-based disc bulge with superimposed left
subarticular/foraminal disc protrusion which results in moderate to
severe left subarticular recess stenosis with suspected impingement
of the descending left S1 nerve root. Moderate left and mild right
foraminal stenosis and mild to moderate canal stenosis at this
level.
2. Moderate canal and bilateral lateral recess stenosis at the
inferior L4-L5 level and at the L5 vertebral body levels secondary
to prominent ventral epidural fat and degenerative change. Mild left
foraminal stenosis at L4-L5

## 2021-04-04 ENCOUNTER — Ambulatory Visit (INDEPENDENT_AMBULATORY_CARE_PROVIDER_SITE_OTHER): Payer: Medicare Other | Admitting: Podiatry

## 2021-04-04 ENCOUNTER — Other Ambulatory Visit: Payer: Self-pay

## 2021-04-04 ENCOUNTER — Encounter: Payer: Self-pay | Admitting: Podiatry

## 2021-04-04 ENCOUNTER — Telehealth: Payer: Self-pay | Admitting: Family Medicine

## 2021-04-04 DIAGNOSIS — M5126 Other intervertebral disc displacement, lumbar region: Secondary | ICD-10-CM

## 2021-04-04 DIAGNOSIS — M722 Plantar fascial fibromatosis: Secondary | ICD-10-CM

## 2021-04-04 NOTE — Telephone Encounter (Signed)
MRI shows a large disc protrusion at L5-S1 resulting in moderate to severe narrowing of the nerve opening on the left and probable impingement of the left S1 nerve root.  There is also a disc bulge at L4-5 causing moderate narrowing of the spinal canal.    Would suggest referral to Dr. Ernestina Patches for an epidural steroid injection.  If that didn't help, then possibly surgical consult.

## 2021-04-05 NOTE — Telephone Encounter (Signed)
I sent a new message to the patient through [place] regarding scheduling an appointment with Dr. Lorin Mercy. Awaiting his response.

## 2021-04-05 NOTE — Addendum Note (Signed)
Addended by: Hortencia Pilar on: 04/05/2021 08:46 AM   Modules accepted: Orders

## 2021-04-05 NOTE — Progress Notes (Signed)
Subjective:   Patient ID: Michael [name], male   DOB: 50 y.o.   MRN: [id number]   HPI Patient states he is improving and states the boot seems to be helping to reduce his heel pain   ROS      Objective:  Physical Exam  Neurovascular status intact with patient found to have reduced discomfort in the plantar heel with inflammation fluid that is present but has gotten quite a bit better from previous visit     Assessment:  Acute plantar fasciitis left improving     Plan:  H&P reviewed condition and at this point I have recommended the continuation of boot stretching exercises shoe gear modification anti-inflammatories.  Patient will be seen back to recheck and still could require more aggressive treatment including surgery or shockwave but at this point unless symptoms were to get worse organ to give it a chance with immobilization

## 2021-04-08 ENCOUNTER — Ambulatory Visit (INDEPENDENT_AMBULATORY_CARE_PROVIDER_SITE_OTHER): Payer: Medicare Other | Admitting: Family Medicine

## 2021-04-08 ENCOUNTER — Other Ambulatory Visit: Payer: Self-pay

## 2021-04-08 DIAGNOSIS — M5126 Other intervertebral disc displacement, lumbar region: Secondary | ICD-10-CM

## 2021-04-08 MED ORDER — GABAPENTIN 100 MG PO CAPS: ORAL_CAPSULE | ORAL | 3 refills | Status: DC

## 2021-04-08 NOTE — Progress Notes (Signed)
Office Visit Note   Patient: Michael [name]           Date of Birth: January 21, 1971           MRN: [id number] Visit Date: 04/08/2021 Requested by: [name], MD [address] [place],  [place] [address] PCP: [name], MD  Subjective: Chief Complaint  Patient presents with  . Lower Back - Pain, Follow-up    Post MRI Lsp    HPI: He is here for follow-up chronic low back pain with left-sided leg pain.  He is here to discuss MRI results.  MRI showed L4-5 and L5-S1 disc bulges, with left S1 nerve impingement at L5-S1.  He is reluctant to consider surgery.  He has tried physical therapy which did not help, and chiropractic made his pain worse.              ROS:   All other systems were reviewed and are negative.  Objective: Vital Signs: There were no vitals taken for this visit.  Physical Exam:  General:  Alert and oriented, in no acute distress. Pulm:  Breathing unlabored. Psy:  Normal mood, congruent affect.  No exam done today.  Imaging: Images were reviewed on computer.    Assessment & Plan: 1.  Chronic low back pain with L4-5 and L5-S1 protrusions, suspect that the L5-S1 level is the majority of his pain. -Elected to proceed with epidural injection.  We will try gabapentin.  He will follow-up as needed based on his response to the injection.  Surgical consult if all else fails.     Procedures: No procedures performed        PMFS History: Patient Active Problem List   Diagnosis Date Noted  . Morbid obesity with body mass index (BMI) of 40.0 to 44.9 in adult [facility]) 09/21/2019  . CHF (congestive heart failure), NYHA class I, acute, diastolic ([place]) 94/49/6759  . Central sleep apnea comorbid with prescribed opioid use 09/21/2019  . History of uncontrolled hypertension 09/21/2019  . Complex sleep apnea syndrome 08/18/2019  . Claustrophobia 08/18/2019  . Anxiety associated with depression 08/18/2019  . Sleep disturbance 10/01/2018  . Low HDL (under 40)  05/11/2017  . Moderate major depression ([place]) 05/11/2017  . Hypokalemia 04/04/2017  . Morbid obesity ([place]) 04/04/2017  . Chest wall pain 04/04/2017  . Asthma 02/13/2016  . Non compliance with medical treatment 06/13/2015  . OSA (obstructive sleep apnea) 04/06/2013  . Abnormal EKG 11/08/2012  . Essential hypertension   . HTN (hypertension), malignant 04/29/2012  . Migraine headache with aura 04/29/2012   Past Medical History:  Diagnosis Date  . Anxiety   . Depression    Questionable per records. Not on any medication.   Marland Kitchen History of kidney stones   . Hx of echocardiogram    a. Echo 12/13/12: Mild LVH, EF 16-38%, grade 1 diastolic dysfunction, mild LAE, mild RVE, mild RAE  . Hypertension   . Insomnia   . Migraine   . Noncompliance   . OSA (obstructive sleep apnea)    a. Sleep Study 02/2013:  mod OSA, AHI 33 per hour, O2 sat nadir 85%    Family History  Problem Relation Age of Onset  . Cancer Father        prostate, colon- age was in his 2's  . Heart disease Father 43       MI   . Hypertension Father   . Colon cancer Father   . Diabetes Mother   .  Hypertension Mother   . Hypertension Brother   . Cancer Maternal Grandfather   . Cancer Paternal Grandfather   . Esophageal cancer Neg Hx   . Stomach cancer Neg Hx   . Rectal cancer Neg Hx     Past Surgical History:  Procedure Laterality Date  . Admission  04/2012   Malignant HTN, HA.  CT head negative, cardiology consult.    . CYSTOSCOPY WITH RETROGRADE PYELOGRAM, URETEROSCOPY AND STENT PLACEMENT Left 01/24/2019   Procedure: CYSTOSCOPY WITH RETROGRADE PYELOGRAM, URETEROSCOPY AND STENT PLACEMENT;  Surgeon: Cleon Gustin, MD;  Location: [facility];  Service: Urology;  Laterality: Left;  . HOLMIUM LASER APPLICATION Left 8/87/5797   Procedure: HOLMIUM LASER APPLICATION;  Surgeon: Cleon Gustin, MD;  Location[facility];  Service: Urology;  Laterality: Left;  Marland Kitchen MANDIBLE SURGERY  1998    metal plate   Social History   Occupational History  . Occupation: Unemployed//disabled  Tobacco Use  . Smoking status: Never Smoker  . Smokeless tobacco: Never Used  Substance and Sexual Activity  . Alcohol use: No    Alcohol/week: 0.0 standard drinks  . Drug use: No  . Sexual activity: Yes    Comment: per pt's [employer] form - 1 sex partner in last 12 months

## 2021-04-10 ENCOUNTER — Ambulatory Visit: Payer: Self-pay | Admitting: Family Medicine

## 2021-04-14 ENCOUNTER — Other Ambulatory Visit: Payer: Self-pay | Admitting: Family Medicine

## 2021-04-14 NOTE — Telephone Encounter (Signed)
Routing to [employer]

## 2021-04-22 ENCOUNTER — Other Ambulatory Visit: Payer: Self-pay

## 2021-04-22 MED ORDER — CARVEDILOL 6.25 MG PO TABS: 6.2500 mg | ORAL_TABLET | Freq: Two times a day (BID) | ORAL | 2 refills | Status: DC

## 2021-04-24 ENCOUNTER — Telehealth: Payer: Medicare Other | Admitting: Family Medicine

## 2021-04-25 ENCOUNTER — Ambulatory Visit: Payer: Medicare Other | Admitting: Family Medicine

## 2021-04-30 ENCOUNTER — Encounter: Payer: Self-pay | Admitting: Physical Medicine and Rehabilitation

## 2021-04-30 ENCOUNTER — Ambulatory Visit (INDEPENDENT_AMBULATORY_CARE_PROVIDER_SITE_OTHER): Payer: Medicare Other | Admitting: Physical Medicine and Rehabilitation

## 2021-04-30 ENCOUNTER — Other Ambulatory Visit: Payer: Self-pay

## 2021-04-30 ENCOUNTER — Ambulatory Visit: Payer: Self-pay

## 2021-04-30 VITALS — BP 129/90 | HR 76

## 2021-04-30 DIAGNOSIS — M5416 Radiculopathy, lumbar region: Secondary | ICD-10-CM | POA: Diagnosis not present

## 2021-04-30 MED ORDER — METHYLPREDNISOLONE ACETATE 80 MG/ML IJ SUSP
80.0000 mg | Freq: Once | INTRAMUSCULAR | Status: AC
  Administered 2021-04-30: 80 mg

## 2021-04-30 NOTE — Progress Notes (Signed)
Michael [name] - 50 y.o. male MRN [id number]  Date of birth: 24-Jan-1971  Office Visit Note: Visit Date: 04/30/2021 PCP: [name], MD Referred by: [name], MD  Subjective: Chief Complaint  Patient presents with  . Lower Back - Pain  . Left Leg - Pain  . Right Leg - Pain   HPI:  Michael [name] is a 50 y.o. male who comes in today at the request of Dr. Eunice Blase for planned Left S1-2 Lumbar epidural steroid injection with fluoroscopic guidance.  The patient has failed conservative care including home exercise, medications, time and activity modification.  This injection will be diagnostic and hopefully therapeutic.  Please see requesting physician notes for further details and justification. MRI reviewed with images and spine model.  MRI reviewed in the note below.   ROS Otherwise per HPI.  Assessment & Plan: Visit Diagnoses:    ICD-10-CM   1. Lumbar radiculopathy  M54.16 XR C-ARM NO REPORT    Epidural Steroid injection    methylPREDNISolone acetate (DEPO-MEDROL) injection 80 mg    Plan: No additional findings.   Meds & Orders:  Meds ordered this encounter  Medications  . methylPREDNISolone acetate (DEPO-MEDROL) injection 80 mg    Orders Placed This Encounter  Procedures  . XR C-ARM NO REPORT  . Epidural Steroid injection    Follow-up: Return if symptoms worsen or fail to improve, for Consider L4-5 interlaminar epidural..   Procedures: No procedures performed  S1 Lumbosacral Transforaminal Epidural Steroid Injection - Sub-Pedicular Approach with Fluoroscopic Guidance   Patient: Michael [name]      Date of Birth: [birth year] MRN: [id number] PCP: [name], MD      Visit Date: 04/30/2021   Universal Protocol:    Date/Time: 05/03/221:32 PM  Consent Given By: the patient  Position:  PRONE  Additional Comments: Vital signs were monitored before and after the procedure. Patient was prepped and draped in the usual sterile fashion. The correct  patient, procedure, and site was verified.   Injection Procedure Details:  Procedure Site One Meds Administered:  Meds ordered this encounter  Medications  . methylPREDNISolone acetate (DEPO-MEDROL) injection 80 mg    Laterality: Left  Location/Site:  S1 Foramen   Needle size: 22 ga.  Needle type: Spinal  Needle Placement: Transforaminal  Findings:   -Comments: Excellent flow of contrast along the nerve, nerve root and into the epidural space.  Epidurogram: Contrast epidurogram showed no nerve root cut off or restricted flow pattern.  Procedure Details: After squaring off the sacral end-plate to get a true AP view, the C-arm was positioned so that the best possible view of the S1 foramen was visualized. The soft tissues overlying this structure were infiltrated with 2-3 ml. of 1% Lidocaine without Epinephrine.    The spinal needle was inserted toward the target using a "trajectory" view along the fluoroscope beam.  Under AP and lateral visualization, the needle was advanced so it did not puncture dura. Biplanar projections were used to confirm position. Aspiration was confirmed to be negative for CSF and/or blood. A 1-2 ml. volume of Isovue-250 was injected and flow of contrast was noted at each level. Radiographs were obtained for documentation purposes.   After attaining the desired flow of contrast documented above, a 0.5 to 1.0 ml test dose of 0.25% Marcaine was injected into each respective transforaminal space.  The patient was observed for 90 seconds post injection.  After no sensory deficits were reported, and normal lower extremity motor  function was noted,   the above injectate was administered so that equal amounts of the injectate were placed at each foramen (level) into the transforaminal epidural space.   Additional Comments:  The patient tolerated the procedure well Dressing: Band-Aid with 2 x 2 sterile gauze    Post-procedure details: Patient was observed  during the procedure. Post-procedure instructions were reviewed.  Patient left the clinic in stable condition.     Clinical History: MRI LUMBAR SPINE WITHOUT CONTRAST  TECHNIQUE: Multiplanar, multisequence MR imaging of the lumbar spine was performed. No intravenous contrast was administered.  COMPARISON:  None.  FINDINGS: Segmentation: Standard segmentation is assumed. The inferior-most fully formed intervertebral disc is labeled L5-S1.  Alignment:  No substantial sagittal subluxation.  Vertebrae: Degenerative/discogenic endplate signal changes about the L5-S1 disc.  Conus medullaris and cauda equina: Conus extends to the L2 level. Conus appears normal.  Paraspinal and other soft tissues: Bilateral renal cysts, partially imaged.  Disc levels:  T12-L1: No significant disc protrusion, foraminal stenosis, or canal stenosis.  L1-L2: No significant disc protrusion, foraminal stenosis, or canal stenosis.  L2-L3: Mild disc bulging and facet hypertrophy without significant canal or foraminal stenosis.  L3-L4: Mild disc bulging and facet hypertrophy without significant canal or foraminal stenosis.  L4-L5: Broad-based disc bulge with superimposed left foraminal disc protrusion. Bilateral facet hypertrophy and ligamentum flavum thickening. There is moderate canal and bilateral lateral recess stenosis at the inferior L4-L5 level and along the posterior aspect of the L5 vertebral body secondary to degenerative changes and prominent ventral epidural fat. Mild left foraminal stenosis  L5-S1: Prominent ventral epidural fat at L5 which moderately narrows the canal. Additionally, there is broad-based disc bulge with superimposed left subarticular/foraminal disc protrusion which results in moderate to severe left subarticular recess stenosis with suspected impingement of the descending left S1 nerve root. Mild right subarticular recess stenosis and mild canal  stenosis  IMPRESSION: 1. At L5-S1 there is broad-based disc bulge with superimposed left subarticular/foraminal disc protrusion which results in moderate to severe left subarticular recess stenosis with suspected impingement of the descending left S1 nerve root. Moderate left and mild right foraminal stenosis and mild to moderate canal stenosis at this level. 2. Moderate canal and bilateral lateral recess stenosis at the inferior L4-L5 level and at the L5 vertebral body levels secondary to prominent ventral epidural fat and degenerative change. Mild left foraminal stenosis at L4-L5   Electronically Signed   By: Margaretha Sheffield MD   On: 04/04/2021 08:32     Objective:  VS:  HT:    WT:   BMI:     BP:129/90  HR:76bpm  TEMP: ( )  RESP:  Physical Exam Vitals and nursing note reviewed.  Constitutional:      General: He is not in acute distress.    Appearance: Normal appearance. He is obese. He is not ill-appearing.  HENT:     Head: Normocephalic and atraumatic.     Right Ear: External ear normal.     Left Ear: External ear normal.     Nose: No congestion.  Eyes:     Extraocular Movements: Extraocular movements intact.  Cardiovascular:     Rate and Rhythm: Normal rate.     Pulses: Normal pulses.  Pulmonary:     Effort: Pulmonary effort is normal. No respiratory distress.  Abdominal:     General: There is no distension.     Palpations: Abdomen is soft.  Musculoskeletal:        General: No  tenderness or signs of injury.     Cervical back: Neck supple.     Right lower leg: No edema.     Left lower leg: No edema.     Comments: Patient has good distal strength without clonus.  Skin:    Findings: No erythema or rash.  Neurological:     General: No focal deficit present.     Mental Status: He is alert and oriented to person, place, and time.     Sensory: No sensory deficit.     Motor: No weakness or abnormal muscle tone.     Coordination: Coordination normal.   Psychiatric:        Mood and Affect: Mood normal.        Behavior: Behavior normal.      Imaging: No results found.

## 2021-04-30 NOTE — Procedures (Signed)
S1 Lumbosacral Transforaminal Epidural Steroid Injection - Sub-Pedicular Approach with Fluoroscopic Guidance   Patient: Michael [name]      Date of Birth: [birth year] MRN: [id number] PCP: [name], MD      Visit Date: 04/30/2021   Universal Protocol:    Date/Time: 05/03/221:32 PM  Consent Given By: the patient  Position:  PRONE  Additional Comments: Vital signs were monitored before and after the procedure. Patient was prepped and draped in the usual sterile fashion. The correct patient, procedure, and site was verified.   Injection Procedure Details:  Procedure Site One Meds Administered:  Meds ordered this encounter  Medications  . methylPREDNISolone acetate (DEPO-MEDROL) injection 80 mg    Laterality: Left  Location/Site:  S1 Foramen   Needle size: 22 ga.  Needle type: Spinal  Needle Placement: Transforaminal  Findings:   -Comments: Excellent flow of contrast along the nerve, nerve root and into the epidural space.  Epidurogram: Contrast epidurogram showed no nerve root cut off or restricted flow pattern.  Procedure Details: After squaring off the sacral end-plate to get a true AP view, the C-arm was positioned so that the best possible view of the S1 foramen was visualized. The soft tissues overlying this structure were infiltrated with 2-3 ml. of 1% Lidocaine without Epinephrine.    The spinal needle was inserted toward the target using a "trajectory" view along the fluoroscope beam.  Under AP and lateral visualization, the needle was advanced so it did not puncture dura. Biplanar projections were used to confirm position. Aspiration was confirmed to be negative for CSF and/or blood. A 1-2 ml. volume of Isovue-250 was injected and flow of contrast was noted at each level. Radiographs were obtained for documentation purposes.   After attaining the desired flow of contrast documented above, a 0.5 to 1.0 ml test dose of 0.25% Marcaine was injected into each  respective transforaminal space.  The patient was observed for 90 seconds post injection.  After no sensory deficits were reported, and normal lower extremity motor function was noted,   the above injectate was administered so that equal amounts of the injectate were placed at each foramen (level) into the transforaminal epidural space.   Additional Comments:  The patient tolerated the procedure well Dressing: Band-Aid with 2 x 2 sterile gauze    Post-procedure details: Patient was observed during the procedure. Post-procedure instructions were reviewed.  Patient left the clinic in stable condition.

## 2021-04-30 NOTE — Progress Notes (Signed)
Pt state lower back pain that travels down both leg but mostly on his left side. Pt state sitting and climbing stairs. Pt state he takes pain meds to help ease his pain.  Numeric Pain Rating Scale and Functional Assessment Average Pain 0   In the last MONTH (on 0-10 scale) has pain interfered with the following?  1. General activity like being  able to carry out your everyday physical activities such as walking, climbing stairs, carrying groceries, or moving a chair?  Rating(10)   +Driver, -BT, -Dye Allergies.

## 2021-04-30 NOTE — Patient Instructions (Signed)

## 2021-05-02 ENCOUNTER — Other Ambulatory Visit: Payer: Self-pay

## 2021-05-02 ENCOUNTER — Ambulatory Visit (HOSPITAL_COMMUNITY)
Admission: EM | Admit: 2021-05-02 | Discharge: 2021-05-02 | Disposition: A | Discharge status: Home / Self Care | Payer: Medicare Other | Attending: Internal Medicine | Admitting: Internal Medicine

## 2021-05-02 ENCOUNTER — Telehealth (INDEPENDENT_AMBULATORY_CARE_PROVIDER_SITE_OTHER): Payer: Medicare Other | Admitting: Family Medicine

## 2021-05-02 ENCOUNTER — Encounter (HOSPITAL_COMMUNITY): Payer: Self-pay | Admitting: Emergency Medicine

## 2021-05-02 DIAGNOSIS — R300 Dysuria: Secondary | ICD-10-CM

## 2021-05-02 DIAGNOSIS — R350 Frequency of micturition: Secondary | ICD-10-CM | POA: Diagnosis not present

## 2021-05-02 DIAGNOSIS — R21 Rash and other nonspecific skin eruption: Secondary | ICD-10-CM | POA: Diagnosis not present

## 2021-05-02 DIAGNOSIS — N3000 Acute cystitis without hematuria: Secondary | ICD-10-CM | POA: Insufficient documentation

## 2021-05-02 DIAGNOSIS — Z113 Encounter for screening for infections with a predominantly sexual mode of transmission: Secondary | ICD-10-CM

## 2021-05-02 DIAGNOSIS — B9689 Other specified bacterial agents as the cause of diseases classified elsewhere: Secondary | ICD-10-CM

## 2021-05-02 LAB — POCT URINALYSIS DIPSTICK, ED / UC
Bilirubin Urine: NEGATIVE
Glucose, UA: 100 mg/dL — AB
Hgb urine dipstick: NEGATIVE
Ketones, ur: NEGATIVE mg/dL
Nitrite: POSITIVE — AB
Protein, ur: NEGATIVE mg/dL
Specific Gravity, Urine: >=1.03 (ref 1.005–1.030)
Urobilinogen, UA: 1 mg/dL (ref 0.0–1.0)
pH: 5 (ref 5.0–8.0)

## 2021-05-02 MED ORDER — CEPHALEXIN 500 MG PO CAPS: 500.0000 mg | ORAL_CAPSULE | Freq: Two times a day (BID) | ORAL | 0 refills | Status: AC

## 2021-05-02 NOTE — ED Provider Notes (Signed)
Michael [name]    CSN: [id number] Arrival date & time: 05/02/21  1634      History   Chief Complaint Chief Complaint  Patient presents with  . Dysuria    HPI Khallid Brummitt is a 50 y.o. male comes to the urgent care with complaints of dysuria and some urgency over the past couple of days.  Patient says symptoms started insidiously and has been persistent.  No flank pain, nausea, vomiting, fever or chills.  No abdominal pain or distention.  Patient denies any penile discharge.  He is in a monogamous relationship.  No penile rash.Marland Kitchen   HPI  Past Medical History:  Diagnosis Date  . Anxiety   . Depression    Questionable per records. Not on any medication.   Marland Kitchen History of kidney stones   . Hx of echocardiogram    a. Echo 12/13/12: Mild LVH, EF 73-53%, grade 1 diastolic dysfunction, mild LAE, mild RVE, mild RAE  . Hypertension   . Insomnia   . Migraine   . Noncompliance   . OSA (obstructive sleep apnea)    a. Sleep Study 02/2013:  mod OSA, AHI 33 per hour, O2 sat nadir 85%    Patient Active Problem List   Diagnosis Date Noted  . Morbid obesity with body mass index (BMI) of 40.0 to 44.9 in adult [facility]) 09/21/2019  . CHF (congestive heart failure), NYHA class I, acute, diastolic ([place]) 29/92/4268  . Central sleep apnea comorbid with prescribed opioid use 09/21/2019  . History of uncontrolled hypertension 09/21/2019  . Complex sleep apnea syndrome 08/18/2019  . Claustrophobia 08/18/2019  . Anxiety associated with depression 08/18/2019  . Sleep disturbance 10/01/2018  . Low HDL (under 40) 05/11/2017  . Moderate major depression ([place]) 05/11/2017  . Hypokalemia 04/04/2017  . Morbid obesity ([place]) 04/04/2017  . Chest wall pain 04/04/2017  . Asthma 02/13/2016  . Non compliance with medical treatment 06/13/2015  . OSA (obstructive sleep apnea) 04/06/2013  . Abnormal EKG 11/08/2012  . Essential hypertension   . HTN (hypertension), malignant 04/29/2012  . Migraine headache with  aura 04/29/2012    Past Surgical History:  Procedure Laterality Date  . Admission  04/2012   Malignant HTN, HA.  CT head negative, cardiology consult.    . CYSTOSCOPY WITH RETROGRADE PYELOGRAM, URETEROSCOPY AND STENT PLACEMENT Left 01/24/2019   Procedure: CYSTOSCOPY WITH RETROGRADE PYELOGRAM, URETEROSCOPY AND STENT PLACEMENT;  Surgeon: Cleon Gustin, MD;  Location[facility];  Service: Urology;  Laterality: Left;  . HOLMIUM LASER APPLICATION Left 3/41/9622   Procedure: HOLMIUM LASER APPLICATION;  Surgeon: Cleon Gustin, MD;  Location: [facility];  Service: Urology;  Laterality: Left;  Marland Kitchen MANDIBLE SURGERY  1998   metal plate       Home Medications    Prior to Admission medications   Medication Sig Start Date End Date Taking? Authorizing Provider  cephALEXin (KEFLEX) 500 MG capsule Take 1 capsule (500 mg total) by mouth 2 (two) times daily for 5 days. 05/02/21 05/07/21 Yes Zaki Gertsch, Myrene Galas, MD  acetaminophen (TYLENOL) 325 MG tablet Take 650 mg by mouth every 6 (six) hours as needed for mild pain or headache.    [provider]  amLODipine (NORVASC) 10 MG tablet Take 1 tablet (10 mg total) by mouth daily. 02/27/21   [name], MD  baclofen (LIORESAL) 10 MG tablet TAKE 1/2 TO 1 (ONE-HALF TO ONE) TABLET BY MOUTH THREE TIMES DAILY AS NEEDED FOR MUSCLE SPASM 03/21/21  [provider]  buPROPion (WELLBUTRIN XL) 150 MG 24 hr tablet Take 1 tablet by mouth every day 04/23/21   [name], MD  buPROPion (WELLBUTRIN XL) 300 MG 24 hr tablet Take 1 tablet (300 mg total) by mouth daily. 02/27/21   [name], MD  carvedilol (COREG) 6.25 MG tablet Take 1 tablet (6.25 mg total) by mouth 2 (two) times daily. 04/22/21   [name], MD  celecoxib (CELEBREX) 200 MG capsule Take 1 capsule (200 mg total) by mouth 2 (two) times daily as needed. 12/27/20   Hilts, Legrand Como, MD  chlorthalidone (HYGROTON) 50 MG tablet Take 1 tablet by  mouth every day 02/13/21   Maximiano Coss, NP  gabapentin (NEURONTIN) 100 MG capsule 1 PO q HS, may increase to 1 PO TID if needed 04/08/21   Hilts, Michael, MD  hydrocortisone (ANUSOL-HC) 25 MG suppository Place 1 suppository (25 mg total) rectally 2 (two) times daily. 02/27/21   [name], MD  Lidocaine, Anorectal, 5 % GEL Apply pea-sized amount to the perianal skin up to 3 times per day as needed. 02/27/21   [name], MD  lisinopril (ZESTRIL) 40 MG tablet Take 1 tablet by mouth every day 02/13/21   Maximiano Coss, NP  potassium chloride (KLOR-CON) 10 MEQ tablet Take 1 tablet by mouth every day 09/16/20   [name], MD  propranolol ER (INDERAL LA) 80 MG 24 hr capsule Take 1 capsule (80 mg total) by mouth daily. 11/26/20   Maximiano Coss, NP  QUEtiapine (SEROQUEL) 50 MG tablet Take 1 tablet (50 mg total) by mouth at bedtime. 11/26/20   Maximiano Coss, NP  tiZANidine (ZANAFLEX) 2 MG tablet Take 1-2 tablets (2-4 mg total) by mouth every 6 (six) hours as needed for muscle spasms. 03/11/21   Hilts, Legrand Como, MD  valACYclovir (VALTREX) 1000 MG tablet Take 1 tablet (1,000 mg total) by mouth 2 (two) times daily. 03/20/21   Maximiano Coss, NP    Family History Family History  Problem Relation Age of Onset  . Cancer Father        prostate, colon- age was in his 25's  . Heart disease Father 73       MI   . Hypertension Father   . Colon cancer Father   . Diabetes Mother   . Hypertension Mother   . Hypertension Brother   . Cancer Maternal Grandfather   . Cancer Paternal Grandfather   . Esophageal cancer Neg Hx   . Stomach cancer Neg Hx   . Rectal cancer Neg Hx     Social History Social History   Tobacco Use  . Smoking status: Never Smoker  . Smokeless tobacco: Never Used  Substance Use Topics  . Alcohol use: No    Alcohol/week: 0.0 standard drinks  . Drug use: No     Allergies   Statins   Review of Systems Review of Systems  Respiratory: Negative.    Gastrointestinal: Negative.   Genitourinary: Positive for dysuria, frequency and urgency. Negative for penile pain.     Physical Exam Triage Vital Signs ED Triage Vitals  Enc Vitals Group     BP 05/02/21 1704 133/90     Pulse Rate 05/02/21 1704 79     Resp 05/02/21 1704 18     Temp 05/02/21 1704 98 F (36.7 C)     Temp Source 05/02/21 1704 Oral     SpO2 05/02/21 1704 94 %     Weight --  Height --      Head Circumference --      Peak Flow --      Pain Score 05/02/21 1702 3     Pain Loc --      Pain Edu? --      Excl. in [place]? --    No data found.  Updated Vital Signs BP 133/90 (BP Location: Right Arm)   Pulse 79   Temp 98 F (36.7 C) (Oral)   Resp 18   SpO2 94%   Visual Acuity Right Eye Distance:   Left Eye Distance:   Bilateral Distance:    Right Eye Near:   Left Eye Near:    Bilateral Near:     Physical Exam Vitals and nursing note reviewed.  Constitutional:      General: He is not in acute distress.    Appearance: He is not ill-appearing.  Cardiovascular:     Rate and Rhythm: Normal rate and regular rhythm.  Pulmonary:     Effort: Pulmonary effort is normal.     Breath sounds: Normal breath sounds.  Abdominal:     General: Abdomen is flat. Bowel sounds are normal.  Neurological:     Mental Status: He is alert.      UC Treatments / Results  Labs (all labs ordered are listed, but only abnormal results are displayed) Labs Reviewed  POCT URINALYSIS DIPSTICK, ED / UC - Abnormal; Notable for the following components:      Result Value   Glucose, UA 100 (*)    Nitrite POSITIVE (*)    Leukocytes,Ua TRACE (*)    All other components within normal limits  URINE CULTURE    EKG   Radiology No results found.  Procedures Procedures (including critical care time)  Medications Ordered in UC Medications - No data to display  Initial Impression / Assessment and Plan / UC Course  I have reviewed the triage vital signs and the nursing  notes.  Pertinent labs & imaging results that were available during my care of the patient were reviewed by me and considered in my medical decision making (see chart for details).     1.  Acute cystitis without hematuria: Point-of-care urine is significant for nitrite and leukocyte esterase: Urine cultures have been sent Keflex 500 mg twice daily x5 days Increase oral fluid intake We will call patient if urine culture is abnormal. Return to urgent care if you have worsening symptoms. Final Clinical Impressions(s) / UC Diagnoses   Final diagnoses:  Acute cystitis without hematuria     Discharge Instructions     Increase oral fluid intake Take antibiotics as prescribed If symptoms worsen please return to urgent care to be reevaluated We will call you with recommendations if urine cultures are significant.   ED Prescriptions    Medication Sig Dispense Auth. Provider   cephALEXin (KEFLEX) 500 MG capsule Take 1 capsule (500 mg total) by mouth 2 (two) times daily for 5 days. 10 capsule Kaiea Esselman, Myrene Galas, MD     PDMP not reviewed this encounter.   Chase Picket, MD 05/02/21 [phone]

## 2021-05-02 NOTE — ED Triage Notes (Signed)
Pt presents today with c/o of painful urination. Denies penile discharge.

## 2021-05-02 NOTE — Discharge Instructions (Addendum)
Increase oral fluid intake Take antibiotics as prescribed If symptoms worsen please return to urgent care to be reevaluated We will call you with recommendations if urine cultures are significant.

## 2021-05-02 NOTE — Progress Notes (Signed)
Virtual Visit via audio Note  3:02 PM no answer on phone after no connection on video with repeat invite. 3:04 PM connected  I connected with Michael [name] on 05/02/21 at 2:55 PM by phone- not connected by video and no response after repeat invite sent.  Enabled telemedicine application and verified that I am speaking with the correct person using two identifiers.  Patient location:home  My location: office - [place].  I discussed the limitations, risks, security and privacy concerns of performing an evaluation and management service by telephone and the availability of in person appointments. I also discussed with the patient that there may be a patient responsible charge related to this service. The patient expressed understanding and agreed to proceed, consent obtained  Chief complaint:  Chief Complaint  Patient presents with  . Dysuria    Pt has been seen for STI check by morrow which was normal, pt reports some slight pain with urination since that time, pt notes the rx richard had given did not clear sxs.    History of Present Illness: Michael [name] is a 50 y.o. male  Dysuria Last evaluated by my colleague on March 23.  In office urinalysis with trace intact blood but negative nitrite, LE.  Urine cytology was negative for gonorrhea, chlamydia, trichomonas.  Previous IgG testing for HSV had been positive.  Was treated with Valtrex as he did report some bumps, mild tenderness of the base of the glans penis on his March 23 visit with Kathrin Ruddy, NP  No further blisters on tips of penis. Small bumps on testicles, but no blisters or pain in that area. Present for past few weeks. decreasing in amount,  Improving without treatment.   Still some difficulty with urinating. Some difficulty with starting urination, slight discomfort with ejaculation. No penile d/c, possible small amount of blood in urine today. Slight increased frequency.   Same sexual partner. She has not had STI's.     Patient Active Problem List   Diagnosis Date Noted  . Morbid obesity with body mass index (BMI) of 40.0 to 44.9 in adult [facility]) 09/21/2019  . CHF (congestive heart failure), NYHA class I, acute, diastolic ([place]) 35/36/1443  . Central sleep apnea comorbid with prescribed opioid use 09/21/2019  . History of uncontrolled hypertension 09/21/2019  . Complex sleep apnea syndrome 08/18/2019  . Claustrophobia 08/18/2019  . Anxiety associated with depression 08/18/2019  . Sleep disturbance 10/01/2018  . Low HDL (under 40) 05/11/2017  . Moderate major depression ([place]) 05/11/2017  . Hypokalemia 04/04/2017  . Morbid obesity ([place]) 04/04/2017  . Chest wall pain 04/04/2017  . Asthma 02/13/2016  . Non compliance with medical treatment 06/13/2015  . OSA (obstructive sleep apnea) 04/06/2013  . Abnormal EKG 11/08/2012  . Essential hypertension   . HTN (hypertension), malignant 04/29/2012  . Migraine headache with aura 04/29/2012   Past Medical History:  Diagnosis Date  . Anxiety   . Depression    Questionable per records. Not on any medication.   Marland Kitchen History of kidney stones   . Hx of echocardiogram    a. Echo 12/13/12: Mild LVH, EF 15-40%, grade 1 diastolic dysfunction, mild LAE, mild RVE, mild RAE  . Hypertension   . Insomnia   . Migraine   . Noncompliance   . OSA (obstructive sleep apnea)    a. Sleep Study 02/2013:  mod OSA, AHI 33 per hour, O2 sat nadir 85%   Past Surgical History:  Procedure Laterality Date  . Admission  04/2012  Malignant HTN, HA.  CT head negative, cardiology consult.    . CYSTOSCOPY WITH RETROGRADE PYELOGRAM, URETEROSCOPY AND STENT PLACEMENT Left 01/24/2019   Procedure: CYSTOSCOPY WITH RETROGRADE PYELOGRAM, URETEROSCOPY AND STENT PLACEMENT;  Surgeon: Cleon Gustin, MD;  Location: [facility];  Service: Urology;  Laterality: Left;  . HOLMIUM LASER APPLICATION Left 9/62/2297   Procedure: HOLMIUM LASER APPLICATION;  Surgeon: Cleon Gustin,  MD;  Location: [facility];  Service: Urology;  Laterality: Left;  Marland Kitchen MANDIBLE SURGERY  1998   metal plate   Allergies  Allergen Reactions  . Statins Other (See Comments)    unknown   Prior to Admission medications   Medication Sig Start Date End Date Taking? Authorizing Provider  acetaminophen (TYLENOL) 325 MG tablet Take 650 mg by mouth every 6 (six) hours as needed for mild pain or headache.    [provider]  amLODipine (NORVASC) 10 MG tablet Take 1 tablet (10 mg total) by mouth daily. 02/27/21   [name], MD  amoxicillin (AMOXIL) 500 MG capsule Take 500 mg by mouth 3 (three) times daily. 11/27/20   [provider]  baclofen (LIORESAL) 10 MG tablet TAKE 1/2 TO 1 (ONE-HALF TO ONE) TABLET BY MOUTH THREE TIMES DAILY AS NEEDED FOR MUSCLE SPASM 03/21/21   [provider]  buPROPion (WELLBUTRIN XL) 150 MG 24 hr tablet Take 1 tablet by mouth every day 04/23/21   [name], MD  buPROPion (WELLBUTRIN XL) 300 MG 24 hr tablet Take 1 tablet (300 mg total) by mouth daily. 02/27/21   [name], MD  carvedilol (COREG) 6.25 MG tablet Take 1 tablet (6.25 mg total) by mouth 2 (two) times daily. 04/22/21   [name], MD  celecoxib (CELEBREX) 200 MG capsule Take 1 capsule (200 mg total) by mouth 2 (two) times daily as needed. 12/27/20   Hilts, Legrand Como, MD  chlorthalidone (HYGROTON) 50 MG tablet Take 1 tablet by mouth every day 02/13/21   Maximiano Coss, NP  gabapentin (NEURONTIN) 100 MG capsule 1 PO q HS, may increase to 1 PO TID if needed 04/08/21   Hilts, Michael, MD  hydrocortisone (ANUSOL-HC) 25 MG suppository Place 1 suppository (25 mg total) rectally 2 (two) times daily. 02/27/21   [name], MD  Lidocaine, Anorectal, 5 % GEL Apply pea-sized amount to the perianal skin up to 3 times per day as needed. 02/27/21   [name], MD  lisinopril (ZESTRIL) 40 MG tablet Take 1 tablet by mouth every day 02/13/21   Maximiano Coss, NP   potassium chloride (KLOR-CON) 10 MEQ tablet Take 1 tablet by mouth every day 09/16/20   [name], MD  propranolol ER (INDERAL LA) 80 MG 24 hr capsule Take 1 capsule (80 mg total) by mouth daily. 11/26/20   Maximiano Coss, NP  QUEtiapine (SEROQUEL) 50 MG tablet Take 1 tablet (50 mg total) by mouth at bedtime. 11/26/20   Maximiano Coss, NP  tiZANidine (ZANAFLEX) 2 MG tablet Take 1-2 tablets (2-4 mg total) by mouth every 6 (six) hours as needed for muscle spasms. 03/11/21   Hilts, Legrand Como, MD  valACYclovir (VALTREX) 1000 MG tablet Take 1 tablet (1,000 mg total) by mouth 2 (two) times daily. 03/20/21   Maximiano Coss, NP   Social History   Socioeconomic History  . Marital status: Divorced    Spouse name: Not on file  . Number of children: 4  . Years of education: Not on file  .  Highest education level: Not on file  Occupational History  . Occupation: Unemployed//disabled  Tobacco Use  . Smoking status: Never Smoker  . Smokeless tobacco: Never Used  Substance and Sexual Activity  . Alcohol use: No    Alcohol/week: 0.0 standard drinks  . Drug use: No  . Sexual activity: Yes    Comment: per pt's blue health form - 1 sex partner in last 12 months  Other Topics Concern  . Not on file  Social History Narrative   Marital status: single; living with fiance      Children: 4 children, no grandchildren.      Employment:  Disability for learning disabilities since 2012.   Right-handed   Caffeine: occasional         Social Determinants of Radio broadcast assistant Strain: Not on file  Food Insecurity: Not on file  Transportation Needs: Not on file  Physical Activity: Not on file  Stress: Not on file  Social Connections: Not on file  Intimate Partner Violence: Not on file    Observations/Objective: There were no vitals filed for this visit. No distress. All questions answered. Understanding of plan expressed.   Assessment and Plan: Rash on scrotum  Dysuria  -in office  exam tomorrow, with labs. ddx includes infectious urethritis, prostatitis. Will also need to examine rash, but less likely HSV.    Follow Up Instructions:   in office tomorrow.  I discussed the assessment and treatment plan with the patient. The patient was provided an opportunity to ask questions and all were answered. The patient agreed with the plan and demonstrated an understanding of the instructions.   The patient was advised to call back or seek an in-person evaluation if the symptoms worsen or if the condition fails to improve as anticipated.  I provided 12 minutes of non-face-to-face time during this encounter.   [name], MD

## 2021-05-03 ENCOUNTER — Ambulatory Visit: Payer: Medicare Other | Admitting: Family Medicine

## 2021-05-04 LAB — URINE CULTURE: Culture: NO GROWTH

## 2021-05-10 ENCOUNTER — Telehealth: Payer: Self-pay | Admitting: Physical Medicine and Rehabilitation

## 2021-05-10 DIAGNOSIS — R300 Dysuria: Secondary | ICD-10-CM | POA: Diagnosis not present

## 2021-05-10 DIAGNOSIS — R3916 Straining to void: Secondary | ICD-10-CM | POA: Diagnosis not present

## 2021-05-10 NOTE — Telephone Encounter (Signed)
Left S1 TF on 04/30/21. He states that he had minimal relief for the first few days following the injection, but pain is now at the same level as before. Please advise.

## 2021-05-10 NOTE — Telephone Encounter (Signed)
Patient called requesting a call back for medical advice. Patient states he had back injections last week and it did not work. Pt would like to know what the next steps are moving forward. Patient phone number is [phone].

## 2021-05-13 NOTE — Telephone Encounter (Signed)
Pt was approve.

## 2021-05-13 NOTE — Telephone Encounter (Signed)
Is auth needed for L5-S1 IL? Scheduled for 5/26 with driver and no blood thinners.

## 2021-05-19 ENCOUNTER — Other Ambulatory Visit: Payer: Self-pay | Admitting: Family Medicine

## 2021-05-19 ENCOUNTER — Other Ambulatory Visit: Payer: Self-pay | Admitting: Registered Nurse

## 2021-05-19 DIAGNOSIS — G479 Sleep disorder, unspecified: Secondary | ICD-10-CM

## 2021-05-19 DIAGNOSIS — G43709 Chronic migraine without aura, not intractable, without status migrainosus: Secondary | ICD-10-CM

## 2021-05-19 DIAGNOSIS — K6289 Other specified diseases of anus and rectum: Secondary | ICD-10-CM

## 2021-05-19 DIAGNOSIS — I10 Essential (primary) hypertension: Secondary | ICD-10-CM

## 2021-05-21 MED ORDER — HYDROCORTISONE ACETATE 25 MG RE SUPP: 25.0000 mg | Freq: Two times a day (BID) | RECTAL | 0 refills | Status: DC

## 2021-05-21 MED ORDER — LIDOCAINE (ANORECTAL) 5 % EX GEL: CUTANEOUS | 0 refills | Status: DC

## 2021-05-21 NOTE — Telephone Encounter (Signed)
Patient is requesting a refill of the following medications: Requested Prescriptions   Pending Prescriptions Disp Refills   propranolol ER (INDERAL LA) 80 MG 24 hr capsule 90 capsule 0    Sig: Take 1 capsule (80 mg total) by mouth daily.   QUEtiapine (SEROQUEL) 50 MG tablet 90 tablet 0    Sig: Take 1 tablet (50 mg total) by mouth at bedtime.    Date of patient request: 05/19/2021 Last office visit: 03/20/21 Date of last refill: 11/26/2020 Last refill amount: 90 0R Follow up time period per chart: PRN

## 2021-05-21 NOTE — Telephone Encounter (Signed)
Patient is requesting a refill of the following medications: Requested Prescriptions   Pending Prescriptions Disp Refills   Lidocaine, Anorectal, 5 % GEL 30 g 0    Sig: Apply pea-sized amount to the perianal skin up to 3 times per day as needed.   hydrocortisone (ANUSOL-HC) 25 MG suppository 12 suppository 0    Sig: Place 1 suppository (25 mg total) rectally 2 (two) times daily.    Date of patient request: 05/19/2021 Last office visit: 05/02/21 Date of last refill: 02/27/21 Last refill amount: 30g Follow up time period per chart: per last note "pt to follow up in office tomorrow" Pt went to ED that night and did not make appt for 05/03/21

## 2021-05-22 MED ORDER — PROPRANOLOL HCL ER 80 MG PO CP24: 80.0000 mg | ORAL_CAPSULE | Freq: Every day | ORAL | 0 refills | Status: DC

## 2021-05-22 MED ORDER — QUETIAPINE FUMARATE 50 MG PO TABS: 50.0000 mg | ORAL_TABLET | Freq: Every day | ORAL | 0 refills | Status: DC

## 2021-05-23 ENCOUNTER — Ambulatory Visit: Payer: Self-pay

## 2021-05-23 ENCOUNTER — Ambulatory Visit (INDEPENDENT_AMBULATORY_CARE_PROVIDER_SITE_OTHER): Payer: Medicare Other | Admitting: Physical Medicine and Rehabilitation

## 2021-05-23 ENCOUNTER — Other Ambulatory Visit: Payer: Self-pay

## 2021-05-23 VITALS — BP 134/101 | HR 87

## 2021-05-23 DIAGNOSIS — M5416 Radiculopathy, lumbar region: Secondary | ICD-10-CM | POA: Diagnosis not present

## 2021-05-23 MED ORDER — BETAMETHASONE SOD PHOS & ACET 6 (3-3) MG/ML IJ SUSP
12.0000 mg | Freq: Once | INTRAMUSCULAR | Status: AC
  Administered 2021-05-23: 12 mg

## 2021-05-23 NOTE — Patient Instructions (Signed)

## 2021-05-23 NOTE — Progress Notes (Signed)
Last injection helped some for a few days. Left sided low back pain. Left lateral thigh pain- states he feels a knot on outer thigh. Thigh pain is worse lying on left side. Numeric Pain Rating Scale and Functional Assessment Average Pain 10   In the last MONTH (on 0-10 scale) has pain interfered with the following?  1. General activity like being  able to carry out your everyday physical activities such as walking, climbing stairs, carrying groceries, or moving a chair?  Rating(10)   +Driver, -BT, -Dye Allergies.

## 2021-05-24 NOTE — Progress Notes (Signed)
Hyrum Kuper - 50 y.o. male MRN [id number]  Date of birth: July 25, 1971  Office Visit Note: Visit Date: 05/23/2021 PCP: Pcp, No Referred by: No ref. provider found  Subjective: Chief Complaint  Patient presents with  . Lower Back - Pain  . Left Thigh - Pain   HPI:  Thaddius Cain is a 50 y.o. male who comes in today at the request of Dr. Eunice Blase for planned Left L5-S1 Lumbar Interlaminar epidural steroid injection with fluoroscopic guidance.  The patient has failed conservative care including home exercise, medications, time and activity modification.  This injection will be diagnostic and hopefully therapeutic.  Please see requesting physician notes for further details and justification. MRI reviewed with images and spine model.  MRI reviewed in the note below.  Patient status post left S1 transforaminal dural steroid injection with diagnostic relief just not helping long enough.  MRI once again reviewed with disc herniation at L5-S1 with lateral recess narrowing left more than right also with changes above that level.  Also with significant epidural lipomatosis.  Mearl Latin try an interlaminar approach to get him some more relief.  Case is complicated by morbid obesity.   ROS Otherwise per HPI.  Assessment & Plan: Visit Diagnoses:    ICD-10-CM   1. Lumbar radiculopathy  M54.16 XR C-ARM NO REPORT    Epidural Steroid injection    betamethasone acetate-betamethasone sodium phosphate (CELESTONE) injection 12 mg    Plan: No additional findings.   Meds & Orders:  Meds ordered this encounter  Medications  . betamethasone acetate-betamethasone sodium phosphate (CELESTONE) injection 12 mg    Orders Placed This Encounter  Procedures  . XR C-ARM NO REPORT  . Epidural Steroid injection    Follow-up: Return if symptoms worsen or fail to improve.   Procedures: No procedures performed  Lumbar Epidural Steroid Injection - Interlaminar Approach with Fluoroscopic Guidance  Patient: Mahlik  Hanks      Date of Birth: 29-Jun-1971 MRN: [id number] PCP: Pcp, No      Visit Date: 05/23/2021   Universal Protocol:     Consent Given By: the patient  Position: PRONE  Additional Comments: Vital signs were monitored before and after the procedure. Patient was prepped and draped in the usual sterile fashion. The correct patient, procedure, and site was verified.   Injection Procedure Details:   Procedure diagnoses: Lumbar radiculopathy [M54.16]   Meds Administered:  Meds ordered this encounter  Medications  . betamethasone acetate-betamethasone sodium phosphate (CELESTONE) injection 12 mg     Laterality: Left  Location/Site:  L5-S1  Needle: 4.5 in., 20 ga. Tuohy  Needle Placement: Paramedian epidural  Findings:   -Comments: Excellent flow of contrast into the epidural space.  Procedure Details: Using a paramedian approach from the side mentioned above, the region overlying the inferior lamina was localized under fluoroscopic visualization and the soft tissues overlying this structure were infiltrated with 4 ml. of 1% Lidocaine without Epinephrine. The Tuohy needle was inserted into the epidural space using a paramedian approach.   The epidural space was localized using loss of resistance along with counter oblique bi-planar fluoroscopic views.  After negative aspirate for air, blood, and CSF, a 2 ml. volume of Isovue-250 was injected into the epidural space and the flow of contrast was observed. Radiographs were obtained for documentation purposes.    The injectate was administered into the level noted above.   Additional Comments:  The patient tolerated the procedure well Dressing: 2 x 2 sterile gauze and Band-Aid  Post-procedure details: Patient was observed during the procedure. Post-procedure instructions were reviewed.  Patient left the clinic in stable condition.     Clinical History: MRI LUMBAR SPINE WITHOUT CONTRAST  TECHNIQUE: Multiplanar,  multisequence MR imaging of the lumbar spine was performed. No intravenous contrast was administered.  COMPARISON:  None.  FINDINGS: Segmentation: Standard segmentation is assumed. The inferior-most fully formed intervertebral disc is labeled L5-S1.  Alignment:  No substantial sagittal subluxation.  Vertebrae: Degenerative/discogenic endplate signal changes about the L5-S1 disc.  Conus medullaris and cauda equina: Conus extends to the L2 level. Conus appears normal.  Paraspinal and other soft tissues: Bilateral renal cysts, partially imaged.  Disc levels:  T12-L1: No significant disc protrusion, foraminal stenosis, or canal stenosis.  L1-L2: No significant disc protrusion, foraminal stenosis, or canal stenosis.  L2-L3: Mild disc bulging and facet hypertrophy without significant canal or foraminal stenosis.  L3-L4: Mild disc bulging and facet hypertrophy without significant canal or foraminal stenosis.  L4-L5: Broad-based disc bulge with superimposed left foraminal disc protrusion. Bilateral facet hypertrophy and ligamentum flavum thickening. There is moderate canal and bilateral lateral recess stenosis at the inferior L4-L5 level and along the posterior aspect of the L5 vertebral body secondary to degenerative changes and prominent ventral epidural fat. Mild left foraminal stenosis  L5-S1: Prominent ventral epidural fat at L5 which moderately narrows the canal. Additionally, there is broad-based disc bulge with superimposed left subarticular/foraminal disc protrusion which results in moderate to severe left subarticular recess stenosis with suspected impingement of the descending left S1 nerve root. Mild right subarticular recess stenosis and mild canal stenosis  IMPRESSION: 1. At L5-S1 there is broad-based disc bulge with superimposed left subarticular/foraminal disc protrusion which results in moderate to severe left subarticular recess stenosis with  suspected impingement of the descending left S1 nerve root. Moderate left and mild right foraminal stenosis and mild to moderate canal stenosis at this level. 2. Moderate canal and bilateral lateral recess stenosis at the inferior L4-L5 level and at the L5 vertebral body levels secondary to prominent ventral epidural fat and degenerative change. Mild left foraminal stenosis at L4-L5   Electronically Signed   By: Margaretha Sheffield MD   On: 04/04/2021 08:32     Objective:  VS:  HT:    WT:   BMI:     BP:(!) 134/101  HR:87bpm  TEMP: ( )  RESP:  Physical Exam Vitals and nursing note reviewed.  Constitutional:      General: He is not in acute distress.    Appearance: Normal appearance. He is not ill-appearing.  HENT:     Head: Normocephalic and atraumatic.     Right Ear: External ear normal.     Left Ear: External ear normal.     Nose: No congestion.  Eyes:     Extraocular Movements: Extraocular movements intact.  Cardiovascular:     Rate and Rhythm: Normal rate.     Pulses: Normal pulses.  Pulmonary:     Effort: Pulmonary effort is normal. No respiratory distress.  Abdominal:     General: There is no distension.     Palpations: Abdomen is soft.  Musculoskeletal:        General: No tenderness or signs of injury.     Cervical back: Neck supple.     Right lower leg: No edema.     Left lower leg: No edema.     Comments: Patient has good distal strength without clonus.  Skin:    Findings: No erythema or  rash.  Neurological:     General: No focal deficit present.     Mental Status: He is alert and oriented to person, place, and time.     Sensory: No sensory deficit.     Motor: No weakness or abnormal muscle tone.     Coordination: Coordination normal.  Psychiatric:        Mood and Affect: Mood normal.        Behavior: Behavior normal.      Imaging: XR C-ARM NO REPORT  Result Date: 05/23/2021 Please see Notes tab for imaging impression.

## 2021-05-24 NOTE — Procedures (Signed)
Lumbar Epidural Steroid Injection - Interlaminar Approach with Fluoroscopic Guidance  Patient: Michael [name]      Date of Birth: October 20, 1971 MRN: [id number] PCP: Pcp, No      Visit Date: 05/23/2021   Universal Protocol:     Consent Given By: the patient  Position: PRONE  Additional Comments: Vital signs were monitored before and after the procedure. Patient was prepped and draped in the usual sterile fashion. The correct patient, procedure, and site was verified.   Injection Procedure Details:   Procedure diagnoses: Lumbar radiculopathy [M54.16]   Meds Administered:  Meds ordered this encounter  Medications  . betamethasone acetate-betamethasone sodium phosphate (CELESTONE) injection 12 mg     Laterality: Left  Location/Site:  L5-S1  Needle: 4.5 in., 20 ga. Tuohy  Needle Placement: Paramedian epidural  Findings:   -Comments: Excellent flow of contrast into the epidural space.  Procedure Details: Using a paramedian approach from the side mentioned above, the region overlying the inferior lamina was localized under fluoroscopic visualization and the soft tissues overlying this structure were infiltrated with 4 ml. of 1% Lidocaine without Epinephrine. The Tuohy needle was inserted into the epidural space using a paramedian approach.   The epidural space was localized using loss of resistance along with counter oblique bi-planar fluoroscopic views.  After negative aspirate for air, blood, and CSF, a 2 ml. volume of Isovue-250 was injected into the epidural space and the flow of contrast was observed. Radiographs were obtained for documentation purposes.    The injectate was administered into the level noted above.   Additional Comments:  The patient tolerated the procedure well Dressing: 2 x 2 sterile gauze and Band-Aid    Post-procedure details: Patient was observed during the procedure. Post-procedure instructions were reviewed.  Patient left the clinic in stable  condition.

## 2021-06-05 ENCOUNTER — Other Ambulatory Visit: Payer: Self-pay | Admitting: Family Medicine

## 2021-06-13 ENCOUNTER — Other Ambulatory Visit: Payer: Self-pay | Admitting: Family Medicine

## 2021-06-13 DIAGNOSIS — I10 Essential (primary) hypertension: Secondary | ICD-10-CM

## 2021-06-19 ENCOUNTER — Encounter: Payer: Self-pay | Admitting: Podiatry

## 2021-06-19 ENCOUNTER — Other Ambulatory Visit: Payer: Self-pay

## 2021-06-19 ENCOUNTER — Ambulatory Visit (INDEPENDENT_AMBULATORY_CARE_PROVIDER_SITE_OTHER): Payer: Medicare Other | Admitting: Podiatry

## 2021-06-19 DIAGNOSIS — M722 Plantar fascial fibromatosis: Secondary | ICD-10-CM | POA: Diagnosis not present

## 2021-06-19 MED ORDER — TRIAMCINOLONE ACETONIDE 10 MG/ML IJ SUSP
10.0000 mg | Freq: Once | INTRAMUSCULAR | Status: AC
  Administered 2021-06-19: 10 mg

## 2021-06-19 NOTE — Progress Notes (Signed)
Subjective:   Patient ID: Michael [name], male   DOB: 50 y.o.   MRN: [id number]   HPI Patient presents stating he has started to develop a lot of pain in his left heel again and stated he was doing better and that this started recently   ROS      Objective:  Physical Exam  Neurovascular status intact with continued inflammation plantar heel left with forefoot that is improved with pain along the medial band     Assessment:  Acute [place] fasciitis left     Plan:  We will get a focus conservatively again ultimate surgery or shockwave may be necessary sterile prep injected the plantar fascia 3 mg Kenalog 5 mg Xylocaine and advised this patient on boot and brace usage and reappoint as needed

## 2021-06-29 ENCOUNTER — Ambulatory Visit (HOSPITAL_COMMUNITY)
Admission: EM | Admit: 2021-06-29 | Discharge: 2021-06-29 | Disposition: A | Discharge status: Home / Self Care | Payer: Medicare Other | Attending: Physician Assistant | Admitting: Physician Assistant

## 2021-06-29 ENCOUNTER — Encounter (HOSPITAL_COMMUNITY): Payer: Self-pay

## 2021-06-29 ENCOUNTER — Emergency Department (HOSPITAL_COMMUNITY): Payer: Medicare Other

## 2021-06-29 ENCOUNTER — Encounter (HOSPITAL_COMMUNITY): Payer: Self-pay | Admitting: Emergency Medicine

## 2021-06-29 ENCOUNTER — Other Ambulatory Visit: Payer: Self-pay

## 2021-06-29 ENCOUNTER — Emergency Department (HOSPITAL_COMMUNITY)
Admission: EM | Admit: 2021-06-29 | Discharge: 2021-06-29 | Disposition: A | Discharge status: Home / Self Care | Payer: Medicare Other | Attending: Emergency Medicine | Admitting: Emergency Medicine

## 2021-06-29 DIAGNOSIS — J45909 Unspecified asthma, uncomplicated: Secondary | ICD-10-CM | POA: Insufficient documentation

## 2021-06-29 DIAGNOSIS — I509 Heart failure, unspecified: Secondary | ICD-10-CM | POA: Diagnosis not present

## 2021-06-29 DIAGNOSIS — R0602 Shortness of breath: Secondary | ICD-10-CM | POA: Insufficient documentation

## 2021-06-29 DIAGNOSIS — I110 Hypertensive heart disease with heart failure: Secondary | ICD-10-CM | POA: Insufficient documentation

## 2021-06-29 DIAGNOSIS — R079 Chest pain, unspecified: Secondary | ICD-10-CM | POA: Diagnosis not present

## 2021-06-29 DIAGNOSIS — Z79899 Other long term (current) drug therapy: Secondary | ICD-10-CM | POA: Insufficient documentation

## 2021-06-29 LAB — COMPREHENSIVE METABOLIC PANEL
ALT: 14 U/L (ref 0–44)
AST: 16 U/L (ref 15–41)
Albumin: 3.4 g/dL — ABNORMAL LOW (ref 3.5–5.0)
Alkaline Phosphatase: 46 U/L (ref 38–126)
Anion gap: 6 (ref 5–15)
BUN: 13 mg/dL (ref 6–20)
CO2: 29 mmol/L (ref 22–32)
Calcium: 8.9 mg/dL (ref 8.9–10.3)
Chloride: 104 mmol/L (ref 98–111)
Creatinine, Ser: 1.09 mg/dL (ref 0.61–1.24)
GFR, Estimated: >60 mL/min (ref >60)
Glucose, Bld: 107 mg/dL — ABNORMAL HIGH (ref 70–99)
Potassium: 3 mmol/L — ABNORMAL LOW (ref 3.5–5.1)
Sodium: 139 mmol/L (ref 135–145)
Total Bilirubin: 0.8 mg/dL (ref 0.3–1.2)
Total Protein: 7.2 g/dL (ref 6.5–8.1)

## 2021-06-29 LAB — CBC WITH DIFFERENTIAL/PLATELET
Abs Immature Granulocytes: 0.02 10*3/uL (ref 0.00–0.07)
Basophils Absolute: 0 10*3/uL (ref 0.0–0.1)
Basophils Relative: 0 %
Eosinophils Absolute: 0.1 10*3/uL (ref 0.0–0.5)
Eosinophils Relative: 1 %
HCT: 39.6 % (ref 39.0–52.0)
Hemoglobin: 12.7 g/dL — ABNORMAL LOW (ref 13.0–17.0)
Immature Granulocytes: 0 %
Lymphocytes Relative: 48 %
Lymphs Abs: 3.5 10*3/uL (ref 0.7–4.0)
MCH: 29.1 pg (ref 26.0–34.0)
MCHC: 32.1 g/dL (ref 30.0–36.0)
MCV: 90.8 fL (ref 80.0–100.0)
Monocytes Absolute: 0.4 10*3/uL (ref 0.1–1.0)
Monocytes Relative: 5 %
Neutro Abs: 3.4 10*3/uL (ref 1.7–7.7)
Neutrophils Relative %: 46 %
Platelets: 223 10*3/uL (ref 150–400)
RBC: 4.36 MIL/uL (ref 4.22–5.81)
RDW: 14.8 % (ref 11.5–15.5)
WBC: 7.4 10*3/uL (ref 4.0–10.5)
nRBC: 0 % (ref 0.0–0.2)

## 2021-06-29 LAB — TROPONIN I (HIGH SENSITIVITY)
Troponin I (High Sensitivity): 5 ng/L (ref <18)
Troponin I (High Sensitivity): 6 ng/L (ref <18)

## 2021-06-29 IMAGING — DX DG CHEST 2V
2 series · 2 of 2 positions shown · non-contrast
Comparison: [DATE]

CLINICAL DATA: Chest pain.

EXAM:
CHEST - 2 VIEW

[chest pa]
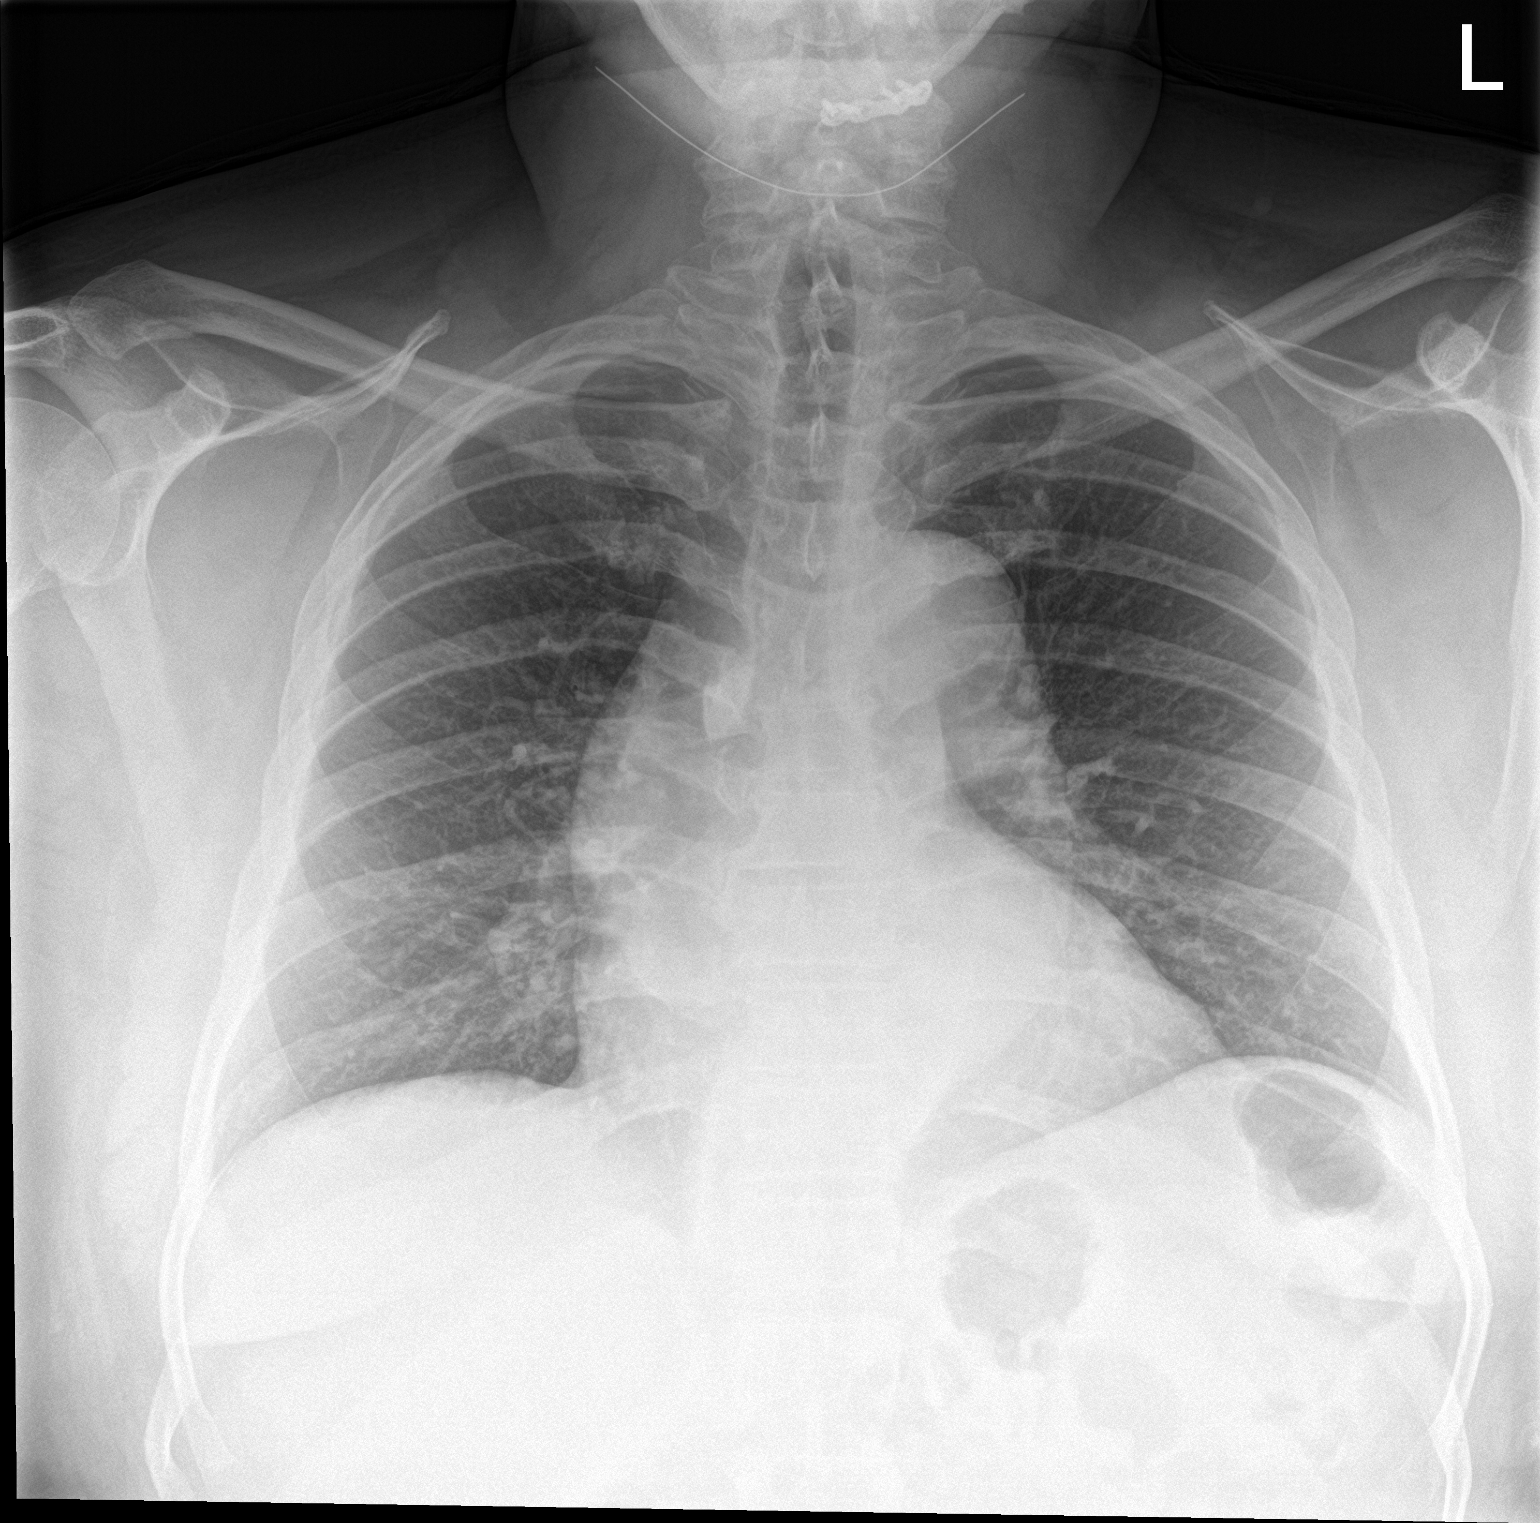

[chest lat]
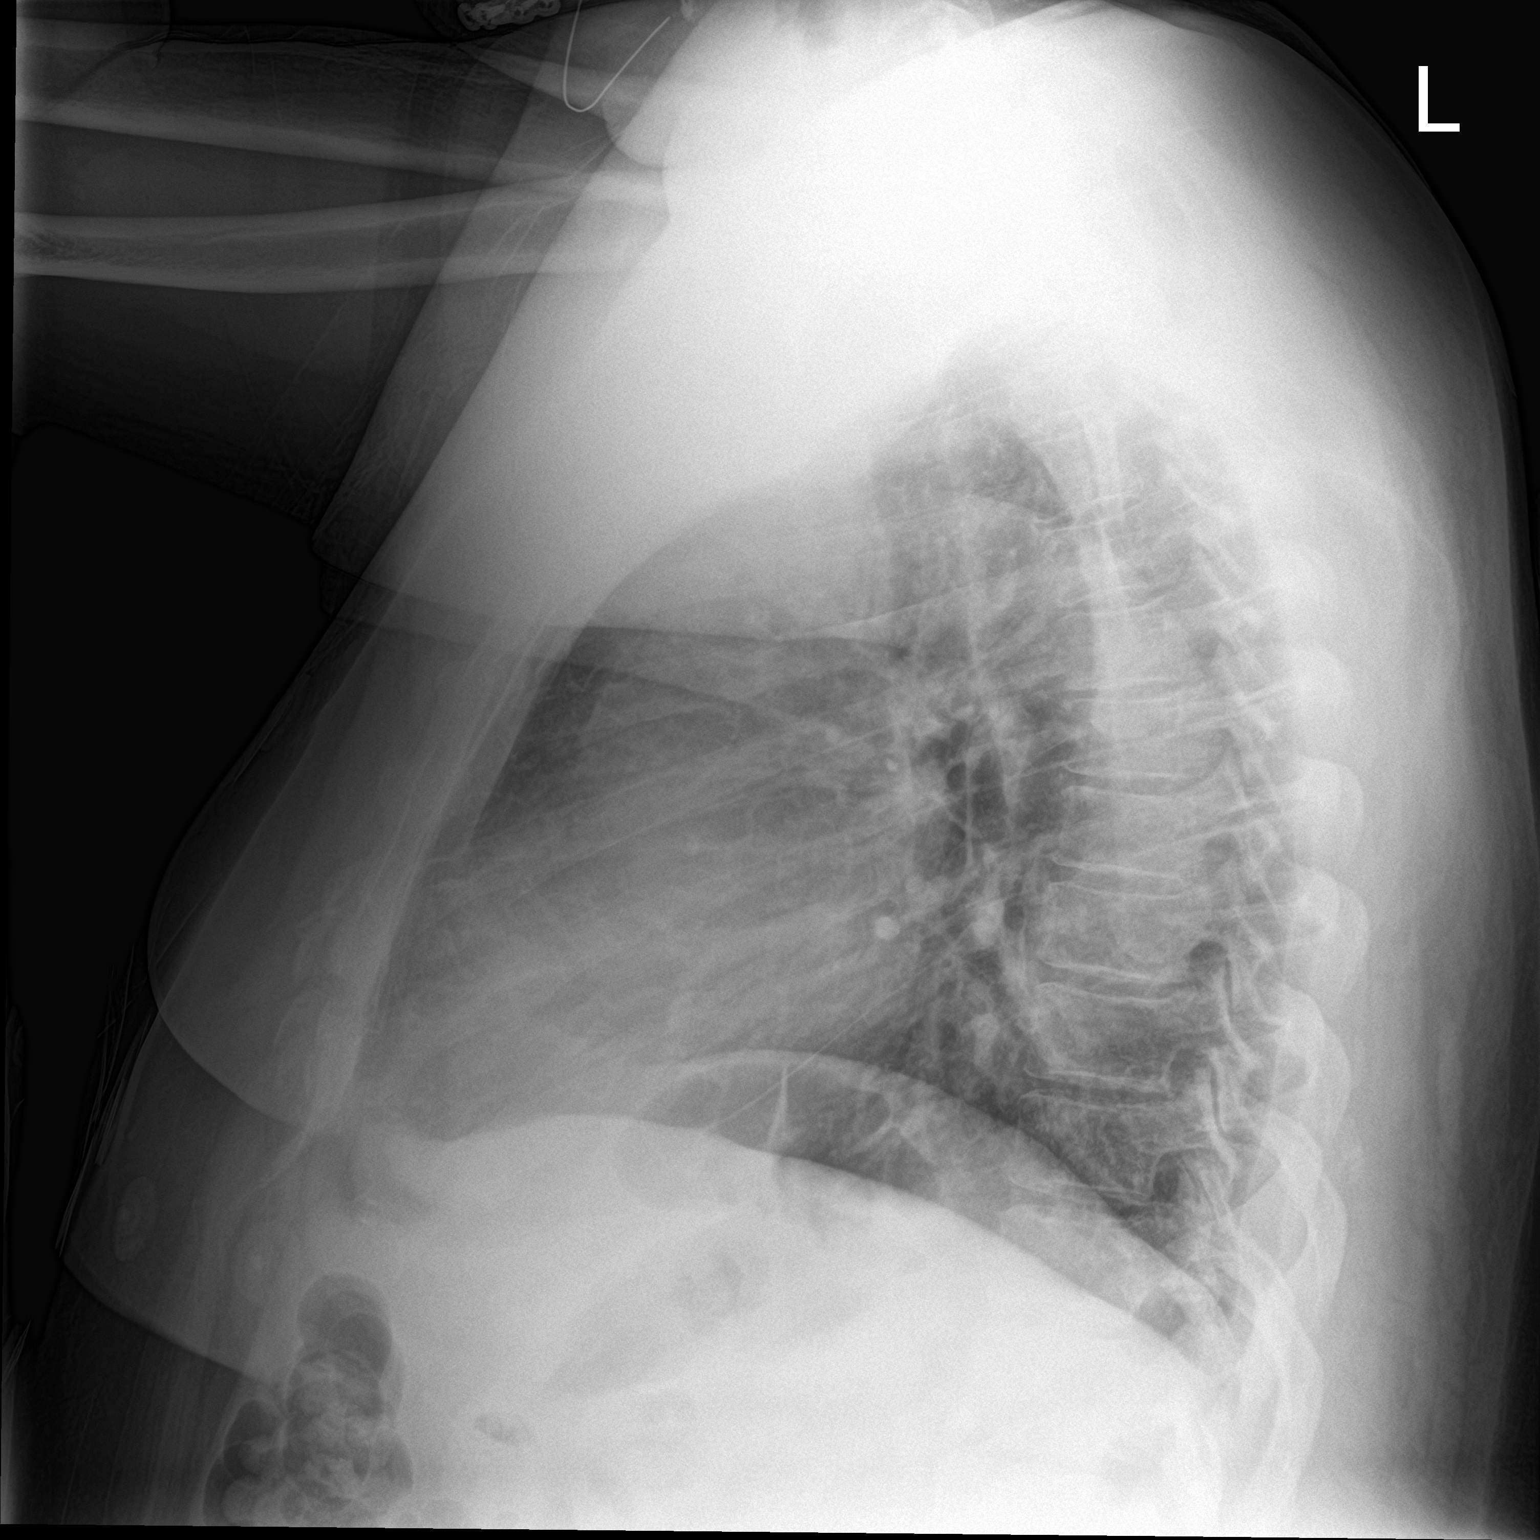

[2 of 2 positions shown; findings below may reference images not displayed]

FINDINGS: The heart size and mediastinal contours are within normal limits.
Both lungs are clear. The visualized skeletal structures are
unremarkable.
IMPRESSION: No active cardiopulmonary disease.

## 2021-06-29 MED ORDER — POTASSIUM CHLORIDE CRYS ER 20 MEQ PO TBCR
40.0000 meq | EXTENDED_RELEASE_TABLET | Freq: Once | ORAL | Status: AC
  Administered 2021-06-29: 40 meq via ORAL
  Filled 2021-06-29: qty 2

## 2021-06-29 NOTE — ED Triage Notes (Signed)
C/o L sided chest pain x 1 1/2 weeks.  Reports SOB with exertion when going up steps.  Denies nausea and vomiting.

## 2021-06-29 NOTE — ED Notes (Signed)
Patient discharge instructions reviewed with the patient. The patient verbalized understanding of instructions. Patient discharged. 

## 2021-06-29 NOTE — ED Provider Notes (Signed)
Emergency Medicine Provider Triage Evaluation Note  Michael [name] , a 50 y.o. male  was evaluated in triage.  Pt complains of pain to the left side chest pain.  Pain has been intermittent over the last week pain is worse with movement and touch.  No associated shortness of breath, nausea, vomiting, or diaphoresis with this pain.  Review of Systems  Positive: Chest pain Negative: Breath, nausea, vomiting, diaphoresis, leg swelling, palpitations  Physical Exam  BP (!) 155/114 (BP Location: Right Arm)   Pulse 71   Temp 97.9 F (36.6 C)   Resp 16   SpO2 100%  Gen:   Awake, no distress   Resp:  Normal effort,  MSK:   Moves extremities without difficulty  Other:  No tenderness or swelling to chest wall   Medical Decision Making  Medically screening exam initiated at 1:34 PM.  Appropriate orders placed.  [name] was informed that the remainder of the evaluation will be completed by another provider, this initial triage assessment does not replace that evaluation, and the importance of remaining in the ED until their evaluation is complete.  The patient appears stable so that the remainder of the work up may be completed by another provider.      Loni Beckwith, PA-C 06/29/21 1338    [name], MD 06/29/21 [phone]

## 2021-06-29 NOTE — Discharge Instructions (Addendum)
Take your muscle relaxants as needed for chest pain  See cardiology for stress test if you have persistent shortness of breath when you walk  Return to ER if you have worse chest pain, shortness of breath

## 2021-06-29 NOTE — ED Triage Notes (Signed)
Pt reports left-sided chest pain x 1 1/2 week. Muscle relaxer gives no relief. Denies headache, vision changes, nausea, vomiting, weakness.

## 2021-06-29 NOTE — ED Provider Notes (Signed)
[facility] EMERGENCY DEPARTMENT Provider Note   CSN: [id number] Arrival date & time: 06/29/21  1325     History No chief complaint on file.   Michael [name] is a 50 y.o. male history of anxiety and depression, hypertension, here presenting with chest pain.  Patient has left-sided chest pain going on for about the last week or so.  Patient states that it is worse with movement.  Patient states that he sometimes gets short of breath when he goes up the stairs but no pain at rest.  Patient denies any history of CAD or stents in his heart.  Denies any recent travel or history of blood clots  The history is provided by the patient.      Past Medical History:  Diagnosis Date   Anxiety    Depression    Questionable per records. Not on any medication.    History of kidney stones    Hx of echocardiogram    a. Echo 12/13/12: Mild LVH, EF 09-98%, grade 1 diastolic dysfunction, mild LAE, mild RVE, mild RAE   Hypertension    Insomnia    Migraine    Noncompliance    OSA (obstructive sleep apnea)    a. Sleep Study 02/2013:  mod OSA, AHI 33 per hour, O2 sat nadir 85%    Patient Active Problem List   Diagnosis Date Noted   Morbid obesity with body mass index (BMI) of 40.0 to 44.9 in adult [facility]) 09/21/2019   CHF (congestive heart failure), NYHA class I, acute, diastolic ([place]) 33/82/5053   Central sleep apnea comorbid with prescribed opioid use 09/21/2019   History of uncontrolled hypertension 09/21/2019   Complex sleep apnea syndrome 08/18/2019   Claustrophobia 08/18/2019   Anxiety associated with depression 08/18/2019   Sleep disturbance 10/01/2018   Low HDL (under 40) 05/11/2017   Moderate major depression ([place]) 05/11/2017   Hypokalemia 04/04/2017   Morbid obesity ([place]) 04/04/2017   Chest wall pain 04/04/2017   Asthma 02/13/2016   Non compliance with medical treatment 06/13/2015   OSA (obstructive sleep apnea) 04/06/2013   Abnormal EKG 11/08/2012   Essential  hypertension    HTN (hypertension), malignant 04/29/2012   Migraine headache with aura 04/29/2012    Past Surgical History:  Procedure Laterality Date   Admission  04/2012   Malignant HTN, HA.  CT head negative, cardiology consult.     CYSTOSCOPY WITH RETROGRADE PYELOGRAM, URETEROSCOPY AND STENT PLACEMENT Left 01/24/2019   Procedure: CYSTOSCOPY WITH RETROGRADE PYELOGRAM, URETEROSCOPY AND STENT PLACEMENT;  Surgeon: Cleon Gustin, MD;  Location: [facility];  Service: Urology;  Laterality: Left;   HOLMIUM LASER APPLICATION Left 9/76/7341   Procedure: HOLMIUM LASER APPLICATION;  Surgeon: Cleon Gustin, MD;  Location: [facility];  Service: Urology;  Laterality: Left;   MANDIBLE SURGERY  1998   metal plate       Family History  Problem Relation Age of Onset   Cancer Father        prostate, colon- age was in his 75's   Heart disease Father 25       MI    Hypertension Father    Colon cancer Father    Diabetes Mother    Hypertension Mother    Hypertension Brother    Cancer Maternal Grandfather    Cancer Paternal Grandfather    Esophageal cancer Neg Hx    Stomach cancer Neg Hx    Rectal cancer Neg Hx     Social  History   Tobacco Use   Smoking status: Never   Smokeless tobacco: Never  Vaping Use   Vaping Use: Never used  Substance Use Topics   Alcohol use: No    Alcohol/week: 0.0 standard drinks   Drug use: No    Home Medications Prior to Admission medications   Medication Sig Start Date End Date Taking? Authorizing Provider  acetaminophen (TYLENOL) 325 MG tablet Take 650 mg by mouth every 6 (six) hours as needed for mild pain or headache.   Yes [provider]  amLODipine (NORVASC) 10 MG tablet Take 1 tablet (10 mg total) by mouth daily. 02/27/21  Yes [name], MD  baclofen (LIORESAL) 10 MG tablet Take 10 mg by mouth 3 (three) times daily as needed for muscle spasms. 03/21/21  Yes [provider]   buPROPion (WELLBUTRIN XL) 300 MG 24 hr tablet Take 1 tablet (300 mg total) by mouth daily. 02/27/21  Yes [name], MD  carvedilol (COREG) 6.25 MG tablet Take 1 tablet (6.25 mg total) by mouth 2 (two) times daily. 04/22/21  Yes [name], MD  celecoxib (CELEBREX) 200 MG capsule Take 1 capsule (200 mg total) by mouth 2 (two) times daily as needed. Patient taking differently: Take 200 mg by mouth 2 (two) times daily as needed for moderate pain. 12/27/20  Yes Hilts, Legrand Como, MD  chlorthalidone (HYGROTON) 50 MG tablet Take 1 tablet by mouth every day Patient taking differently: Take 50 mg by mouth daily. 02/13/21  Yes Maximiano Coss, NP  gabapentin (NEURONTIN) 100 MG capsule 1 PO q HS, may increase to 1 PO TID if needed Patient taking differently: Take 100 mg by mouth at bedtime. 1 PO q HS, may increase to 1 PO TID if needed 04/08/21  Yes Hilts, Michael, MD  lisinopril (ZESTRIL) 40 MG tablet Take 1 tablet by mouth every day Patient taking differently: Take 40 mg by mouth daily. 02/13/21  Yes Maximiano Coss, NP  potassium chloride (KLOR-CON) 10 MEQ tablet Take 1 tablet by mouth every day Patient taking differently: Take 10 mEq by mouth daily. 09/16/20  Yes [name], MD  propranolol ER (INDERAL LA) 80 MG 24 hr capsule Take 1 capsule (80 mg total) by mouth daily. 05/22/21  Yes Maximiano Coss, NP  QUEtiapine (SEROQUEL) 50 MG tablet Take 1 tablet (50 mg total) by mouth at bedtime. Patient taking differently: Take 50 mg by mouth daily. 05/22/21  Yes Maximiano Coss, NP  tamsulosin (FLOMAX) 0.4 MG CAPS capsule Take 0.4 mg by mouth at bedtime.   Yes [provider]  valACYclovir (VALTREX) 1000 MG tablet Take 1 tablet (1,000 mg total) by mouth 2 (two) times daily. 03/20/21  Yes Maximiano Coss, NP  hydrocortisone (ANUSOL-HC) 25 MG suppository Place 1 suppository (25 mg total) rectally 2 (two) times daily. Patient not taking: No sig reported 05/21/21   [name], MD   Lidocaine, Anorectal, 5 % GEL Apply pea-sized amount to the perianal skin up to 3 times per day as needed. Patient not taking: No sig reported 05/21/21   [name], MD  tiZANidine (ZANAFLEX) 2 MG tablet TAKE 1 TO 2 TABLETS BY MOUTH EVERY 6 HOURS AS NEEDED FOR MUSCLE SPASM Patient not taking: No sig reported 06/05/21   Hilts, Michael, MD    Allergies    Statins  Review of Systems   Review of Systems  Cardiovascular:  Positive for chest pain.  All other systems reviewed and are negative.  Physical Exam  Updated Vital Signs BP (!) 120/93   Pulse 76   Temp 97.9 F (36.6 C)   Resp 19   SpO2 100%   Physical Exam Vitals and nursing note reviewed.  Constitutional:      Appearance: Normal appearance.  HENT:     Head: Normocephalic.     Nose: Nose normal.     Mouth/Throat:     Mouth: Mucous membranes are moist.  Eyes:     Extraocular Movements: Extraocular movements intact.     Pupils: Pupils are equal, round, and reactive to light.  Cardiovascular:     Rate and Rhythm: Normal rate and regular rhythm.     Pulses: Normal pulses.     Heart sounds: Normal heart sounds.  Pulmonary:     Effort: Pulmonary effort is normal.     Breath sounds: Normal breath sounds.     Comments: Reproducible chest wall tenderness Abdominal:     General: Abdomen is flat.     Palpations: Abdomen is soft.  Musculoskeletal:        General: Normal range of motion.     Cervical back: Normal range of motion and neck supple.  Skin:    General: Skin is warm.     Capillary Refill: Capillary refill takes less than 2 seconds.  Neurological:     General: No focal deficit present.     Mental Status: He is alert and oriented to person, place, and time.  Psychiatric:        Mood and Affect: Mood normal.        Behavior: Behavior normal.    ED Results / Procedures / Treatments   Labs (all labs ordered are listed, but only abnormal results are displayed) Labs Reviewed  CBC WITH DIFFERENTIAL/PLATELET  - Abnormal; Notable for the following components:      Result Value   Hemoglobin 12.7 (*)    All other components within normal limits  COMPREHENSIVE METABOLIC PANEL - Abnormal; Notable for the following components:   Potassium 3.0 (*)    Glucose, Bld 107 (*)    Albumin 3.4 (*)    All other components within normal limits  TROPONIN I (HIGH SENSITIVITY)  TROPONIN I (HIGH SENSITIVITY)    EKG EKG Interpretation  Date/Time:  Saturday June 29 2021 13:28:19 EDT Ventricular Rate:  86 PR Interval:  154 QRS Duration: 100 QT Interval:  386 QTC Calculation: 461 R Axis:   -19 Text Interpretation: Sinus rhythm with Premature atrial complexes Otherwise normal ECG premature atrial complex new since previous Confirmed by Wandra Arthurs [phone]) on 06/29/2021 4:52:59 PM  Radiology DG Chest 2 View  Result Date: 06/29/2021 CLINICAL DATA:  Chest pain. EXAM: CHEST - 2 VIEW COMPARISON:  04/03/2017 FINDINGS: The heart size and mediastinal contours are within normal limits. Both lungs are clear. The visualized skeletal structures are unremarkable. IMPRESSION: No active cardiopulmonary disease. Electronically Signed   By: Nolon Nations M.D.   On: 06/29/2021 14:14    Procedures Procedures   Medications Ordered in ED Medications - No data to display  ED Course  I have reviewed the triage vital signs and the nursing notes.  Pertinent labs & imaging results that were available during my care of the patient were reviewed by me and considered in my medical decision making (see chart for details).    MDM Rules/Calculators/A&P                         [facility]  is a 50 y.o. male here with chest pain. Left sided chest pain for a week. It is reproducible, likely MSK. Low suspicion for ACS vs PE. Will get trop x 2, labs, CXR.   6:27 PM Troponin negative x2.  Potassium is 3.0 and supplemented.  Likely musculoskeletal in nature.  Given that he has some shortness of breath going upstairs, I recommend that he  follow-up with cardiology for stress test.   Final Clinical Impression(s) / ED Diagnoses Final diagnoses:  None    Rx / DC Orders ED Discharge Orders     None        Drenda Freeze, MD 06/29/21 [place]

## 2021-07-03 NOTE — Progress Notes (Signed)
Subjective:    Michael [name] - 50 y.o. male MRN [id number]  Date of birth: 1971-03-30  HPI  Michael [name] is to establish care. Patient has a PMH significant for essential hypertension, migraine headache with aura, congestive heart failure, obstructive sleep apnea, asthma, chest wall pain, and moderate major depression.   Current issues and/or concerns: HOSPITAL DISCHARGE FOLLOW-UP: Visit 06/29/2021 at [facility] per MD note: Michael [name] is a 50 y.o. male here with chest pain. Left sided chest pain for a week. It is reproducible, likely MSK. Low suspicion for ACS vs PE. Will get trop x 2, labs, CXR.   6:27 PM Troponin negative x2.  Potassium is 3.0 and supplemented.  Likely musculoskeletal in nature.  Given that he has some shortness of breath going upstairs, I recommend that he follow-up with cardiology for stress test.  07/05/2021: Stable but still having intermittently.   2. WEIGHT MANAGEMENT: Reports highest weight was 310 pounds. Lowest weight was 160 pounds. Would like to loose at least 50 pounds. He is exercising regularly but having some back pain, followed by Orthopedics for this. He is eating healthier. Would like to begin a medication to assist with weight loss.   3. SLEEP APNEA: Reports current CPAP mask is not working well. Does not fit properly as a result sometimes using because of this reason. Has tried different masks in the past without success including nasal pillows. Would like to try Inspire implant for management.    ROS per HPI    Health Maintenance:  There are no preventive care reminders to display for this patient.   Past Medical History: Patient Active Problem List   Diagnosis Date Noted   CHF (congestive heart failure), NYHA class I, acute, diastolic ([place]) 09/38/1829   Central sleep apnea comorbid with prescribed opioid use 09/21/2019   History of uncontrolled hypertension 09/21/2019   Complex sleep apnea syndrome  08/18/2019   Claustrophobia 08/18/2019   Anxiety associated with depression 08/18/2019   Sleep disturbance 10/01/2018   Low HDL (under 40) 05/11/2017   Moderate major depression ([place]) 05/11/2017   Hypokalemia 04/04/2017   Morbid obesity ([place]) 04/04/2017   Chest wall pain 04/04/2017   Asthma 02/13/2016   Non compliance with medical treatment 06/13/2015   OSA (obstructive sleep apnea) 04/06/2013   Abnormal EKG 11/08/2012   Essential hypertension    HTN (hypertension), malignant 04/29/2012   Migraine headache with aura 04/29/2012     Social History   reports that he has never smoked. He has never used smokeless tobacco. He reports current drug use. Drug: Marijuana. He reports that he does not drink alcohol.   Family History  family history includes Cancer in his father, maternal grandfather, and paternal grandfather; Colon cancer in his father; Diabetes in his mother; Heart disease (age of onset: 48) in his father; Hypertension in his brother, father, and mother.   Medications: reviewed and updated   Objective:   Physical Exam BP 120/89 (BP Location: Left Arm, Patient Position: Sitting, Cuff Size: Large)   Pulse 75   Temp 98.2 F (36.8 C)   Resp 15   Ht 5\' 10"  (1.778 m)   Wt 295 lb 12.8 oz (134.2 kg)   SpO2 96%   BMI 42.44 kg/m  Physical Exam HENT:     Head: Normocephalic and atraumatic.  Eyes:     Extraocular Movements: Extraocular movements intact.     Conjunctiva/sclera: Conjunctivae normal.     Pupils: Pupils are equal, round, and  reactive to light.  Cardiovascular:     Rate and Rhythm: Normal rate and regular rhythm.     Pulses: Normal pulses.     Heart sounds: Normal heart sounds.  Pulmonary:     Effort: Pulmonary effort is normal.     Breath sounds: Normal breath sounds.  Musculoskeletal:     Cervical back: Normal range of motion and neck supple.  Neurological:     General: No focal deficit present.     Mental Status: He is alert and oriented to person,  place, and time.  Psychiatric:        Mood and Affect: Mood normal.        Behavior: Behavior normal.   Assessment & Plan:  1. Encounter to establish care: - Patient presents today to establish care.  - Return for annual physical examination, labs, and health maintenance. Arrive fasting meaning having no food for at least 8 hours prior to appointment. You may have only water or black coffee. Please take scheduled medications as normal.  2. Chest pain, unspecified type: - Patient with visit on 06/29/2021 at the [facility] Emergency Department for the same. Troponin negative. Chest x-ray normal. Suspected to be musculoskeletal in nature.  - Today patient reports ongoing intermittent chest pain. - Referral to Cardiology for further evaluation and management.  - Ambulatory referral to Cardiology  3. OSA (obstructive sleep apnea): - Patient would like the Inspire implant for management of sleep apnea.  - Referral to Pulmonology for further evaluation and management. - Ambulatory referral to Pulmonology  4. Encounter for weight management: 5. Class 3 severe obesity with body mass index (BMI) of 40.0 to 44.9 in adult, unspecified obesity type, unspecified whether serious comorbidity present [facility]): - Begin Semaglutide as prescribed.  - Counseled regarding the potential risk for medullary thyroid carcinoma with the use of Semaglutide. Particular symptoms of thyroid tumors to monitor for include a mass in the neck, dysphagia, dyspnea, and/or persistent hoarseness. Patient does not have personal or family history of thyroid cancers. Patient verbalized understanding. - Counseled on low-sodium, DASH diet and 150 minutes of moderate intensity exercise per week as tolerated. Discussed medication compliance, adverse effects. - Follow-up with primary provider in 4 weeks or sooner if needed.  - Semaglutide, 1 MG/DOSE, 4 MG/3ML SOPN; Inject 0.25 mg into the skin every 7 (seven) days.  Dispense: 0.75 mL;  Refill: 0 - Insulin Pen Needle (PEN NEEDLES) 31G X 8 MM MISC; UAD  Dispense: 100 each; Refill: 0  6. Need for shingles vaccine: - Administered today in office.  - Varicella-zoster vaccine IM    Patient was given clear instructions to go to Emergency Department or return to medical center if symptoms don't improve, worsen, or new problems develop.The patient verbalized understanding.  I discussed the assessment and treatment plan with the patient. The patient was provided an opportunity to ask questions and all were answered. The patient agreed with the plan and demonstrated an understanding of the instructions.   The patient was advised to call back or seek an in-person evaluation if the symptoms worsen or if the condition fails to improve as anticipated.    Durene Fruits, NP 07/05/2021, 12:25 PM Primary Care at [facility]

## 2021-07-05 ENCOUNTER — Other Ambulatory Visit: Payer: Self-pay

## 2021-07-05 ENCOUNTER — Encounter: Payer: Self-pay | Admitting: Family

## 2021-07-05 ENCOUNTER — Ambulatory Visit (INDEPENDENT_AMBULATORY_CARE_PROVIDER_SITE_OTHER): Payer: Medicare Other | Admitting: Family

## 2021-07-05 VITALS — BP 120/89 | HR 75 | Temp 98.2°F | Resp 15 | Ht 70.0 in | Wt 295.8 lb

## 2021-07-05 DIAGNOSIS — R079 Chest pain, unspecified: Secondary | ICD-10-CM

## 2021-07-05 DIAGNOSIS — G4733 Obstructive sleep apnea (adult) (pediatric): Secondary | ICD-10-CM

## 2021-07-05 DIAGNOSIS — Z23 Encounter for immunization: Secondary | ICD-10-CM | POA: Diagnosis not present

## 2021-07-05 DIAGNOSIS — Z7689 Persons encountering health services in other specified circumstances: Secondary | ICD-10-CM

## 2021-07-05 DIAGNOSIS — Z6841 Body Mass Index (BMI) 40.0 and over, adult: Secondary | ICD-10-CM

## 2021-07-05 MED ORDER — PEN NEEDLES 31G X 8 MM MISC: 0 refills | Status: DC

## 2021-07-05 MED ORDER — SEMAGLUTIDE (1 MG/DOSE) 4 MG/3ML ~~LOC~~ SOPN: 0.2500 mg | PEN_INJECTOR | SUBCUTANEOUS | 0 refills | Status: AC

## 2021-07-05 NOTE — Progress Notes (Signed)
Pt presents to establish care and ER f/u  Desires 2nd dose Shingles vaccine

## 2021-07-05 NOTE — Patient Instructions (Signed)
Thank you for choosing Primary Care at [facility] for your medical home!    Michael [name] was seen by [name], NP today.   Christy Sartorius Danish's primary care provider is Anibal Quinby Zachery Dauer, NP.   For the best care possible,  you should try to see Durene Fruits, NP whenever you come to clinic.   We look forward to seeing you again soon!  If you have any questions about your visit today,  please call us at [phone]  Or feel free to reach your provider via [place].    Keeping you healthy   Get these tests Blood pressure- Have your blood pressure checked once a year by your healthcare provider.  Normal blood pressure is 120/80. Weight- Have your body mass index (BMI) calculated to screen for obesity.  BMI is a measure of body fat based on height and weight. You can also calculate your own BMI at [employer]velB[employer].it. Cholesterol- Have your cholesterol checked regularly starting at age 75, sooner may be necessary if you have diabetes, high blood pressure, if a family member developed heart diseases at an early age or if you smoke.  Chlamydia, HIV, and other sexual transmitted disease- Get screened each year until the age of 21 then within three months of each new sexual partner. Diabetes- Have your blood sugar checked regularly if you have high blood pressure, high cholesterol, a family history of diabetes or if you are overweight.   Get these vaccines Flu shot- Every fall. Tetanus shot- Every 10 years. Menactra- Single dose; prevents meningitis.   Take these steps Don't smoke- If you do smoke, ask your healthcare provider about quitting. For tips on how to quit, go to www.smokefree.gov or call 1-800-QUIT-NOW. Be physically active- Exercise 5 days a week for at least 30 minutes.  If you are not already physically active start slow and gradually work up to 30 minutes of moderate physical activity.  Examples of moderate activity include walking briskly, mowing the yard, dancing, swimming  bicycling, etc. Eat a healthy diet- Eat a variety of healthy foods such as fruits, vegetables, low fat milk, low fat cheese, yogurt, lean meats, poultry, fish, beans, tofu, etc.  For more information on healthy eating, go to www.thenutritionsource.org Drink alcohol in moderation- Limit alcohol intake two drinks or less a day.  Never drink and drive. Dentist- Brush and floss teeth twice daily; visit your dentis twice a year. Depression-Your emotional health is as important as your physical health.  If you're feeling down, losing interest in things you normally enjoy please talk with your healthcare provider. Gun Safety- If you keep a gun in your home, keep it unloaded and with the safety lock on.  Bullets should be stored separately. Helmet use- Always wear a helmet when riding a motorcycle, bicycle, rollerblading or skateboarding. Safe sex- If you may be exposed to a sexually transmitted infection, use a condom Seat belts- Seat bels can save your life; always wear one. Smoke/Carbon Monoxide detectors- These detectors need to be installed on the appropriate level of your home.  Replace batteries at least once a year. Skin Cancer- When out in the sun, cover up and use sunscreen SPF 15 or higher. Violence- If anyone is threatening or hurting you, please tell your healthcare provider.

## 2021-07-13 ENCOUNTER — Other Ambulatory Visit: Payer: Self-pay | Admitting: Registered Nurse

## 2021-07-13 ENCOUNTER — Other Ambulatory Visit: Payer: Self-pay | Admitting: Family Medicine

## 2021-07-13 DIAGNOSIS — I10 Essential (primary) hypertension: Secondary | ICD-10-CM

## 2021-07-18 ENCOUNTER — Other Ambulatory Visit: Payer: Self-pay | Admitting: Family Medicine

## 2021-08-12 ENCOUNTER — Other Ambulatory Visit: Payer: Self-pay

## 2021-08-12 ENCOUNTER — Ambulatory Visit (HOSPITAL_COMMUNITY)
Admission: EM | Admit: 2021-08-12 | Discharge: 2021-08-12 | Disposition: A | Discharge status: Home / Self Care | Payer: Medicare Other | Attending: Sports Medicine | Admitting: Sports Medicine

## 2021-08-12 ENCOUNTER — Encounter (HOSPITAL_COMMUNITY): Payer: Self-pay

## 2021-08-12 DIAGNOSIS — M545 Low back pain, unspecified: Secondary | ICD-10-CM | POA: Insufficient documentation

## 2021-08-12 DIAGNOSIS — Z113 Encounter for screening for infections with a predominantly sexual mode of transmission: Secondary | ICD-10-CM | POA: Insufficient documentation

## 2021-08-12 DIAGNOSIS — G8929 Other chronic pain: Secondary | ICD-10-CM | POA: Insufficient documentation

## 2021-08-12 DIAGNOSIS — D239 Other benign neoplasm of skin, unspecified: Secondary | ICD-10-CM | POA: Insufficient documentation

## 2021-08-12 DIAGNOSIS — M5136 Other intervertebral disc degeneration, lumbar region: Secondary | ICD-10-CM

## 2021-08-12 DIAGNOSIS — M5126 Other intervertebral disc displacement, lumbar region: Secondary | ICD-10-CM | POA: Diagnosis present

## 2021-08-12 MED ORDER — KETOROLAC TROMETHAMINE 60 MG/2ML IM SOLN
30.0000 mg | Freq: Once | INTRAMUSCULAR | Status: AC
  Administered 2021-08-12: 30 mg via INTRAMUSCULAR

## 2021-08-12 MED ORDER — KETOROLAC TROMETHAMINE 30 MG/ML IJ SOLN
INTRAMUSCULAR | Status: AC
  Filled 2021-08-12: qty 1

## 2021-08-12 NOTE — ED Triage Notes (Signed)
Pt requesting to be tested for all STDs (denies symptoms), and c/o lower back pain.

## 2021-08-12 NOTE — Discharge Instructions (Addendum)
Will call with blood work testing, released to [employer]  Follow-up with Dr. Junius Roads for chronic low back pain, Toradol IM injection today

## 2021-08-12 NOTE — ED Provider Notes (Signed)
Underwent [facility]    CSN: [id number] Arrival date & time: 08/12/21  1807      History   Chief Complaint Chief Complaint  Patient presents with   Back Pain   Exposure to STD    HPI Michael [name] is a 50 y.o. male here for chronic LBP and evaluation for STI screening.  Back pain has bothered him for a few years. He follows with Dr. Junius Roads for this. Had MRI of lumbar spine in April 2022, which showed:  1. At L5-S1 there is broad-based disc bulge with superimposed left subarticular/foraminal disc protrusion which results in moderate to severe left subarticular recess stenosis with suspected impingement of the descending left S1 nerve root. Moderate left and mild right foraminal stenosis and mild to moderate canal stenosis at this level. 2. Moderate canal and bilateral lateral recess stenosis at the inferior L4-L5 level and at the L5 vertebral body levels secondary to prominent ventral epidural fat and degenerative change. Mild left foraminal stenosis at L4-L5   Taking Ibuprofen and Muscle relaxer for this.   Patient is also here for requested STD/STI testing today. He recently had a partner testing and her results have not returned yet.  He does admit to unprotected sexual intercourse at times.  He denies any penile discharge, dysuria or any symptoms of infection.  He will request testing today for clarity.  He does have some black bumps on his scrotum that have been there over the last 1-2 years, he is following up with his PCP for this.  Past Medical History:  Diagnosis Date   Anxiety    Depression    Questionable per records. Not on any medication.    History of kidney stones    Hx of echocardiogram    a. Echo 12/13/12: Mild LVH, EF 99991111, grade 1 diastolic dysfunction, mild LAE, mild RVE, mild RAE   Hypertension    Insomnia    Migraine    Noncompliance    OSA (obstructive sleep apnea)    a. Sleep Study 02/2013:  mod OSA, AHI 33 per hour, O2 sat nadir 85%     Patient Active Problem List   Diagnosis Date Noted   CHF (congestive heart failure), NYHA class I, acute, diastolic ([place]) A999333   Central sleep apnea comorbid with prescribed opioid use 09/21/2019   History of uncontrolled hypertension 09/21/2019   Complex sleep apnea syndrome 08/18/2019   Claustrophobia 08/18/2019   Anxiety associated with depression 08/18/2019   Sleep disturbance 10/01/2018   Low HDL (under 40) 05/11/2017   Moderate major depression ([place]) 05/11/2017   Hypokalemia 04/04/2017   Morbid obesity ([place]) 04/04/2017   Chest wall pain 04/04/2017   Asthma 02/13/2016   Non compliance with medical treatment 06/13/2015   OSA (obstructive sleep apnea) 04/06/2013   Abnormal EKG 11/08/2012   Essential hypertension    HTN (hypertension), malignant 04/29/2012   Migraine headache with aura 04/29/2012    Past Surgical History:  Procedure Laterality Date   Admission  04/2012   Malignant HTN, HA.  CT head negative, cardiology consult.     CYSTOSCOPY WITH RETROGRADE PYELOGRAM, URETEROSCOPY AND STENT PLACEMENT Left 01/24/2019   Procedure: CYSTOSCOPY WITH RETROGRADE PYELOGRAM, URETEROSCOPY AND STENT PLACEMENT;  Surgeon: Cleon Gustin, MD;  Location: [facility];  Service: Urology;  Laterality: Left;   HOLMIUM LASER APPLICATION Left [id number]   Procedure: HOLMIUM LASER APPLICATION;  Surgeon: Cleon Gustin, MD;  Location: [facility];  Service: Urology;  Laterality:  Left;   MANDIBLE SURGERY  1998   metal plate       Home Medications    Prior to Admission medications   Medication Sig Start Date End Date Taking? Authorizing Provider  amLODipine (NORVASC) 10 MG tablet Take 1 tablet (10 mg total) by mouth daily. 02/27/21  Yes [name], MD  buPROPion (WELLBUTRIN XL) 300 MG 24 hr tablet Take 1 tablet (300 mg total) by mouth daily. 02/27/21  Yes [name], MD  carvedilol (COREG) 6.25 MG tablet Take 1 tablet (6.25 mg total) by  mouth 2 (two) times daily. 04/22/21  Yes [name], MD  propranolol ER (INDERAL LA) 80 MG 24 hr capsule Take 1 capsule (80 mg total) by mouth daily. 05/22/21  Yes Maximiano Coss, NP  tamsulosin (FLOMAX) 0.4 MG CAPS capsule Take 0.4 mg by mouth at bedtime.   Yes [provider]  acetaminophen (TYLENOL) 325 MG tablet Take 650 mg by mouth every 6 (six) hours as needed for mild pain or headache.    [provider]  baclofen (LIORESAL) 10 MG tablet Take 10 mg by mouth 3 (three) times daily as needed for muscle spasms. 03/21/21   [provider]  celecoxib (CELEBREX) 200 MG capsule Take 1 capsule (200 mg total) by mouth 2 (two) times daily as needed. Patient taking differently: Take 200 mg by mouth 2 (two) times daily as needed for moderate pain. 12/27/20   Hilts, Legrand Como, MD  chlorthalidone (HYGROTON) 50 MG tablet Take 1 tablet by mouth every day Patient taking differently: Take 50 mg by mouth daily. 02/13/21   Maximiano Coss, NP  gabapentin (NEURONTIN) 100 MG capsule 1 PO q HS, may increase to 1 PO TID if needed Patient taking differently: Take 100 mg by mouth at bedtime. 1 PO q HS, may increase to 1 PO TID if needed 04/08/21   Hilts, Michael, MD  Insulin Pen Needle (PEN NEEDLES) 31G X 8 MM MISC UAD 07/05/21   [name], NP  lisinopril (ZESTRIL) 40 MG tablet Take 1 tablet by mouth every day Patient taking differently: Take 40 mg by mouth daily. 02/13/21   Maximiano Coss, NP  potassium chloride (KLOR-CON) 10 MEQ tablet Take 1 tablet by mouth every day Patient taking differently: Take 10 mEq by mouth daily. 09/16/20   [name], MD  QUEtiapine (SEROQUEL) 50 MG tablet Take 1 tablet (50 mg total) by mouth at bedtime. Patient taking differently: Take 50 mg by mouth daily. 05/22/21   Maximiano Coss, NP  tiZANidine (ZANAFLEX) 2 MG tablet TAKE 1 TO 2 TABLETS BY MOUTH EVERY 6 HOURS AS NEEDED FOR MUSCLE SPASM Patient not taking: No sig reported 06/05/21   Hilts, Michael,  MD  valACYclovir (VALTREX) 1000 MG tablet Take 1 tablet (1,000 mg total) by mouth 2 (two) times daily. 03/20/21   Maximiano Coss, NP    Family History Family History  Problem Relation Age of Onset   Cancer Father        prostate, colon- age was in his 4's   Heart disease Father 84       MI    Hypertension Father    Colon cancer Father    Diabetes Mother    Hypertension Mother    Hypertension Brother    Cancer Maternal Grandfather    Cancer Paternal Grandfather    Esophageal cancer Neg Hx    Stomach cancer Neg Hx    Rectal cancer Neg Hx     Social  History Social History   Tobacco Use   Smoking status: Never   Smokeless tobacco: Never  Vaping Use   Vaping Use: Never used  Substance Use Topics   Alcohol use: No    Alcohol/week: 0.0 standard drinks   Drug use: Yes    Types: Marijuana     Allergies   Statins   Review of Systems Review of Systems  Constitutional:  Negative for chills and fever.  Genitourinary:  Negative for difficulty urinating, dysuria, hematuria, penile discharge and testicular pain.       + reports black bumps on scrotum  Musculoskeletal:  Positive for back pain (chronic LBP). Negative for gait problem.  Skin:  Negative for rash.  Neurological:  Negative for dizziness.    Physical Exam Triage Vital Signs ED Triage Vitals  Enc Vitals Group     BP 08/12/21 1907 134/84     Pulse Rate 08/12/21 1907 84     Resp 08/12/21 1907 20     Temp 08/12/21 1907 98.4 F (36.9 C)     Temp Source 08/12/21 1907 Oral     SpO2 08/12/21 1907 97 %     Weight --      Height --      Head Circumference --      Peak Flow --      Pain Score 08/12/21 1905 10     Pain Loc --      Pain Edu? --      Excl. in [place]? --    No data found.  Updated Vital Signs BP 134/84 (BP Location: Left Arm)   Pulse 84   Temp 98.4 F (36.9 C) (Oral)   Resp 20   SpO2 97%    Physical Exam Constitutional:      General: He is not in acute distress.    Appearance: Normal  appearance. He is not ill-appearing.  HENT:     Head: Normocephalic and atraumatic.     Nose: Nose normal.  Eyes:     Extraocular Movements: Extraocular movements intact.     Pupils: Pupils are equal, round, and reactive to light.  Cardiovascular:     Rate and Rhythm: Normal rate.  Genitourinary:    Penis: Normal.      Comments: Exam performed with chaperone in room  + small, dark brown/black nodules scattered over b/l scrotum, non-painful, no erythema Musculoskeletal:     Cervical back: Normal range of motion and neck supple.  Skin:    General: Skin is warm.     Capillary Refill: Capillary refill takes less than 2 seconds.  Neurological:     Mental Status: He is alert.  Psychiatric:        Mood and Affect: Mood normal.        Thought Content: Thought content normal.   Lumbar spine: No gross deformity or scoliosis; no swelling or ecchymosis, no skin overlying changes.  No significant tenderness to palpation over spinous processes; there is left-sided paraspinal hypertonicity and tenderness to palpation.  There is a loss of lumbar lordosis. there is full active range of motion of the lumbar spine in flexion extension without notable pain./5 lower extremity and L4-S1 nerve root distribution.  Sensation to light touch intact throughout dorsal and plantar aspects of the lower extremity.  Negative straight leg raise, negative modified slump test.   UC Treatments / Results  Labs (all labs ordered are listed, but only abnormal results are displayed) Labs Reviewed  RPR  HIV ANTIBODY (ROUTINE TESTING  W REFLEX)    EKG   Radiology No results found.  Procedures Procedures (including critical care time)  Medications Ordered in UC Medications  ketorolac (TORADOL) injection 30 mg (30 mg Intramuscular Given 08/12/21 2012)    Initial Impression / Assessment and Plan / UC Course  I have reviewed the triage vital signs and the nursing notes.  Pertinent labs & imaging results that  were available during my care of the patient were reviewed by me and considered in my medical decision making (see chart for details).     Chronic low back pain, in setting of previous L5-S1 disc bulge and forminal stenosis of L4-L5. No new injury. No red flag symptoms today.  Toradol IM 30 mg given today pain relief.  Patient is to follow-up with his sports medicine physician, Dr. Junius Roads for routine back-care.  Angiokeratoma of Ford[place]ce Desire for STI screening -patient is asymptomatic without penile pain or discharge.  He does desire HIV, syphilis testing today.  HIV and RPR were sent, these will return to his MyChart we will call with results.  Encourage safe sex practices.  Patient stable for discharge home. Final Clinical Impressions(s) / UC Diagnoses   Final diagnoses:  Chronic bilateral low back pain, unspecified whether sciatica present  Bulging of lumbar intervertebral disc  Fordyce angiokeratoma  Screening for STD (sexually transmitted disease)     Discharge Instructions      Will call with blood work testing, released to [employer]  Follow-up with Dr. Junius Roads for chronic low back pain, Toradol IM injection today     ED Prescriptions   None    PDMP not reviewed this encounter.   Elba Barman, DO 08/12/21 2037

## 2021-08-13 ENCOUNTER — Ambulatory Visit (INDEPENDENT_AMBULATORY_CARE_PROVIDER_SITE_OTHER): Payer: Medicare Other | Admitting: Family Medicine

## 2021-08-13 ENCOUNTER — Encounter: Payer: Self-pay | Admitting: Family Medicine

## 2021-08-13 DIAGNOSIS — M5126 Other intervertebral disc displacement, lumbar region: Secondary | ICD-10-CM | POA: Diagnosis not present

## 2021-08-13 LAB — HIV ANTIBODY (ROUTINE TESTING W REFLEX): HIV Screen 4th Generation wRfx: NONREACTIVE

## 2021-08-13 LAB — RPR: RPR Ser Ql: NONREACTIVE

## 2021-08-13 NOTE — Progress Notes (Signed)
Office Visit Note   Patient: Michael [name]           Date of Birth: 23-Feb-1971           MRN: [id number] Visit Date: 08/13/2021 Requested by: [name], NP [place],  [place] [address] PCP: [name], NP  Subjective: Chief Complaint  Patient presents with   Lower Back - Follow-up, Pain    Went to [facility] UC yesterday for the back pain. Has had 2 ESIs with Dr. Ernestina Patches. The first one did not help. The 2nd one did. The pain did start again though. He has been trying stretches, but the pain continues.    HPI: He is here with flareup of low back pain.  History of L5-S1 disc herniation.  He has had 2 epidural injections, the second 1 helped a lot.  He was not pain-free but it was much more manageable.  Recently it flared up again and he went to urgent care.  He denies bowel or bladder dysfunction.  The pain is primarily into the left posterior hip.                ROS:   All other systems were reviewed and are negative.  Objective: Vital Signs: There were no vitals taken for this visit.  Physical Exam:  General:  Alert and oriented, in no acute distress. Pulm:  Breathing unlabored. Psy:  Normal mood, congruent affect.  Low back: He is tender to palpation over the L5-S1 level and in the left sciatic notch.  Negative straight leg raise, lower extremity strength and reflexes remain normal.    Imaging: No results found.  Assessment & Plan: Flareup of low back pain with history of L5-S1 disc herniation -He would like to try 1 more injection.  If this does not provide lasting relief, then he wants to consult with a surgeon.     Procedures: No procedures performed        PMFS History: Patient Active Problem List   Diagnosis Date Noted   CHF (congestive heart failure), NYHA class I, acute, diastolic ([place]) A999333   Central sleep apnea comorbid with prescribed opioid use 09/21/2019   History of uncontrolled hypertension 09/21/2019   Complex  sleep apnea syndrome 08/18/2019   Claustrophobia 08/18/2019   Anxiety associated with depression 08/18/2019   Sleep disturbance 10/01/2018   Low HDL (under 40) 05/11/2017   Moderate major depression ([place]) 05/11/2017   Hypokalemia 04/04/2017   Morbid obesity ([place]) 04/04/2017   Chest wall pain 04/04/2017   Asthma 02/13/2016   Non compliance with medical treatment 06/13/2015   OSA (obstructive sleep apnea) 04/06/2013   Abnormal EKG 11/08/2012   Essential hypertension    HTN (hypertension), malignant 04/29/2012   Migraine headache with aura 04/29/2012   Past Medical History:  Diagnosis Date   Anxiety    Depression    Questionable per records. Not on any medication.    History of kidney stones    Hx of echocardiogram    a. Echo 12/13/12: Mild LVH, EF 99991111, grade 1 diastolic dysfunction, mild LAE, mild RVE, mild RAE   Hypertension    Insomnia    Migraine    Noncompliance    OSA (obstructive sleep apnea)    a. Sleep Study 02/2013:  mod OSA, AHI 33 per hour, O2 sat nadir 85%    Family History  Problem Relation Age of Onset   Cancer Father        prostate,  colon- age was in his 64's   Heart disease Father 64       MI    Hypertension Father    Colon cancer Father    Diabetes Mother    Hypertension Mother    Hypertension Brother    Cancer Maternal Grandfather    Cancer Paternal Grandfather    Esophageal cancer Neg Hx    Stomach cancer Neg Hx    Rectal cancer Neg Hx     Past Surgical History:  Procedure Laterality Date   Admission  04/2012   Malignant HTN, HA.  CT head negative, cardiology consult.     CYSTOSCOPY WITH RETROGRADE PYELOGRAM, URETEROSCOPY AND STENT PLACEMENT Left 01/24/2019   Procedure: CYSTOSCOPY WITH RETROGRADE PYELOGRAM, URETEROSCOPY AND STENT PLACEMENT;  Surgeon: Cleon Gustin, MD;  Location: [facility];  Service: Urology;  Laterality: Left;   HOLMIUM LASER APPLICATION Left [id number]   Procedure: HOLMIUM LASER APPLICATION;  Surgeon:  Cleon Gustin, MD;  Location: [facility];  Service: Urology;  Laterality: Left;   MANDIBLE SURGERY  1998   metal plate   Social History   Occupational History   Occupation: Unemployed//disabled  Tobacco Use   Smoking status: Never   Smokeless tobacco: Never  Vaping Use   Vaping Use: Never used  Substance and Sexual Activity   Alcohol use: No    Alcohol/week: 0.0 standard drinks   Drug use: Yes    Types: Marijuana   Sexual activity: Yes    Comment: per pt's blue health form - 1 sex partner in last 12 months

## 2021-08-17 ENCOUNTER — Other Ambulatory Visit: Payer: Self-pay | Admitting: Family

## 2021-08-22 NOTE — Progress Notes (Signed)
Virtual Visit via Telephone Note  I connected with Michael [name], on 08/26/2021 at 2:04 PM by telephone due to the COVID-19 pandemic and verified that I am speaking with the correct person using two identifiers.  Due to current restrictions/limitations of in-office visits due to the COVID-19 pandemic, this scheduled clinical appointment was converted to a telehealth visit.   Consent: I discussed the limitations, risks, security and privacy concerns of performing an evaluation and management service by telephone and the availability of in person appointments. I also discussed with the patient that there may be a patient responsible charge related to this service. The patient expressed understanding and agreed to proceed.   Location of Patient: Home  Location of Provider: [facility] [place] participating in Telemedicine visit: Michael [name] Minette Brine, NP [place], [place]   History of Present Illness: [name] is a 50 year-old male who presents for weight management follow-up.  WEIGHT MANAGEMENT FOLLOW-UP: 07/05/2021: - Begin Semaglutide as prescribed.   08/26/2021: Currently 280 pounds. Baking foods. No sodas. Occasional coffee. Exercise when able to do so as he has back pain, taking steroid injections. Since last visit unable to pick up Semaglutide as pharmacy did not call to let him know it was ready.    Past Medical History:  Diagnosis Date   Anxiety    Depression    Questionable per records. Not on any medication.    History of kidney stones    Hx of echocardiogram    a. Echo 12/13/12: Mild LVH, EF 99991111, grade 1 diastolic dysfunction, mild LAE, mild RVE, mild RAE   Hypertension    Insomnia    Migraine    Noncompliance    OSA (obstructive sleep apnea)    a. Sleep Study 02/2013:  mod OSA, AHI 33 per hour, O2 sat nadir 85%   Allergies  Allergen Reactions   Statins Other (See Comments)    unknown    Current Outpatient  Medications on File Prior to Visit  Medication Sig Dispense Refill   acetaminophen (TYLENOL) 325 MG tablet Take 650 mg by mouth every 6 (six) hours as needed for mild pain or headache. (Patient not taking: Reported on 08/13/2021)     amLODipine (NORVASC) 10 MG tablet Take 1 tablet (10 mg total) by mouth daily. 90 tablet 1   buPROPion (WELLBUTRIN XL) 300 MG 24 hr tablet Take 1 tablet (300 mg total) by mouth daily. 90 tablet 1   carvedilol (COREG) 6.25 MG tablet Take 1 tablet (6.25 mg total) by mouth 2 (two) times daily. 60 tablet 2   celecoxib (CELEBREX) 200 MG capsule Take 1 capsule (200 mg total) by mouth 2 (two) times daily as needed. (Patient taking differently: Take 200 mg by mouth 2 (two) times daily as needed for moderate pain.) 60 capsule 6   chlorthalidone (HYGROTON) 50 MG tablet Take 1 tablet by mouth every day (Patient taking differently: Take 50 mg by mouth daily.) 90 tablet 1   gabapentin (NEURONTIN) 100 MG capsule 1 PO q HS, may increase to 1 PO TID if needed (Patient taking differently: Take 100 mg by mouth at bedtime. 1 PO q HS, may increase to 1 PO TID if needed) 90 capsule 3   Insulin Pen Needle (PEN NEEDLES) 31G X 8 MM MISC UAD 100 each 0   lisinopril (ZESTRIL) 40 MG tablet Take 1 tablet by mouth every day (Patient taking differently: Take 40 mg by mouth daily.) 90 tablet 1  potassium chloride (KLOR-CON) 10 MEQ tablet Take 1 tablet by mouth every day (Patient taking differently: Take 10 mEq by mouth daily.) 90 tablet 3   propranolol ER (INDERAL LA) 80 MG 24 hr capsule Take 1 capsule (80 mg total) by mouth daily. 90 capsule 0   QUEtiapine (SEROQUEL) 50 MG tablet Take 1 tablet (50 mg total) by mouth at bedtime. (Patient taking differently: Take 50 mg by mouth daily.) 90 tablet 0   tamsulosin (FLOMAX) 0.4 MG CAPS capsule Take 0.4 mg by mouth at bedtime.     tiZANidine (ZANAFLEX) 2 MG tablet TAKE 1 TO 2 TABLETS BY MOUTH EVERY 6 HOURS AS NEEDED FOR MUSCLE SPASM (Patient not taking: No sig  reported) 60 tablet 0   valACYclovir (VALTREX) 1000 MG tablet Take 1 tablet (1,000 mg total) by mouth 2 (two) times daily. 14 tablet 6   Current Facility-Administered Medications on File Prior to Visit  Medication Dose Route Frequency Provider Last Rate Last Admin   0.9 %  sodium chloride infusion  500 mL Intravenous Continuous Ladene Artist, MD        Observations/Objective: Alert and oriented x 3. Not in acute distress. Physical examination not completed as this is a telemedicine visit.  Assessment and Plan: 1. Encounter for weight management: 2. Class 3 severe obesity with body mass index (BMI) of 40.0 to 44.9 in adult, unspecified obesity type, unspecified whether serious comorbidity present [facility]): - Since last visit patient lost 15 pounds primarily related to diet and intermittent exercise. Congratulated and encouraged to keep up the great work! - Since last visit unable to [employer] as pharmacy did not make him aware that medication was available for pickup. - Begin Semaglutide as prescribed.  - Follow-up with primary provider in 4 weeks or sooner if needed.  - Semaglutide,0.25 or 0.'5MG'$ /DOS, 2 MG/1.5ML SOPN; Inject 0.25 mg into the skin once a week.  Dispense: 1.5 mL; Refill: 0   Follow Up Instructions: Follow-up with primary provider in 4 weeks or sooner if needed.   Patient was given clear instructions to go to Emergency Department or return to medical center if symptoms don't improve, worsen, or new problems develop.The patient verbalized understanding.  I discussed the assessment and treatment plan with the patient. The patient was provided an opportunity to ask questions and all were answered. The patient agreed with the plan and demonstrated an understanding of the instructions.   The patient was advised to call back or seek an in-person evaluation if the symptoms worsen or if the condition fails to improve as anticipated.    I provided 10 minutes total of  non-face-to-face time during this encounter.   [name], NP  [facility] [place], [place] 08/26/2021, 2:04 PM

## 2021-08-23 ENCOUNTER — Other Ambulatory Visit: Payer: Self-pay | Admitting: Family

## 2021-08-23 DIAGNOSIS — I10 Essential (primary) hypertension: Secondary | ICD-10-CM

## 2021-08-26 ENCOUNTER — Other Ambulatory Visit: Payer: Self-pay

## 2021-08-26 ENCOUNTER — Telehealth (INDEPENDENT_AMBULATORY_CARE_PROVIDER_SITE_OTHER): Payer: Medicare Other | Admitting: Family

## 2021-08-26 DIAGNOSIS — Z7689 Persons encountering health services in other specified circumstances: Secondary | ICD-10-CM

## 2021-08-26 DIAGNOSIS — Z6841 Body Mass Index (BMI) 40.0 and over, adult: Secondary | ICD-10-CM

## 2021-08-26 MED ORDER — SEMAGLUTIDE(0.25 OR 0.5MG/DOS) 2 MG/1.5ML ~~LOC~~ SOPN: 0.2500 mg | PEN_INJECTOR | SUBCUTANEOUS | 0 refills | Status: AC

## 2021-08-27 ENCOUNTER — Telehealth: Payer: Self-pay | Admitting: Family

## 2021-08-27 ENCOUNTER — Other Ambulatory Visit: Payer: Self-pay

## 2021-08-27 DIAGNOSIS — I10 Essential (primary) hypertension: Secondary | ICD-10-CM

## 2021-08-27 MED ORDER — CARVEDILOL 6.25 MG PO TABS: 6.2500 mg | ORAL_TABLET | Freq: Two times a day (BID) | ORAL | 2 refills | Status: DC

## 2021-08-27 MED ORDER — AMLODIPINE BESYLATE 10 MG PO TABS: 10.0000 mg | ORAL_TABLET | Freq: Every day | ORAL | 1 refills | Status: AC

## 2021-08-27 NOTE — Telephone Encounter (Signed)
Courtney from [facility] called stating patient needs refills on his medications. Call back number is [phone], [phone]   carvedilol (COREG) 6.25 MG tablet   amLODipine (NORVASC) 10 MG      Pharmacy  [facility], [place] [address] [place] [address]  Phone:  [phone]  Fax:  [phone]

## 2021-08-28 ENCOUNTER — Ambulatory Visit: Payer: Self-pay

## 2021-08-28 ENCOUNTER — Ambulatory Visit (INDEPENDENT_AMBULATORY_CARE_PROVIDER_SITE_OTHER): Payer: Medicare Other | Admitting: Physical Medicine and Rehabilitation

## 2021-08-28 ENCOUNTER — Encounter: Payer: Self-pay | Admitting: Physical Medicine and Rehabilitation

## 2021-08-28 ENCOUNTER — Other Ambulatory Visit: Payer: Self-pay

## 2021-08-28 VITALS — BP 117/75 | HR 42

## 2021-08-28 DIAGNOSIS — M5416 Radiculopathy, lumbar region: Secondary | ICD-10-CM | POA: Diagnosis not present

## 2021-08-28 MED ORDER — BETAMETHASONE SOD PHOS & ACET 6 (3-3) MG/ML IJ SUSP
12.0000 mg | Freq: Once | INTRAMUSCULAR | Status: AC
  Administered 2021-08-28: 12 mg

## 2021-08-28 NOTE — Patient Instructions (Signed)

## 2021-08-28 NOTE — Progress Notes (Signed)
Pt state lower back pain that travels to his left side and leg. Pt state walking, standing and bending makes the pain worse. Pt state takes pain meds to help ease his pain. Pt has hx of inj on 05/23/21 pt state it helped for a little while.  Numeric Pain Rating Scale and Functional Assessment Average Pain 9   In the last MONTH (on 0-10 scale) has pain interfered with the following?  1. General activity like being  able to carry out your everyday physical activities such as walking, climbing stairs, carrying groceries, or moving a chair?  Rating(10)   +Driver, -BT, -Dye Allergies.

## 2021-09-07 ENCOUNTER — Telehealth: Payer: Self-pay | Admitting: Family

## 2021-09-07 NOTE — Telephone Encounter (Signed)
ERROR

## 2021-09-08 ENCOUNTER — Ambulatory Visit (INDEPENDENT_AMBULATORY_CARE_PROVIDER_SITE_OTHER): Payer: Medicare Other

## 2021-09-08 DIAGNOSIS — Z0000 Encounter for general adult medical examination without abnormal findings: Secondary | ICD-10-CM

## 2021-09-08 NOTE — Progress Notes (Signed)
Subjective:   Michael [name] is a 50 y.o. male who presents for Medicare Annual/Subsequent preventive examination.  I connected with  Michael [name] on 09/08/21 by a video enabled telemedicine application and verified that I am speaking with the correct person using two identifiers.   I discussed the limitations of evaluation and management by telemedicine. The patient expressed understanding and agreed to proceed.   Location of patient: Home Location of provide: office Persons participating in visit: Michael [name] (patient) and Michael [name], CMS/RT(R)BD)  Review of Systems    Defer to PCP        Objective:    There were no vitals filed for this visit. There is no height or weight on file to calculate BMI.  Advanced Directives 11/16/2020 05/26/2019 01/14/2019 04/03/2017 12/14/2016 04/30/2012  Does Patient Have a Medical Advance Directive? No No No No No Patient does not have advance directive;Patient would like information  Would patient like information on creating a medical advance directive? No - Patient declined No - Patient declined No - Patient declined No - Patient declined - Advance directive packet given    Current Medications (verified) Outpatient Encounter Medications as of 09/08/2021  Medication Sig   acetaminophen (TYLENOL) 325 MG tablet Take 650 mg by mouth every 6 (six) hours as needed for mild pain or headache.   amLODipine (NORVASC) 10 MG tablet Take 1 tablet (10 mg total) by mouth daily.   buPROPion (WELLBUTRIN XL) 300 MG 24 hr tablet Take 1 tablet (300 mg total) by mouth daily.   carvedilol (COREG) 6.25 MG tablet Take 1 tablet (6.25 mg total) by mouth 2 (two) times daily.   celecoxib (CELEBREX) 200 MG capsule Take 1 capsule (200 mg total) by mouth 2 (two) times daily as needed. (Patient taking differently: Take 200 mg by mouth 2 (two) times daily as needed for moderate pain.)   chlorthalidone (HYGROTON) 50 MG tablet Take 1 tablet by mouth every day   gabapentin (NEURONTIN)  100 MG capsule 1 PO q HS, may increase to 1 PO TID if needed (Patient taking differently: Take 100 mg by mouth at bedtime. 1 PO q HS, may increase to 1 PO TID if needed)   Insulin Pen Needle (PEN NEEDLES) 31G X 8 MM MISC UAD   lisinopril (ZESTRIL) 40 MG tablet Take 1 tablet by mouth every day   potassium chloride (KLOR-CON) 10 MEQ tablet Take 1 tablet by mouth every day   propranolol ER (INDERAL LA) 80 MG 24 hr capsule Take 1 capsule (80 mg total) by mouth daily.   QUEtiapine (SEROQUEL) 50 MG tablet Take 1 tablet (50 mg total) by mouth at bedtime. (Patient taking differently: Take 50 mg by mouth daily.)   Semaglutide,0.25 or 0.'5MG'$ /DOS, 2 MG/1.5ML SOPN Inject 0.25 mg into the skin once a week.   tamsulosin (FLOMAX) 0.4 MG CAPS capsule Take 0.4 mg by mouth at bedtime.   tiZANidine (ZANAFLEX) 2 MG tablet TAKE 1 TO 2 TABLETS BY MOUTH EVERY 6 HOURS AS NEEDED FOR MUSCLE SPASM   valACYclovir (VALTREX) 1000 MG tablet Take 1 tablet (1,000 mg total) by mouth 2 (two) times daily.   Facility-Administered Encounter Medications as of 09/08/2021  Medication   0.9 %  sodium chloride infusion   betamethasone acetate-betamethasone sodium phosphate (CELESTONE) injection 12 mg    Allergies (verified) Statins   History: Past Medical History:  Diagnosis Date   Anxiety    Depression    Questionable per records. Not on any medication.    History  of kidney stones    Hx of echocardiogram    a. Echo 12/13/12: Mild LVH, EF 99991111, grade 1 diastolic dysfunction, mild LAE, mild RVE, mild RAE   Hypertension    Insomnia    Migraine    Noncompliance    OSA (obstructive sleep apnea)    a. Sleep Study 02/2013:  mod OSA, AHI 33 per hour, O2 sat nadir 85%   Past Surgical History:  Procedure Laterality Date   Admission  04/2012   Malignant HTN, HA.  CT head negative, cardiology consult.     CYSTOSCOPY WITH RETROGRADE PYELOGRAM, URETEROSCOPY AND STENT PLACEMENT Left 01/24/2019   Procedure: CYSTOSCOPY WITH RETROGRADE  PYELOGRAM, URETEROSCOPY AND STENT PLACEMENT;  Surgeon: Cleon Gustin, MD;  Location: [facility];  Service: Urology;  Laterality: Left;   HOLMIUM LASER APPLICATION Left [id number]   Procedure: HOLMIUM LASER APPLICATION;  Surgeon: Cleon Gustin, MD;  Location: [facility];  Service: Urology;  Laterality: Left;   MANDIBLE SURGERY  1998   metal plate   Family History  Problem Relation Age of Onset   Cancer Father        prostate, colon- age was in his 37's   Heart disease Father 46       MI    Hypertension Father    Colon cancer Father    Diabetes Mother    Hypertension Mother    Hypertension Brother    Cancer Maternal Grandfather    Cancer Paternal Grandfather    Esophageal cancer Neg Hx    Stomach cancer Neg Hx    Rectal cancer Neg Hx    Social History   Socioeconomic History   Marital status: Divorced    Spouse name: Not on file   Number of children: 4   Years of education: Not on file   Highest education level: Not on file  Occupational History   Occupation: Unemployed//disabled  Tobacco Use   Smoking status: Never   Smokeless tobacco: Never  Vaping Use   Vaping Use: Never used  Substance and Sexual Activity   Alcohol use: No    Alcohol/week: 0.0 standard drinks   Drug use: Yes    Types: Marijuana   Sexual activity: Yes    Comment: per pt's blue health form - 1 sex partner in last 12 months  Other Topics Concern   Not on file  Social History Narrative   Marital status: single; living with fiance      Children: 4 children, no grandchildren.      Employment:  Disability for learning disabilities since 2012.   Right-handed   Caffeine: occasional         Social Determinants of Radio broadcast assistant Strain: High Risk   Difficulty of Paying Living Expenses: Very hard  Food Insecurity: No Food Insecurity   Worried About Charity fundraiser in the Last Year: Never true   Ran Out of Food in the Last Year: Never true   Transportation Needs: No Transportation Needs   Lack of Transportation (Medical): No   Lack of Transportation (Non-Medical): No  Physical Activity: Inactive   Days of Exercise per Week: 0 days   Minutes of Exercise per Session: 0 min  Stress: Stress Concern Present   Feeling of Stress : To some extent  Social Connections: Socially Isolated   Frequency of Communication with Friends and Family: More than three times a week   Frequency of Social Gatherings with Friends and Family: Once  a week   Attends Religious Services: Never   Active Member of Clubs or Organizations: No   Attends Music therapist: Never   Marital Status: Never married    Tobacco Counseling Non smoker   Clinical Intake:  Pre-visit preparation completed: Yes  Pain : No/denies pain        How often do you need to have someone help you when you read instructions, pamphlets, or other written materials from your doctor or pharmacy?: 1 - Never  Diabetic? No  Interpreter Needed?: No      Activities of Daily Living In your present state of health, do you have any difficulty performing the following activities: 09/08/2021  Hearing? N  Vision? Y  Comment blurry at times  Difficulty concentrating or making decisions? Y  Walking or climbing stairs? Y  Comment chronic back pain  Dressing or bathing? N  Doing errands, shopping? N  Some recent data might be hidden    Patient Care Team: [name], NP as PCP - General (Nurse Practitioner) Sueanne Margarita, MD as PCP - Cardiology (Cardiology) Sharmon Revere as Referring Physician (Physician Assistant)  Indicate any recent Medical Services you may have received from other than [facility] providers in the past year (date may be approximate).     Assessment:   This is a routine wellness examination for Elyjiah.  Hearing/Vision screen No results found.  Dietary issues and exercise activities discussed:  Patient is not currently as active  as he would like due to back pain.   Goals Addressed   None    Depression Screen PHQ 2/9 Scores 09/08/2021 09/08/2021 07/05/2021 03/20/2021 02/27/2021 01/03/2021 11/26/2020  PHQ - 2 Score 1 1 0 0 3 0 0  PHQ- 9 Score 10 - 0 - 16 - -    Fall Risk Fall Risk  09/08/2021 03/20/2021 02/27/2021 01/03/2021 11/26/2020  Falls in the past year? 0 0 0 0 0  Number falls in past yr: 0 0 - - 0  Injury with Fall? 0 0 - - 0  Follow up Falls evaluation completed - Falls evaluation completed Falls evaluation completed Falls evaluation completed    [place]:  Any stairs in or around the home? Yes  If so, are there any without handrails? Yes  Home free of loose throw rugs in walkways, pet beds, electrical cords, etc? No  Adequate lighting in your home to reduce risk of falls? No   ASSISTIVE DEVICES UTILIZED TO PREVENT FALLS:  Life alert? No  Use of a cane, walker or w/c? No  Grab bars in the bathroom? No  Shower chair or bench in shower? No  Elevated toilet seat or a handicapped toilet? No   TIMED UP AND GO:  Was the test performed?  N/A .  Length of time to ambulate 10 feet: N/A sec.    These questions cannot be answered via Telephone Encounter.    Cognitive Function:     6CIT Screen 09/08/2021  What Year? 0 points  What month? 0 points  What time? 0 points  Count back from 20 0 points  Months in reverse 0 points  Repeat phrase 6 points  Total Score 6    Immunizations Immunization History  Administered Date(s) Administered   Influenza Split 10/23/2012   Influenza,inj,Quad PF,6+ Mos 09/22/2013, 10/03/2015, 10/02/2020   Influenza,inj,Quad PF,6-35 Mos 12/31/2017   Influenza,inj,quad, With Preservative 09/22/2013, 10/03/2015   PFIZER(Purple Top)SARS-COV-2 Vaccination 03/15/2020, 04/09/2020   Tdap  12/30/2011   Zoster Recombinat (Shingrix) 10/02/2020, 07/05/2021    TDAP status: Up to date  Flu Vaccine status: Due, Education has been provided regarding the  importance of this vaccine. Advised may receive this vaccine at local pharmacy or Health Dept. Aware to provide a copy of the vaccination record if obtained from local pharmacy or Health Dept. Verbalized acceptance and understanding.  Pneumococcal Vaccine: Aged out    Covid-19 vaccine status: Information provided on how to obtain vaccines.   Qualifies for Shingles Vaccine? Yes   Zostavax completed Yes   Shingrix Completed?: Yes  Screening Tests Health Maintenance  Topic Date Due   COVID-19 Vaccine (3 - Booster for Pfizer series) 09/09/2020   INFLUENZA VACCINE  07/29/2021   TETANUS/TDAP  12/29/2021   COLONOSCOPY (Pts 45-13yr Insurance coverage will need to be confirmed)  06/28/2025   Hepatitis C Screening  Completed   HIV Screening  Completed   Zoster Vaccines- Shingrix  Completed   Pneumococcal Vaccine 08065Years old  Aged Out   [place]PV [place] Due  Topic Date Due   COVID-19 Vaccine (3 - Booster for Pfizer series) 09/09/2020   INFLUENZA VACCINE  07/29/2021      Lung Cancer Screening: (Low Dose CT Chest recommended if Age 882-80years, 30 pack-year currently smoking OR have quit w/in 15years.) does not qualify.   Lung Cancer Screening Referral: N/A  Additional Screening:  Hepatitis C Screening: does qualify; Completed 07/13/2020  Vision Screening: Recommended annual ophthalmology exams for early detection of glaucoma and other disorders of the eye. Is the patient up to date with their annual eye exam?  No due to cost  Who is the provider or what is the name of the office in which the patient attends annual eye exams? [email or web address] is not established with a provider, would they like to be referred to a provider to establish care? No .   Dental Screening: Recommended annual dental exams for proper oral hygiene  Community Resource Referral / Chronic Care Management: CRR required this visit?  No   CCM required this  visit?  Yes      Plan:     I have personally reviewed and noted the following in the patient's chart:   Medical and social history Use of alcohol, tobacco or illicit drugs  Current medications and supplements including opioid prescriptions. Patient is not currently taking opioid prescriptions. Functional ability and status Nutritional status Physical activity Advanced directives List of other physicians Hospitalizations, surgeries, and ER visits in previous 12 months Vitals Screenings to include cognitive, depression, and falls Referrals and appointments  In addition, I have reviewed and discussed with patient certain preventive protocols, quality metrics, and best practice recommendations. A written personalized care plan for preventive services as well as general preventive health recommendations were provided to patient.     MJerilee Field RT   09/08/2021   Nurse Notes: Non face to face 30 min visit. [place]

## 2021-09-09 ENCOUNTER — Telehealth: Payer: Self-pay

## 2021-09-09 NOTE — Telephone Encounter (Signed)
Patient had AWV by phone yesterday and stated that he continues to have urinary problems - difficulty with urination and pain with urination at times - patient is taking Flomax daily but states that is not helping.  Patient also states that he is not able to take the tizanidine for his chronic back pain as it makes him sleepy and interferes with daily activity.  Please review and advise.

## 2021-09-10 NOTE — Telephone Encounter (Signed)
Called patient at [phone]. Unable to lvm for patient.

## 2021-09-14 NOTE — Procedures (Signed)
Lumbar Epidural Steroid Injection - Interlaminar Approach with Fluoroscopic Guidance  Patient: Michael [name]      Date of Birth: 1971/06/27 MRN: [id number] PCP: [name], NP      Visit Date: 08/28/2021   Universal Protocol:     Consent Given By: the patient  Position: PRONE  Additional Comments: Vital signs were monitored before and after the procedure. Patient was prepped and draped in the usual sterile fashion. The correct patient, procedure, and site was verified.   Injection Procedure Details:   Procedure diagnoses: Lumbar radiculopathy [M54.16]   Meds Administered:  Meds ordered this encounter  Medications   betamethasone acetate-betamethasone sodium phosphate (CELESTONE) injection 12 mg     Laterality: Left  Location/Site:  L5-S1  Needle: 3.5 in., 20 ga. Tuohy  Needle Placement: Paramedian epidural  Findings:   -Comments: Excellent flow of contrast into the epidural space.  Procedure Details: Using a paramedian approach from the side mentioned above, the region overlying the inferior lamina was localized under fluoroscopic visualization and the soft tissues overlying this structure were infiltrated with 4 ml. of 1% Lidocaine without Epinephrine. The Tuohy needle was inserted into the epidural space using a paramedian approach.   The epidural space was localized using loss of resistance along with counter oblique bi-planar fluoroscopic views.  After negative aspirate for air, blood, and CSF, a 2 ml. volume of Isovue-250 was injected into the epidural space and the flow of contrast was observed. Radiographs were obtained for documentation purposes.    The injectate was administered into the level noted above.   Additional Comments:  The patient tolerated the procedure well Dressing: 2 x 2 sterile gauze and Band-Aid    Post-procedure details: Patient was observed during the procedure. Post-procedure instructions were reviewed.  Patient left the clinic  in stable condition.

## 2021-09-14 NOTE — Progress Notes (Signed)
Michael [name] - 50 y.o. male MRN [id number]  Date of birth: [birth year]  Office Visit Note: Visit Date: 08/28/2021 PCP: [name], NP Referred by: [name], NP  Subjective: Chief Complaint  Patient presents with   Lower Back - Pain   Left Leg - Pain   HPI:  Michael [name] is a 50 y.o. male who comes in today at the request of Dr. Eunice Blase for planned Left L5-S1 Lumbar Interlaminar epidural steroid injection with fluoroscopic guidance.  The patient has failed conservative care including home exercise, medications, time and activity modification.  This injection will be diagnostic and hopefully therapeutic.  Please see requesting physician notes for further details and justification.   ROS Otherwise per HPI.  Assessment & Plan: Visit Diagnoses:    ICD-10-CM   1. Lumbar radiculopathy  M54.16 XR C-ARM NO REPORT    Epidural Steroid injection    betamethasone acetate-betamethasone sodium phosphate (CELESTONE) injection 12 mg      Plan: No additional findings.   Meds & Orders:  Meds ordered this encounter  Medications   betamethasone acetate-betamethasone sodium phosphate (CELESTONE) injection 12 mg    Orders Placed This Encounter  Procedures   XR C-ARM NO REPORT   Epidural Steroid injection    Follow-up: Return if symptoms worsen or fail to improve.   Procedures: No procedures performed  Lumbar Epidural Steroid Injection - Interlaminar Approach with Fluoroscopic Guidance  Patient: Michael [name]      Date of Birth: Jan 15, 1971 MRN: [id number] PCP: [name], NP      Visit Date: 08/28/2021   Universal Protocol:     Consent Given By: the patient  Position: PRONE  Additional Comments: Vital signs were monitored before and after the procedure. Patient was prepped and draped in the usual sterile fashion. The correct patient, procedure, and site was verified.   Injection Procedure Details:   Procedure diagnoses: Lumbar radiculopathy [M54.16]   Meds  Administered:  Meds ordered this encounter  Medications   betamethasone acetate-betamethasone sodium phosphate (CELESTONE) injection 12 mg     Laterality: Left  Location/Site:  L5-S1  Needle: 3.5 in., 20 ga. Tuohy  Needle Placement: Paramedian epidural  Findings:   -Comments: Excellent flow of contrast into the epidural space.  Procedure Details: Using a paramedian approach from the side mentioned above, the region overlying the inferior lamina was localized under fluoroscopic visualization and the soft tissues overlying this structure were infiltrated with 4 ml. of 1% Lidocaine without Epinephrine. The Tuohy needle was inserted into the epidural space using a paramedian approach.   The epidural space was localized using loss of resistance along with counter oblique bi-planar fluoroscopic views.  After negative aspirate for air, blood, and CSF, a 2 ml. volume of Isovue-250 was injected into the epidural space and the flow of contrast was observed. Radiographs were obtained for documentation purposes.    The injectate was administered into the level noted above.   Additional Comments:  The patient tolerated the procedure well Dressing: 2 x 2 sterile gauze and Band-Aid    Post-procedure details: Patient was observed during the procedure. Post-procedure instructions were reviewed.  Patient left the clinic in stable condition.    Clinical History: MRI LUMBAR SPINE WITHOUT CONTRAST   TECHNIQUE: Multiplanar, multisequence MR imaging of the lumbar spine was performed. No intravenous contrast was administered.   COMPARISON:  None.   FINDINGS: Segmentation: Standard segmentation is assumed. The inferior-most fully formed intervertebral disc is labeled L5-S1.   Alignment:  No substantial sagittal subluxation.   Vertebrae: Degenerative/discogenic endplate signal changes about the L5-S1 disc.   Conus medullaris and cauda equina: Conus extends to the L2 level. Conus appears  normal.   Paraspinal and other soft tissues: Bilateral renal cysts, partially imaged.   Disc levels:   T12-L1: No significant disc protrusion, foraminal stenosis, or canal stenosis.   L1-L2: No significant disc protrusion, foraminal stenosis, or canal stenosis.   L2-L3: Mild disc bulging and facet hypertrophy without significant canal or foraminal stenosis.   L3-L4: Mild disc bulging and facet hypertrophy without significant canal or foraminal stenosis.   L4-L5: Broad-based disc bulge with superimposed left foraminal disc protrusion. Bilateral facet hypertrophy and ligamentum flavum thickening. There is moderate canal and bilateral lateral recess stenosis at the inferior L4-L5 level and along the posterior aspect of the L5 vertebral body secondary to degenerative changes and prominent ventral epidural fat. Mild left foraminal stenosis   L5-S1: Prominent ventral epidural fat at L5 which moderately narrows the canal. Additionally, there is broad-based disc bulge with superimposed left subarticular/foraminal disc protrusion which results in moderate to severe left subarticular recess stenosis with suspected impingement of the descending left S1 nerve root. Mild right subarticular recess stenosis and mild canal stenosis   IMPRESSION: 1. At L5-S1 there is broad-based disc bulge with superimposed left subarticular/foraminal disc protrusion which results in moderate to severe left subarticular recess stenosis with suspected impingement of the descending left S1 nerve root. Moderate left and mild right foraminal stenosis and mild to moderate canal stenosis at this level. 2. Moderate canal and bilateral lateral recess stenosis at the inferior L4-L5 level and at the L5 vertebral body levels secondary to prominent ventral epidural fat and degenerative change. Mild left foraminal stenosis at L4-L5     Electronically Signed   By: Margaretha Sheffield MD   On: 04/04/2021 08:32      Objective:  VS:  HT:    WT:   BMI:     BP:117/75  HR:(!) 42bpm  TEMP: ( )  RESP:  Physical Exam Vitals and nursing note reviewed.  Constitutional:      General: He is not in acute distress.    Appearance: Normal appearance. He is obese. He is not ill-appearing.  HENT:     Head: Normocephalic and atraumatic.     Right Ear: External ear normal.     Left Ear: External ear normal.     Nose: No congestion.  Eyes:     Extraocular Movements: Extraocular movements intact.  Cardiovascular:     Rate and Rhythm: Normal rate.     Pulses: Normal pulses.  Pulmonary:     Effort: Pulmonary effort is normal. No respiratory distress.  Abdominal:     General: There is no distension.     Palpations: Abdomen is soft.  Musculoskeletal:        General: No tenderness or signs of injury.     Cervical back: Neck supple.     Right lower leg: No edema.     Left lower leg: No edema.     Comments: Patient has good distal strength without clonus.  Skin:    Findings: No erythema or rash.  Neurological:     General: No focal deficit present.     Mental Status: He is alert and oriented to person, place, and time.     Sensory: No sensory deficit.     Motor: No weakness or abnormal muscle tone.     Coordination: Coordination normal.  Psychiatric:        Mood and Affect: Mood normal.        Behavior: Behavior normal.     Imaging: No results found.

## 2021-09-22 ENCOUNTER — Other Ambulatory Visit: Payer: Self-pay | Admitting: Family Medicine

## 2021-09-22 NOTE — Progress Notes (Deleted)
Patient ID: Michael [name], male    DOB: [birth year]  MRN: [id number]  CC: Weight Management Follow-Up   Subjective: Michael [name] is a 50 y.o. male who presents for weight management follow-up.  His concerns today include:   Weight follow-up  Patient Active Problem List   Diagnosis Date Noted   CHF (congestive heart failure), NYHA class I, acute, diastolic ([place]) 25/95/6387   Central sleep apnea comorbid with prescribed opioid use 09/21/2019   History of uncontrolled hypertension 09/21/2019   Complex sleep apnea syndrome 08/18/2019   Claustrophobia 08/18/2019   Anxiety associated with depression 08/18/2019   Sleep disturbance 10/01/2018   Low HDL (under 40) 05/11/2017   Moderate major depression ([place]) 05/11/2017   Hypokalemia 04/04/2017   Morbid obesity ([place]) 04/04/2017   Chest wall pain 04/04/2017   Asthma 02/13/2016   Non compliance with medical treatment 06/13/2015   OSA (obstructive sleep apnea) 04/06/2013   Abnormal EKG 11/08/2012   Essential hypertension    HTN (hypertension), malignant 04/29/2012   Migraine headache with aura 04/29/2012     Current Outpatient Medications on File Prior to Visit  Medication Sig Dispense Refill   acetaminophen (TYLENOL) 325 MG tablet Take 650 mg by mouth every 6 (six) hours as needed for mild pain or headache.     amLODipine (NORVASC) 10 MG tablet Take 1 tablet (10 mg total) by mouth daily. 90 tablet 1   buPROPion (WELLBUTRIN XL) 300 MG 24 hr tablet Take 1 tablet (300 mg total) by mouth daily. 90 tablet 1   carvedilol (COREG) 6.25 MG tablet Take 1 tablet (6.25 mg total) by mouth 2 (two) times daily. 60 tablet 2   celecoxib (CELEBREX) 200 MG capsule Take 1 capsule (200 mg total) by mouth 2 (two) times daily as needed. (Patient taking differently: Take 200 mg by mouth 2 (two) times daily as needed for moderate pain.) 60 capsule 6   chlorthalidone (HYGROTON) 50 MG tablet Take 1 tablet by mouth every day 90 tablet 0   gabapentin (NEURONTIN)  100 MG capsule 1 PO q HS, may increase to 1 PO TID if needed (Patient taking differently: Take 100 mg by mouth at bedtime. 1 PO q HS, may increase to 1 PO TID if needed) 90 capsule 3   Insulin Pen Needle (PEN NEEDLES) 31G X 8 MM MISC UAD 100 each 0   lisinopril (ZESTRIL) 40 MG tablet Take 1 tablet by mouth every day 90 tablet 0   potassium chloride (KLOR-CON) 10 MEQ tablet Take 1 tablet by mouth every day 30 tablet 0   propranolol ER (INDERAL LA) 80 MG 24 hr capsule Take 1 capsule (80 mg total) by mouth daily. 90 capsule 0   QUEtiapine (SEROQUEL) 50 MG tablet Take 1 tablet (50 mg total) by mouth at bedtime. (Patient taking differently: Take 50 mg by mouth daily.) 90 tablet 0   Semaglutide,0.25 or 0.5MG /DOS, 2 MG/1.5ML SOPN Inject 0.25 mg into the skin once a week. 1.5 mL 0   tamsulosin (FLOMAX) 0.4 MG CAPS capsule Take 0.4 mg by mouth at bedtime.     tiZANidine (ZANAFLEX) 2 MG tablet TAKE 1 TO 2 TABLETS BY MOUTH EVERY 6 HOURS AS NEEDED FOR MUSCLE SPASM 60 tablet 0   valACYclovir (VALTREX) 1000 MG tablet Take 1 tablet (1,000 mg total) by mouth 2 (two) times daily. 14 tablet 6   Current Facility-Administered Medications on File Prior to Visit  Medication Dose Route Frequency Provider Last Rate Last Admin   0.9 %  sodium chloride infusion  500 mL Intravenous Continuous Ladene Artist, MD        Allergies  Allergen Reactions   Statins Other (See Comments)    unknown    Social History   Socioeconomic History   Marital status: Divorced    Spouse name: Not on file   Number of children: 4   Years of education: Not on file   Highest education level: Not on file  Occupational History   Occupation: Unemployed//disabled  Tobacco Use   Smoking status: Never   Smokeless tobacco: Never  Vaping Use   Vaping Use: Never used  Substance and Sexual Activity   Alcohol use: No    Alcohol/week: 0.0 standard drinks   Drug use: Yes    Types: Marijuana   Sexual activity: Yes    Comment: per pt's  blue health form - 1 sex partner in last 12 months  Other Topics Concern   Not on file  Social History Narrative   Marital status: single; living with fiance      Children: 4 children, no grandchildren.      Employment:  Disability for learning disabilities since 2012.   Right-handed   Caffeine: occasional         Social Determinants of Radio broadcast assistant Strain: High Risk   Difficulty of Paying Living Expenses: Very hard  Food Insecurity: No Food Insecurity   Worried About Charity fundraiser in the Last Year: Never true   Ran Out of Food in the Last Year: Never true  Transportation Needs: No Transportation Needs   Lack of Transportation (Medical): No   Lack of Transportation (Non-Medical): No  Physical Activity: Inactive   Days of Exercise per Week: 0 days   Minutes of Exercise per Session: 0 min  Stress: Stress Concern Present   Feeling of Stress : To some extent  Social Connections: Socially Isolated   Frequency of Communication with Friends and Family: More than three times a week   Frequency of Social Gatherings with Friends and Family: Once a week   Attends Religious Services: Never   Marine scientist or Organizations: No   Attends Music therapist: Never   Marital Status: Never married  Human resources officer Violence: Not At Risk   Fear of Current or Ex-Partner: No   Emotionally Abused: No   Physically Abused: No   Sexually Abused: No    Family History  Problem Relation Age of Onset   Cancer Father        prostate, colon- age was in his 21's   Heart disease Father 53       MI    Hypertension Father    Colon cancer Father    Diabetes Mother    Hypertension Mother    Hypertension Brother    Cancer Maternal Grandfather    Cancer Paternal Grandfather    Esophageal cancer Neg Hx    Stomach cancer Neg Hx    Rectal cancer Neg Hx     Past Surgical History:  Procedure Laterality Date   Admission  04/2012   Malignant HTN, HA.  CT head  negative, cardiology consult.     CYSTOSCOPY WITH RETROGRADE PYELOGRAM, URETEROSCOPY AND STENT PLACEMENT Left 01/24/2019   Procedure: CYSTOSCOPY WITH RETROGRADE PYELOGRAM, URETEROSCOPY AND STENT PLACEMENT;  Surgeon: Cleon Gustin, MD;  Location: [facility];  Service: Urology;  Laterality: Left;   HOLMIUM LASER APPLICATION Left 2/44/0102   Procedure: HOLMIUM LASER APPLICATION;  Surgeon: Cleon Gustin, MD;  Location: [facility];  Service: Urology;  Laterality: Left;   MANDIBLE SURGERY  1998   metal plate    ROS: Review of Systems Negative except as stated above  PHYSICAL EXAM: There were no vitals taken for this visit.  Physical Exam  {male adult master:310786} {male adult master:310785}  CMP Latest Ref Rng & Units 06/29/2021 02/27/2021 11/16/2020  Glucose 70 - 99 mg/dL 107(H) 94 116(H)  BUN 6 - 20 mg/dL 13 25(H) 13  Creatinine 0.61 - 1.24 mg/dL 1.09 1.28(H) 0.94  Sodium 135 - 145 mmol/L 139 140 140  Potassium 3.5 - 5.1 mmol/L 3.0(L) 3.5 3.6  Chloride 98 - 111 mmol/L 104 99 103  CO2 22 - 32 mmol/L 29 25 27   Calcium 8.9 - 10.3 mg/dL 8.9 9.4 8.8(L)  Total Protein 6.5 - 8.1 g/dL 7.2 - 7.6  Total Bilirubin 0.3 - 1.2 mg/dL 0.8 - 0.7  Alkaline Phos 38 - 126 U/L 46 - 55  AST 15 - 41 U/L 16 - 17  ALT 0 - 44 U/L 14 - 15   Lipid Panel     Component Value Date/Time   CHOL 159 03/22/2020 1550   TRIG 79 03/22/2020 1550   HDL 44 03/22/2020 1550   CHOLHDL 3.6 03/22/2020 1550   CHOLHDL 3.2 06/13/2015 1118   VLDL 8 06/13/2015 1118   LDLCALC 100 (H) 03/22/2020 1550    CBC    Component Value Date/Time   WBC 7.4 06/29/2021 1339   RBC 4.36 06/29/2021 1339   HGB 12.7 (L) 06/29/2021 1339   HCT 39.6 06/29/2021 1339   PLT 223 06/29/2021 1339   MCV 90.8 06/29/2021 1339   MCV 84.6 06/13/2015 1132   MCH 29.1 06/29/2021 1339   MCHC 32.1 06/29/2021 1339   RDW 14.8 06/29/2021 1339   LYMPHSABS 3.5 06/29/2021 1339   MONOABS 0.4 06/29/2021 1339    EOSABS 0.1 06/29/2021 1339   BASOSABS 0.0 06/29/2021 1339    ASSESSMENT AND PLAN:  There are no diagnoses linked to this encounter.   Patient was given the opportunity to ask questions.  Patient verbalized understanding of the plan and was able to repeat key elements of the plan. Patient was given clear instructions to go to Emergency Department or return to medical center if symptoms don't improve, worsen, or new problems develop.The patient verbalized understanding.   No orders of the defined types were placed in this encounter.    Requested Prescriptions    No prescriptions requested or ordered in this encounter    No follow-ups on file.  [name], NP

## 2021-09-25 ENCOUNTER — Ambulatory Visit: Payer: Medicare Other | Admitting: Family

## 2021-09-25 DIAGNOSIS — Z7689 Persons encountering health services in other specified circumstances: Secondary | ICD-10-CM

## 2021-10-04 ENCOUNTER — Ambulatory Visit: Payer: Medicare Other | Admitting: Interventional Cardiology

## 2021-10-22 ENCOUNTER — Other Ambulatory Visit: Payer: Self-pay | Admitting: Family

## 2021-10-22 DIAGNOSIS — I10 Essential (primary) hypertension: Secondary | ICD-10-CM

## 2021-11-18 NOTE — Progress Notes (Signed)
Virtual Visit via Telephone Note  I connected with Michael [name], on 11/19/2021 at 4:09 PM by telephone due to the COVID-19 pandemic and verified that I am speaking with the correct person using two identifiers.  Due to current restrictions/limitations of in-office visits due to the COVID-19 pandemic, this scheduled clinical appointment was converted to a telehealth visit.   Consent: I discussed the limitations, risks, security and privacy concerns of performing an evaluation and management service by telephone and the availability of in person appointments. I also discussed with the patient that there may be a patient responsible charge related to this service. The patient expressed understanding and agreed to proceed.   Location of Patient: Home  Location of Provider: [facility] [place] [facility] participating in Telemedicine visit: Salvador Linck Marletta Bousquet Minette Brine, NP [place] Else, [place]   History of Present Illness: Michael [name] is a 50 year-old male who presents for back pain.   Reports ight upper side pain began 2 months ago and persisting on intermittent basis since then. Worse with cough of which is nonproductive. Chest pain or shortness of breath only with coughing. Taking a spasm medication helps some. Thinks may be weather-related.    Past Medical History:  Diagnosis Date   Anxiety    Depression    Questionable per records. Not on any medication.    History of kidney stones    Hx of echocardiogram    a. Echo 12/13/12: Mild LVH, EF 32-35%, grade 1 diastolic dysfunction, mild LAE, mild RVE, mild RAE   Hypertension    Insomnia    Migraine    Noncompliance    OSA (obstructive sleep apnea)    a. Sleep Study 02/2013:  mod OSA, AHI 33 per hour, O2 sat nadir 85%   Allergies  Allergen Reactions   Statins Other (See Comments)    unknown    Current Outpatient Medications on File Prior to Visit  Medication Sig Dispense Refill   acetaminophen (TYLENOL)  325 MG tablet Take 650 mg by mouth every 6 (six) hours as needed for mild pain or headache. (Patient not taking: Reported on 11/19/2021)     amLODipine (NORVASC) 10 MG tablet Take 1 tablet (10 mg total) by mouth daily. 90 tablet 1   buPROPion (WELLBUTRIN XL) 300 MG 24 hr tablet Take 1 tablet (300 mg total) by mouth daily. 90 tablet 1   carvedilol (COREG) 6.25 MG tablet Take 1 tablet (6.25 mg total) by mouth 2 (two) times daily. 60 tablet 2   celecoxib (CELEBREX) 200 MG capsule Take 1 capsule (200 mg total) by mouth 2 (two) times daily as needed. (Patient taking differently: Take 200 mg by mouth 2 (two) times daily as needed for moderate pain.) 60 capsule 6   chlorthalidone (HYGROTON) 50 MG tablet Take 1 tablet by mouth every day 90 tablet 0   gabapentin (NEURONTIN) 100 MG capsule 1 PO q HS, may increase to 1 PO TID if needed (Patient taking differently: Take 100 mg by mouth at bedtime. 1 PO q HS, may increase to 1 PO TID if needed) 90 capsule 3   Insulin Pen Needle (PEN NEEDLES) 31G X 8 MM MISC UAD 100 each 0   lisinopril (ZESTRIL) 40 MG tablet Take 1 tablet by mouth every day 90 tablet 0   potassium chloride (KLOR-CON) 10 MEQ tablet Take 1 tablet by mouth every day 30 tablet 11   propranolol ER (INDERAL LA) 80 MG 24 hr capsule Take 1 capsule (  80 mg total) by mouth daily. 90 capsule 0   QUEtiapine (SEROQUEL) 50 MG tablet Take 1 tablet (50 mg total) by mouth at bedtime. (Patient taking differently: Take 50 mg by mouth daily.) 90 tablet 0   tamsulosin (FLOMAX) 0.4 MG CAPS capsule Take 0.4 mg by mouth at bedtime.     tiZANidine (ZANAFLEX) 2 MG tablet TAKE 1 TO 2 TABLETS BY MOUTH EVERY 6 HOURS AS NEEDED FOR MUSCLE SPASM 60 tablet 0   valACYclovir (VALTREX) 1000 MG tablet Take 1 tablet (1,000 mg total) by mouth 2 (two) times daily. 14 tablet 6   Current Facility-Administered Medications on File Prior to Visit  Medication Dose Route Frequency Provider Last Rate Last Admin   0.9 %  sodium chloride  infusion  500 mL Intravenous Continuous Ladene Artist, MD        Observations/Objective: Alert and oriented x 3. Not in acute distress. Physical examination not completed as this is a telemedicine visit.  Assessment and Plan: 1. Chronic cough: - Diagnostic chest x-ray for further evaluation.  - Benzonatate capsules as prescribed.  - Follow-up with primary provider as schedule. - benzonatate (TESSALON PERLES) 100 MG capsule; Take 1 capsule (100 mg total) by mouth 3 (three) times daily as needed for cough.  Dispense: 40 capsule; Refill: 0 - DG Chest 2 View; Future  Follow Up Instructions: Follow-up with primary provider as scheduled.   Patient was given clear instructions to go to Emergency Department or return to medical center if symptoms don't improve, worsen, or new problems develop.The patient verbalized understanding.  I discussed the assessment and treatment plan with the patient. The patient was provided an opportunity to ask questions and all were answered. The patient agreed with the plan and demonstrated an understanding of the instructions.   The patient was advised to call back or seek an in-person evaluation if the symptoms worsen or if the condition fails to improve as anticipated.    I provided 7 minutes total of non-face-to-face time during this encounter.   [name], NP  [facility] [place], [place] 11/19/2021, 4:09 PM

## 2021-11-19 ENCOUNTER — Other Ambulatory Visit: Payer: Self-pay

## 2021-11-19 ENCOUNTER — Telehealth (INDEPENDENT_AMBULATORY_CARE_PROVIDER_SITE_OTHER): Payer: Medicare Other | Admitting: Family

## 2021-11-19 DIAGNOSIS — R053 Chronic cough: Secondary | ICD-10-CM | POA: Diagnosis not present

## 2021-11-19 MED ORDER — BENZONATATE 100 MG PO CAPS: 100.0000 mg | ORAL_CAPSULE | Freq: Three times a day (TID) | ORAL | 0 refills | Status: DC | PRN

## 2021-11-19 NOTE — Progress Notes (Signed)
Pt presents for upper right side pain started about month ago,denies any nausea or vomiting feels pain mostly when he coughs

## 2021-11-21 ENCOUNTER — Other Ambulatory Visit: Payer: Self-pay | Admitting: Family

## 2021-11-21 DIAGNOSIS — I10 Essential (primary) hypertension: Secondary | ICD-10-CM

## 2021-11-25 ENCOUNTER — Ambulatory Visit (INDEPENDENT_AMBULATORY_CARE_PROVIDER_SITE_OTHER): Payer: Medicare Other

## 2021-11-25 ENCOUNTER — Other Ambulatory Visit: Payer: Self-pay

## 2021-11-25 ENCOUNTER — Other Ambulatory Visit: Payer: Self-pay | Admitting: Family Medicine

## 2021-11-25 ENCOUNTER — Other Ambulatory Visit: Payer: Medicare Other

## 2021-11-25 DIAGNOSIS — R053 Chronic cough: Secondary | ICD-10-CM

## 2021-11-25 IMAGING — DX DG CHEST 2V
2 series · 2 of 2 positions shown · non-contrast
Comparison: X-ray chest [DATE].

CLINICAL DATA: Chronic cough.

EXAM:
CHEST - 2 VIEW

[chest pa]
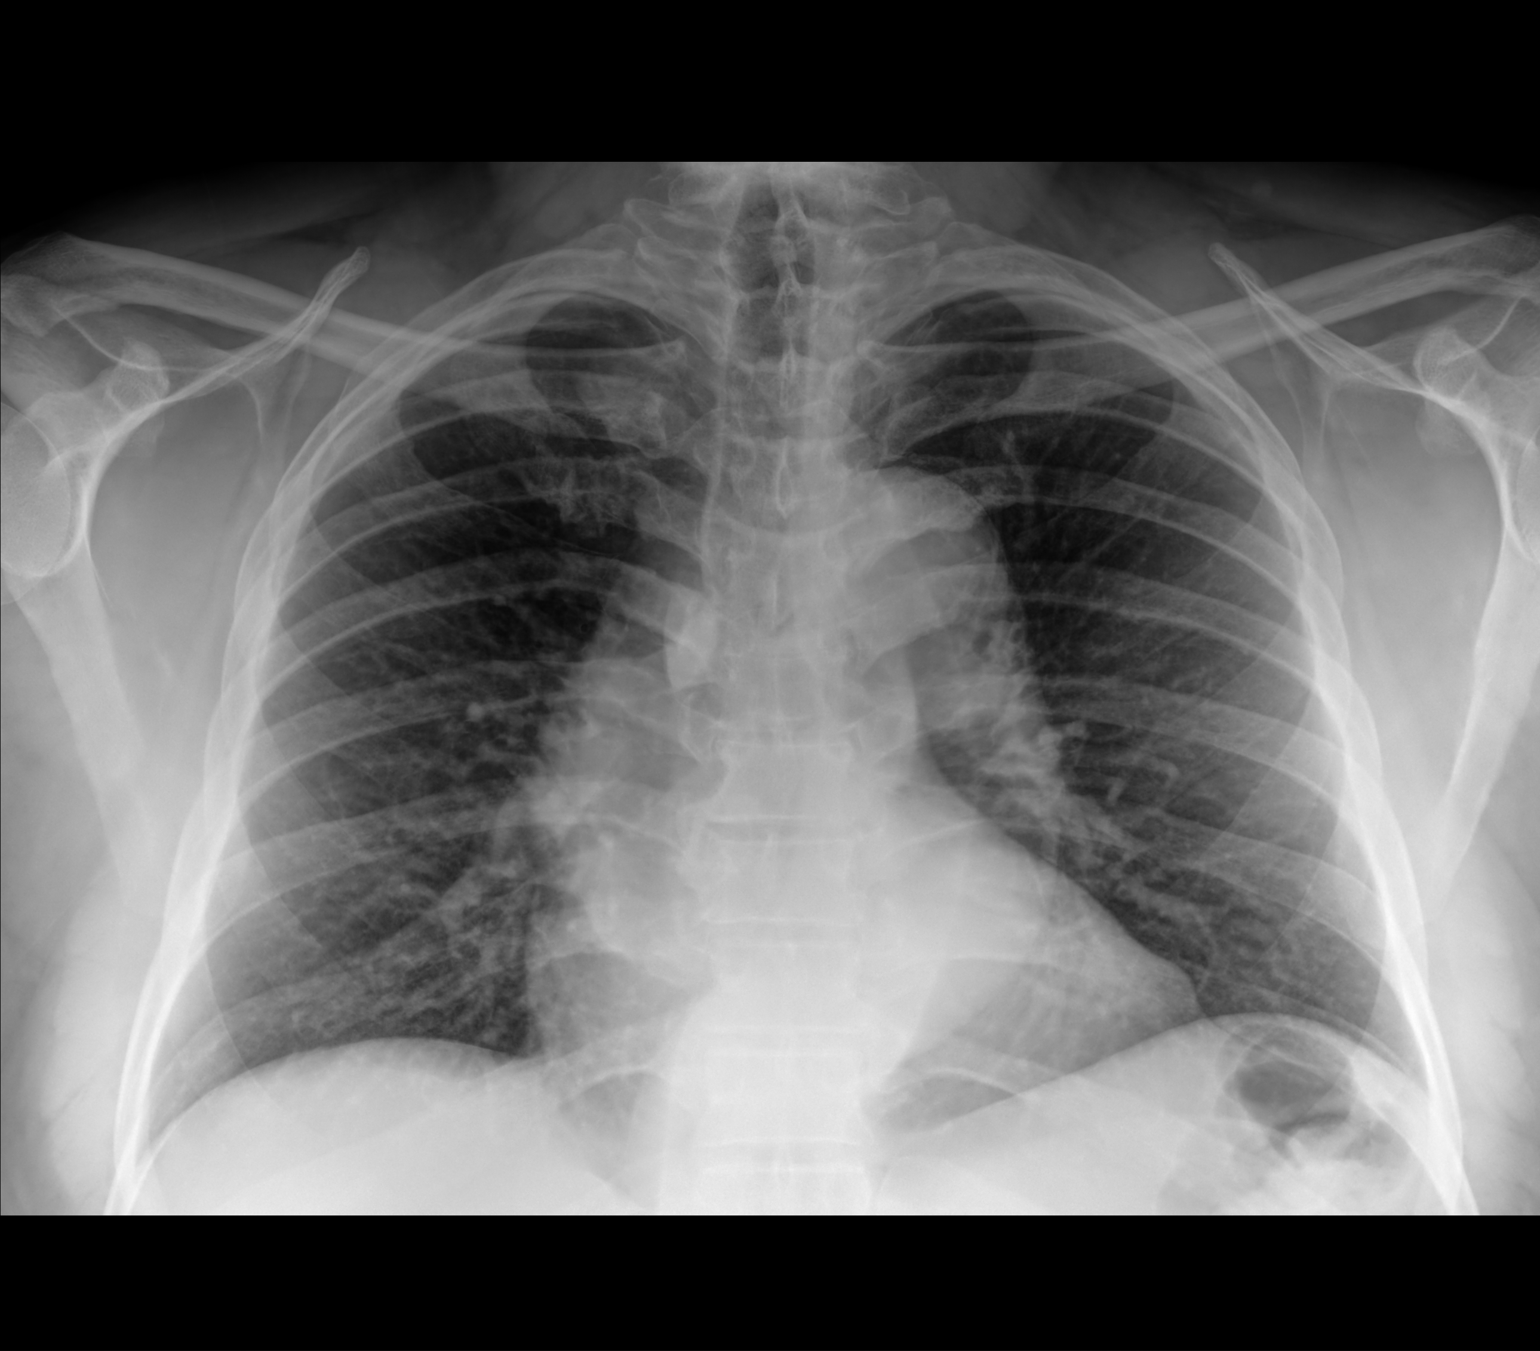

[chest lat]
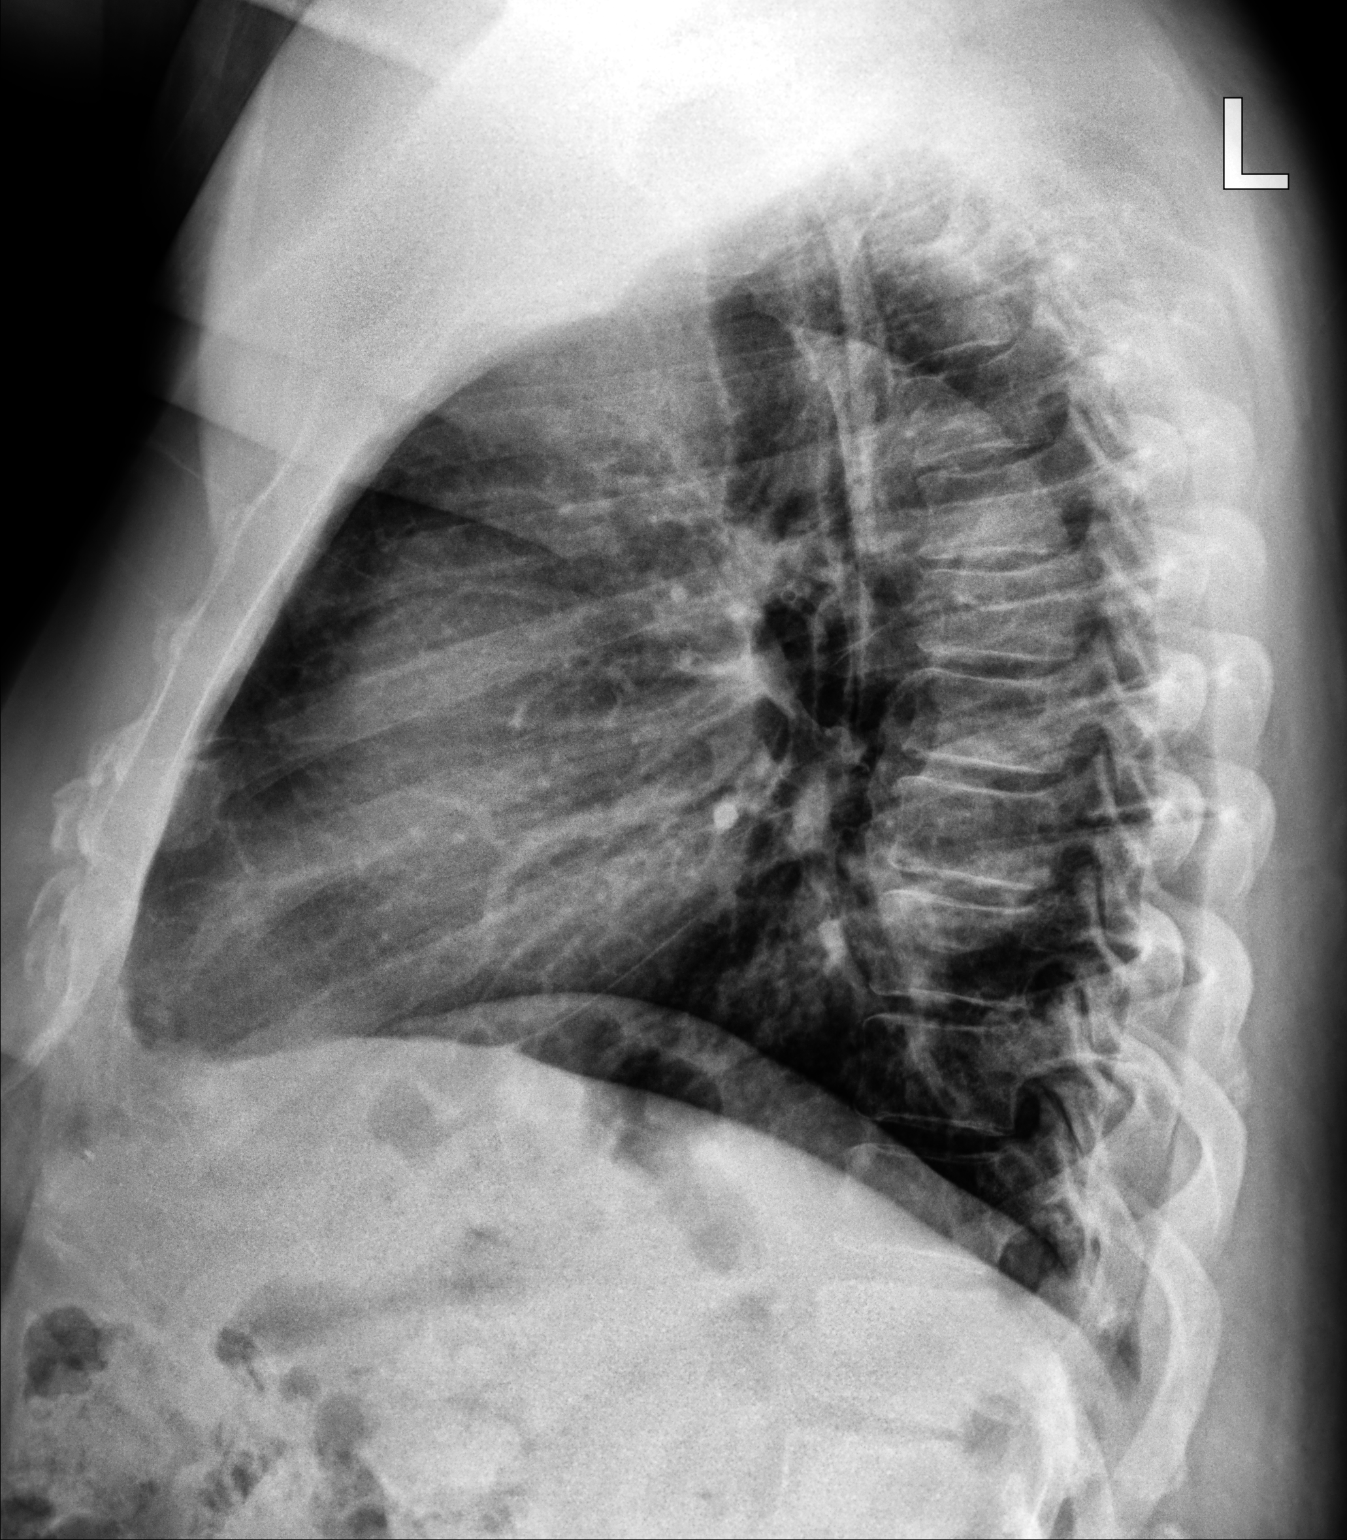

[2 of 2 positions shown; findings below may reference images not displayed]

FINDINGS: The cardiac silhouette is upper limits of normal in size. The
thoracic aorta is tortuous. No airspace consolidation, edema,
pleural effusion, or pneumothorax is identified. No acute osseous
abnormality is seen.
IMPRESSION: No active cardiopulmonary disease.

## 2021-11-26 ENCOUNTER — Encounter: Payer: Self-pay | Admitting: Orthopaedic Surgery

## 2021-11-26 ENCOUNTER — Other Ambulatory Visit: Payer: Self-pay | Admitting: Family

## 2021-11-26 ENCOUNTER — Ambulatory Visit (INDEPENDENT_AMBULATORY_CARE_PROVIDER_SITE_OTHER): Payer: Medicare Other | Admitting: Orthopaedic Surgery

## 2021-11-26 DIAGNOSIS — G8929 Other chronic pain: Secondary | ICD-10-CM | POA: Diagnosis not present

## 2021-11-26 DIAGNOSIS — M545 Low back pain, unspecified: Secondary | ICD-10-CM | POA: Diagnosis not present

## 2021-11-26 DIAGNOSIS — I771 Stricture of artery: Secondary | ICD-10-CM

## 2021-11-26 MED ORDER — PREDNISONE 10 MG (21) PO TBPK: ORAL_TABLET | ORAL | 3 refills | Status: DC

## 2021-11-26 NOTE — Progress Notes (Signed)
Heart about normal size. Lungs clear. Evident tortuous aorta. Referral to Cardiology for further evaluation and management. Their office should call patient within 2 weeks with appointment details.

## 2021-11-26 NOTE — Telephone Encounter (Signed)
Please make patient aware there is no past record of a hypertension appointment with me or any other provider in the office. Patient received previous medication refills as a courtesy with recommendation to follow-up with an appointment. Will refill Carvedilol (Coreg), Chlorthalidone (Hygroton), and Lisinopril (Zestril) for courtesy 30 day supply. Please schedule appointment for additional refills.

## 2021-11-26 NOTE — Progress Notes (Signed)
Office Visit Note   Patient: Michael [name]           Date of Birth: 1971/11/27           MRN: [id number] Visit Date: 11/26/2021              Requested by: [name], NP [place],  [place] [id number] PCP: [name], NP   Assessment & Plan: Visit Diagnoses:  1. Chronic low back pain, unspecified back pain laterality, unspecified whether sciatica present     Plan: Belton comes in today for recent worsening of low back pain with occasional radiation down both legs.  He has had an MRI back in April which showed degenerative changes and stenosis.  He has had an ESI with Dr. Ernestina Patches with good relief.  He feels that he continues to have symptoms with frequent exacerbations.  Exam is unchanged.  No focal weakness or sensory deficits.  At this point Daylyn has requested a referral to Dr. Louanne Skye for surgical consultation due to ongoing symptoms despite conservative management.  I did send in a prednisone Dosepak.  Follow-up with me as needed.  Follow-Up Instructions: Return for needs appt with Nitka.   Orders:  No orders of the defined types were placed in this encounter.  Meds ordered this encounter  Medications   predniSONE (STERAPRED UNI-PAK 21 TAB) 10 MG (21) TBPK tablet    Sig: Take as directed    Dispense:  21 tablet    Refill:  3      Procedures: No procedures performed   Clinical Data: No additional findings.   Subjective: Chief Complaint  Patient presents with   Lower Back - Pain    HPI  Review of Systems   Objective: Vital Signs: There were no vitals taken for this visit.  Physical Exam  Ortho Exam  Specialty Comments:  No specialty comments available.  Imaging: DG Chest 2 View  Result Date: 11/25/2021 CLINICAL DATA:  Chronic cough. EXAM: CHEST - 2 VIEW COMPARISON:  X-ray chest 06/29/2021. FINDINGS: The cardiac silhouette is upper limits of normal in size. The thoracic aorta is tortuous. No airspace consolidation,  edema, pleural effusion, or pneumothorax is identified. No acute osseous abnormality is seen. IMPRESSION: No active cardiopulmonary disease. Electronically Signed   By: Logan Bores M.D.   On: 11/25/2021 16:10     PMFS History: Patient Active Problem List   Diagnosis Date Noted   CHF (congestive heart failure), NYHA class I, acute, diastolic ([place]) 15/17/6160   Central sleep apnea comorbid with prescribed opioid use 09/21/2019   History of uncontrolled hypertension 09/21/2019   Complex sleep apnea syndrome 08/18/2019   Claustrophobia 08/18/2019   Anxiety associated with depression 08/18/2019   Sleep disturbance 10/01/2018   Low HDL (under 40) 05/11/2017   Moderate major depression ([place]) 05/11/2017   Hypokalemia 04/04/2017   Morbid obesity ([place]) 04/04/2017   Chest wall pain 04/04/2017   Asthma 02/13/2016   Non compliance with medical treatment 06/13/2015   OSA (obstructive sleep apnea) 04/06/2013   Abnormal EKG 11/08/2012   Essential hypertension    HTN (hypertension), malignant 04/29/2012   Migraine headache with aura 04/29/2012   Past Medical History:  Diagnosis Date   Anxiety    Depression    Questionable per records. Not on any medication.    History of kidney stones    Hx of echocardiogram    a. Echo 12/13/12: Mild LVH, EF 73-71%, grade 1 diastolic dysfunction, mild  LAE, mild RVE, mild RAE   Hypertension    Insomnia    Migraine    Noncompliance    OSA (obstructive sleep apnea)    a. Sleep Study 02/2013:  mod OSA, AHI 33 per hour, O2 sat nadir 85%    Family History  Problem Relation Age of Onset   Cancer Father        prostate, colon- age was in his 64's   Heart disease Father 67       MI    Hypertension Father    Colon cancer Father    Diabetes Mother    Hypertension Mother    Hypertension Brother    Cancer Maternal Grandfather    Cancer Paternal Grandfather    Esophageal cancer Neg Hx    Stomach cancer Neg Hx    Rectal cancer Neg Hx     Past Surgical  History:  Procedure Laterality Date   Admission  04/2012   Malignant HTN, HA.  CT head negative, cardiology consult.     CYSTOSCOPY WITH RETROGRADE PYELOGRAM, URETEROSCOPY AND STENT PLACEMENT Left 01/24/2019   Procedure: CYSTOSCOPY WITH RETROGRADE PYELOGRAM, URETEROSCOPY AND STENT PLACEMENT;  Surgeon: Cleon Gustin, MD;  Location: [facility];  Service: Urology;  Laterality: Left;   HOLMIUM LASER APPLICATION Left 3/73/4287   Procedure: HOLMIUM LASER APPLICATION;  Surgeon: Cleon Gustin, MD;  Location: [facility];  Service: Urology;  Laterality: Left;   MANDIBLE SURGERY  1998   metal plate   Social History   Occupational History   Occupation: Unemployed//disabled  Tobacco Use   Smoking status: Never   Smokeless tobacco: Never  Vaping Use   Vaping Use: Never used  Substance and Sexual Activity   Alcohol use: No    Alcohol/week: 0.0 standard drinks   Drug use: Yes    Types: Marijuana   Sexual activity: Yes    Comment: per pt's blue health form - 1 sex partner in last 12 months

## 2021-12-05 ENCOUNTER — Telehealth (INDEPENDENT_AMBULATORY_CARE_PROVIDER_SITE_OTHER): Payer: Medicare Other | Admitting: Nurse Practitioner

## 2021-12-05 ENCOUNTER — Encounter: Payer: Self-pay | Admitting: Nurse Practitioner

## 2021-12-05 ENCOUNTER — Other Ambulatory Visit: Payer: Self-pay

## 2021-12-05 DIAGNOSIS — J0100 Acute maxillary sinusitis, unspecified: Secondary | ICD-10-CM

## 2021-12-05 MED ORDER — AMOXICILLIN 875 MG PO TABS: 875.0000 mg | ORAL_TABLET | Freq: Two times a day (BID) | ORAL | 0 refills | Status: AC

## 2021-12-05 MED ORDER — TIZANIDINE HCL 4 MG PO TABS: 4.0000 mg | ORAL_TABLET | Freq: Four times a day (QID) | ORAL | 0 refills | Status: DC | PRN

## 2021-12-05 MED ORDER — CETIRIZINE HCL 10 MG PO TABS: 10.0000 mg | ORAL_TABLET | Freq: Every day | ORAL | 0 refills | Status: DC

## 2021-12-05 NOTE — Patient Instructions (Addendum)
Sinusitis Headache Neck pain:  Stay well hydrated  Stay active  Deep breathing exercises  May take tylenol or fever or pain  May take mucinex DM twice daily  Will order order amoxicillin, zyrtec , and Zanaflex  Follow up:  Follow up if needed

## 2021-12-05 NOTE — Progress Notes (Signed)
Virtual Visit via Telephone Note  I connected with Michael [name] on 12/05/21 at  9:20 AM EST by telephone and verified that I am speaking with the correct person using two identifiers.  Location: Patient: home Provider: office   I discussed the limitations, risks, security and privacy concerns of performing an evaluation and management service by telephone and the availability of in person appointments. I also discussed with the patient that there may be a patient responsible charge related to this service. The patient expressed understanding and agreed to proceed.   History of Present Illness:  Patient presents today for sick visit through telephone visit.  He states that he has been having headache with sinus pressure and pain.  He has had left sided neck pain.  He feels as though he has a sinus infection. Denies f/c/s, n/v/d, hemoptysis, PND, chest pain or edema.     Observations/Objective:  Vitals with BMI 08/28/2021 08/12/2021 07/05/2021  Height - - 5\' 10"   Weight - - 295 lbs 13 oz  BMI - - 15.18  Systolic 343 735 789  Diastolic 75 84 89  Pulse 42 84 75      Assessment and Plan:  Sinusitis Headache Neck pain:  Stay well hydrated  Stay active  Deep breathing exercises  May take tylenol or fever or pain  May take mucinex DM twice daily  Will order order amoxicillin, zyrtec , and zanaflex  Follow up:  Follow up if needed     I discussed the assessment and treatment plan with the patient. The patient was provided an opportunity to ask questions and all were answered. The patient agreed with the plan and demonstrated an understanding of the instructions.   The patient was advised to call back or seek an in-person evaluation if the symptoms worsen or if the condition fails to improve as anticipated.  I provided 23 minutes of non-face-to-face time during this encounter.   Fenton Foy, NP

## 2021-12-12 ENCOUNTER — Ambulatory Visit: Payer: Medicare Other | Admitting: Specialist

## 2021-12-19 ENCOUNTER — Ambulatory Visit: Payer: Medicare Other | Admitting: Specialist

## 2021-12-28 ENCOUNTER — Other Ambulatory Visit: Payer: Self-pay | Admitting: Family

## 2021-12-28 DIAGNOSIS — I10 Essential (primary) hypertension: Secondary | ICD-10-CM

## 2021-12-31 NOTE — Telephone Encounter (Signed)
Chlorthalidone and Carvedilol refilled for 30 day courtesy. Schedule appointment for additional refills.

## 2022-01-10 ENCOUNTER — Ambulatory Visit: Payer: Self-pay

## 2022-01-10 ENCOUNTER — Ambulatory Visit (INDEPENDENT_AMBULATORY_CARE_PROVIDER_SITE_OTHER): Payer: Medicare Other | Admitting: Specialist

## 2022-01-10 ENCOUNTER — Encounter: Payer: Self-pay | Admitting: Specialist

## 2022-01-10 ENCOUNTER — Other Ambulatory Visit: Payer: Self-pay

## 2022-01-10 VITALS — BP 133/95 | HR 83 | Ht 70.0 in | Wt 269.0 lb

## 2022-01-10 DIAGNOSIS — M545 Low back pain, unspecified: Secondary | ICD-10-CM | POA: Diagnosis not present

## 2022-01-10 DIAGNOSIS — M5136 Other intervertebral disc degeneration, lumbar region: Secondary | ICD-10-CM

## 2022-01-10 DIAGNOSIS — M51369 Other intervertebral disc degeneration, lumbar region without mention of lumbar back pain or lower extremity pain: Secondary | ICD-10-CM

## 2022-01-10 DIAGNOSIS — M5126 Other intervertebral disc displacement, lumbar region: Secondary | ICD-10-CM | POA: Diagnosis not present

## 2022-01-10 DIAGNOSIS — G8929 Other chronic pain: Secondary | ICD-10-CM | POA: Diagnosis not present

## 2022-01-10 MED ORDER — GABAPENTIN 300 MG PO CAPS: 300.0000 mg | ORAL_CAPSULE | Freq: Every day | ORAL | 2 refills | Status: DC

## 2022-01-10 MED ORDER — DICLOFENAC SODIUM 50 MG PO TBEC: 50.0000 mg | DELAYED_RELEASE_TABLET | Freq: Two times a day (BID) | ORAL | 2 refills | Status: DC

## 2022-01-10 NOTE — Progress Notes (Signed)
Office Visit Note   Patient: Michael [name]           Date of Birth: August 16, 1971           MRN: [id number] Visit Date: 01/10/2022              Requested by: [name], NP [place] [address] [place],  [place] [address] PCP: [name], NP   Assessment & Plan: Visit Diagnoses:  1. Chronic low back pain, unspecified back pain laterality, unspecified whether sciatica present   2. Lumbar disc herniation     Plan: Avoid bending, stooping and avoid lifting weights greater than 10 lbs. Avoid prolong standing and walking. Avoid frequent bending and stooping  No lifting greater than 10 lbs. May use ice or moist heat for pain. Weight loss is of benefit. Handicap license is approved. Dr. Romona Curls secretary/Assistant will call to arrange for epidural steroid injection   Plan is to numb the nerve that appears pinched and if it relieves pain then we can  Consider a surgery that decompresses or removes the pressure on the nerve.  Follow-Up Instructions: No follow-ups on file.   Orders:  Orders Placed This Encounter  Procedures   XR Lumbar Spine 2-3 Views   No orders of the defined types were placed in this encounter.     Procedures: No procedures performed   Clinical Data: No additional findings.   Subjective: Chief Complaint  Patient presents with   Lower Back - Follow-up    50  Review of Systems  Constitutional: Negative.   HENT: Negative.    Eyes: Negative.   Respiratory: Negative.    Cardiovascular: Negative.   Gastrointestinal: Negative.   Endocrine: Negative.   Genitourinary: Negative.   Musculoskeletal: Negative.   Skin: Negative.   Allergic/Immunologic: Negative.   Neurological: Negative.   Hematological: Negative.   Psychiatric/Behavioral: Negative.      Objective: Vital Signs: BP (!) 133/95 (BP Location: Left Arm, Patient Position: Sitting)    Pulse 83    Ht 5\' 10"  (1.778 m)    Wt 269 lb (122 kg)    BMI 38.60 kg/m   Physical  Exam Constitutional:      Appearance: He is well-developed.  HENT:     Head: Normocephalic and atraumatic.  Eyes:     Pupils: Pupils are equal, round, and reactive to light.  Pulmonary:     Effort: Pulmonary effort is normal.     Breath sounds: Normal breath sounds.  Abdominal:     General: Bowel sounds are normal.     Palpations: Abdomen is soft.  Musculoskeletal:     Cervical back: Normal range of motion and neck supple.     Lumbar back: Negative right straight leg raise test and negative left straight leg raise test.  Skin:    General: Skin is warm and dry.  Neurological:     Mental Status: He is alert and oriented to person, place, and time.  Psychiatric:        Behavior: Behavior normal.        Thought Content: Thought content normal.        Judgment: Judgment normal.   Back Exam   Tenderness  The patient is experiencing tenderness in the lumbar.  Range of Motion  Extension:  abnormal  Flexion:  abnormal  Lateral bend right:  abnormal  Lateral bend left:  abnormal  Rotation right:  abnormal  Rotation left:  abnormal   Muscle Strength  Right  Quadriceps:  5/5  Left Quadriceps:  5/5  Right Hamstrings:  5/5  Left Hamstrings:  5/5   Tests  Straight leg raise right: negative Straight leg raise left: negative  Other  Toe walk: normal Heel walk: normal  Comments:  Left EHL weak 4/5 left foot DF is 5-/5 SLR is negative.     Specialty Comments:  No specialty comments available.  Imaging: No results found.   PMFS History: Patient Active Problem List   Diagnosis Date Noted   Acute non-recurrent maxillary sinusitis 12/05/2021   CHF (congestive heart failure), NYHA class I, acute, diastolic ([place]) 36/64/4034   Central sleep apnea comorbid with prescribed opioid use 09/21/2019   History of uncontrolled hypertension 09/21/2019   Complex sleep apnea syndrome 08/18/2019   Claustrophobia 08/18/2019   Anxiety associated with depression 08/18/2019   Sleep  disturbance 10/01/2018   Low HDL (under 40) 05/11/2017   Moderate major depression ([place]) 05/11/2017   Hypokalemia 04/04/2017   Morbid obesity ([place]) 04/04/2017   Chest wall pain 04/04/2017   Asthma 02/13/2016   Non compliance with medical treatment 06/13/2015   OSA (obstructive sleep apnea) 04/06/2013   Abnormal EKG 11/08/2012   Essential hypertension    HTN (hypertension), malignant 04/29/2012   Migraine headache with aura 04/29/2012   Past Medical History:  Diagnosis Date   Anxiety    Depression    Questionable per records. Not on any medication.    History of kidney stones    Hx of echocardiogram    a. Echo 12/13/12: Mild LVH, EF 74-25%, grade 1 diastolic dysfunction, mild LAE, mild RVE, mild RAE   Hypertension    Insomnia    Migraine    Noncompliance    OSA (obstructive sleep apnea)    a. Sleep Study 02/2013:  mod OSA, AHI 33 per hour, O2 sat nadir 85%    Family History  Problem Relation Age of Onset   Cancer Father        prostate, colon- age was in his 35's   Heart disease Father 72       MI    Hypertension Father    Colon cancer Father    Diabetes Mother    Hypertension Mother    Hypertension Brother    Cancer Maternal Grandfather    Cancer Paternal Grandfather    Esophageal cancer Neg Hx    Stomach cancer Neg Hx    Rectal cancer Neg Hx     Past Surgical History:  Procedure Laterality Date   Admission  04/2012   Malignant HTN, HA.  CT head negative, cardiology consult.     CYSTOSCOPY WITH RETROGRADE PYELOGRAM, URETEROSCOPY AND STENT PLACEMENT Left 01/24/2019   Procedure: CYSTOSCOPY WITH RETROGRADE PYELOGRAM, URETEROSCOPY AND STENT PLACEMENT;  Surgeon: Cleon Gustin, MD;  Location: [facility];  Service: Urology;  Laterality: Left;   HOLMIUM LASER APPLICATION Left 9/56/3875   Procedure: HOLMIUM LASER APPLICATION;  Surgeon: Cleon Gustin, MD;  Location: [facility];  Service: Urology;  Laterality: Left;   MANDIBLE  SURGERY  1998   metal plate   Social History   Occupational History   Occupation: Unemployed//disabled  Tobacco Use   Smoking status: Never   Smokeless tobacco: Never  Vaping Use   Vaping Use: Never used  Substance and Sexual Activity   Alcohol use: No    Alcohol/week: 0.0 standard drinks   Drug use: Yes    Types: Marijuana   Sexual activity: Yes    Comment:  per pt's blue health form - 1 sex partner in last 12 months

## 2022-01-10 NOTE — Patient Instructions (Signed)
°  Plan: Avoid bending, stooping and avoid lifting weights greater than 10 lbs. Avoid prolong standing and walking. Avoid frequent bending and stooping  No lifting greater than 10 lbs. May use ice or moist heat for pain. Weight loss is of benefit. Handicap license is approved. Dr. Romona Curls secretary/Assistant will call to arrange for epidural steroid injection   Plan is to numb the nerve that appears pinched and if it relieves pain then we can  Consider a surgery that decompresses or removes the pressure on the nerve.

## 2022-01-14 ENCOUNTER — Ambulatory Visit: Payer: Commercial Managed Care - HMO | Admitting: Cardiology

## 2022-01-16 ENCOUNTER — Other Ambulatory Visit: Payer: Self-pay | Admitting: Family

## 2022-01-16 DIAGNOSIS — Z7689 Persons encountering health services in other specified circumstances: Secondary | ICD-10-CM

## 2022-01-16 DIAGNOSIS — Z6841 Body Mass Index (BMI) 40.0 and over, adult: Secondary | ICD-10-CM

## 2022-01-16 NOTE — Telephone Encounter (Signed)
Last appointment 08/26/2021. Schedule appointment for refills and to discuss more about plan of care.

## 2022-01-20 ENCOUNTER — Other Ambulatory Visit: Payer: Self-pay | Admitting: Family

## 2022-01-20 DIAGNOSIS — I10 Essential (primary) hypertension: Secondary | ICD-10-CM

## 2022-01-27 ENCOUNTER — Other Ambulatory Visit: Payer: Self-pay | Admitting: Family

## 2022-01-27 DIAGNOSIS — I10 Essential (primary) hypertension: Secondary | ICD-10-CM

## 2022-01-30 ENCOUNTER — Other Ambulatory Visit: Payer: Self-pay | Admitting: Family

## 2022-01-30 DIAGNOSIS — I10 Essential (primary) hypertension: Secondary | ICD-10-CM

## 2022-02-06 ENCOUNTER — Encounter: Payer: Self-pay | Admitting: Specialist

## 2022-02-06 ENCOUNTER — Ambulatory Visit (INDEPENDENT_AMBULATORY_CARE_PROVIDER_SITE_OTHER): Payer: Medicare Other | Admitting: Specialist

## 2022-02-06 ENCOUNTER — Other Ambulatory Visit: Payer: Self-pay

## 2022-02-06 VITALS — BP 165/117 | HR 71 | Ht 70.0 in | Wt 269.0 lb

## 2022-02-06 DIAGNOSIS — M5136 Other intervertebral disc degeneration, lumbar region: Secondary | ICD-10-CM

## 2022-02-06 DIAGNOSIS — M545 Low back pain, unspecified: Secondary | ICD-10-CM | POA: Diagnosis not present

## 2022-02-06 DIAGNOSIS — G8929 Other chronic pain: Secondary | ICD-10-CM

## 2022-02-06 NOTE — Patient Instructions (Addendum)
Avoid frequent bending and stooping  No lifting greater than 10 lbs. May use ice or moist heat for pain. Weight loss is of benefit. Best medication for lumbar disc disease is arthritis medications like motrin, celebrex and naprosyn. Exercise is important to improve your indurance and does allow people to function better inspite of back pain.  Best medication for lumbar disc disease is arthritis medications like motrin, celebrex and naprosyn. Exercise is important to improve your indurance and does allow people to function better inspite of back pain. Avoid bending, stooping and avoid lifting weights greater than 10 lbs. Avoid prolong standing and walking. Order for a new walker with wheels. Surgery scheduling secretary Kandice Hams, will call you in the next week to schedule for surgery.  Surgery recommended is a two level lumbar fusion L4-5 and L5-S1 this would be done with rods, screws and cages with local bone graft and allograft (donor bone graft). Take hydrocodone for for pain. Risk of surgery includes risk of infection 1 in 200 patients, bleeding 1/2% chance you would need a transfusion.   Risk to the nerves is one in 10,000. You will need to use a brace for 3 months and wean from the brace on the 4th month. Expect improved walking and standing tolerance. Expect relief of leg pain but numbness may persist depending on the length and degree of pressure that has been present.

## 2022-02-06 NOTE — Progress Notes (Signed)
Office Visit Note   Patient: Michael [name]           Date of Birth: 03-27-1971           MRN: [id number] Visit Date: 02/06/2022              Requested by: [name], NP [place] [place],  [place] [address] PCP: [name], NP   Assessment & Plan: Visit Diagnoses:  1. Chronic low back pain, unspecified back pain laterality, unspecified whether sciatica present   2. Other intervertebral disc degeneration, lumbar region     Plan: Avoid frequent bending and stooping  No lifting greater than 10 lbs. May use ice or moist heat for pain. Weight loss is of benefit. Best medication for lumbar disc disease is arthritis medications like motrin, celebrex and naprosyn. Exercise is important to improve your indurance and does allow people to function better inspite of back pain. Avoid bending, stooping and avoid lifting weights greater than 10 lbs. Avoid prolong standing and walking. Order for a new walker with wheels. Surgery scheduling secretary Kandice Hams, will call you in the next week to schedule for surgery.  Surgery recommended is a two level lumbar fusion L4-5 and L5-S1 this would be done with rods, screws and cages with local bone graft and allograft (donor bone graft). Take hydrocodone for for pain. Risk of surgery includes risk of infection 1 in 200 patients, bleeding 1/2% chance you would need a transfusion.   Risk to the nerves is one in 10,000. You will need to use a brace for 3 months and wean from the brace on the 4th month. Expect improved walking and standing tolerance. Expect relief of leg pain but numbness may persist depending on the length and degree of pressure that has been present.   Follow-Up Instructions: Return in about 4 weeks (around 03/06/2022).   Orders:  No orders of the defined types were placed in this encounter.  No orders of the defined types were placed in this encounter.     Procedures: No procedures  performed   Clinical Data: No additional findings.   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 He is not working now And reports that he is on leave now due to back pain and was concerned about returning to work. Was trying to do light duty, at [employer] with a position to move the carts but he is reporting that he doesn't feel safe doing that.  He is concerned about having to stop the carts when cars are going by. Back pain with changes in weather and with cold weather, it really hurts. He has been to PT twice and had been to [employer] and they helped. Was in PT for a month. He has been out of work about a year, worked at [employer] 2 years Concerned that he may end up in a wheelchair. He has been unable to work for about one year. Dr. Junius Roads did a scan on his lumbar spine.    Review of Systems  Constitutional: Negative.   HENT: Negative.    Eyes: Negative.   Respiratory: Negative.    Cardiovascular: Negative.   Gastrointestinal: Negative.   Endocrine: Negative.   Genitourinary: Negative.  Musculoskeletal: Negative.   Skin: Negative.   Allergic/Immunologic: Negative.   Neurological: Negative.   Hematological: Negative.   Psychiatric/Behavioral: Negative.      Objective: Vital Signs: BP (!) 165/117 (BP Location: Left Arm, Patient Position: Sitting)    Pulse 71    Ht 5\' 10"  (1.778 m)    Wt 269 lb (122 kg)    BMI 38.60 kg/m   Physical Exam Constitutional:      Appearance: He is well-developed.  HENT:     Head: Normocephalic and atraumatic.  Eyes:     Pupils: Pupils are equal, round, and reactive to light.  Pulmonary:     Effort: Pulmonary effort is normal.     Breath sounds: Normal breath sounds.  Abdominal:     General: Bowel sounds are normal.     Palpations: Abdomen is  soft.  Musculoskeletal:     Cervical back: Normal range of motion and neck supple.     Lumbar back: Negative right straight leg raise test.  Skin:    General: Skin is warm and dry.  Neurological:     Mental Status: He is alert and oriented to person, place, and time.  Psychiatric:        Behavior: Behavior normal.        Thought Content: Thought content normal.        Judgment: Judgment normal.    Back Exam   Tenderness  The patient is experiencing tenderness in the lumbar.  Range of Motion  Extension:  abnormal  Flexion:  abnormal  Lateral bend right:  abnormal  Lateral bend left:  abnormal  Rotation right:  abnormal  Rotation left:  abnormal   Muscle Strength  Right Quadriceps:  5/5  Left Quadriceps:  5/5  Right Hamstrings:  5/5  Left Hamstrings:  5/5   Tests  Straight leg raise right: negative  Reflexes  Patellar:  2/4 Achilles:  1/4 Biceps:  0/4 Babinski's sign: normal   Other  Toe walk: normal Heel walk: normal  Comments:  SLR is negative. No focal deficiet. Pain is right lateral thigh and right lateral leg.      Specialty Comments:  No specialty comments available.  Imaging: No results found.   PMFS History: Patient Active Problem List   Diagnosis Date Noted   Acute non-recurrent maxillary sinusitis 12/05/2021   CHF (congestive heart failure), NYHA class I, acute, diastolic ([place]) 37/34/2876   Central sleep apnea comorbid with prescribed opioid use 09/21/2019   History of uncontrolled hypertension 09/21/2019   Complex sleep apnea syndrome 08/18/2019   Claustrophobia 08/18/2019   Anxiety associated with depression 08/18/2019   Sleep disturbance 10/01/2018   Low HDL (under 40) 05/11/2017   Moderate major depression ([place]) 05/11/2017   Hypokalemia 04/04/2017   Morbid obesity ([place]) 04/04/2017   Chest wall pain 04/04/2017   Asthma 02/13/2016   Non compliance with medical treatment 06/13/2015   OSA (obstructive sleep apnea) 04/06/2013    Abnormal EKG 11/08/2012   Essential hypertension    HTN (hypertension), malignant 04/29/2012   Migraine headache with aura 04/29/2012   Past Medical History:  Diagnosis Date   Anxiety    Depression    Questionable per records. Not on any medication.    History of kidney stones    Hx of echocardiogram    a. Echo 12/13/12: Mild LVH, EF 81-15%, grade 1 diastolic dysfunction, mild LAE, mild RVE, mild RAE   Hypertension    Insomnia    Migraine    Noncompliance  OSA (obstructive sleep apnea)    a. Sleep Study 02/2013:  mod OSA, AHI 33 per hour, O2 sat nadir 85%    Family History  Problem Relation Age of Onset   Cancer Father        prostate, colon- age was in his 59's   Heart disease Father 77       MI    Hypertension Father    Colon cancer Father    Diabetes Mother    Hypertension Mother    Hypertension Brother    Cancer Maternal Grandfather    Cancer Paternal Grandfather    Esophageal cancer Neg Hx    Stomach cancer Neg Hx    Rectal cancer Neg Hx     Past Surgical History:  Procedure Laterality Date   Admission  04/2012   Malignant HTN, HA.  CT head negative, cardiology consult.     CYSTOSCOPY WITH RETROGRADE PYELOGRAM, URETEROSCOPY AND STENT PLACEMENT Left 01/24/2019   Procedure: CYSTOSCOPY WITH RETROGRADE PYELOGRAM, URETEROSCOPY AND STENT PLACEMENT;  Surgeon: Cleon Gustin, MD;  Location: [facility];  Service: Urology;  Laterality: Left;   HOLMIUM LASER APPLICATION Left 8/65/7846   Procedure: HOLMIUM LASER APPLICATION;  Surgeon: Cleon Gustin, MD;  Location: [facility];  Service: Urology;  Laterality: Left;   MANDIBLE SURGERY  1998   metal plate   Social History   Occupational History   Occupation: Unemployed//disabled  Tobacco Use   Smoking status: Never   Smokeless tobacco: Never  Vaping Use   Vaping Use: Never used  Substance and Sexual Activity   Alcohol use: No    Alcohol/week: 0.0 standard drinks   Drug use:  Yes    Types: Marijuana   Sexual activity: Yes    Comment: per pt's blue health form - 1 sex partner in last 12 months

## 2022-02-11 ENCOUNTER — Ambulatory Visit: Payer: Commercial Managed Care - HMO | Admitting: Cardiology

## 2022-02-15 ENCOUNTER — Telehealth: Payer: Self-pay | Admitting: *Deleted

## 2022-02-15 NOTE — Telephone Encounter (Signed)
Please return regarding patients medication requirements.

## 2022-02-18 NOTE — Progress Notes (Signed)
Erroneous encounter

## 2022-02-18 NOTE — Telephone Encounter (Signed)
Call was returned to patient. However, patient do not have a VMB ti leave msg

## 2022-02-19 ENCOUNTER — Other Ambulatory Visit: Payer: Self-pay | Admitting: Family

## 2022-02-19 DIAGNOSIS — I10 Essential (primary) hypertension: Secondary | ICD-10-CM

## 2022-02-20 ENCOUNTER — Other Ambulatory Visit: Payer: Self-pay

## 2022-02-20 ENCOUNTER — Other Ambulatory Visit: Payer: Self-pay | Admitting: Family

## 2022-02-20 DIAGNOSIS — I10 Essential (primary) hypertension: Secondary | ICD-10-CM

## 2022-02-21 ENCOUNTER — Encounter: Payer: Medicare Other | Admitting: Family

## 2022-02-21 DIAGNOSIS — I10 Essential (primary) hypertension: Secondary | ICD-10-CM

## 2022-02-22 ENCOUNTER — Other Ambulatory Visit: Payer: Self-pay | Admitting: Family

## 2022-02-25 NOTE — Telephone Encounter (Signed)
Schedule appointment?

## 2022-03-12 ENCOUNTER — Ambulatory Visit: Payer: Medicare Other | Admitting: Specialist

## 2022-03-24 ENCOUNTER — Other Ambulatory Visit: Payer: Self-pay | Admitting: Family

## 2022-03-25 NOTE — Telephone Encounter (Signed)
Dose inconsistent with current med list and prescriber not in this practice. ?Requested Prescriptions  ?Pending Prescriptions Disp Refills  ?? buPROPion (WELLBUTRIN XL) 150 MG 24 hr tablet [Pharmacy Med Name: Bupropion Hydrochloride '150mg'$  Extended-Release (XL) Tablet] 30 tablet 11  ?  Sig: Take 1 tablet by mouth every day  ?  ? Psychiatry: Antidepressants - bupropion Failed - 03/24/2022  1:01 AM  ?  ?  Failed - Last BP in normal range  ?  BP Readings from Last 1 Encounters:  ?02/06/22 (!) 165/117  ?   ?  ?  Passed - Cr in normal range and within 360 days  ?  Creat  ?Date Value Ref Range Status  ?06/03/2016 1.07 0.60 - 1.35 mg/dL Final  ? ?Creatinine, Ser  ?Date Value Ref Range Status  ?06/29/2021 1.09 0.61 - 1.24 mg/dL Final  ?   ?  ?  Passed - AST in normal range and within 360 days  ?  AST  ?Date Value Ref Range Status  ?06/29/2021 16 15 - 41 U/L Final  ?   ?  ?  Passed - ALT in normal range and within 360 days  ?  ALT  ?Date Value Ref Range Status  ?06/29/2021 14 0 - 44 U/L Final  ?   ?  ?  Passed - Completed PHQ-2 or PHQ-9 in the last 360 days  ?  ?  Passed - Valid encounter within last 6 months  ?  Recent Outpatient Visits   ?      ? 3 months ago Acute non-recurrent maxillary sinusitis  ? [facility] [place], NP  ? 4 months ago Chronic cough  ? [facility] at [facility], Amy J, NP  ? 7 months ago Encounter for weight management  ? [facility] at [facility], Amy J, NP  ? 8 months ago Encounter to establish care  ? [facility] at [facility] [place], NP  ? 1 year ago Dysuria  ? [facility], Delfino Lovett, NP  ?  ?  ?Future Appointments   ?        ? In 1 week Jessy Oto, MD [place]  ?  ? ?  ?  ?  ? ? ?

## 2022-04-03 ENCOUNTER — Encounter: Payer: Self-pay | Admitting: Specialist

## 2022-04-03 ENCOUNTER — Ambulatory Visit: Payer: Self-pay

## 2022-04-03 ENCOUNTER — Ambulatory Visit (INDEPENDENT_AMBULATORY_CARE_PROVIDER_SITE_OTHER): Payer: Medicare Other | Admitting: Specialist

## 2022-04-03 VITALS — BP 155/108 | HR 91 | Ht 70.0 in | Wt 269.0 lb

## 2022-04-03 DIAGNOSIS — M79672 Pain in left foot: Secondary | ICD-10-CM | POA: Diagnosis not present

## 2022-04-03 DIAGNOSIS — M5416 Radiculopathy, lumbar region: Secondary | ICD-10-CM | POA: Diagnosis not present

## 2022-04-03 DIAGNOSIS — M545 Low back pain, unspecified: Secondary | ICD-10-CM

## 2022-04-03 DIAGNOSIS — M25562 Pain in left knee: Secondary | ICD-10-CM

## 2022-04-03 DIAGNOSIS — M5136 Other intervertebral disc degeneration, lumbar region: Secondary | ICD-10-CM

## 2022-04-03 DIAGNOSIS — G8929 Other chronic pain: Secondary | ICD-10-CM

## 2022-04-03 NOTE — Patient Instructions (Signed)
Avoid frequent bending and stooping  ?No lifting greater than 10 lbs. ?May use ice or moist heat for pain. ?Weight loss is of benefit. ?Best medication for lumbar disc disease is arthritis medications like motrin, celebrex and naprosyn. ?Exercise is important to improve your indurance and does allow people to function better inspite of back pain.  ?Knee is suffering from osteoarthritis, only real proven treatments are ?Weight loss, NSIADs like diclofenac and exercise. ?Well padded shoes help. ?Ice the knee 2-3 times a day 15-20 mins at a time. ?MRi of the lumbar spine is ordered ?Part of your condition is the knee itself. ?Should see how the injection of the knee does and then decide on intervention for the lumbar spine. ?

## 2022-04-03 NOTE — Progress Notes (Signed)
? ?Office Visit Note ?  ?Patient: Michael [name]           ?Date of Birth: 1971/04/16           ?MRN: [id number] ?Visit Date: 04/03/2022 ?             ?Requested by: [name], NP ?[address] ?[address] ?[place],  [place] [address] ?PCP: [name], NP ? ? ?Assessment & Plan: ?Visit Diagnoses:  ?1. Other intervertebral disc degeneration, lumbar region   ?2. Lumbar radiculopathy   ?3. Intractable left heel pain   ?4. Acute pain of left knee   ?5. Chronic low back pain, unspecified back pain laterality, unspecified whether sciatica present   ? ? ?Plan: Avoid frequent bending and stooping  ?No lifting greater than 10 lbs. ?May use ice or moist heat for pain. ?Weight loss is of benefit. ?Best medication for lumbar disc disease is arthritis medications like motrin, celebrex and naprosyn. ?Exercise is important to improve your indurance and does allow people to function better inspite of back pain.  ?Knee is suffering from osteoarthritis, only real proven treatments are ?Weight loss, NSIADs like diclofenac and exercise. ?Well padded shoes help. ?Ice the knee 2-3 times a day 15-20 mins at a time. ?MRi of the lumbar spine is ordered ?Part of your condition is the knee itself. ?Should see how the injection of the knee does and then decide on intervention for the lumbar spine. ? ?Follow-Up Instructions: Return in about 3 weeks (around 04/24/2022).  ? ?Orders:  ?Orders Placed This Encounter  ?Procedures  ? XR Knee 1-2 Views Left  ? MR Lumbar Spine w/o contrast  ? ?No orders of the defined types were placed in this encounter. ? ? ? ? Procedures: ?No procedures performed ? ? ?Clinical Data: ?No additional findings. ? ? ?Subjective: ?Chief Complaint  ?Patient presents with  ? Lower Back - Follow-up  ? ? ?51 year old male with history of back pain and left heel and left knee pain. He started using the voltaren but was unable to continue due to  head aches. No bowel or bladder difficulty. We have not injected the left knee  and it is painful to walk up and down steps is painful over the anterior left knee and he notices stiffness and pain with weightbearing on the left leg. Stopped the diclofenac due to HA. ? ?Review of Systems  ?Constitutional:  Positive for chills. Negative for activity change, appetite change, diaphoresis, fatigue, fever and unexpected weight change.  ?HENT: Negative.    ?Eyes: Negative.   ?Respiratory: Negative.    ?Cardiovascular: Negative.   ?Gastrointestinal: Negative.   ?Endocrine: Negative.   ?Genitourinary: Negative.   ?Musculoskeletal: Negative.   ?Skin: Negative.   ? ? ?Objective: ?Vital Signs: BP (!) 155/108 (BP Location: Left Arm, Patient Position: Sitting)   Pulse 91   Ht '5\' 10"'$  (1.778 m)   Wt 269 lb (122 kg)   BMI 38.60 kg/m?  ? ?Physical Exam ? ?Ortho Exam ? ?Specialty Comments:  ?No specialty comments available. ? ?Imaging: ?No results found. ? ? ?PMFS History: ?Patient Active Problem List  ? Diagnosis Date Noted  ? Acute non-recurrent maxillary sinusitis 12/05/2021  ? CHF (congestive heart failure), NYHA class I, acute, diastolic ([place]) 20/94/7096  ? Central sleep apnea comorbid with prescribed opioid use 09/21/2019  ? History of uncontrolled hypertension 09/21/2019  ? Complex sleep apnea syndrome 08/18/2019  ? Claustrophobia 08/18/2019  ? Anxiety associated with depression 08/18/2019  ?  Sleep disturbance 10/01/2018  ? Low HDL (under 40) 05/11/2017  ? Moderate major depression ([place]) 05/11/2017  ? Hypokalemia 04/04/2017  ? Morbid obesity ([place]) 04/04/2017  ? Chest wall pain 04/04/2017  ? Asthma 02/13/2016  ? Non compliance with medical treatment 06/13/2015  ? OSA (obstructive sleep apnea) 04/06/2013  ? Abnormal EKG 11/08/2012  ? Essential hypertension   ? HTN (hypertension), malignant 04/29/2012  ? Migraine headache with aura 04/29/2012  ? ?Past Medical History:  ?Diagnosis Date  ? Anxiety   ? Depression   ? Questionable per records. Not on any medication.   ? History of kidney stones   ? Hx of  echocardiogram   ? a. Echo 12/13/12: Mild LVH, EF 37-16%, grade 1 diastolic dysfunction, mild LAE, mild RVE, mild RAE  ? Hypertension   ? Insomnia   ? Migraine   ? Noncompliance   ? OSA (obstructive sleep apnea)   ? a. Sleep Study 02/2013:  mod OSA, AHI 33 per hour, O2 sat nadir 85%  ?  ?Family History  ?Problem Relation Age of Onset  ? Cancer Father   ?     prostate, colon- age was in his 3's  ? Heart disease Father 64  ?     MI   ? Hypertension Father   ? Colon cancer Father   ? Diabetes Mother   ? Hypertension Mother   ? Hypertension Brother   ? Cancer Maternal Grandfather   ? Cancer Paternal Grandfather   ? Esophageal cancer Neg Hx   ? Stomach cancer Neg Hx   ? Rectal cancer Neg Hx   ?  ?Past Surgical History:  ?Procedure Laterality Date  ? Admission  04/2012  ? Malignant HTN, HA.  CT head negative, cardiology consult.    ? CYSTOSCOPY WITH RETROGRADE PYELOGRAM, URETEROSCOPY AND STENT PLACEMENT Left 01/24/2019  ? Procedure: CYSTOSCOPY WITH RETROGRADE PYELOGRAM, URETEROSCOPY AND STENT PLACEMENT;  Surgeon: Cleon Gustin, MD;  Location: [facility];  Service: Urology;  Laterality: Left;  ? HOLMIUM LASER APPLICATION Left 9/67/8938  ? Procedure: HOLMIUM LASER APPLICATION;  Surgeon: Cleon Gustin, MD;  Location: [facility];  Service: Urology;  Laterality: Left;  ? [place]  ? metal plate  ? ?Social History  ? ?Occupational History  ? Occupation: Unemployed//disabled  ?Tobacco Use  ? Smoking status: Never  ? Smokeless tobacco: Never  ?Vaping Use  ? Vaping Use: Never used  ?Substance and Sexual Activity  ? Alcohol use: No  ?  Alcohol/week: 0.0 standard drinks  ? Drug use: Yes  ?  Types: Marijuana  ? Sexual activity: Yes  ?  Comment: per pt's blue health form - 1 sex partner in last 12 months  ? ? ? ? ? ? ?

## 2022-04-18 ENCOUNTER — Ambulatory Visit: Payer: Medicaid Other | Admitting: Surgery

## 2022-04-23 ENCOUNTER — Encounter: Payer: Self-pay | Admitting: *Deleted

## 2022-05-15 ENCOUNTER — Ambulatory Visit: Payer: Medicare Other | Admitting: Cardiology

## 2022-05-15 ENCOUNTER — Encounter: Payer: Self-pay | Admitting: Specialist

## 2022-05-15 ENCOUNTER — Ambulatory Visit (INDEPENDENT_AMBULATORY_CARE_PROVIDER_SITE_OTHER): Payer: Medicare Other | Admitting: Specialist

## 2022-05-15 VITALS — BP 156/120 | HR 73 | Ht 70.0 in | Wt 269.0 lb

## 2022-05-15 DIAGNOSIS — G8929 Other chronic pain: Secondary | ICD-10-CM

## 2022-05-15 DIAGNOSIS — M545 Low back pain, unspecified: Secondary | ICD-10-CM

## 2022-05-15 DIAGNOSIS — M5136 Other intervertebral disc degeneration, lumbar region: Secondary | ICD-10-CM

## 2022-05-15 DIAGNOSIS — M5126 Other intervertebral disc displacement, lumbar region: Secondary | ICD-10-CM

## 2022-05-15 DIAGNOSIS — M79672 Pain in left foot: Secondary | ICD-10-CM

## 2022-05-15 DIAGNOSIS — M25562 Pain in left knee: Secondary | ICD-10-CM

## 2022-05-15 DIAGNOSIS — M5416 Radiculopathy, lumbar region: Secondary | ICD-10-CM | POA: Diagnosis not present

## 2022-05-15 DIAGNOSIS — M79641 Pain in right hand: Secondary | ICD-10-CM

## 2022-05-15 DIAGNOSIS — M79642 Pain in left hand: Secondary | ICD-10-CM

## 2022-05-15 MED ORDER — TRAMADOL-ACETAMINOPHEN 37.5-325 MG PO TABS: 1.0000 | ORAL_TABLET | Freq: Four times a day (QID) | ORAL | 0 refills | Status: DC | PRN

## 2022-05-15 NOTE — Patient Instructions (Addendum)
Plan: Avoid frequent bending and stooping  No lifting greater than 10 lbs. May use ice or moist heat for pain. Weight loss is of benefit. Best medication for lumbar disc disease is arthritis medications like motrin, celebrex and naprosyn. Exercise is important to improve your indurance and does allow people to function better inspite of back pain.  Knee is suffering from osteoarthritis, only real proven treatments are Weight loss, NSIADs like diclofenac and exercise. Well padded shoes help. Ice the knee 2-3 times a day 15-20 mins at a time. MRi of the lumbar spine is ordered Part of your condition is the knee itself. Should see how the injection of the knee does and then decide on intervention for the lumbar spine. Avoid frequent bending and stooping  No lifting greater than 10 lbs. May use ice or moist heat for pain. Weight loss is of benefit. Best medication for lumbar disc disease is arthritis medications like motrin, celebrex and naprosyn. Exercise is important to improve your indurance and does allow people to function better inspite of back pain. Avoid bending, stooping and avoid lifting weights greater than 10 lbs. Avoid prolong standing and walking. Order for a new walker with wheels. Surgery scheduling secretary Kandice Hams, will call you in the next week to schedule for surgery.  Surgery recommended is a two level lumbar fusion L4-5 and L5-S1 this would be done with rods, screws and cages with local bone graft and allograft (donor bone graft). Take hydrocodone for for pain. Risk of surgery includes risk of infection 1 in 200 patients, bleeding 1/2% chance you would need a transfusion.   Risk to the nerves is one in 10,000. You will need to use a brace for 3 months and wean from the brace on the 4th month. Expect improved walking and standing tolerance. Expect relief of leg pain but numbness may persist depending on the length and degree of pressure that has been present

## 2022-05-15 NOTE — Progress Notes (Signed)
Office Visit Note   Patient: Michael [name]           Date of Birth: 1971-04-13           MRN: [id number] Visit Date: 05/15/2022              Requested by: [name], NP [name],  [place] [address] PCP: [name], NP   Assessment & Plan: Visit Diagnoses:  1. Lumbar radiculopathy   2. Lumbar disc herniation   3. Other intervertebral disc degeneration, lumbar region   4. Chronic low back pain, unspecified back pain laterality, unspecified whether sciatica present   5. Intractable left heel pain   6. Acute pain of left knee    Plan: Avoid frequent bending and stooping  No lifting greater than 10 lbs. May use ice or moist heat for pain. Weight loss is of benefit. Best medication for lumbar disc disease is arthritis medications like motrin, celebrex and naprosyn. Exercise is important to improve your indurance and does allow people to function better inspite of back pain.  Knee is suffering from osteoarthritis, only real proven treatments are Weight loss, NSIADs like diclofenac and exercise. Well padded shoes help. Ice the knee 2-3 times a day 15-20 mins at a time. MRi of the lumbar spine is ordered Part of your condition is the knee itself. Should see how the injection of the knee does and then decide on intervention for the lumbar spine. Avoid frequent bending and stooping  No lifting greater than 10 lbs. May use ice or moist heat for pain. Weight loss is of benefit. Best medication for lumbar disc disease is arthritis medications like motrin, celebrex and naprosyn. Exercise is important to improve your indurance and does allow people to function better inspite of back pain. Avoid bending, stooping and avoid lifting weights greater than 10 lbs. Avoid prolong standing and walking. Order for a new walker with wheels. Surgery scheduling secretary Kandice Hams, will call you in the next week to schedule for surgery.  Surgery recommended is a  two level lumbar fusion L4-5 and L5-S1 this would be done with rods, screws and cages with local bone graft and allograft (donor bone graft). Take hydrocodone for for pain. Risk of surgery includes risk of infection 1 in 200 patients, bleeding 1/2% chance you would need a transfusion.   Risk to the nerves is one in 10,000. You will need to use a brace for 3 months and wean from the brace on the 4th month. Expect improved walking and standing tolerance. Expect relief of leg pain but numbness may persist depending on the length and degree of pressure that has been present  Follow-Up Instructions: No follow-ups on file.   Orders:  No orders of the defined types were placed in this encounter.  No orders of the defined types were placed in this encounter.     Procedures: No procedures performed   Clinical Data: No additional findings.   Subjective: Chief Complaint  Patient presents with   Left Knee - Follow-up   Lower Back - Follow-up    51 year old male with history of back pain and pain into the back of the legs, worse with walking steps and with bending and stooping and twisting. Pain for years and stopped working about one year ago while seeing Dr. Junius Roads. He was last seen 01/2022 and after review of his record and degree of impairment I believe a 2 level lumbar fusion is  likely the best  Intervention that may show an improvement in his pain pattern and ability to function. Reports that his pain is present and is so bad that he nearly crawls to the bathroom and that he asked his brother to take him to the ER yesterday but his brother declined. No bowel or bladder difficulties.    Review of Systems  Constitutional: Negative.   HENT: Negative.    Eyes: Negative.   Respiratory: Negative.    Cardiovascular: Negative.   Gastrointestinal: Negative.   Endocrine: Negative.   Genitourinary: Negative.   Musculoskeletal: Negative.   Skin: Negative.   Allergic/Immunologic: Negative.    Neurological: Negative.   Hematological: Negative.   Psychiatric/Behavioral: Negative.      Objective: Vital Signs: BP (!) 156/120 (BP Location: Left Arm, Patient Position: Sitting)   Pulse 73   Ht '5\' 10"'$  (1.778 m)   Wt 269 lb (122 kg)   BMI 38.60 kg/m   Physical Exam Constitutional:      Appearance: He is well-developed.  HENT:     Head: Normocephalic and atraumatic.  Eyes:     Pupils: Pupils are equal, round, and reactive to light.  Pulmonary:     Effort: Pulmonary effort is normal.     Breath sounds: Normal breath sounds.  Abdominal:     General: Bowel sounds are normal.     Palpations: Abdomen is soft.  Musculoskeletal:     Cervical back: Normal range of motion and neck supple.     Lumbar back: Negative right straight leg raise test and negative left straight leg raise test.  Skin:    General: Skin is warm and dry.  Neurological:     Mental Status: He is alert and oriented to person, place, and time.  Psychiatric:        Behavior: Behavior normal.        Thought Content: Thought content normal.        Judgment: Judgment normal.    Back Exam   Tenderness  The patient is experiencing tenderness in the lumbar.  Range of Motion  Extension:  abnormal  Flexion:  abnormal  Rotation right:  abnormal  Rotation left:  abnormal   Muscle Strength  Right Quadriceps:  5/5  Left Quadriceps:  5/5  Right Hamstrings:  5/5  Left Hamstrings:  5/5   Tests  Straight leg raise right: negative Straight leg raise left: negative  Reflexes  Patellar:  2/4 Achilles:  2/4 Babinski's sign: normal   Other  Toe walk: normal Heel walk: normal Sensation: normal  Comments:  Complains of hand pain especially the right little finger PIP joint and the base of the left thumb MCP joint.     Specialty Comments:  No specialty comments available.  Imaging: No results found.   PMFS History: Patient Active Problem List   Diagnosis Date Noted   Acute non-recurrent  maxillary sinusitis 12/05/2021   CHF (congestive heart failure), NYHA class I, acute, diastolic ([place]) 84/16/6063   Central sleep apnea comorbid with prescribed opioid use 09/21/2019   History of uncontrolled hypertension 09/21/2019   Complex sleep apnea syndrome 08/18/2019   Claustrophobia 08/18/2019   Anxiety associated with depression 08/18/2019   Sleep disturbance 10/01/2018   Low HDL (under 40) 05/11/2017   Moderate major depression ([place]) 05/11/2017   Hypokalemia 04/04/2017   Morbid obesity ([place]) 04/04/2017   Chest wall pain 04/04/2017   Asthma 02/13/2016   Non compliance with medical treatment 06/13/2015   OSA (obstructive sleep apnea)  04/06/2013   Abnormal EKG 11/08/2012   Essential hypertension    HTN (hypertension), malignant 04/29/2012   Migraine headache with aura 04/29/2012   Past Medical History:  Diagnosis Date   Anxiety    Depression    Questionable per records. Not on any medication.    History of kidney stones    Hx of echocardiogram    a. Echo 12/13/12: Mild LVH, EF 44-81%, grade 1 diastolic dysfunction, mild LAE, mild RVE, mild RAE   Hypertension    Insomnia    Migraine    Noncompliance    OSA (obstructive sleep apnea)    a. Sleep Study 02/2013:  mod OSA, AHI 33 per hour, O2 sat nadir 85%    Family History  Problem Relation Age of Onset   Cancer Father        prostate, colon- age was in his 86's   Heart disease Father 57       MI    Hypertension Father    Colon cancer Father    Diabetes Mother    Hypertension Mother    Hypertension Brother    Cancer Maternal Grandfather    Cancer Paternal Grandfather    Esophageal cancer Neg Hx    Stomach cancer Neg Hx    Rectal cancer Neg Hx     Past Surgical History:  Procedure Laterality Date   Admission  04/2012   Malignant HTN, HA.  CT head negative, cardiology consult.     CYSTOSCOPY WITH RETROGRADE PYELOGRAM, URETEROSCOPY AND STENT PLACEMENT Left 01/24/2019   Procedure: CYSTOSCOPY WITH RETROGRADE  PYELOGRAM, URETEROSCOPY AND STENT PLACEMENT;  Surgeon: Cleon Gustin, MD;  Location: [facility];  Service: Urology;  Laterality: Left;   HOLMIUM LASER APPLICATION Left 8/56/3149   Procedure: HOLMIUM LASER APPLICATION;  Surgeon: Cleon Gustin, MD;  Location: [facility];  Service: Urology;  Laterality: Left;   MANDIBLE SURGERY  1998   metal plate   Social History   Occupational History   Occupation: Unemployed//disabled  Tobacco Use   Smoking status: Never   Smokeless tobacco: Never  Vaping Use   Vaping Use: Never used  Substance and Sexual Activity   Alcohol use: No    Alcohol/week: 0.0 standard drinks   Drug use: Yes    Types: Marijuana   Sexual activity: Yes    Comment: per pt's blue health form - 1 sex partner in last 12 months

## 2022-05-19 ENCOUNTER — Telehealth: Payer: Self-pay | Admitting: *Deleted

## 2022-05-19 LAB — URIC ACID: Uric Acid, Serum: 6.7 mg/dL (ref 4.0–8.0)

## 2022-05-19 LAB — RHEUMATOID FACTOR: Rheumatoid fact SerPl-aCnc: <14 IU/mL (ref <14)

## 2022-05-19 LAB — SEDIMENTATION RATE: Sed Rate: 11 mm/h (ref 0–20)

## 2022-05-19 LAB — ANA: Anti Nuclear Antibody (ANA): NEGATIVE

## 2022-05-19 NOTE — Telephone Encounter (Signed)
   Pre-operative Risk Assessment    Patient Name: Michael [name]  DOB: 1971/03/06 MRN: [id number]    PT LAST SEEN BY CARDIOLOGY 2018; PT HAS NEW PT APPT WITH DR. Radford Pax 06/12/22.   Request for Surgical Clearance    Procedure:   LUMBAR FUSION  Date of Surgery:  Clearance TBD                                Surgeon:  DR. Basil Dess Surgeon's Group or Practice Name:  [facility] Phone number:  [phone] Fax number:  [phone]   Type of Clearance Requested:   - Medical ; NO MEDICATIONS LISTED TO BE HELD    Type of Anesthesia:  General    Additional requests/questions:    [name]   05/19/2022, 4:36 PM

## 2022-05-30 ENCOUNTER — Ambulatory Visit
Admission: RE | Admit: 2022-05-30 | Discharge: 2022-05-30 | Disposition: A | Discharge status: Home / Self Care | Payer: Medicaid Other | Source: Ambulatory Visit | Attending: Specialist | Admitting: Specialist

## 2022-05-30 DIAGNOSIS — M79642 Pain in left hand: Secondary | ICD-10-CM

## 2022-05-30 DIAGNOSIS — M79672 Pain in left foot: Secondary | ICD-10-CM

## 2022-05-30 DIAGNOSIS — M79641 Pain in right hand: Secondary | ICD-10-CM

## 2022-05-30 IMAGING — MR MR LUMBAR SPINE W/O CM
4 of 5 series · 17 of 48 positions shown · non-contrast
Comparison: Lumbar spine radiographs [DATE]; MRI lumbar spine
[DATE]

CLINICAL DATA: Lumbar radiculopathy. Symptoms for cyst with
greatest 6 weeks treatment. Back spasms for years. No prior surgery.
History of injection 1 month ago.

EXAM:
MRI LUMBAR SPINE WITHOUT CONTRAST
TECHNIQUE: Multiplanar, multisequence MR imaging of the lumbar spine was
performed. No intravenous contrast was administered.

[Series 6: T2 · sagittal · 4.0mm · 0.73mm/px · 6 of 15 slices shown (1 of 2)]
[im 1/15]
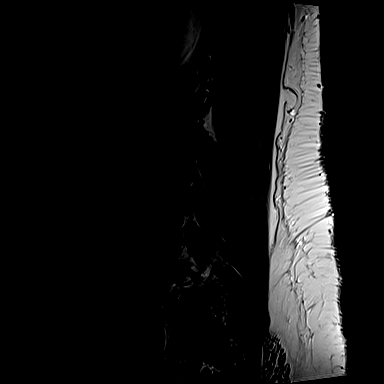
[im 3/15]
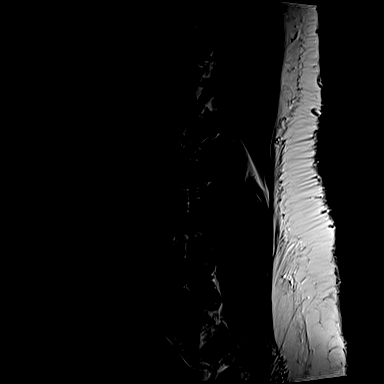
[im 6/15]
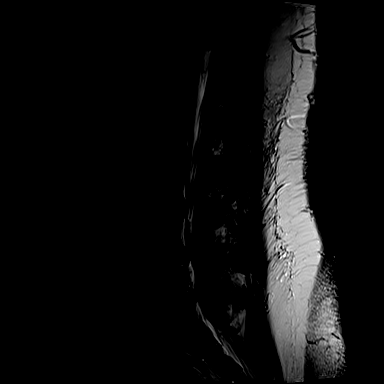
[im 9/15]
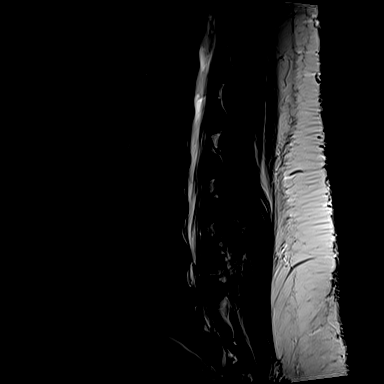
[im 12/15]
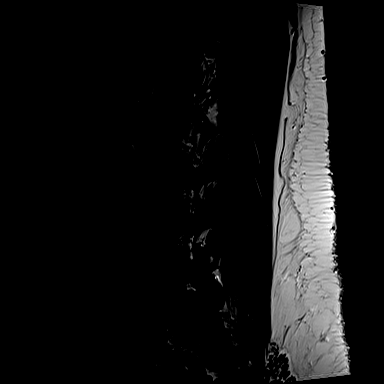
[im 15/15]
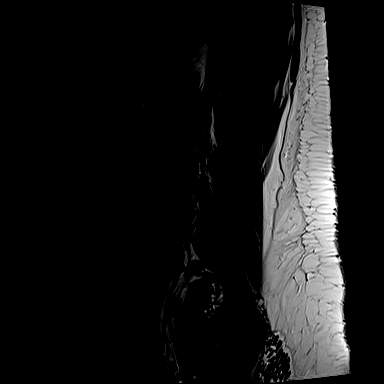

[Series 7: T1 · sagittal · 4.0mm · 0.73mm/px · 3 of 15 slices shown (1 of 2)]
[im 3/15]
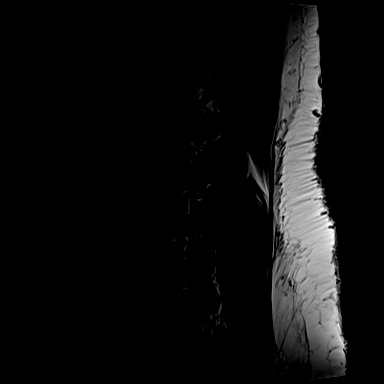
[im 9/15]
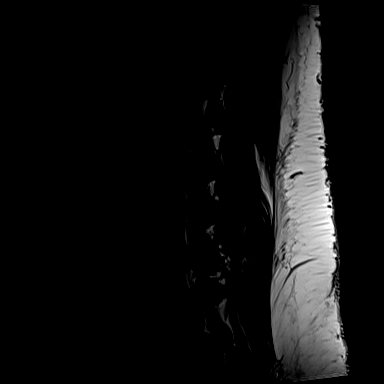
[im 15/15]
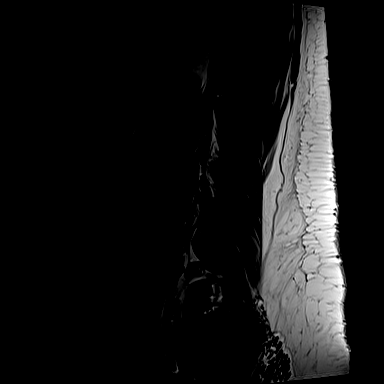

[Series 11: T1 · axial · 4.0mm · 0.34mm/px · z∈[+24,+205]mm · 3 of 41 slices shown (2 of 2)]
[im 6/41]
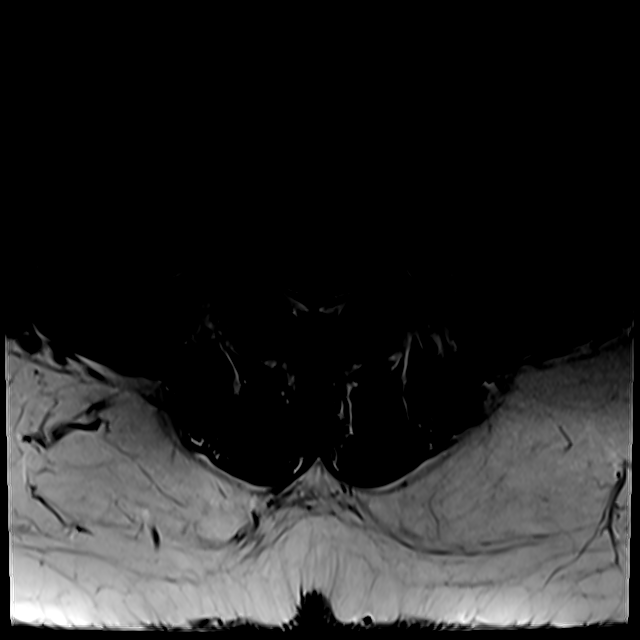
[im 21/41]
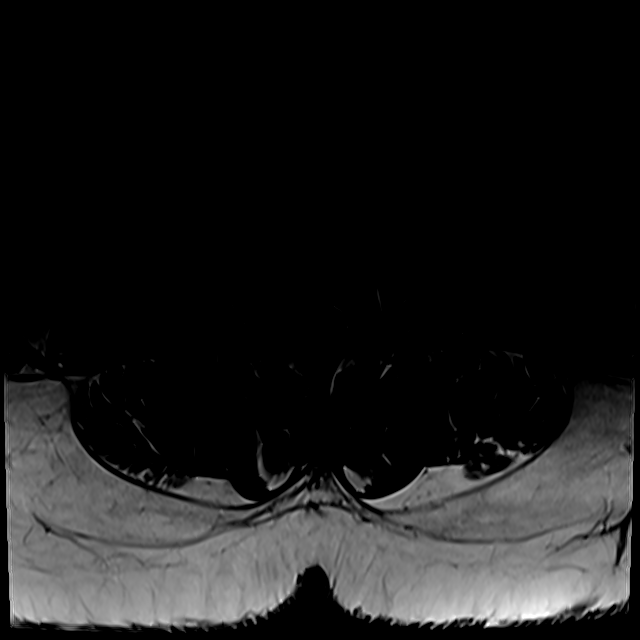
[im 35/41]
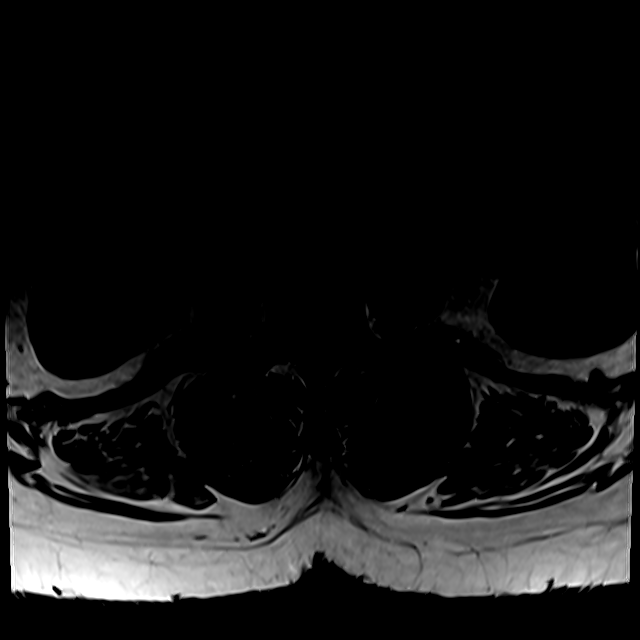

[Series 14: T2 · axial · 4.0mm · 0.34mm/px · z∈[-0,+205]mm · 5 of 41 slices shown (2 of 2)]
[im 1/41]
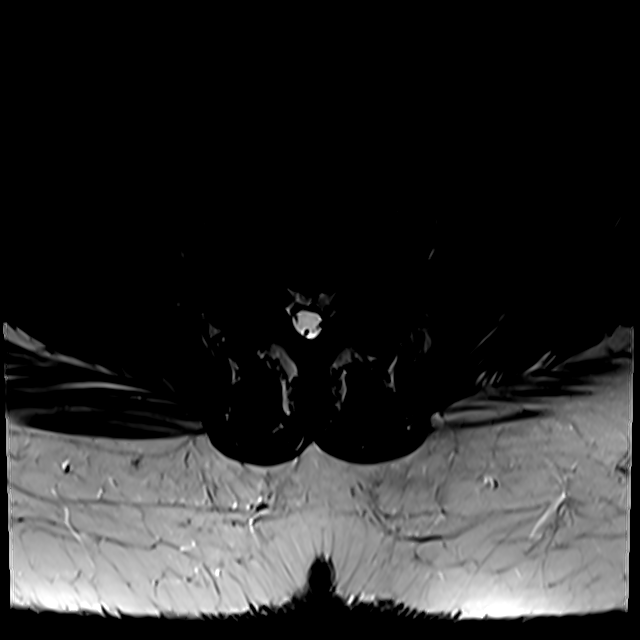
[im 6/41]
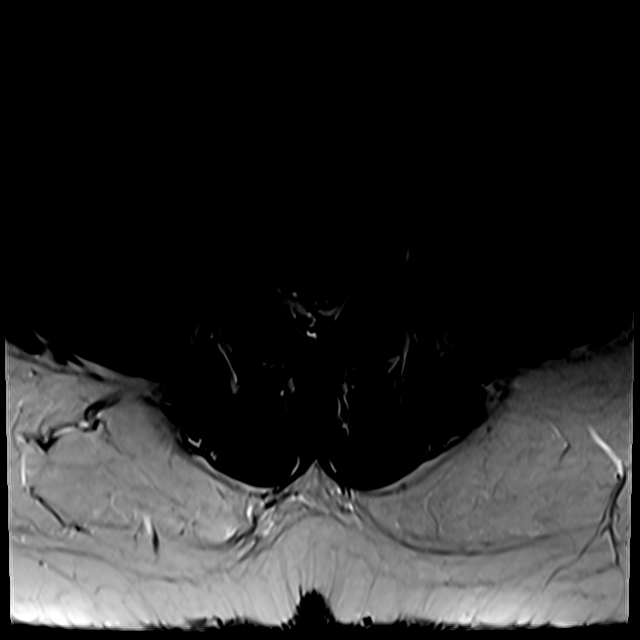
[im 12/41]
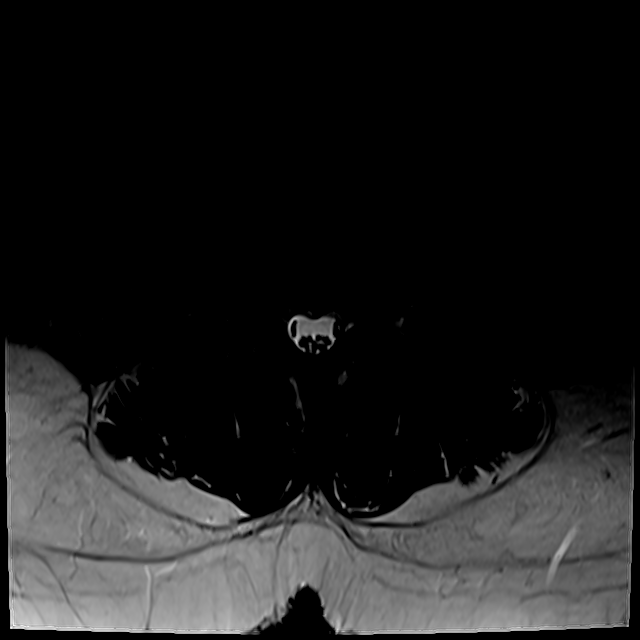
[im 21/41]
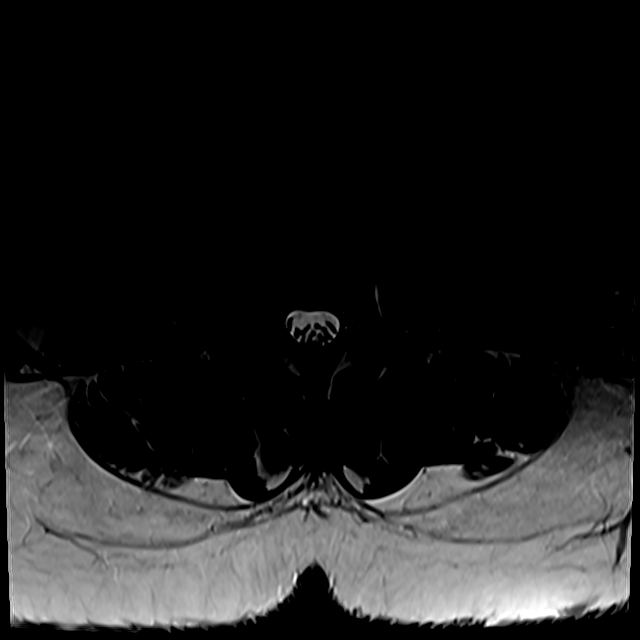
[im 35/41]
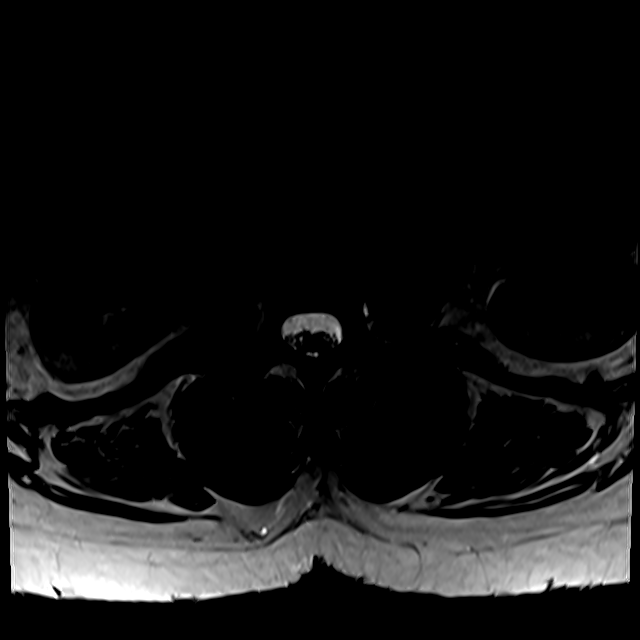

[17 of 48 positions shown; findings below may reference images not displayed]

FINDINGS: Segmentation:  Standard.

Alignment:  No sagittal spondylolisthesis.

Vertebrae: Vertebral body heights are maintained. Mild-to-moderate
posterior L5-S1 disc space narrowing is minimally worsened from
[DATE]. Small anterior inferior left L5 endplate cyst is
unchanged. Interval decrease in now mild marrow edema throughout the
majority of the L5 vertebral body with slight interval increase in
moderate diffuse inferior greater than superior L5 vertebral body
chronic fat intensity marrow changes. No acute fracture or
destructive bone lesion.

Conus medullaris and cauda equina: Conus extends to the L1-2 level.
Conus and cauda equina appear normal.

Paraspinal and other soft tissues: The kidneys are partially
visualized. There are multiple decreased T1 and increased T2 signal
bilateral renal cysts that are similar to prior. Incidental note of
normal variant retroaortic left renal vein.

Disc levels:

T12-L1: Unremarkable.

L1-2: Minimal extension of disc into the inferior aspect of the
bilateral neural foramina. No central canal or neuroforaminal
stenosis. No significant change.

L2-3: Mild bilateral facet joint hypertrophy. Mild broad-based
posterior disc bulge and endplate spurring. No central canal
stenosis. No neuroforaminal stenosis. No significant change.

L3-4: Mild bilateral facet joint hypertrophy. Mild broad-based
posterior disc osteophyte complex with left greater than right
intraforaminal extension. Borderline mild left neuroforaminal
stenosis, unchanged. No central canal stenosis.

L4-5: Moderate bilateral facet joint hypertrophy. Small
left-greater-than-right facet joint fusions, similar to prior.
Mild-to-moderate broad-based posterior disc osteophyte complex with
bilateral intraforaminal extension. Mild bilateral neuroforaminal
stenosis, unchanged. Mild narrowing of the lateral recesses and
borderline mild central canal stenosis along the inferior L4-5 level
and superior L5 vertebral body, unchanged.

L5-S1: Moderate bilateral facet joint hypertrophy. Moderate
broad-based posterior disc osteophyte complex with moderate
left-greater-than-right intraforaminal extension midline posterior
disc protrusion measures up to 6 mm in AP dimension. This appears
unchanged on axial images but may be minimally increased from 5 mm
to 6 mm on sagittal images. Alternatively this change may be due to
differences in slice selection on the sagittal sequence. Moderate to
severe left and moderate right lateral recess stenosis and
mild-to-moderate left-greater-than-right central canal stenosis,
unchanged. Moderate left and mild-to-moderate right neuroforaminal
stenosis, unchanged.
IMPRESSION: Compared to [DATE]:

1. Multilevel degenerative disc and joint changes as above.
2. Mild L4-5 lateral recess and central canal stenosis and L5-S1
moderate to severe left and moderate right lateral recess stenosis
mild-to-moderate left-greater-than-right central canal stenosis.
3. Multilevel neuroforaminal stenosis including mild bilateral L4-5
and moderate left and mild-to-moderate right L5-S1 neuroforaminal
stenosis, unchanged.

## 2022-06-05 ENCOUNTER — Other Ambulatory Visit: Payer: Self-pay | Admitting: Specialist

## 2022-06-12 ENCOUNTER — Ambulatory Visit: Payer: Medicare Other | Admitting: Cardiology

## 2022-06-18 ENCOUNTER — Encounter: Payer: Self-pay | Admitting: Specialist

## 2022-06-18 ENCOUNTER — Ambulatory Visit (INDEPENDENT_AMBULATORY_CARE_PROVIDER_SITE_OTHER): Payer: Medicare Other | Admitting: Specialist

## 2022-06-18 VITALS — BP 144/97 | HR 73 | Ht 70.0 in | Wt 269.0 lb

## 2022-06-18 DIAGNOSIS — G8929 Other chronic pain: Secondary | ICD-10-CM | POA: Diagnosis not present

## 2022-06-18 DIAGNOSIS — M5416 Radiculopathy, lumbar region: Secondary | ICD-10-CM

## 2022-06-18 DIAGNOSIS — M545 Low back pain, unspecified: Secondary | ICD-10-CM | POA: Diagnosis not present

## 2022-06-18 DIAGNOSIS — M5126 Other intervertebral disc displacement, lumbar region: Secondary | ICD-10-CM

## 2022-06-18 MED ORDER — ALLOPURINOL 100 MG PO TABS: 100.0000 mg | ORAL_TABLET | Freq: Every day | ORAL | Status: DC

## 2022-06-18 MED ORDER — ALLOPURINOL 100 MG PO TABS: 100.0000 mg | ORAL_TABLET | Freq: Every day | ORAL | Status: AC

## 2022-06-18 NOTE — Addendum Note (Signed)
Addended by: Minda Ditto, Alyse Low N on: 06/18/2022 10:18 AM   Modules accepted: Orders

## 2022-06-18 NOTE — Progress Notes (Signed)
Office Visit Note   Patient: Michael [name]           Date of Birth: 18-Apr-1971           MRN: [id number] Visit Date: 06/18/2022              Requested by: [name], NP [place],  [place] [address] PCP: [name], NP   Assessment & Plan: Visit Diagnoses:  1. Lumbar disc herniation   2. Lumbar radiculopathy   3. Chronic low back pain, unspecified back pain laterality, unspecified whether sciatica present     Plan: Avoid bending, stooping and avoid lifting weights greater than 10 lbs. Avoid prolong standing and walking. Order for a new walker with wheels. Surgery scheduling secretary Kandice Hams, will call you in the next week to schedule for surgery.  Surgery recommended is a one level lumbar fusion L5-S1 this would be done with rods, screws and cages with local bone graft and allograft (donor bone graft). Take hydrocodone for for pain. Risk of surgery includes risk of infection 1 in 200 patients, bleeding 1/2% chance you would need a transfusion.   Risk to the nerves is one in 10,000. You will need to use a brace for 3 months and wean from the brace on the 4th month. Expect improved walking and standing tolerance. Expect relief of leg pain but numbness may persist depending on the length and degree of pressure that has been present    Follow-Up Instructions: Return in about 4 weeks (around 07/16/2022).   Orders:  No orders of the defined types were placed in this encounter.  No orders of the defined types were placed in this encounter.     Procedures: No procedures performed   Clinical Data: Findings:  CLINICAL DATA: Lumbar radiculopathy. Symptoms for cyst with greatest 6 weeks treatment. Back spasms for years. No prior surgery. History of injection 1 month ago.  EXAM: MRI LUMBAR SPINE WITHOUT CONTRAST  TECHNIQUE: Multiplanar, multisequence MR imaging of the lumbar spine was performed. No intravenous contrast was  administered.  COMPARISON: Lumbar spine radiographs 01/10/2022; MRI lumbar spine 04/03/2021  FINDINGS: Segmentation: Standard.  Alignment: No sagittal spondylolisthesis.  Vertebrae: Vertebral body heights are maintained. Mild-to-moderate posterior L5-S1 disc space narrowing is minimally worsened from 04/03/2021. Small anterior inferior left L5 endplate cyst is unchanged. Interval decrease in now mild marrow edema throughout the majority of the L5 vertebral body with slight interval increase in moderate diffuse inferior greater than superior L5 vertebral body chronic fat intensity marrow changes. No acute fracture or destructive bone lesion.  Conus medullaris and cauda equina: Conus extends to the L1-2 level. Conus and cauda equina appear normal.  Paraspinal and other soft tissues: The kidneys are partially visualized. There are multiple decreased T1 and increased T2 signal bilateral renal cysts that are similar to prior. Incidental note of normal variant retroaortic left renal vein.  Disc levels:  T12-L1: Unremarkable.  L1-2: Minimal extension of disc into the inferior aspect of the bilateral neural foramina. No central canal or neuroforaminal stenosis. No significant change.  L2-3: Mild bilateral facet joint hypertrophy. Mild broad-based posterior disc bulge and endplate spurring. No central canal stenosis. No neuroforaminal stenosis. No significant change.  L3-4: Mild bilateral facet joint hypertrophy. Mild broad-based posterior disc osteophyte complex with left greater than right intraforaminal extension. Borderline mild left neuroforaminal stenosis, unchanged. No central canal stenosis.  L4-5: Moderate bilateral facet joint hypertrophy. Small left-greater-than-right facet joint fusions, similar to prior.  Mild-to-moderate broad-based posterior disc osteophyte complex with bilateral intraforaminal extension. Mild bilateral neuroforaminal stenosis, unchanged. Mild  narrowing of the lateral recesses and borderline mild central canal stenosis along the inferior L4-5 level and superior L5 vertebral body, unchanged.  L5-S1: Moderate bilateral facet joint hypertrophy. Moderate broad-based posterior disc osteophyte complex with moderate left-greater-than-right intraforaminal extension midline posterior disc protrusion measures up to 6 mm in AP dimension. This appears unchanged on axial images but may be minimally increased from 5 mm to 6 mm on sagittal images. Alternatively this change may be due to differences in slice selection on the sagittal sequence. Moderate to severe left and moderate right lateral recess stenosis and mild-to-moderate left-greater-than-right central canal stenosis, unchanged. Moderate left and mild-to-moderate right neuroforaminal stenosis, unchanged.  IMPRESSION: Compared to 04/03/2021:  1. Multilevel degenerative disc and joint changes as above. 2. Mild L4-5 lateral recess and central canal stenosis and L5-S1 moderate to severe left and moderate right lateral recess stenosis mild-to-moderate left-greater-than-right central canal stenosis. 3. Multilevel neuroforaminal stenosis including mild bilateral L4-5 and moderate left and mild-to-moderate right L5-S1 neuroforaminal stenosis, unchanged.   Electronically Signed By: Yvonne Kendall M.D. On: 06/01/2022 15:57    Subjective: Chief Complaint  Patient presents with  . Lower Back - Follow-up    MRI Review    51 year old male with history of CHF and OSA, he has ongoing back pain with occasional pain into the posterior thigh and legs especially with walking. No pain with cough or sneeze.No bowel difficulties but notes increased frequency of urination.  Pain is worse with standing and walking and bending sitting and stooping and riding in a car are painful.    Review of Systems   Objective: Vital Signs: BP (!) 144/97 (BP Location: Left Arm, Patient Position: Sitting)    Pulse 73   Ht '5\' 10"'$  (1.778 m)   Wt 269 lb (122 kg)   BMI 38.60 kg/m   Physical Exam  Ortho Exam  Specialty Comments:  No specialty comments available.  Imaging: No results found.   PMFS History: Patient Active Problem List   Diagnosis Date Noted  . Acute non-recurrent maxillary sinusitis 12/05/2021  . CHF (congestive heart failure), NYHA class I, acute, diastolic ([place]) 84/69/6295  . Central sleep apnea comorbid with prescribed opioid use 09/21/2019  . History of uncontrolled hypertension 09/21/2019  . Complex sleep apnea syndrome 08/18/2019  . Claustrophobia 08/18/2019  . Anxiety associated with depression 08/18/2019  . Sleep disturbance 10/01/2018  . Low HDL (under 40) 05/11/2017  . Moderate major depression ([place]) 05/11/2017  . Hypokalemia 04/04/2017  . Morbid obesity ([place]) 04/04/2017  . Chest wall pain 04/04/2017  . Asthma 02/13/2016  . Non compliance with medical treatment 06/13/2015  . OSA (obstructive sleep apnea) 04/06/2013  . Abnormal EKG 11/08/2012  . Essential hypertension   . HTN (hypertension), malignant 04/29/2012  . Migraine headache with aura 04/29/2012   Past Medical History:  Diagnosis Date  . Anxiety   . Depression    Questionable per records. Not on any medication.   Marland Kitchen History of kidney stones   . Hx of echocardiogram    a. Echo 12/13/12: Mild LVH, EF 28-41%, grade 1 diastolic dysfunction, mild LAE, mild RVE, mild RAE  . Hypertension   . Insomnia   . Left knee pain   . Low back pain   . Migraine   . Noncompliance   . OSA (obstructive sleep apnea)    a. Sleep Study 02/2013:  mod OSA, AHI 33 per  hour, O2 sat nadir 85%    Family History  Problem Relation Age of Onset  . Cancer Father        prostate, colon- age was in his 2's  . Heart disease Father 58       MI   . Hypertension Father   . Colon cancer Father   . Diabetes Mother   . Hypertension Mother   . Hypertension Brother   . Cancer Maternal Grandfather   . Cancer Paternal  Grandfather   . Esophageal cancer Neg Hx   . Stomach cancer Neg Hx   . Rectal cancer Neg Hx     Past Surgical History:  Procedure Laterality Date  . Admission  04/2012   Malignant HTN, HA.  CT head negative, cardiology consult.    . CYSTOSCOPY WITH RETROGRADE PYELOGRAM, URETEROSCOPY AND STENT PLACEMENT Left 01/24/2019   Procedure: CYSTOSCOPY WITH RETROGRADE PYELOGRAM, URETEROSCOPY AND STENT PLACEMENT;  Surgeon: Cleon Gustin, MD;  [facility];  Service: Urology;  Laterality: Left;  . HOLMIUM LASER APPLICATION Left 4/37/3578   Procedure: HOLMIUM LASER APPLICATION;  Surgeon: Cleon Gustin, MD;  Location: [facility];  Service: Urology;  Laterality: Left;  Marland Kitchen MANDIBLE SURGERY  1998   metal plate   Social History   Occupational History  . Occupation: Unemployed//disabled  Tobacco Use  . Smoking status: Never  . Smokeless tobacco: Never  Vaping Use  . Vaping Use: Never used  Substance and Sexual Activity  . Alcohol use: No    Alcohol/week: 0.0 standard drinks of alcohol  . Drug use: Yes    Types: Marijuana  . Sexual activity: Yes    Comment: per pt's blue health form - 1 sex partner in last 12 months

## 2022-06-18 NOTE — Patient Instructions (Signed)
Avoid bending, stooping and avoid lifting weights greater than 10 lbs. Avoid prolong standing and walking. Order for a new walker with wheels. Surgery scheduling secretary Kandice Hams, will call you in the next week to schedule for surgery.  Surgery recommended is a one level lumbar fusion L5-S1 this would be done with rods, screws and cages with local bone graft and allograft (donor bone graft). Take hydrocodone for for pain. Risk of surgery includes risk of infection 1 in 200 patients, bleeding 1/2% chance you would need a transfusion.   Risk to the nerves is one in 10,000. You will need to use a brace for 3 months and wean from the brace on the 4th month. Expect improved walking and standing tolerance. Expect relief of leg pain but numbness may persist depending on the length and degree of pressure that has been present.

## 2022-07-23 ENCOUNTER — Encounter: Payer: Self-pay | Admitting: Specialist

## 2022-07-23 ENCOUNTER — Ambulatory Visit: Payer: Self-pay

## 2022-07-23 ENCOUNTER — Ambulatory Visit (INDEPENDENT_AMBULATORY_CARE_PROVIDER_SITE_OTHER): Payer: Medicare Other | Admitting: Specialist

## 2022-07-23 VITALS — BP 160/110 | HR 69 | Ht 70.0 in | Wt 269.0 lb

## 2022-07-23 DIAGNOSIS — S471XXS Crushing injury of right shoulder and upper arm, sequela: Secondary | ICD-10-CM | POA: Diagnosis not present

## 2022-07-23 DIAGNOSIS — R200 Anesthesia of skin: Secondary | ICD-10-CM | POA: Diagnosis not present

## 2022-07-23 DIAGNOSIS — M24541 Contracture, right hand: Secondary | ICD-10-CM | POA: Diagnosis not present

## 2022-07-23 DIAGNOSIS — M4802 Spinal stenosis, cervical region: Secondary | ICD-10-CM

## 2022-07-23 DIAGNOSIS — M79642 Pain in left hand: Secondary | ICD-10-CM

## 2022-07-23 DIAGNOSIS — R202 Paresthesia of skin: Secondary | ICD-10-CM

## 2022-07-23 NOTE — Patient Instructions (Signed)
Plan: Avoid frequent bending and stooping  No lifting greater than 10 lbs. May use ice or moist heat for pain. Weight loss is of benefit. Best medication for lumbar disc disease is arthritis medications like motrin, celebrex and naprosyn. Exercise is important to improve your indurance and does allow people to function better inspite of back pain.  Avoid overhead lifting and overhead use of the arms. Do not lift greater than 5 lbs. Adjust head rest in vehicle to prevent hyperextension if rear ended. Take extra precautions to avoid falling. MRI of the cervical spine due to upper extremity weakness C6 right and left C7

## 2022-07-23 NOTE — Progress Notes (Signed)
Office Visit Note   Patient: Michael [name]           Date of Birth: [birth year]           MRN: [id number] Visit Date: 07/23/2022              Requested by: [name], NP [place],  [place] [address] PCP: [name], NP   Assessment & Plan: Visit Diagnoses:  1. Pain in left hand   2. Numbness and tingling of both upper extremities   3. Crush injury arm, right, sequela   4. Flexion contracture of joint of right hand   5. Spinal stenosis of cervical region     Plan: Avoid frequent bending and stooping  No lifting greater than 10 lbs. May use ice or moist heat for pain. Weight loss is of benefit. Best medication for lumbar disc disease is arthritis medications like motrin, celebrex and naprosyn. Exercise is important to improve your indurance and does allow people to function better inspite of back pain.  Avoid overhead lifting and overhead use of the arms. Do not lift greater than 5 lbs. Adjust head rest in vehicle to prevent hyperextension if rear ended. Take extra precautions to avoid falling. MRI of the cervical spine due to upper extremity weakness C6 right and left C7  Follow-Up Instructions: Return in about 1 day (around 07/24/2022).   Orders:  Orders Placed This Encounter  Procedures   XR Hand Complete Right   No orders of the defined types were placed in this encounter.     Procedures: No procedures performed   Clinical Data: No additional findings.   Subjective: Chief Complaint  Patient presents with   Lower Back - Pain    51 year old right handed male with bilateral arm numbness and weakness. No bowel or bladder dysfunction. 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 He is having dfficulty With use of hands.    Review of Systems   Objective: Vital Signs: BP (!) 160/110 (BP Location: Left Arm, Patient Position: Sitting)   Pulse 69   Ht 5'  10" (1.778 m)   Wt 269 lb (122 kg)   BMI 38.60 kg/m   Physical Exam  Ortho Exam  Specialty Comments:  No specialty comments available.  Imaging: No results found.   PMFS History: Patient Active Problem List   Diagnosis Date Noted   Acute non-recurrent maxillary sinusitis 12/05/2021   CHF (congestive heart failure), NYHA class I, acute, diastolic ([place]) 24/26/8341   Central sleep apnea comorbid with prescribed opioid use 09/21/2019   History of uncontrolled hypertension 09/21/2019   Complex sleep apnea syndrome 08/18/2019   Claustrophobia 08/18/2019   Anxiety associated with depression 08/18/2019   Sleep disturbance 10/01/2018   Low HDL (under 40) 05/11/2017   Moderate major depression ([place]) 05/11/2017   Hypokalemia 04/04/2017   Morbid obesity ([place]) 04/04/2017   Chest wall pain 04/04/2017   Asthma 02/13/2016   Non compliance with medical treatment 06/13/2015   OSA (obstructive sleep apnea) 04/06/2013   Abnormal EKG 11/08/2012   Essential hypertension    HTN (hypertension), malignant 04/29/2012   Migraine headache with aura 04/29/2012   Past Medical History:  Diagnosis Date   Anxiety    Depression    Questionable per records. Not on any medication.    History of kidney stones    Hx of echocardiogram  a. Echo 12/13/12: Mild LVH, EF 78-29%, grade 1 diastolic dysfunction, mild LAE, mild RVE, mild RAE   Hypertension    Insomnia    Left knee pain    Low back pain    Migraine    Noncompliance    OSA (obstructive sleep apnea)    a. Sleep Study 02/2013:  mod OSA, AHI 33 per hour, O2 sat nadir 85%    Family History  Problem Relation Age of Onset   Cancer Father        prostate, colon- age was in his 53's   Heart disease Father 64       MI    Hypertension Father    Colon cancer Father    Diabetes Mother    Hypertension Mother    Hypertension Brother    Cancer Maternal Grandfather    Cancer Paternal Grandfather    Esophageal cancer Neg Hx    Stomach cancer  Neg Hx    Rectal cancer Neg Hx     Past Surgical History:  Procedure Laterality Date   Admission  04/2012   Malignant HTN, HA.  CT head negative, cardiology consult.     CYSTOSCOPY WITH RETROGRADE PYELOGRAM, URETEROSCOPY AND STENT PLACEMENT Left 01/24/2019   Procedure: CYSTOSCOPY WITH RETROGRADE PYELOGRAM, URETEROSCOPY AND STENT PLACEMENT;  Surgeon: Cleon Gustin, MD;  Location: [facility];  Service: Urology;  Laterality: Left;   HOLMIUM LASER APPLICATION Left 5/62/1308   Procedure: HOLMIUM LASER APPLICATION;  Surgeon: Cleon Gustin, MD;  Location: [facility];  Service: Urology;  Laterality: Left;   MANDIBLE SURGERY  1998   metal plate   Social History   Occupational History   Occupation: Unemployed//disabled  Tobacco Use   Smoking status: Never   Smokeless tobacco: Never  Vaping Use   Vaping Use: Never used  Substance and Sexual Activity   Alcohol use: No    Alcohol/week: 0.0 standard drinks of alcohol   Drug use: Yes    Types: Marijuana   Sexual activity: Yes    Comment: per pt's blue health form - 1 sex partner in last 12 months

## 2022-07-25 ENCOUNTER — Other Ambulatory Visit: Payer: Self-pay | Admitting: Family Medicine

## 2022-07-25 NOTE — Telephone Encounter (Signed)
Rx 09/20/21 #30 11 Rf- too soon Requested Prescriptions  Pending Prescriptions Disp Refills  . potassium chloride (KLOR-CON) 10 MEQ tablet [Pharmacy Med Name: Potassium Chloride 31mq Extended-Release Tablet] 30 tablet 11    Sig: Take 1 tablet by mouth every day     Endocrinology:  Minerals - Potassium Supplementation Failed - 07/25/2022  2:05 AM      Failed - K in normal range and within 360 days    Potassium  Date Value Ref Range Status  06/29/2021 3.0 (L) 3.5 - 5.1 mmol/L Final         Failed - Cr in normal range and within 360 days    Creat  Date Value Ref Range Status  06/03/2016 1.07 0.60 - 1.35 mg/dL Final   Creatinine, Ser  Date Value Ref Range Status  06/29/2021 1.09 0.61 - 1.24 mg/dL Final         Passed - Valid encounter within last 12 months    Recent Outpatient Visits          7 months ago Acute non-recurrent maxillary sinusitis   [facility]   8 months ago [facility] cough   [facility]   11 months ago [facility]counter for weight management   [facility] [place] [facility]   1 year ago [facility] to establish care   [facility] [place] [facility]   1 year ago Dysuria   [facility] at [facility] [facility]

## 2022-08-05 ENCOUNTER — Encounter: Payer: Self-pay | Admitting: *Deleted

## 2022-08-05 ENCOUNTER — Ambulatory Visit: Payer: Medicaid Other | Admitting: Occupational Therapy

## 2022-08-11 ENCOUNTER — Ambulatory Visit: Payer: Medicare Other | Attending: Specialist | Admitting: Occupational Therapy

## 2022-08-11 ENCOUNTER — Encounter: Payer: Self-pay | Admitting: Occupational Therapy

## 2022-08-11 DIAGNOSIS — M25641 Stiffness of right hand, not elsewhere classified: Secondary | ICD-10-CM | POA: Diagnosis present

## 2022-08-11 DIAGNOSIS — M25642 Stiffness of left hand, not elsewhere classified: Secondary | ICD-10-CM | POA: Diagnosis present

## 2022-08-11 DIAGNOSIS — M79641 Pain in right hand: Secondary | ICD-10-CM | POA: Diagnosis present

## 2022-08-11 DIAGNOSIS — M79642 Pain in left hand: Secondary | ICD-10-CM | POA: Diagnosis present

## 2022-08-11 NOTE — Therapy (Signed)
OUTPATIENT OCCUPATIONAL THERAPY ORTHO EVALUATION  Patient Name: Michael [name] MRN: [id number] DOB:Nov 05, 1971, 51 y.o., male Today's Date: 08/11/2022  PCP: [name], NP REFERRING PROVIDER: Jessy Oto, MD    OT End of Session - 08/11/22 0934     Visit Number 1    Authorization Type Medicaid    OT Start Time 0932    OT Stop Time 1012    OT Time Calculation (min) 40 min    Behavior During Therapy [facility] for tasks assessed/performed            Past Medical History:  Diagnosis Date   Anxiety    Depression    Questionable per records. Not on any medication.    History of kidney stones    Hx of echocardiogram    a. Echo 12/13/12: Mild LVH, EF 33-29%, grade 1 diastolic dysfunction, mild LAE, mild RVE, mild RAE   Hypertension    Insomnia    Left knee pain    Low back pain    Migraine    Noncompliance    OSA (obstructive sleep apnea)    a. Sleep Study 02/2013:  mod OSA, AHI 33 per hour, O2 sat nadir 85%   Past Surgical History:  Procedure Laterality Date   Admission  04/2012   Malignant HTN, HA.  CT head negative, cardiology consult.     CYSTOSCOPY WITH RETROGRADE PYELOGRAM, URETEROSCOPY AND STENT PLACEMENT Left 01/24/2019   Procedure: CYSTOSCOPY WITH RETROGRADE PYELOGRAM, URETEROSCOPY AND STENT PLACEMENT;  Surgeon: Cleon Gustin, MD;  Location: [facility];  Service: Urology;  Laterality: Left;   HOLMIUM LASER APPLICATION Left 05/16/8415   Procedure: HOLMIUM LASER APPLICATION;  Surgeon: Cleon Gustin, MD;  Location: [facility];  Service: Urology;  Laterality: Left;   [place]   metal plate   Patient Active Problem List   Diagnosis Date Noted   Acute non-recurrent maxillary sinusitis 12/05/2021   CHF (congestive heart failure), NYHA class I, acute, diastolic ([place]) 60/63/0160   Central sleep apnea comorbid with prescribed opioid use 09/21/2019   History of uncontrolled hypertension 09/21/2019   Complex sleep  apnea syndrome 08/18/2019   Claustrophobia 08/18/2019   Anxiety associated with depression 08/18/2019   Sleep disturbance 10/01/2018   Low HDL (under 40) 05/11/2017   Moderate major depression ([place]) 05/11/2017   Hypokalemia 04/04/2017   Morbid obesity ([place]) 04/04/2017   Chest wall pain 04/04/2017   Asthma 02/13/2016   Non compliance with medical treatment 06/13/2015   OSA (obstructive sleep apnea) 04/06/2013   Abnormal EKG 11/08/2012   Essential hypertension    HTN (hypertension), malignant 04/29/2012   Migraine headache with aura 04/29/2012    ONSET DATE: 07/23/2022   REFERRING DIAG: F09.323 (ICD-10-CM) - Pain in left hand M24.541 (ICD-10-CM) - Flexion contracture of joint of right hand   THERAPY DIAG:  Pain in left hand  Stiffness of left hand, not elsewhere classified  Rationale for Evaluation and Treatment Rehabilitation  SUBJECTIVE:   SUBJECTIVE STATEMENT: Pt arrives to OP OT evaluation w/ primary concern of chronic back pain as well as R hand pain. Per pt report, he was getting PT for his back a while ago but believes he should have had surgery and is waiting for further imaging at this point. Related to his hand, states he was trying to move a cat house when it got turned the wrong way and twisted his fingers; reports this happened about a month ago. Pt accompanied by: self  PERTINENT HISTORY: R hand ring and little finger middle phalanx nondisplaced fractures end of June 2023; UE weakness w/ suspected spinal stenosis of cervical region; lumbar disc herniation and radiculopathy w/ recommended lumbar fusion L5-S1; PMH includes CHF, OSA w/ CPAP, uncontrolled HTN, anxiety, depression, obesity, asthma, and h/o migraine headache  PRECAUTIONS: Lumbar precautions: Avoid frequent bending and stooping No lifting greater than 10 lbs May use ice or moist heat for pain Weight loss is of benefit Best medication for lumbar disc disease is arthritis medications like motrin,  celebrex and naprosyn. Exercise is important to improve your indurance and does allow people to function better inspite of back pain. Cervical precautions: Avoid overhead lifting and overhead use of the arms Do not lift greater than 5 lbs Adjust head rest in vehicle to prevent hyperextension if rear ended Take extra precautions to avoid falling MRI of the cervical spine due to upper extremity weakness C6 right and left C7  WEIGHT BEARING RESTRICTIONS No  PAIN: Are you having pain? Yes: NPRS scale: 7/10 Pain location: lower back; L side Pain description: "painful" Aggravating factors: weather, walking Relieving factors: medication  FALLS: Has patient fallen in last 6 months? No  LIVING ENVIRONMENT: Lives with: lives alone Lives in: House/apartment Stairs:  Lives on a 3rd floor apartment Has following equipment at home: None  PLOF: Independent and Vocation/Vocational requirements: currently on disability; was working as a Research scientist (medical)  PATIENT GOALS: Does "a lot of stuff with his hands;" wants to return to PLOF  OBJECTIVE:   HAND DOMINANCE: Right  ADLs: Overall ADLs: Pt reports he is able to complete BADLs w/out difficulty Overall IADLs: Pt reports severe difficulty opening tight/new jars, carrying medium-heavy weighted objects, cutting tough food, and work-related lifting/packaging due to hand pain  FUNCTIONAL OUTCOME MEASURES: Quick Dash: 61.4/100  UPPER EXTREMITY ROM     Active ROM Right Eval - 8/14  Wrist flexion (0-80) 40  Wrist extension (0-70) 56  Wrist ulnar deviation (0-30) 28  Wrist radial deviation (0-20) 15  Wrist pronation WFL  Wrist supination   (Blank rows = not tested)  Active ROM Right Eval - 8/14  Thumb MCP (0-60) 67  Thumb IP (0-80) 55  Thumb Radial abd/add (0-55) 55  Thumb Palmar abd/add (0-45) 27  Thumb Opposition to Small Finger WNL  Index MCP (0-90) 77  Index PIP (0-100) 84  Index DIP (0-70) 37  Long MCP (0-90) 88  Long PIP  (0-100) 78  Long DIP (0-70) 54  Ring MCP (0-90) 90  Ring PIP (0-100) 80  Ring DIP (0-70) 37  Little MCP (0-90) 93  Little PIP (0-100) 73  Little DIP (0-70) 35  (Blank rows = not tested)  HAND FUNCTION: Grip strength: Right: 24 lbs; Left: 50 lbs Lateral pinch: Right: 12 lbs; Left: 21 lbs 3-Point pinch: Right: 6 lbs; Left: 9 lbs  COORDINATION: WFL  SENSATION: WFL, per pt report  EDEMA: no observable edema  COGNITION: Overall cognitive status: No family/caregiver present to determine baseline cognitive functioning Areas of impairment:  pt did demonstrate some difficulty w/ verbal instruction/questions, often requiring repetition for understanding   TODAY'S TREATMENT:  None   PATIENT EDUCATION: Educated on role and purpose of OT as well as potential interventions and goals for therapy based on initial evaluation findings. Person educated: Patient Education method: Explanation Education comprehension: verbalized understanding   HOME EXERCISE PROGRAM: To be administered  GOALS: Goals reviewed with patient? No  SHORT TERM GOALS: Target date:  09/12/22  Status:  1 Pt will demonstrate independence w/ HEP designed for hand ROM, stretching, and grip strengthening  Baseline: to be administered Initial  2 Pt will increase range of motion in 4th and 5th finger DIPJ flexion by at least 8 degrees to improve grasp and overall grip strength Baseline: Ring: 37 deg, Little: 35 deg Initial  3 Pt will improve R hand lateral pinch strength by at least 5 lbs for increased functional use during meal prep tasks Baseline: Right: 12 lbs; Left: 21 lbs Initial    LONG TERM GOALS: Target date:  10/20/22       Status:  1 Pt will increase functional use of R hand during meal prep and work-related activities as evidenced by improving QuickDASH score to 41% or better indicating moderate impairment Baseline: 61.4% (severe functional impairment) Initial  2 Pt will improve grip strength in  R hand to at least 35 lbs without pain for increased functional use during IADL and work-related tasks Baseline: Right: 24 lbs; Left: 50 lbs Initial  3 Pt will demonstrate use of RUE as gross assist/stabilization for bilateral tasks, successfully opening at least 3 various new/tight jars or w/ no more than mild difficulty by d/c Baseline: severe difficulty; Right grip: 24 lbs; Left: 50 lbs Initial     ASSESSMENT:  CLINICAL IMPRESSION: Pt is a 51 y/o who presents to OP OT about 7 weeks s/p R hand injury when moving furniture. Pt was referred by Dr. Louanne Skye to address R hand ROM, stretching, and grip strengthening. Pt is being treated conservatively and does not have any type of brace for his R hand. Pt is restricted due to pain w/ evaluation indicating decreased AROM, particularly of wrist flexion and DIPJ flexion, as well as decreased grip and pinch strength. Pt will benefit from skilled occupational therapy services to address these deficits, as well as pain management, introduction of compensatory strategies/AE prn, and implementation of an HEP to improve participation and safety during IADLs and ensure maximal functional use of RUE.  PERFORMANCE DEFICITS in functional skills including ROM, strength, pain, body mechanics, and UE functional use.  IMPAIRMENTS are limiting patient from IADLs, work, and leisure.   COMORBIDITIES has co-morbidities such as chronic back pain 2/2 lumbar disc herniation and radiculopathy  that affects occupational performance. Patient will benefit from skilled OT to address above impairments and improve overall function.  MODIFICATION OR ASSISTANCE TO COMPLETE EVALUATION: No modification of tasks or assist necessary to complete an evaluation.  OT OCCUPATIONAL PROFILE AND HISTORY: Problem focused assessment: Including review of records relating to presenting problem.  CLINICAL DECISION MAKING: Moderate - several treatment options, min-mod task modification  necessary  REHAB POTENTIAL: Good  EVALUATION COMPLEXITY: Moderate   PLAN: OT FREQUENCY: 1x/week  OT DURATION: 8 weeks  PLANNED INTERVENTIONS: self care/ADL training, therapeutic exercise, therapeutic activity, manual therapy, passive range of motion, splinting, electrical stimulation, ultrasound, iontophoresis, paraffin, fluidotherapy, compression bandaging, moist heat, cryotherapy, patient/family education, and DME and/or AE instructions  RECOMMENDED OTHER SERVICES: None  CONSULTED AND AGREED WITH PLAN OF CARE: Patient  PLAN FOR NEXT SESSION: reviewed EDC glides and gentle AROM; joint protection strategies for hands   Kathrine Cords, OTR/L, MSOT 08/11/2022, 10:23 AM

## 2022-08-18 ENCOUNTER — Ambulatory Visit: Payer: Medicare Other | Admitting: Occupational Therapy

## 2022-08-24 ENCOUNTER — Other Ambulatory Visit: Payer: Self-pay | Admitting: Family Medicine

## 2022-08-25 ENCOUNTER — Ambulatory Visit: Payer: Medicare Other | Admitting: Occupational Therapy

## 2022-08-25 ENCOUNTER — Encounter: Payer: Self-pay | Admitting: Occupational Therapy

## 2022-08-25 DIAGNOSIS — M25641 Stiffness of right hand, not elsewhere classified: Secondary | ICD-10-CM

## 2022-08-25 DIAGNOSIS — M79642 Pain in left hand: Secondary | ICD-10-CM | POA: Diagnosis not present

## 2022-08-25 DIAGNOSIS — M79641 Pain in right hand: Secondary | ICD-10-CM

## 2022-08-25 NOTE — Therapy (Addendum)
OUTPATIENT OCCUPATIONAL THERAPY TREATMENT NOTE  Patient Name: Michael [name] MRN: [id number] DOB:13-Dec-1971, 51 y.o., male Today's Date: 08/25/2022  PCP: [name], NP REFERRING PROVIDER: Jessy Oto, MD   Patient did not return to therapy, see below for last known patient status   OT End of Session - 08/25/22 1106     Visit Number 2    Number of Visits 9    Date for OT Re-Evaluation 10/20/22    Authorization Type [employer] MC/MCD    Authorization Time Period VL: [place]    OT Start Time 1102    OT Stop Time 1143    OT Time Calculation (min) 41 min    Behavior During Therapy WFL for tasks assessed/performed            Past Medical History:  Diagnosis Date   Anxiety    Depression    Questionable per records. Not on any medication.    History of kidney stones    Hx of echocardiogram    a. Echo 12/13/12: Mild LVH, EF 19-14%, grade 1 diastolic dysfunction, mild LAE, mild RVE, mild RAE   Hypertension    Insomnia    Left knee pain    Low back pain    Migraine    Noncompliance    OSA (obstructive sleep apnea)    a. Sleep Study 02/2013:  mod OSA, AHI 33 per hour, O2 sat nadir 85%   Past Surgical History:  Procedure Laterality Date   Admission  04/2012   Malignant HTN, HA.  CT head negative, cardiology consult.     CYSTOSCOPY WITH RETROGRADE PYELOGRAM, URETEROSCOPY AND STENT PLACEMENT Left 01/24/2019   Procedure: CYSTOSCOPY WITH RETROGRADE PYELOGRAM, URETEROSCOPY AND STENT PLACEMENT;  Surgeon: Cleon Gustin, MD;  Location: [facility];  Service: Urology;  Laterality: Left;   HOLMIUM LASER APPLICATION Left 7/82/9562   Procedure: HOLMIUM LASER APPLICATION;  Surgeon: Cleon Gustin, MD;  Location: [facility];  Service: Urology;  Laterality: Left;   [place]   metal plate   Patient Active Problem List   Diagnosis Date Noted   Acute non-recurrent maxillary sinusitis 12/05/2021   CHF (congestive heart failure), NYHA class I,  acute, diastolic ([place]) 13/07/6577   Central sleep apnea comorbid with prescribed opioid use 09/21/2019   History of uncontrolled hypertension 09/21/2019   Complex sleep apnea syndrome 08/18/2019   Claustrophobia 08/18/2019   Anxiety associated with depression 08/18/2019   Sleep disturbance 10/01/2018   Low HDL (under 40) 05/11/2017   Moderate major depression ([place]) 05/11/2017   Hypokalemia 04/04/2017   Morbid obesity ([place]) 04/04/2017   Chest wall pain 04/04/2017   Asthma 02/13/2016   Non compliance with medical treatment 06/13/2015   OSA (obstructive sleep apnea) 04/06/2013   Abnormal EKG 11/08/2012   Essential hypertension    HTN (hypertension), malignant 04/29/2012   Migraine headache with aura 04/29/2012    ONSET DATE: 07/23/2022   REFERRING DIAG: I69.629 (ICD-10-CM) - Pain in left hand M24.541 (ICD-10-CM) - Flexion contracture of joint of right hand   THERAPY DIAG:  Pain in right hand  Stiffness of right hand, not elsewhere classified  Rationale for Evaluation and Treatment Rehabilitation  SUBJECTIVE:   SUBJECTIVE STATEMENT: Pt reports he is having a lot of back pain and is going to call his doctor to see if he can be seen sooner than 9/25 Pt accompanied by: self  PAIN: Are you having pain? No  PERTINENT HISTORY: R hand ring and  little finger middle phalanx nondisplaced fractures end of June 2023; UE weakness w/ suspected spinal stenosis of cervical region; lumbar disc herniation and radiculopathy w/ recommended lumbar fusion L5-S1; PMH includes CHF, OSA w/ CPAP, uncontrolled HTN, anxiety, depression, obesity, asthma, and h/o migraine headache  PRECAUTIONS: Lumbar precautions: Avoid frequent bending and stooping No lifting greater than 10 lbs May use ice or moist heat for pain Weight loss is of benefit Best medication for lumbar disc disease is arthritis medications like motrin, celebrex and naprosyn. Exercise is important to improve endurance and does help  improve function despite back pain Cervical precautions: Avoid overhead lifting and overhead use of the arms Do not lift greater than 5 lbs Adjust head rest in vehicle to prevent hyperextension if rear ended Take extra precautions to avoid falling MRI of the cervical spine due to upper extremity weakness C6 right and left C7  PLOF: Independent and Vocation/Vocational requirements: currently on disability; was working as a Research scientist (medical)  PATIENT GOALS: Does "a lot of stuff with his hands;" wants to return to PLOF   OBJECTIVE:   HAND DOMINANCE: Right  UPPER EXTREMITY ROM     Active ROM Right Eval - 8/14  Wrist flexion (0-80) 40  Wrist extension (0-70) 56  Wrist ulnar deviation (0-30) 28  Wrist radial deviation (0-20) 15  Wrist pronation WFL  Wrist supination   (Blank rows = not tested)  Active ROM Right Eval - 8/14  Thumb MCP (0-60) 67  Thumb IP (0-80) 55  Thumb Radial abd/add (0-55) 55  Thumb Palmar abd/add (0-45) 27  Thumb Opposition to Small Finger WNL  Index MCP (0-90) 77  Index PIP (0-100) 84  Index DIP (0-70) 37  Long MCP (0-90) 88  Long PIP (0-100) 78  Long DIP (0-70) 54  Ring MCP (0-90) 90  Ring PIP (0-100) 80  Ring DIP (0-70) 37  Little MCP (0-90) 93  Little PIP (0-100) 73  Little DIP (0-70) 35  (Blank rows = not tested)  EDEMA: Left: 6.3 cm, Right: 7.0 cm  COGNITION: Pt demonstrates some difficulty w/ verbal instruction/questions, often requiring repetition for understanding  ------------------------------------------------------------------------------------------------------------------------------------------------------  TODAY'S TREATMENT:   PROM OT performed gentle PROM of R ring and little finger PIPJ flexion/extension incorporating passive stretch held at end range of ext to increase ROM and decrease joint stiffness. Able to achieve approx 90% of ext and good flex; completed within tolerable level of discomfort. OT then facilitated  self-PROM w/ pt able to return demonstration w/out difficulty. Completed x10 each.  Edema Management Education provided on pain and edema management strategies (ice vs heat packs, massage, AROM) w/ corresponding handout administered to pt; pt returned demonstration and verbalized understanding as appropriate  AROM Isolated PIPJ flex/ext and DIPJ flex/ext to ring and little finger w/ blocking of other joints; completed x10 each w/ OT providing initial cues/demo for positioning.   Soft Tissue Massage Gentle soft tissue massage w/ ice probe and gel to palmar aspect of R ring and little fingers for 6 min to facilitate mobility within tissue structures and decrease tension, edema, and pain. Pt tolerated w/out discomfort; skin intact    PATIENT EDUCATION: Ongoing condition-specific education related to therapeutic interventions completed this session Person educated: Patient Education method: Explanation Education comprehension: verbalized understanding  ------------------------------------------------------------------------------------------------------------------------------------------------------  HOME EXERCISE PROGRAM: Access Code: [email or web address] - Hand PROM PIP Extension  - 3 x daily - 25 reps - Seated Finger PIP Flexion AROM  - 2 x daily - 1 sets - 25 reps -  Seated Finger DIP Flexion AROM with Blocking  - 2 x daily - 1 sets - 25 reps  GOALS: Goals reviewed with patient? No  SHORT TERM GOALS: Target date:  09/12/22       Status:  1 Pt will demonstrate independence w/ HEP designed for hand ROM, stretching, and grip strengthening  Baseline: to be administered Progressing  2 Pt will increase range of motion in 4th and 5th finger DIPJ flexion by at least 8 degrees to improve grasp and overall grip strength Baseline: Ring: 37 deg, Little: 35 deg Progressing  3 Pt will improve R hand lateral pinch strength by at least 5 lbs for increased functional use during meal prep tasks Baseline: Right: 12  lbs; Left: 21 lbs Progressing    LONG TERM GOALS: Target date:  10/20/22       Status:  1 Pt will increase functional use of R hand during meal prep and work-related activities as evidenced by improving QuickDASH score to 41% or better indicating moderate impairment Baseline: 61.4% (severe functional impairment) Progressing  2 Pt will improve grip strength in R hand to at least 35 lbs without pain for increased functional use during IADL and work-related tasks Baseline: Right: 24 lbs; Left: 50 lbs Progressing  3 Pt will demonstrate use of RUE as gross assist/stabilization for bilateral tasks, successfully opening at least 3 various new/tight jars or w/ no more than mild difficulty by d/c Baseline: severe difficulty; Right grip: 24 lbs; Left: 50 lbs Progressing     ASSESSMENT:  CLINICAL IMPRESSION: Pt arrives for first treatment session following initial evaluation 2 weeks ago on 08/11/22. OT reviewed goals w/ pt who is agreeable to plan of care at this time. AROM and PROM then initiated to decrease stiffness and encourage joint mobility within pain-free range, focusing on index and little finger PIP and DIPJs. Pt able to return demonstration of exercises w/ good technique and OT provided corresponding handouts. Pt initially reported increased stiffness w/ ROM exercises, but stiffness decreased w/ increased repetition. Stiffness appears to be primarily around index finger PIPJ, which is limiting to functional grasp.  PERFORMANCE DEFICITS in functional skills including ROM, strength, pain, body mechanics, and UE functional use.  IMPAIRMENTS are limiting patient from IADLs, work, and leisure.   COMORBIDITIES has co-morbidities such as chronic back pain 2/2 lumbar disc herniation and radiculopathy  that affects occupational performance. Patient will benefit from skilled OT to address above impairments and improve overall function.   PLAN: OT FREQUENCY: 1x/week  OT DURATION: 8 weeks  PLANNED  INTERVENTIONS: self care/ADL training, therapeutic exercise, therapeutic activity, manual therapy, passive range of motion, splinting, electrical stimulation, ultrasound, iontophoresis, paraffin, fluidotherapy, compression bandaging, moist heat, cryotherapy, patient/family education, and DME and/or AE instructions  RECOMMENDED OTHER SERVICES: None  CONSULTED AND AGREED WITH PLAN OF CARE: Patient  PLAN FOR NEXT SESSION: Option for PIPJ ring finger extension orthosis? Continue w/ AROM/AAROM as able   Kathrine Cords, OTR/L, MSOT 08/25/2022, 1:56 PM

## 2022-09-02 ENCOUNTER — Ambulatory Visit: Payer: Medicare Other | Admitting: Occupational Therapy

## 2022-09-08 ENCOUNTER — Ambulatory Visit: Payer: Medicare Other | Admitting: Occupational Therapy

## 2022-09-11 ENCOUNTER — Ambulatory Visit (INDEPENDENT_AMBULATORY_CARE_PROVIDER_SITE_OTHER): Payer: Medicare Other | Admitting: Specialist

## 2022-09-11 ENCOUNTER — Encounter: Payer: Self-pay | Admitting: Specialist

## 2022-09-11 ENCOUNTER — Telehealth: Payer: Self-pay | Admitting: Specialist

## 2022-09-11 VITALS — BP 160/130 | HR 71 | Ht 70.0 in | Wt 269.0 lb

## 2022-09-11 DIAGNOSIS — M5126 Other intervertebral disc displacement, lumbar region: Secondary | ICD-10-CM | POA: Diagnosis not present

## 2022-09-11 DIAGNOSIS — M20021 Boutonniere deformity of right finger(s): Secondary | ICD-10-CM

## 2022-09-11 DIAGNOSIS — S471XXS Crushing injury of right shoulder and upper arm, sequela: Secondary | ICD-10-CM | POA: Diagnosis not present

## 2022-09-11 DIAGNOSIS — M4802 Spinal stenosis, cervical region: Secondary | ICD-10-CM

## 2022-09-11 DIAGNOSIS — M5416 Radiculopathy, lumbar region: Secondary | ICD-10-CM | POA: Diagnosis not present

## 2022-09-11 DIAGNOSIS — M5136 Other intervertebral disc degeneration, lumbar region: Secondary | ICD-10-CM

## 2022-09-11 MED ORDER — OXYCODONE HCL 5 MG PO TABS: 5.0000 mg | ORAL_TABLET | ORAL | 0 refills | Status: DC | PRN

## 2022-09-11 MED ORDER — OXYCODONE HCL 5 MG PO CAPS: 5.0000 mg | ORAL_CAPSULE | ORAL | 0 refills | Status: DC | PRN

## 2022-09-11 NOTE — Progress Notes (Signed)
Office Visit Note   Patient: Michael [name]           Date of Birth: December 14, 1971           MRN: [id number] Visit Date: 09/11/2022              Requested by: [name], NP [place],  [place] [address] PCP: [name], NP   Assessment & Plan: Visit Diagnoses:  1. Crush injury arm, right, sequela   2. Central slip extensor tendon injury (boutonniere), right   3. Lumbar disc herniation   4. Lumbar radiculopathy   5. Spinal stenosis of cervical region   6. Lumbar degenerative disc disease     Plan: Avoid bending, stooping and avoid lifting weights greater than 10 lbs. Avoid prolong standing and walking. Order for a new walker with wheels. Surgery scheduling secretary Kandice Hams, will call you in the next week to schedule for surgery.  Surgery recommended is a one level lumbar fusion L5-S1 this would be done with rods, screws and cages with local bone graft and allograft (donor bone graft). Take hydrocodone for for pain. Risk of surgery includes risk of infection 1 in 200 patients, bleeding 1/2% chance you would need a transfusion.   Risk to the nerves is one in 10,000. You will need to use a brace for 3 months and wean from the brace on the 4th month. Expect improved walking and standing tolerance. Expect relief of leg pain but numbness may persist depending on the length and degree of pressure that has been present.  Follow-Up Instructions: No follow-ups on file.   Orders:  No orders of the defined types were placed in this encounter.  Meds ordered this encounter  Medications   oxycodone (OXY-IR) 5 MG capsule    Sig: Take 1 capsule (5 mg total) by mouth every 4 (four) hours as needed.    Dispense:  30 capsule    Refill:  0      Procedures: No procedures performed   Clinical Data: No additional findings.   Subjective: Chief Complaint  Patient presents with   Neck - Follow-up   Right Arm - Follow-up    51 year old male with  history of LBP with radiation into right more than left buttock. Pain is present at night and it keeps him from being able to sleep. No bowel or bladder difficulty. Unable to stand and walk any distance. He has had a recent injury to the right hand and is seening OT at [facility]. He is receiving Medicare and uses [employer] for his insurance.  Seen last 05/2022 at which time surgery was recommended and right hand OT. The right hand is some better with the exercises. RIght now his back is limiting his abililty to get to OT. He reportedly did not go to his cardiology appt before his scheduling for surgery so surgery was not scheduled.     Review of Systems  Constitutional: Negative.   HENT: Negative.    Eyes: Negative.   Respiratory: Negative.    Cardiovascular: Negative.   Gastrointestinal: Negative.   Endocrine: Negative.   Genitourinary: Negative.   Musculoskeletal: Negative.   Skin: Negative.   Allergic/Immunologic: Negative.   Neurological: Negative.   Hematological: Negative.   Psychiatric/Behavioral: Negative.       Objective: Vital Signs: BP (!) 160/130 (BP Location: Left Arm, Patient Position: Sitting)   Pulse 71   Ht '5\' 10"'$  (1.778 m)  Wt 269 lb (122 kg)   BMI 38.60 kg/m   Physical Exam Constitutional:      Appearance: He is well-developed.  HENT:     Head: Normocephalic and atraumatic.  Eyes:     Pupils: Pupils are equal, round, and reactive to light.  Pulmonary:     Effort: Pulmonary effort is normal.     Breath sounds: Normal breath sounds.  Abdominal:     General: Bowel sounds are normal.     Palpations: Abdomen is soft.  Musculoskeletal:     Cervical back: Normal range of motion and neck supple.  Skin:    General: Skin is warm and dry.  Neurological:     Mental Status: He is alert and oriented to person, place, and time.  Psychiatric:        Behavior: Behavior normal.        Thought Content: Thought content normal.        Judgment: Judgment  normal.    Back Exam   Tenderness  The patient is experiencing tenderness in the lumbar.  Range of Motion  Extension:  abnormal  Flexion:  abnormal  Lateral bend right:  abnormal  Lateral bend left:  abnormal  Rotation right:  abnormal  Rotation left:  abnormal   Muscle Strength  Right Quadriceps:  5/5  Left Quadriceps:  5/5  Right Hamstrings:  5/5  Left Hamstrings:  5/5   Reflexes  Patellar:  0/4 Achilles:  0/4  Comments:  No focal motor deficits. SLR is negative.  Pain is right buttock.       Specialty Comments:  No specialty comments available.  Imaging: No results found.   PMFS History: Patient Active Problem List   Diagnosis Date Noted   Acute non-recurrent maxillary sinusitis 12/05/2021   CHF (congestive heart failure), NYHA class I, acute, diastolic ([place]) 14/43/1540   Central sleep apnea comorbid with prescribed opioid use 09/21/2019   History of uncontrolled hypertension 09/21/2019   Complex sleep apnea syndrome 08/18/2019   Claustrophobia 08/18/2019   Anxiety associated with depression 08/18/2019   Sleep disturbance 10/01/2018   Low HDL (under 40) 05/11/2017   Moderate major depression ([place]) 05/11/2017   Hypokalemia 04/04/2017   Morbid obesity ([place]) 04/04/2017   Chest wall pain 04/04/2017   Asthma 02/13/2016   Non compliance with medical treatment 06/13/2015   OSA (obstructive sleep apnea) 04/06/2013   Abnormal EKG 11/08/2012   Essential hypertension    HTN (hypertension), malignant 04/29/2012   Migraine headache with aura 04/29/2012   Past Medical History:  Diagnosis Date   Anxiety    Depression    Questionable per records. Not on any medication.    History of kidney stones    Hx of echocardiogram    a. Echo 12/13/12: Mild LVH, EF 08-67%, grade 1 diastolic dysfunction, mild LAE, mild RVE, mild RAE   Hypertension    Insomnia    Left knee pain    Low back pain    Migraine    Noncompliance    OSA (obstructive sleep apnea)    a.  Sleep Study 02/2013:  mod OSA, AHI 33 per hour, O2 sat nadir 85%    Family History  Problem Relation Age of Onset   Cancer Father        prostate, colon- age was in his 72's   Heart disease Father 88       MI    Hypertension Father    Colon cancer Father    Diabetes Mother  Hypertension Mother    Hypertension Brother    Cancer Maternal Grandfather    Cancer Paternal Grandfather    Esophageal cancer Neg Hx    Stomach cancer Neg Hx    Rectal cancer Neg Hx     Past Surgical History:  Procedure Laterality Date   Admission  04/2012   Malignant HTN, HA.  CT head negative, cardiology consult.     CYSTOSCOPY WITH RETROGRADE PYELOGRAM, URETEROSCOPY AND STENT PLACEMENT Left 01/24/2019   Procedure: CYSTOSCOPY WITH RETROGRADE PYELOGRAM, URETEROSCOPY AND STENT PLACEMENT;  Surgeon: Cleon Gustin, MD;  Location: [facility];  Service: Urology;  Laterality: Left;   HOLMIUM LASER APPLICATION Left 8/91/6945   Procedure: HOLMIUM LASER APPLICATION;  Surgeon: Cleon Gustin, MD;  Location[facility];  Service: Urology;  Laterality: Left;   MANDIBLE SURGERY  1998   metal plate   Social History   Occupational History   Occupation: Unemployed//disabled  Tobacco Use   Smoking status: Never   Smokeless tobacco: Never  Vaping Use   Vaping Use: Never used  Substance and Sexual Activity   Alcohol use: No    Alcohol/week: 0.0 standard drinks of alcohol   Drug use: Yes    Types: Marijuana   Sexual activity: Yes    Comment: per pt's blue health form - 1 sex partner in last 12 months

## 2022-09-11 NOTE — Telephone Encounter (Signed)
Michael [name] from [facility] called stating that the oxycodone 5 mg capsules that were sent in is being denied by patients insurance and would like to know if you could send over a new RX for oxycodone tablets instead. CB # [phone]

## 2022-09-11 NOTE — Telephone Encounter (Signed)
Pt is leaving appt and asking if medication was changed yet. Can we please change it to tablets  CB [phone]

## 2022-09-11 NOTE — Telephone Encounter (Signed)
New rx sent to pharm

## 2022-09-11 NOTE — Telephone Encounter (Signed)
I called and advised pt that we got the message from pharmacy about 15 mins ago and that the Dr. Is out to lunch and as soon as he gets back I will have him address it. He states that he understands this.

## 2022-09-11 NOTE — Patient Instructions (Signed)
Avoid bending, stooping and avoid lifting weights greater than 10 lbs. Avoid prolong standing and walking. Order for a new walker with wheels. Surgery scheduling secretary Kandice Hams, will call you in the next week to schedule for surgery.  Surgery recommended is a one level lumbar fusion L5-S1 this would be done with rods, screws and cages with local bone graft and allograft (donor bone graft). Take hydrocodone for for pain. Risk of surgery includes risk of infection 1 in 200 patients, bleeding 1/2% chance you would need a transfusion.   Risk to the nerves is one in 10,000. You will need to use a brace for 3 months and wean from the brace on the 4th month. Expect improved walking and standing tolerance. Expect relief of leg pain but numbness may persist depending on the length and degree of pressure that has been present. Oxycodone for pain.

## 2022-09-15 ENCOUNTER — Ambulatory Visit: Payer: Medicare Other | Attending: Specialist | Admitting: Occupational Therapy

## 2022-09-22 ENCOUNTER — Ambulatory Visit: Payer: Medicare Other | Admitting: Occupational Therapy

## 2022-10-09 ENCOUNTER — Encounter: Payer: Self-pay | Admitting: Specialist

## 2022-10-09 ENCOUNTER — Ambulatory Visit (INDEPENDENT_AMBULATORY_CARE_PROVIDER_SITE_OTHER): Payer: Medicare Other | Admitting: Specialist

## 2022-10-09 VITALS — BP 173/119 | HR 79 | Ht 70.0 in | Wt 269.0 lb

## 2022-10-09 DIAGNOSIS — R200 Anesthesia of skin: Secondary | ICD-10-CM

## 2022-10-09 DIAGNOSIS — M5136 Other intervertebral disc degeneration, lumbar region: Secondary | ICD-10-CM | POA: Diagnosis not present

## 2022-10-09 DIAGNOSIS — R109 Unspecified abdominal pain: Secondary | ICD-10-CM | POA: Diagnosis not present

## 2022-10-09 DIAGNOSIS — R202 Paresthesia of skin: Secondary | ICD-10-CM

## 2022-10-09 DIAGNOSIS — G8929 Other chronic pain: Secondary | ICD-10-CM

## 2022-10-09 DIAGNOSIS — Z8679 Personal history of other diseases of the circulatory system: Secondary | ICD-10-CM | POA: Diagnosis not present

## 2022-10-09 DIAGNOSIS — Z87442 Personal history of urinary calculi: Secondary | ICD-10-CM

## 2022-10-09 DIAGNOSIS — M51369 Other intervertebral disc degeneration, lumbar region without mention of lumbar back pain or lower extremity pain: Secondary | ICD-10-CM

## 2022-10-09 NOTE — Patient Instructions (Addendum)
Plan: Avoid frequent bending and stooping  No lifting greater than 10 lbs. May use ice or moist heat for pain. Weight loss is of benefit. Best medication for lumbar disc disease is arthritis medications like motrin, celebrex and naprosyn. Exercise is important to improve your indurance and does allow people to function better inspite of back pain.  I have considered surgery as a solution for your back pain but there are medical conditions that need to be assessed and considered before surgery can be scheduled and these include cardiology evaluation and control of your hypertension. Recommend that intervention be considered but it needs to be done safely with risks at lowest level concerning  Heart and hypertension.  In the past I have not been able to schedule surgery without cardiology evaluation and this has not changed, when you have scheduled an appointment to see cardiology and to see your primary care I will prescribe meds to treat your pain but we are not a pain management program and meds for pain a will not be given unless a  Surgical plan to stop the pain and consequently the narcotics is able to be made.

## 2022-10-09 NOTE — Progress Notes (Signed)
Office Visit Note   Patient: Michael [name]           Date of Birth: 01-09-1971           MRN: [id number] Visit Date: 10/09/2022              Requested by: [name], NP [place],  [place] [id number] PCP: [name], NP   Assessment & Plan: Visit Diagnoses:  1. Lumbar degenerative disc disease   2. Numbness and tingling of both upper extremities   3. Other intervertebral disc degeneration, lumbar region   4. Right flank pain, chronic   5. History of uncontrolled hypertension   6. History of nephrolithiasis     Plan: Avoid frequent bending and stooping  No lifting greater than 10 lbs. May use ice or moist heat for pain. Weight loss is of benefit. Best medication for lumbar disc disease is arthritis medications like motrin, celebrex and naprosyn. Exercise is important to improve your indurance and does allow people to function better inspite of back pain.  I have considered surgery as a solution for your back pain but there are medical conditions that need to be assessed and considered before surgery can be scheduled and these include cardiology evaluation and control of your hypertension. Recommend that intervention be considered but it needs to be done safely with risks at lowest level concerning  Heart and hypertension.   Follow-Up Instructions: No follow-ups on file.   Orders:  No orders of the defined types were placed in this encounter.  No orders of the defined types were placed in this encounter.     Procedures: No procedures performed   Clinical Data: No additional findings.   Subjective: No chief complaint on file.   51 year old male with long history of back pain and radiation into buttocks and thighs right more than left. MRI with subarticular stenosis L4-5.  Has history of congestive heart failure and sleep apnea, obesity and hypertension. Complains today of right flank pain. History of nephrolithiasis in the past. Pain  is worse with standing and walking and sitting, bending, and stooping and twisting as in raking the yard and mopping. He has been seen by his primary care in the past but had severe back pain that limited his walking and going for an appointment. No bowel or bladder incontinence.     Review of Systems  Constitutional: Negative.   HENT: Negative.    Eyes: Negative.   Respiratory: Negative.    Cardiovascular: Negative.   Gastrointestinal: Negative.   Endocrine: Negative.   Genitourinary: Negative.   Musculoskeletal: Negative.   Skin: Negative.   Allergic/Immunologic: Negative.   Neurological: Negative.   Hematological: Negative.   Psychiatric/Behavioral: Negative.       Objective: Vital Signs: BP (!) 173/119   Pulse 79   Physical Exam Constitutional:      Appearance: He is well-developed.  HENT:     Head: Normocephalic and atraumatic.  Eyes:     Pupils: Pupils are equal, round, and reactive to light.  Pulmonary:     Effort: Pulmonary effort is normal.     Breath sounds: Normal breath sounds.  Abdominal:     General: Bowel sounds are normal.     Palpations: Abdomen is soft.  Musculoskeletal:     Cervical back: Normal range of motion and neck supple.     Lumbar back: Negative right straight leg raise test and negative left straight leg raise test.  Skin:    General: Skin is warm and dry.  Neurological:     Mental Status: He is alert and oriented to person, place, and time.  Psychiatric:        Behavior: Behavior normal.        Thought Content: Thought content normal.        Judgment: Judgment normal.     Back Exam   Tenderness  The patient is experiencing tenderness in the lumbar.  Range of Motion  Extension:  abnormal  Flexion:  abnormal  Lateral bend right:  abnormal  Lateral bend left:  abnormal  Rotation right:  abnormal  Rotation left:  abnormal   Muscle Strength  Right Quadriceps:  5/5  Left Quadriceps:  5/5  Right Hamstrings:  5/5  Left  Hamstrings:  5/5   Tests  Straight leg raise right: negative Straight leg raise left: negative  Other  Toe walk: normal Heel walk: normal Sensation: normal Gait: abnormal   Comments:  Pain is right CVA and likely this is not due to L5-S1 disc degeneration, viseral pain or stone is a possibility.       Specialty Comments:  No specialty comments available.  Imaging: No results found.   PMFS History: Patient Active Problem List   Diagnosis Date Noted   Acute non-recurrent maxillary sinusitis 12/05/2021   CHF (congestive heart failure), NYHA class I, acute, diastolic ([place]) 93/81/8299   Central sleep apnea comorbid with prescribed opioid use 09/21/2019   History of uncontrolled hypertension 09/21/2019   Complex sleep apnea syndrome 08/18/2019   Claustrophobia 08/18/2019   Anxiety associated with depression 08/18/2019   Sleep disturbance 10/01/2018   Low HDL (under 40) 05/11/2017   Moderate major depression ([place]) 05/11/2017   Hypokalemia 04/04/2017   Morbid obesity ([place]) 04/04/2017   Chest wall pain 04/04/2017   Asthma 02/13/2016   Non compliance with medical treatment 06/13/2015   OSA (obstructive sleep apnea) 04/06/2013   Abnormal EKG 11/08/2012   Essential hypertension    HTN (hypertension), malignant 04/29/2012   Migraine headache with aura 04/29/2012   Past Medical History:  Diagnosis Date   Anxiety    Depression    Questionable per records. Not on any medication.    History of kidney stones    Hx of echocardiogram    a. Echo 12/13/12: Mild LVH, EF 37-16%, grade 1 diastolic dysfunction, mild LAE, mild RVE, mild RAE   Hypertension    Insomnia    Left knee pain    Low back pain    Migraine    Noncompliance    OSA (obstructive sleep apnea)    a. Sleep Study 02/2013:  mod OSA, AHI 33 per hour, O2 sat nadir 85%    Family History  Problem Relation Age of Onset   Cancer Father        prostate, colon- age was in his 34's   Heart disease Father 69       MI     Hypertension Father    Colon cancer Father    Diabetes Mother    Hypertension Mother    Hypertension Brother    Cancer Maternal Grandfather    Cancer Paternal Grandfather    Esophageal cancer Neg Hx    Stomach cancer Neg Hx    Rectal cancer Neg Hx     Past Surgical History:  Procedure Laterality Date   Admission  04/2012   Malignant HTN, HA.  CT head negative, cardiology consult.     CYSTOSCOPY WITH  RETROGRADE PYELOGRAM, URETEROSCOPY AND STENT PLACEMENT Left 01/24/2019   Procedure: CYSTOSCOPY WITH RETROGRADE PYELOGRAM, URETEROSCOPY AND STENT PLACEMENT;  Surgeon: Cleon Gustin, MD;  Location: [facility];  Service: Urology;  Laterality: Left;   HOLMIUM LASER APPLICATION Left 8/88/9169   Procedure: HOLMIUM LASER APPLICATION;  Surgeon: Cleon Gustin, MD;  Location: [facility];  Service: Urology;  Laterality: Left;   MANDIBLE SURGERY  1998   metal plate   Social History   Occupational History   Occupation: Unemployed//disabled  Tobacco Use   Smoking status: Never   Smokeless tobacco: Never  Vaping Use   Vaping Use: Never used  Substance and Sexual Activity   Alcohol use: No    Alcohol/week: 0.0 standard drinks of alcohol   Drug use: Yes    Types: Marijuana   Sexual activity: Yes    Comment: per pt's blue health form - 1 sex partner in last 12 months

## 2022-10-29 NOTE — Progress Notes (Deleted)
Cardiology Office Note:    Date:  10/29/2022   ID:  Michael [name], DOB [birth year], MRN [id number]  PCP:  [name], NP   [facility] Cardiologist:  Fransico Him, MD {     Referring MD: [name], NP   No chief complaint on file. ***  History of Present Illness:    [name] is a 51 y.o. male with a hx of ***  Past Medical History:  Diagnosis Date   Anxiety    Depression    Questionable per records. Not on any medication.    History of kidney stones    Hx of echocardiogram    a. Echo 12/13/12: Mild LVH, EF 37-62%, grade 1 diastolic dysfunction, mild LAE, mild RVE, mild RAE   Hypertension    Insomnia    Left knee pain    Low back pain    Migraine    Noncompliance    OSA (obstructive sleep apnea)    a. Sleep Study 02/2013:  mod OSA, AHI 33 per hour, O2 sat nadir 85%    Past Surgical History:  Procedure Laterality Date   Admission  04/2012   Malignant HTN, HA.  CT head negative, cardiology consult.     CYSTOSCOPY WITH RETROGRADE PYELOGRAM, URETEROSCOPY AND STENT PLACEMENT Left 01/24/2019   Procedure: CYSTOSCOPY WITH RETROGRADE PYELOGRAM, URETEROSCOPY AND STENT PLACEMENT;  Surgeon: Cleon Gustin, MD;  Location: [facility];  Service: Urology;  Laterality: Left;   HOLMIUM LASER APPLICATION Left 08/29/5175   Procedure: HOLMIUM LASER APPLICATION;  Surgeon: Cleon Gustin, MD;  Location: [facility];  Service: Urology;  Laterality: Left;   MANDIBLE SURGERY  1998   metal plate    Current Medications: No outpatient medications have been marked as taking for the 10/31/22 encounter (Appointment) with Bethzy Hauck, Wonda Cheng, MD.   Current Facility-Administered Medications for the 10/31/22 encounter (Appointment) with Johnluke Haugen, Wonda Cheng, MD  Medication   0.9 %  sodium chloride infusion     Allergies:   Statins   Social History   Socioeconomic History   Marital status: Single    Spouse name: Not on file    Number of children: 4   Years of education: Not on file   Highest education level: Not on file  Occupational History   Occupation: Unemployed//disabled  Tobacco Use   Smoking status: Never   Smokeless tobacco: Never  Vaping Use   Vaping Use: Never used  Substance and Sexual Activity   Alcohol use: No    Alcohol/week: 0.0 standard drinks of alcohol   Drug use: Yes    Types: Marijuana   Sexual activity: Yes    Comment: per pt's blue health form - 1 sex partner in last 12 months  Other Topics Concern   Not on file  Social History Narrative   Marital status: single; living with fiance      Children: 4 children, no grandchildren.      Employment:  Disability for learning disabilities since 2012.   Right-handed   Caffeine: occasional         Social Determinants of Health   Financial Resource Strain: High Risk (09/08/2021)   Overall Financial Resource Strain (CARDIA)    Difficulty of Paying Living Expenses: Very hard  Food Insecurity: No Food Insecurity (09/08/2021)   Hunger Vital Sign    Worried About Running Out of Food in the Last Year: Never true    Ran Out of Food in the Last  Year: Never true  Transportation Needs: No Transportation Needs (09/08/2021)   PRAPARE - Hydrologist (Medical): No    Lack of Transportation (Non-Medical): No  Physical Activity: Inactive (09/08/2021)   Exercise Vital Sign    Days of Exercise per Week: 0 days    Minutes of Exercise per Session: 0 min  Stress: Stress Concern Present (09/08/2021)   [place]    Feeling of Stress : To some extent  Social Connections: Socially Isolated (09/08/2021)   Social Connection and Isolation Panel [NHANES]    Frequency of Communication with Friends and Family: More than three times a week    Frequency of Social Gatherings with Friends and Family: Once a week    Attends Religious Services: Never    Marine scientist  or Organizations: No    Attends Music therapist: Never    Marital Status: Never married     Family History: The patient's ***family history includes Cancer in his father, maternal grandfather, and paternal grandfather; Colon cancer in his father; Diabetes in his mother; Heart disease (age of onset: 75) in his father; Hypertension in his brother, father, and mother. There is no history of Esophageal cancer, Stomach cancer, or Rectal cancer.  ROS:   Please see the history of present illness.    *** All other systems reviewed and are negative.  EKGs/Labs/Other Studies Reviewed:    The following studies were reviewed today: ***  EKG:  EKG is *** ordered today.  The ekg ordered today demonstrates ***  Recent Labs: No results found for requested labs within last 365 days.  Recent Lipid Panel    Component Value Date/Time   CHOL 159 03/22/2020 1550   TRIG 79 03/22/2020 1550   HDL 44 03/22/2020 1550   CHOLHDL 3.6 03/22/2020 1550   CHOLHDL 3.2 06/13/2015 1118   VLDL 8 06/13/2015 1118   LDLCALC 100 (H) 03/22/2020 1550     Risk Assessment/Calculations:   {Does this patient have ATRIAL FIBRILLATION?:[phone]}  No BP recorded.  {Refresh Note OR Click here to enter BP  :1}***         Physical Exam:    VS:  There were no vitals taken for this visit.    Wt Readings from Last 3 Encounters:  10/09/22 269 lb (122 kg)  09/11/22 269 lb (122 kg)  07/23/22 269 lb (122 kg)     GEN: *** Well nourished, well developed in no acute distress HEENT: Normal NECK: No JVD; No carotid bruits LYMPHATICS: No lymphadenopathy CARDIAC: ***RRR, no murmurs, rubs, gallops RESPIRATORY:  Clear to auscultation without rales, wheezing or rhonchi  ABDOMEN: Soft, non-tender, non-distended MUSCULOSKELETAL:  No edema; No deformity  SKIN: Warm and dry NEUROLOGIC:  Alert and oriented x 3 PSYCHIATRIC:  Normal affect   ASSESSMENT:    No diagnosis found. PLAN:    In order of problems  listed above:  ***      {Are you ordering a CV Procedure (e.g. stress test, cath, DCCV, TEE, etc)?   Press F2        :[id number]}    Medication Adjustments/Labs and Tests Ordered: Current medicines are reviewed at length with the patient today.  Concerns regarding medicines are outlined above.  No orders of the defined types were placed in this encounter.  No orders of the defined types were placed in this encounter.   There are no Patient Instructions on file for this visit.  Signed, Mertie Moores, MD  10/29/2022 7:30 PM    [place]

## 2022-10-31 ENCOUNTER — Ambulatory Visit: Payer: Medicare Other | Admitting: Cardiovascular Disease

## 2022-11-10 NOTE — Progress Notes (Deleted)
Cardiology Office Note:    Date:  11/10/2022   ID:  Michael [name], DOB [birth year], MRN [id number]  PCP:  [name], NP  Cardiologist:  Fransico Him, MD  Electrophysiologist:  None   Referring MD: [name], NP   No chief complaint on file. ***  History of Present Illness:    Michael [name] is a 51 y.o. male with a hx of hypertension, OSA who is referred by Michael Fruits, NP for preop evaluation.  Previously seen by Dr. Radford Pax, last seen 05/08/2017.  Echocardiogram 05/21/2017 showed moderate LVH, EF 60 to 65%, normal RV function, no significant valvular disease.  Lexiscan Myoview 05/22/2017 showed normal perfusion, EF 54%.  Past Medical History:  Diagnosis Date   Anxiety    Depression    Questionable per records. Not on any medication.    History of kidney stones    Hx of echocardiogram    a. Echo 12/13/12: Mild LVH, EF 93-81%, grade 1 diastolic dysfunction, mild LAE, mild RVE, mild RAE   Hypertension    Insomnia    Left knee pain    Low back pain    Migraine    Noncompliance    OSA (obstructive sleep apnea)    a. Sleep Study 02/2013:  mod OSA, AHI 33 per hour, O2 sat nadir 85%    Past Surgical History:  Procedure Laterality Date   Admission  04/2012   Malignant HTN, HA.  CT head negative, cardiology consult.     CYSTOSCOPY WITH RETROGRADE PYELOGRAM, URETEROSCOPY AND STENT PLACEMENT Left 01/24/2019   Procedure: CYSTOSCOPY WITH RETROGRADE PYELOGRAM, URETEROSCOPY AND STENT PLACEMENT;  Surgeon: Cleon Gustin, MD;  Location: [facility];  Service: Urology;  Laterality: Left;   HOLMIUM LASER APPLICATION Left 0/17/5102   Procedure: HOLMIUM LASER APPLICATION;  Surgeon: Cleon Gustin, MD;  Location: [facility];  Service: Urology;  Laterality: Left;   MANDIBLE SURGERY  1998   metal plate    Current Medications: No outpatient medications have been marked as taking for the 11/11/22 encounter (Appointment) with Donato Heinz,  MD.   Current Facility-Administered Medications for the 11/11/22 encounter (Appointment) with Donato Heinz, MD  Medication   0.9 %  sodium chloride infusion     Allergies:   Statins   Social History   Socioeconomic History   Marital status: Single    Spouse name: Not on file   Number of children: 4   Years of education: Not on file   Highest education level: Not on file  Occupational History   Occupation: Unemployed//disabled  Tobacco Use   Smoking status: Never   Smokeless tobacco: Never  Vaping Use   Vaping Use: Never used  Substance and Sexual Activity   Alcohol use: No    Alcohol/week: 0.0 standard drinks of alcohol   Drug use: Yes    Types: Marijuana   Sexual activity: Yes    Comment: per pt's blue health form - 1 sex partner in last 12 months  Other Topics Concern   Not on file  Social History Narrative   Marital status: single; living with fiance      Children: 4 children, no grandchildren.      Employment:  Disability for learning disabilities since 2012.   Right-handed   Caffeine: occasional         Social Determinants of Health   Financial Resource Strain: High Risk (09/08/2021)   Overall Financial Resource Strain (CARDIA)    Difficulty of  Paying Living Expenses: Very hard  Food Insecurity: No Food Insecurity (09/08/2021)   Hunger Vital Sign    Worried About Running Out of Food in the Last Year: Never true    Ran Out of Food in the Last Year: Never true  Transportation Needs: No Transportation Needs (09/08/2021)   PRAPARE - Hydrologist (Medical): No    Lack of Transportation (Non-Medical): No  Physical Activity: Inactive (09/08/2021)   Exercise Vital Sign    Days of Exercise per Week: 0 days    Minutes of Exercise per Session: 0 min  Stress: Stress Concern Present (09/08/2021)   [place]    Feeling of Stress : To some extent  Social Connections:  Socially Isolated (09/08/2021)   Social Connection and Isolation Panel [NHANES]    Frequency of Communication with Friends and Family: More than three times a week    Frequency of Social Gatherings with Friends and Family: Once a week    Attends Religious Services: Never    Marine scientist or Organizations: No    Attends Music therapist: Never    Marital Status: Never married     Family History: The patient's ***family history includes Cancer in his father, maternal grandfather, and paternal grandfather; Colon cancer in his father; Diabetes in his mother; Heart disease (age of onset: 103) in his father; Hypertension in his brother, father, and mother. There is no history of Esophageal cancer, Stomach cancer, or Rectal cancer.  ROS:   Please see the history of present illness.    *** All other systems reviewed and are negative.  EKGs/Labs/Other Studies Reviewed:    The following studies were reviewed today: ***  EKG:  EKG is *** ordered today.  The ekg ordered today demonstrates ***  Recent Labs: No results found for requested labs within last 365 days.  Recent Lipid Panel    Component Value Date/Time   CHOL 159 03/22/2020 1550   TRIG 79 03/22/2020 1550   HDL 44 03/22/2020 1550   CHOLHDL 3.6 03/22/2020 1550   CHOLHDL 3.2 06/13/2015 1118   VLDL 8 06/13/2015 1118   LDLCALC 100 (H) 03/22/2020 1550    Physical Exam:    VS:  There were no vitals taken for this visit.    Wt Readings from Last 3 Encounters:  10/09/22 269 lb (122 kg)  09/11/22 269 lb (122 kg)  07/23/22 269 lb (122 kg)     GEN: *** Well nourished, well developed in no acute distress HEENT: Normal NECK: No JVD; No carotid bruits LYMPHATICS: No lymphadenopathy CARDIAC: ***RRR, no murmurs, rubs, gallops RESPIRATORY:  Clear to auscultation without rales, wheezing or rhonchi  ABDOMEN: Soft, non-tender, non-distended MUSCULOSKELETAL:  No edema; No deformity  SKIN: Warm and dry NEUROLOGIC:   Alert and oriented x 3 PSYCHIATRIC:  Normal affect   ASSESSMENT:    No diagnosis found. PLAN:    Preop evaluation:   Hypertension: On amlodipine 10 mg daily, carvedilol 6.25 mg twice daily, chlorthalidone 50 mg daily, lisinopril 40 mg daily, propranolol 80 mg daily  Morbid obesity:  OSA:  RTC in***   Medication Adjustments/Labs and Tests Ordered: Current medicines are reviewed at length with the patient today.  Concerns regarding medicines are outlined above.  No orders of the defined types were placed in this encounter.  No orders of the defined types were placed in this encounter.   There are no Patient Instructions on  file for this visit.   Signed, Donato Heinz, MD  11/10/2022 10:03 PM    [place]

## 2022-11-11 ENCOUNTER — Ambulatory Visit: Payer: Medicare Other | Admitting: Cardiology

## 2022-11-19 ENCOUNTER — Ambulatory Visit: Payer: Medicare Other | Admitting: Orthopedic Surgery

## 2022-11-27 ENCOUNTER — Encounter: Payer: Self-pay | Admitting: Cardiology

## 2022-11-27 ENCOUNTER — Ambulatory Visit: Payer: Medicare Other | Attending: Cardiology | Admitting: Cardiology

## 2022-11-27 VITALS — BP 122/88 | HR 80 | Ht 70.0 in | Wt 285.8 lb

## 2022-11-27 DIAGNOSIS — Z01818 Encounter for other preprocedural examination: Secondary | ICD-10-CM

## 2022-11-27 DIAGNOSIS — I10 Essential (primary) hypertension: Secondary | ICD-10-CM | POA: Diagnosis not present

## 2022-11-27 DIAGNOSIS — Z13220 Encounter for screening for lipoid disorders: Secondary | ICD-10-CM | POA: Diagnosis not present

## 2022-11-27 DIAGNOSIS — R0609 Other forms of dyspnea: Secondary | ICD-10-CM | POA: Diagnosis not present

## 2022-11-27 DIAGNOSIS — I517 Cardiomegaly: Secondary | ICD-10-CM

## 2022-11-27 MED ORDER — METOPROLOL TARTRATE 100 MG PO TABS
ORAL_TABLET | ORAL | 0 refills | Status: DC
Start: 2022-11-27 — End: 2023-05-19

## 2022-11-27 NOTE — Patient Instructions (Signed)
Medication Instructions:  Your physician recommends that you continue on your current medications as directed. Please refer to the Current Medication list given to you today.  MORNING OF CT SCAN-HOLD Coreg, take metoprolol (100 mg) two hours prior to test.   *If you need a refill on your cardiac medications before your next appointment, please call your pharmacy*   Lab Work: BMET, Lipid today  If you have labs (blood work) drawn today and your tests are completely normal, you will receive your results only by: [place] (if you have MyChart) OR A paper copy in the mail If you have any lab test that is abnormal or we need to change your treatment, we will call you to review the results.   Testing/Procedures: Coronary CTA-see instructions below  Your physician has requested that you have an echocardiogram. Echocardiography is a painless test that uses sound waves to create images of your heart. It provides your doctor with information about the size and shape of your heart and how well your heart's chambers and valves are working. This procedure takes approximately one hour. There are no restrictions for this procedure. Please do NOT wear cologne, perfume, aftershave, or lotions (deodorant is allowed). Please arrive 15 minutes prior to your appointment time.   Follow-Up: At [facility], you and your health needs are our priority.  As part of our continuing mission to provide you with exceptional heart care, we have created designated Provider Care Teams.  These Care Teams include your primary Cardiologist (physician) and Advanced Practice Providers (APPs -  Physician Assistants and Nurse Practitioners) who all work together to provide you with the care you need, when you need it.  We recommend signing up for the patient portal called "MyChart".  Sign up information is provided on this After Visit Summary.  MyChart is used to connect with patients for Virtual Visits  (Telemedicine).  Patients are able to view lab/test results, encounter notes, upcoming appointments, etc.  Non-urgent messages can be sent to your provider as well.   To learn more about what you can do with MyChart, go to [email or web address].    Your next appointment:   3 month(s)  The format for your next appointment:   In Person  Provider:   Dr. Gardiner Rhyme Other Instructions   Your cardiac CT will be scheduled at one of the below locations:   [facility] [address] [place], [place] [address] [phone]  If scheduled at [facility], please arrive at the [facility] and Children's Entrance (Entrance C2) of [facility] 30 minutes prior to test start time. You can use the FREE valet parking offered at entrance C (encouraged to control the heart rate for the test)  Proceed to the [facility] Radiology Department (first floor) to check-in and test prep.  All radiology patients and guests should use entrance C2 at [place], accessed from [facility], even though the hospital's physical address listed is [address]     Please follow these instructions carefully (unless otherwise directed):  Hold all erectile dysfunction medications at least 3 days (72 hrs) prior to test. (Ie viagra, cialis, sildenafil, tadalafil, etc) We will administer nitroglycerin during this exam.   On the Night Before the Test: Be sure to Drink plenty of water. Do not consume any caffeinated/decaffeinated beverages or chocolate 12 hours prior to your test. Do not take any antihistamines 12 hours prior to your test.  On the Day of the  Test: Drink plenty of water until 1 hour prior to the test. Do not eat any food 1 hour prior to test. You may take your regular medications prior to the test.  Hold coreg morning of test Take metoprolol 100 mg (Lopressor) two hours prior to test.      After the Test: Drink plenty of water. After receiving  IV contrast, you may experience a mild flushed feeling. This is normal. On occasion, you may experience a mild rash up to 24 hours after the test. This is not dangerous. If this occurs, you can take Benadryl 25 mg and increase your fluid intake. If you experience trouble breathing, this can be serious. If it is severe call 911 IMMEDIATELY. If it is mild, please call our office. If you take any of these medications: Glipizide/Metformin, Avandament, Glucavance, please do not take 48 hours after completing test unless otherwise instructed.  We will call to schedule your test 2-4 weeks out understanding that some insurance companies will need an authorization prior to the service being performed.   For non-scheduling related questions, please contact the cardiac imaging nurse navigator should you have any questions/concerns: Marchia Bond, Cardiac Imaging Nurse Navigator Gordy Clement, Cardiac Imaging Nurse Navigator [facility] Direct Office Dial: [phone]   For scheduling needs, including cancellations and rescheduling, please call [place], [phone].

## 2022-11-27 NOTE — Progress Notes (Signed)
Cardiology Office Note:    Date:  11/27/2022   ID:  Michael [name], DOB 05-11-1971, MRN [id number]  PCP:  [name], NP  Cardiologist:  Fransico Him, MD  Electrophysiologist:  None   Referring MD: [name], NP   Chief Complaint  Patient presents with   Pre-op Exam    History of Present Illness:    Michael [name] is a 51 y.o. male with a hx of hypertension, OSA who is referred by Durene Fruits, NP for preop evaluation prior to back surgery.  Previously seen by Dr. Radford Pax, last seen 05/08/2017.  He reports he has been having shortness of breath, particularly notices it when walking up stairs.  Does not exercise much, limited by back pain.  Denies any chest pain, lightheadedness, syncope, lower extremity edema, or palpitations.  Smoked occasionally as a teenager/early 20s but none since.  Family history includes father had MI in 53s.  Echocardiogram 05/21/2017 showed moderate LVH, EF 60 to 65%, normal RV function, no significant valvular disease.  Lexiscan Myoview 05/22/2017 showed normal perfusion, EF 54%.  Past Medical History:  Diagnosis Date   Anxiety    Depression    Questionable per records. Not on any medication.    History of kidney stones    Hx of echocardiogram    a. Echo 12/13/12: Mild LVH, EF 64-15%, grade 1 diastolic dysfunction, mild LAE, mild RVE, mild RAE   Hypertension    Insomnia    Left knee pain    Low back pain    Migraine    Noncompliance    OSA (obstructive sleep apnea)    a. Sleep Study 02/2013:  mod OSA, AHI 33 per hour, O2 sat nadir 85%    Past Surgical History:  Procedure Laterality Date   Admission  04/2012   Malignant HTN, HA.  CT head negative, cardiology consult.     CYSTOSCOPY WITH RETROGRADE PYELOGRAM, URETEROSCOPY AND STENT PLACEMENT Left 01/24/2019   Procedure: CYSTOSCOPY WITH RETROGRADE PYELOGRAM, URETEROSCOPY AND STENT PLACEMENT;  Surgeon: Cleon Gustin, MD;  Location: [facility];  Service: Urology;  Laterality:  Left;   HOLMIUM LASER APPLICATION Left 08/27/9406   Procedure: HOLMIUM LASER APPLICATION;  Surgeon: Cleon Gustin, MD;  Location: [facility];  Service: Urology;  Laterality: Left;   MANDIBLE SURGERY  1998   metal plate    Current Medications: Current Meds  Medication Sig   allopurinol (ZYLOPRIM) 100 MG tablet Take 1 tablet (100 mg total) by mouth daily.   amLODipine (NORVASC) 10 MG tablet Take 1 tablet (10 mg total) by mouth daily.   carvedilol (COREG) 6.25 MG tablet Take 1 tablet by mouth twice daily   DULoxetine (CYMBALTA) 60 MG capsule Take 60 mg by mouth 2 (two) times daily.   losartan (COZAAR) 50 MG tablet Take 50 mg by mouth daily.   metoprolol tartrate (LOPRESSOR) 100 MG tablet Take 1 tablet ('100mg'$ ) TWO hours prior to CT scan   Current Facility-Administered Medications for the 11/27/22 encounter (Office Visit) with Donato Heinz, MD  Medication   0.9 %  sodium chloride infusion     Allergies:   Statins   Social History   Socioeconomic History   Marital status: Single    Spouse name: Not on file   Number of children: 4   Years of education: Not on file   Highest education level: Not on file  Occupational History   Occupation: Unemployed//disabled  Tobacco Use   Smoking status: Never  Smokeless tobacco: Never  Vaping Use   Vaping Use: Never used  Substance and Sexual Activity   Alcohol use: No    Alcohol/week: 0.0 standard drinks of alcohol   Drug use: Yes    Types: Marijuana   Sexual activity: Yes    Comment: per pt's blue health form - 1 sex partner in last 12 months  Other Topics Concern   Not on file  Social History Narrative   Marital status: single; living with fiance      Children: 4 children, no grandchildren.      Employment:  Disability for learning disabilities since 2012.   Right-handed   Caffeine: occasional         Social Determinants of Health   Financial Resource Strain: High Risk (09/08/2021)   Overall  Financial Resource Strain (CARDIA)    Difficulty of Paying Living Expenses: Very hard  Food Insecurity: No Food Insecurity (09/08/2021)   Hunger Vital Sign    Worried About Running Out of Food in the Last Year: Never true    Ran Out of Food in the Last Year: Never true  Transportation Needs: No Transportation Needs (09/08/2021)   PRAPARE - Hydrologist (Medical): No    Lack of Transportation (Non-Medical): No  Physical Activity: Inactive (09/08/2021)   Exercise Vital Sign    Days of Exercise per Week: 0 days    Minutes of Exercise per Session: 0 min  Stress: Stress Concern Present (09/08/2021)   [place]    Feeling of Stress : To some extent  Social Connections: Socially Isolated (09/08/2021)   Social Connection and Isolation Panel [NHANES]    Frequency of Communication with Friends and Family: More than three times a week    Frequency of Social Gatherings with Friends and Family: Once a week    Attends Religious Services: Never    Marine scientist or Organizations: No    Attends Music therapist: Never    Marital Status: Never married     Family History: The patient's family history includes Cancer in his father, maternal grandfather, and paternal grandfather; Colon cancer in his father; Diabetes in his mother; Heart disease (age of onset: 42) in his father; Hypertension in his brother, father, and mother. There is no history of Esophageal cancer, Stomach cancer, or Rectal cancer.  ROS:   Please see the history of present illness.     All other systems reviewed and are negative.  EKGs/Labs/Other Studies Reviewed:    The following studies were reviewed today:   EKG:   11/27/2022: Normal sinus rhythm, rate 80, T wave inversions in leads V3-5  Recent Labs: No results found for requested labs within last 365 days.  Recent Lipid Panel    Component Value Date/Time    CHOL 159 03/22/2020 1550   TRIG 79 03/22/2020 1550   HDL 44 03/22/2020 1550   CHOLHDL 3.6 03/22/2020 1550   CHOLHDL 3.2 06/13/2015 1118   VLDL 8 06/13/2015 1118   LDLCALC 100 (H) 03/22/2020 1550    Physical Exam:    VS:  BP 122/88 (BP Location: Left Arm, Patient Position: Sitting, Cuff Size: Large)   Pulse 80   Ht '5\' 10"'$  (1.778 m)   Wt 285 lb 12.8 oz (129.6 kg)   SpO2 98%   BMI 41.01 kg/m     Wt Readings from Last 3 Encounters:  11/27/22 285 lb 12.8 oz (129.6 kg)  10/09/22 269 lb (122 kg)  09/11/22 269 lb (122 kg)     GEN:  Well nourished, well developed in no acute distress HEENT: Normal NECK: No JVD; No carotid bruits LYMPHATICS: No lymphadenopathy CARDIAC: RRR, no murmurs, rubs, gallops RESPIRATORY:  Clear to auscultation without rales, wheezing or rhonchi  ABDOMEN: Soft, non-tender, non-distended MUSCULOSKELETAL:  No edema; No deformity  SKIN: Warm and dry NEUROLOGIC:  Alert and oriented x 3 PSYCHIATRIC:  Normal affect   ASSESSMENT:    1. DOE (dyspnea on exertion)   2. Pre-op evaluation   3. Lipid screening   4. Essential hypertension   5. LVH (left ventricular hypertrophy)   6. Morbid obesity ([place])    PLAN:    DOE: He is reporting dyspnea on exertion, which could represent anginal equivalent.  Does have significant CAD risk factors (age, family history, hypertension).  Recommend coronary CTA to rule out obstructive CAD.  Will give Lopressor 100 mg prior to study  LVH: Moderate LVH noted on echo 04/2017.  Likely due to hypertension.  Check echocardiogram  Preop evaluation: Prior to back surgery.  He is reporting dyspnea on exertion as above.  We will plan coronary CTA and echocardiogram for further evaluation as above  Hypertension: On amlodipine 10 mg daily, carvedilol 6.25 mg twice daily, and losartan 50 mg daily.  Appears controlled  Morbid obesity: Body mass index is 41.01 kg/m.  Diet/exercise encouraged  Lipid screening: Check lipid panel  OSA: on  CPAP, reports compliance.  RTC in 3 months   Medication Adjustments/Labs and Tests Ordered: Current medicines are reviewed at length with the patient today.  Concerns regarding medicines are outlined above.  Orders Placed This Encounter  Procedures   CT CORONARY MORPH W/CTA COR W/SCORE W/CA W/CM &/OR WO/CM   Basic metabolic panel   Lipid panel   EKG 12-Lead   ECHOCARDIOGRAM COMPLETE   Meds ordered this encounter  Medications   metoprolol tartrate (LOPRESSOR) 100 MG tablet    Sig: Take 1 tablet ('100mg'$ ) TWO hours prior to CT scan    Dispense:  1 tablet    Refill:  0    Patient Instructions  Medication Instructions:  Your physician recommends that you continue on your current medications as directed. Please refer to the Current Medication list given to you today.  MORNING OF CT SCAN-HOLD Coreg, take metoprolol (100 mg) two hours prior to test.   *If you need a refill on your cardiac medications before your next appointment, please call your pharmacy*   Lab Work: BMET, Lipid today  If you have labs (blood work) drawn today and your tests are completely normal, you will receive your results only by: [place] (if you have MyChart) OR A paper copy in the mail If you have any lab test that is abnormal or we need to change your treatment, we will call you to review the results.   Testing/Procedures: Coronary CTA-see instructions below  Your physician has requested that you have an echocardiogram. Echocardiography is a painless test that uses sound waves to create images of your heart. It provides your doctor with information about the size and shape of your heart and how well your heart's chambers and valves are working. This procedure takes approximately one hour. There are no restrictions for this procedure. Please do NOT wear cologne, perfume, aftershave, or lotions (deodorant is allowed). Please arrive 15 minutes prior to your appointment time.   Follow-Up: At [facility], you and your health needs are  our priority.  As part of our continuing mission to provide you with exceptional heart care, we have created designated Provider Care Teams.  These Care Teams include your primary Cardiologist (physician) and Advanced Practice Providers (APPs -  Physician Assistants and Nurse Practitioners) who all work together to provide you with the care you need, when you need it.  We recommend signing up for the patient portal called "MyChart".  Sign up information is provided on this After Visit Summary.  MyChart is used to connect with patients for Virtual Visits (Telemedicine).  Patients are able to view lab/test results, encounter notes, upcoming appointments, etc.  Non-urgent messages can be sent to your provider as well.   To learn more about what you can do with MyChart, go to [email or web address].    Your next appointment:   3 month(s)  The format for your next appointment:   In Person  Provider:   Dr. Gardiner Rhyme Other Instructions   Your cardiac CT will be scheduled at one of the below locations:   [facility] [address] [place], [place] [address] [phone]  If scheduled at [facility], please arrive at the [facility] Entrance (Entrance C2) of [facility] 30 minutes prior to test start time. You can use the FREE valet parking offered at entrance C (encouraged to control the heart rate for the test)  Proceed to the [facility] Radiology Department (first floor) to check-in and test prep.  All radiology patients and guests should use entrance C2 at [facility], accessed from [facility], even though the hospital's physical address listed is [address]     Please follow these instructions carefully (unless otherwise directed):  Hold all erectile dysfunction medications at least 3 days (72 hrs) prior to test. (Ie viagra, cialis, sildenafil, tadalafil, etc) We  will administer nitroglycerin during this exam.   On the Night Before the Test: Be sure to Drink plenty of water. Do not consume any caffeinated/decaffeinated beverages or chocolate 12 hours prior to your test. Do not take any antihistamines 12 hours prior to your test.  On the Day of the Test: Drink plenty of water until 1 hour prior to the test. Do not eat any food 1 hour prior to test. You may take your regular medications prior to the test.  Hold coreg morning of test Take metoprolol 100 mg (Lopressor) two hours prior to test.      After the Test: Drink plenty of water. After receiving IV contrast, you may experience a mild flushed feeling. This is normal. On occasion, you may experience a mild rash up to 24 hours after the test. This is not dangerous. If this occurs, you can take Benadryl 25 mg and increase your fluid intake. If you experience trouble breathing, this can be serious. If it is severe call 911 IMMEDIATELY. If it is mild, please call our office. If you take any of these medications: Glipizide/Metformin, Avandament, Glucavance, please do not take 48 hours after completing test unless otherwise instructed.  We will call to schedule your test 2-4 weeks out understanding that some insurance companies will need an authorization prior to the service being performed.   For non-scheduling related questions, please contact the cardiac imaging nurse navigator should you have any questions/concerns: Marchia Bond, Cardiac Imaging Nurse Navigator Gordy Clement, Cardiac Imaging Nurse Navigator [facility] Direct Office Dial: [phone]   For scheduling needs, including cancellations and rescheduling, please  call [place], [phone].         Signed, Donato Heinz, MD  11/27/2022 2:02 PM    [facility]

## 2022-11-28 LAB — LIPID PANEL
Chol/HDL Ratio: 3.4 ratio (ref 0.0–5.0)
Cholesterol, Total: 151 mg/dL (ref 100–199)
HDL: 44 mg/dL (ref >39)
LDL Chol Calc (NIH): 87 mg/dL (ref 0–99)
Triglycerides: 109 mg/dL (ref 0–149)
VLDL Cholesterol Cal: 20 mg/dL (ref 5–40)

## 2022-11-28 LAB — BASIC METABOLIC PANEL
BUN/Creatinine Ratio: 13 (ref 9–20)
BUN: 16 mg/dL (ref 6–24)
CO2: 27 mmol/L (ref 20–29)
Calcium: 9.4 mg/dL (ref 8.7–10.2)
Chloride: 102 mmol/L (ref 96–106)
Creatinine, Ser: 1.25 mg/dL (ref 0.76–1.27)
Glucose: 97 mg/dL (ref 70–99)
Potassium: 4 mmol/L (ref 3.5–5.2)
Sodium: 144 mmol/L (ref 134–144)
eGFR: 69 mL/min/{1.73_m2} (ref >59)

## 2022-12-01 ENCOUNTER — Other Ambulatory Visit: Payer: Self-pay | Admitting: Family Medicine

## 2022-12-04 ENCOUNTER — Encounter: Payer: Self-pay | Admitting: *Deleted

## 2022-12-09 ENCOUNTER — Telehealth: Payer: Self-pay | Admitting: Family

## 2022-12-09 NOTE — Telephone Encounter (Signed)
Left message for patient to call back and schedule Medicare Annual Wellness Visit (AWV) either virtually or phone . Left  my jabber number [phone]   Last AWV 09/08/21 please schedule with Nurse Health Adviser   45 min for awv-i and in office appointments 30 min for awv-s  phone/virtual appointments  

## 2022-12-11 ENCOUNTER — Ambulatory Visit: Payer: Self-pay

## 2022-12-11 ENCOUNTER — Encounter: Payer: Self-pay | Admitting: Orthopedic Surgery

## 2022-12-11 ENCOUNTER — Ambulatory Visit (INDEPENDENT_AMBULATORY_CARE_PROVIDER_SITE_OTHER): Payer: Medicare Other | Admitting: Orthopedic Surgery

## 2022-12-11 VITALS — BP 149/109 | HR 69 | Ht 70.0 in | Wt 286.0 lb

## 2022-12-11 DIAGNOSIS — M5136 Other intervertebral disc degeneration, lumbar region: Secondary | ICD-10-CM

## 2022-12-11 NOTE — Progress Notes (Addendum)
Orthopedic Spine Surgery Office Note  Assessment: Patient is a 51 y.o. male with chronic, stable axial low back pain.  No radicular symptoms   Plan: -Patient has a BMI of 41, so recommended weight loss to help decrease his pain.  We also discussed core strengthening.  He said he has previously done physical therapy but never specifically worked on any core strengthening.  I referred him to physical therapy to work on core strengthening.  I instructed him to do those exercises at home 3-4 times per week on a regular basis -Patient has tried physical therapy, activity modification, Robaxin, steroid injection.  Has not gotten any significant relief with any of these.  Says he is needing to get temporary relief with the steroid injection.  -Referred him to pain management since he has tried all of the medications that I normally prescribe.  Referral was provided to him today. -Patient should return to office on an as-needed basis   Patient expressed understanding of the plan and all questions were answered to the patient's satisfaction.   ___________________________________________________________________________   History:  Patient is a 51 y.o. male who presents today for lumbar spine.  Patient has a several year history of low back pain.  States that it is worse with flexion of the lumbar spine.  He also states that it is worse if he is particularly active.  It gets better if he rests.  There is no trauma or injury that brought on the pain.  Pain is felt in the lower back and in the upper buttock region.  Does not radiate into either leg.  Denies paresthesia numbness.   Weakness: Yes, feels his lower back is weaker.  No other weakness noted Symptoms of imbalance: Denies Paresthesias and numbness: Denies Bowel or bladder incontinence: Denies Saddle anesthesia: Denies  Treatments tried: physical therapy, activity modification, Robaxin, steroid injection  Review of systems: Denies fevers  and chills, night sweats, unexplained weight loss, history of cancer, pain that wakes them at night  Past medical history: Hypertension Depression Anxiety Sleep apnea Chronic pain Migraines  Allergies: Statins  Past surgical history:  Ureter stent placement Mandible surgery  Social history: Reports use of nicotine product (smoking, vaping, patches, smokeless) -couple of cigarettes on the weekends Alcohol use: Denies Reports marijuana use, denies other recreational drug use   Physical Exam:  General: no acute distress, appears stated age Neurologic: alert, answering questions appropriately, following commands Respiratory: unlabored breathing on room air, symmetric chest rise Psychiatric: appropriate affect, normal cadence to speech   MSK (spine):  -Strength exam      Left  Right EHL    5/5  5/5 TA    5/5  5/5 GSC    5/5  5/5 Knee extension  5/5  5/5 Hip flexion   5/5  5/5  -Sensory exam    Sensation intact to light touch in L3-S1 nerve distributions of bilateral lower extremities  -Achilles DTR: 2/4 on the left, 2/4 on the right -Patellar tendon DTR: 1/4 on the left, 1/4 on the right  -Straight leg raise: Negative -Contralateral straight leg raise: Negative -Clonus: no beats bilaterally  -Left hip exam: No pain through range of motion, negative Stinchfield, negative FABER, negative Gaenslen's, negative SI joint compression test -Right hip exam: No pain through range of motion, negative Stinchfield, negative FABER, negative Gaenslen's, negative SI joint compression test  Imaging: XR of the lumbar spine from 12/11/2022 was independently reviewed and interpreted, showing no fracture or dislocation.  Disc height loss  at L5-S1.  No evidence of instability on flexion-extension.  MRI of the lumbar spine from 05/30/2022 was independently reviewed and interpreted, showing bilateral foraminal stenosis at L5-S1. Central disc herniation at L5/S1. Lateral recess stenosis at  L5-S1.  No other significant stenosis. DDD at L5/S1.    Patient name: Michael [name] Patient MRN: [id number] Date of visit: 12/11/22

## 2022-12-12 ENCOUNTER — Telehealth (HOSPITAL_COMMUNITY): Payer: Self-pay | Admitting: Emergency Medicine

## 2022-12-12 NOTE — Telephone Encounter (Signed)
Attempted to call patient regarding upcoming cardiac CT appointment. Left message on voicemail with name and callback number Marchia Bond RN Navigator Cardiac Imaging [facility] [phone] Office [phone] Cell   Reaching out to patient to offer assistance regarding upcoming cardiac imaging study; pt verbalizes understanding of appt date/time, parking situation and where to check in, pre-test NPO status and medications ordered, and verified current allergies; name and call back number provided for further questions should they arise Marchia Bond RN Navigator Cardiac Imaging [facility] [phone] office [phone] cell  Arrival 1130 '100mg'$  metoprolol Aware nitro/contrast Denies iv issues

## 2022-12-15 ENCOUNTER — Ambulatory Visit (HOSPITAL_COMMUNITY)
Admission: RE | Admit: 2022-12-15 | Discharge: 2022-12-15 | Disposition: A | Discharge status: Home / Self Care | Payer: Medicare Other | Source: Ambulatory Visit | Attending: Cardiology | Admitting: Cardiology

## 2022-12-15 DIAGNOSIS — I2510 Atherosclerotic heart disease of native coronary artery without angina pectoris: Secondary | ICD-10-CM | POA: Diagnosis not present

## 2022-12-15 DIAGNOSIS — I517 Cardiomegaly: Secondary | ICD-10-CM

## 2022-12-15 DIAGNOSIS — I10 Essential (primary) hypertension: Secondary | ICD-10-CM

## 2022-12-15 DIAGNOSIS — R0609 Other forms of dyspnea: Secondary | ICD-10-CM | POA: Diagnosis present

## 2022-12-15 MED ORDER — IOHEXOL 350 MG/ML SOLN
100.0000 mL | Freq: Once | INTRAVENOUS | Status: AC | PRN
  Administered 2022-12-15: 100 mL via INTRAVENOUS

## 2022-12-15 MED ORDER — NITROGLYCERIN 0.4 MG SL SUBL
0.8000 mg | SUBLINGUAL_TABLET | Freq: Once | SUBLINGUAL | Status: AC
  Administered 2022-12-15: 0.8 mg via SUBLINGUAL

## 2022-12-15 MED ORDER — NITROGLYCERIN 0.4 MG SL SUBL
SUBLINGUAL_TABLET | SUBLINGUAL | Status: AC
  Filled 2022-12-15: qty 2

## 2022-12-25 ENCOUNTER — Other Ambulatory Visit: Payer: Self-pay | Admitting: *Deleted

## 2022-12-25 ENCOUNTER — Ambulatory Visit (HOSPITAL_COMMUNITY): Payer: Medicare Other | Attending: Cardiology

## 2022-12-25 DIAGNOSIS — R0609 Other forms of dyspnea: Secondary | ICD-10-CM

## 2022-12-25 DIAGNOSIS — I77819 Aortic ectasia, unspecified site: Secondary | ICD-10-CM

## 2022-12-25 LAB — ECHOCARDIOGRAM COMPLETE
Area-P 1/2: 2.91 cm2
S' Lateral: 2.6 cm

## 2022-12-25 MED ORDER — ATORVASTATIN CALCIUM 20 MG PO TABS: 20.0000 mg | ORAL_TABLET | Freq: Every day | ORAL | 3 refills | Status: DC

## 2022-12-30 ENCOUNTER — Encounter: Payer: Self-pay | Admitting: Physical Medicine & Rehabilitation

## 2022-12-31 ENCOUNTER — Other Ambulatory Visit: Payer: Self-pay | Admitting: Family Medicine

## 2023-01-12 ENCOUNTER — Encounter: Payer: Self-pay | Admitting: *Deleted

## 2023-01-20 ENCOUNTER — Other Ambulatory Visit: Payer: Self-pay | Admitting: Radiology

## 2023-01-20 MED ORDER — GABAPENTIN 300 MG PO CAPS: 300.0000 mg | ORAL_CAPSULE | Freq: Every day | ORAL | 2 refills | Status: DC

## 2023-01-29 ENCOUNTER — Telehealth: Payer: Self-pay | Admitting: Family Medicine

## 2023-01-29 ENCOUNTER — Telehealth: Payer: Self-pay | Admitting: Internal Medicine

## 2023-01-29 NOTE — Telephone Encounter (Signed)
Created in error

## 2023-01-29 NOTE — Telephone Encounter (Signed)
Pharmacy is calling requesting a refill for pt medication Lisinopril '40mg'$ . Per pharmacy last prescription was sent in by Dorna Mai on 01/30/2022 for one year refills.    Please advise [employer] [place], [place] - [address] [address] Phone: [phone]  Fax: [phone]

## 2023-02-10 ENCOUNTER — Encounter: Payer: Self-pay | Admitting: Physical Medicine & Rehabilitation

## 2023-02-10 ENCOUNTER — Encounter: Payer: 59 | Attending: Physical Medicine & Rehabilitation | Admitting: Physical Medicine & Rehabilitation

## 2023-02-10 VITALS — BP 162/118 | HR 80 | Ht 70.0 in | Wt 287.0 lb

## 2023-02-10 DIAGNOSIS — M48061 Spinal stenosis, lumbar region without neurogenic claudication: Secondary | ICD-10-CM | POA: Diagnosis present

## 2023-02-10 MED ORDER — GABAPENTIN 400 MG PO CAPS: 400.0000 mg | ORAL_CAPSULE | Freq: Every day | ORAL | 1 refills | Status: DC

## 2023-02-10 NOTE — Patient Instructions (Signed)
Back Exercises These exercises help to make your trunk and back strong. They also help to keep the lower back flexible. Doing these exercises can help to prevent or lessen pain in your lower back. If you have back pain, try to do these exercises 2-3 times each day or as told by your doctor. As you get better, do the exercises once each day. Repeat the exercises more often as told by your doctor. To stop back pain from coming back, do the exercises once each day, or as told by your doctor. Do exercises exactly as told by your doctor. Stop right away if you feel sudden pain or your pain gets worse. Exercises Single knee to chest Do these steps 3-5 times in a row for each leg: Lie on your back on a firm bed or the floor with your legs stretched out. Bring one knee to your chest. Grab your knee or thigh with both hands and hold it in place. Pull on your knee until you feel a gentle stretch in your lower back or butt. Keep doing the stretch for 10-30 seconds. Slowly let go of your leg and straighten it. Pelvic tilt Do these steps 5-10 times in a row: Lie on your back on a firm bed or the floor with your legs stretched out. Bend your knees so they point up to the ceiling. Your feet should be flat on the floor. Tighten your lower belly (abdomen) muscles to press your lower back against the floor. This will make your tailbone point up to the ceiling instead of pointing down to your feet or the floor. Stay in this position for 5-10 seconds while you gently tighten your muscles and breathe evenly. Cat-cow Do these steps until your lower back bends more easily: Get on your hands and knees on a firm bed or the floor. Keep your hands under your shoulders, and keep your knees under your hips. You may put padding under your knees. Let your head hang down toward your chest. Tighten (contract) the muscles in your belly. Point your tailbone toward the floor so your lower back becomes rounded like the back of a  cat. Stay in this position for 5 seconds. Slowly lift your head. Let the muscles of your belly relax. Point your tailbone up toward the ceiling so your back forms a sagging arch like the back of a cow. Stay in this position for 5 seconds.  Press-ups Do these steps 5-10 times in a row: Lie on your belly (face-down) on a firm bed or the floor. Place your hands near your head, about shoulder-width apart. While you keep your back relaxed and keep your hips on the floor, slowly straighten your arms to raise the top half of your body and lift your shoulders. Do not use your back muscles. You may change where you place your hands to make yourself more comfortable. Stay in this position for 5 seconds. Keep your back relaxed. Slowly return to lying flat on the floor.  Bridges Do these steps 10 times in a row: Lie on your back on a firm bed or the floor. Bend your knees so they point up to the ceiling. Your feet should be flat on the floor. Your arms should be flat at your sides, next to your body. Tighten your butt muscles and lift your butt off the floor until your waist is almost as high as your knees. If you do not feel the muscles working in your butt and the back of  your thighs, slide your feet 1-2 inches (2.5-5 cm) farther away from your butt. Stay in this position for 3-5 seconds. Slowly lower your butt to the floor, and let your butt muscles relax. If this exercise is too easy, try doing it with your arms crossed over your chest. Belly crunches Do these steps 5-10 times in a row: Lie on your back on a firm bed or the floor with your legs stretched out. Bend your knees so they point up to the ceiling. Your feet should be flat on the floor. Cross your arms over your chest. Tip your chin a little bit toward your chest, but do not bend your neck. Tighten your belly muscles and slowly raise your chest just enough to lift your shoulder blades a tiny bit off the floor. Avoid raising your body  higher than that because it can put too much stress on your lower back. Slowly lower your chest and your head to the floor. Back lifts Do these steps 5-10 times in a row: Lie on your belly (face-down) with your arms at your sides, and rest your forehead on the floor. Tighten the muscles in your legs and your butt. Slowly lift your chest off the floor while you keep your hips on the floor. Keep the back of your head in line with the curve in your back. Look at the floor while you do this. Stay in this position for 3-5 seconds. Slowly lower your chest and your face to the floor. Contact a doctor if: Your back pain gets a lot worse when you do an exercise. Your back pain does not get better within 2 hours after you exercise. If you have any of these problems, stop doing the exercises. Do not do them again unless your doctor says it is okay. Get help right away if: You have sudden, very bad back pain. If this happens, stop doing the exercises. Do not do them again unless your doctor says it is okay. This information is not intended to replace advice given to you by your health care provider. Make sure you discuss any questions you have with your health care provider. Document Revised: 02/27/2021 Document Reviewed: 02/27/2021 Elsevier Patient Education  [place].

## 2023-02-10 NOTE — Progress Notes (Signed)
Subjective:    Patient ID: Michael [name], male    DOB: 06-Apr-1971, 52 y.o.   MRN: [id number]  HPI  CC:  Low back back pain radiating to LLE ankle  52 year old male with history of chronic low back and lower extremity pain who was referred by orthopedic spine surgery Dr. Laurance Flatten for the evaluation of same.  Patient describes pain as sharp stabbing aching mainly in the lumbar area he does have some pain across the hips.  He complains of some hand pain but this is not his primary issue. He has a walking tolerance of 10 minutes he indicates that he uses a cane however he does not have one with him today.  His pain is exacerbated by walking bending and sitting he does not exercise on a regular basis he takes Tylenol daily but only 1 times per day has tried heat as well.  He has had physical therapy in the past which he states was not helpful.  Last PT last year   LEFT S1 Transforaminal 04/30/21, Dr. Ernestina Patches  LEFT L5-S1  Trans laminar 05/20/21 and 08/28/21, Dr. Ernestina Patches Which were helpful for the patient's pain.   CLINICAL DATA:  Lumbar radiculopathy. Symptoms for cyst with greatest 6 weeks treatment. Back spasms for years. No prior surgery. History of injection 1 month ago.   EXAM: MRI LUMBAR SPINE WITHOUT CONTRAST   TECHNIQUE: Multiplanar, multisequence MR imaging of the lumbar spine was performed. No intravenous contrast was administered.   COMPARISON:  Lumbar spine radiographs 01/10/2022; MRI lumbar spine 04/03/2021   FINDINGS: Segmentation:  Standard.   Alignment:  No sagittal spondylolisthesis.   Vertebrae: Vertebral body heights are maintained. Mild-to-moderate posterior L5-S1 disc space narrowing is minimally worsened from 04/03/2021. Small anterior inferior left L5 endplate cyst is unchanged. Interval decrease in now mild marrow edema throughout the majority of the L5 vertebral body with slight interval increase in moderate diffuse inferior greater than superior L5 vertebral  body chronic fat intensity marrow changes. No acute fracture or destructive bone lesion.   Conus medullaris and cauda equina: Conus extends to the L1-2 level. Conus and cauda equina appear normal.   Paraspinal and other soft tissues: The kidneys are partially visualized. There are multiple decreased T1 and increased T2 signal bilateral renal cysts that are similar to prior. Incidental note of normal variant retroaortic left renal vein.   Disc levels:   T12-L1: Unremarkable.   L1-2: Minimal extension of disc into the inferior aspect of the bilateral neural foramina. No central canal or neuroforaminal stenosis. No significant change.   L2-3: Mild bilateral facet joint hypertrophy. Mild broad-based posterior disc bulge and endplate spurring. No central canal stenosis. No neuroforaminal stenosis. No significant change.   L3-4: Mild bilateral facet joint hypertrophy. Mild broad-based posterior disc osteophyte complex with left greater than right intraforaminal extension. Borderline mild left neuroforaminal stenosis, unchanged. No central canal stenosis.   L4-5: Moderate bilateral facet joint hypertrophy. Small left-greater-than-right facet joint fusions, similar to prior. Mild-to-moderate broad-based posterior disc osteophyte complex with bilateral intraforaminal extension. Mild bilateral neuroforaminal stenosis, unchanged. Mild narrowing of the lateral recesses and borderline mild central canal stenosis along the inferior L4-5 level and superior L5 vertebral body, unchanged.   L5-S1: Moderate bilateral facet joint hypertrophy. Moderate broad-based posterior disc osteophyte complex with moderate left-greater-than-right intraforaminal extension midline posterior disc protrusion measures up to 6 mm in AP dimension. This appears unchanged on axial images but may be minimally increased from 5 mm to 6 mm on sagittal images.  Alternatively this change may be due to differences in slice  selection on the sagittal sequence. Moderate to severe left and moderate right lateral recess stenosis and mild-to-moderate left-greater-than-right central canal stenosis, unchanged. Moderate left and mild-to-moderate right neuroforaminal stenosis, unchanged.   IMPRESSION: Compared to 04/03/2021:   1. Multilevel degenerative disc and joint changes as above. 2. Mild L4-5 lateral recess and central canal stenosis and L5-S1 moderate to severe left and moderate right lateral recess stenosis mild-to-moderate left-greater-than-right central canal stenosis. 3. Multilevel neuroforaminal stenosis including mild bilateral L4-5 and moderate left and mild-to-moderate right L5-S1 neuroforaminal stenosis, unchanged.     Electronically Signed   By: Yvonne Kendall M.D.   On: 06/01/2022 15:57   Pain Inventory Average Pain 7 Pain Right Now 9 My pain is sharp, stabbing, and aching  In the last 24 hours, has pain interfered with the following? General activity 10 Relation with others 9 Enjoyment of life 10 What TIME of day is your pain at its worst? evening and night Sleep (in general) Poor  Pain is worse with: walking, bending, and sitting Pain improves with: heat/ice, medication, and injections Relief from Meds: 10  use a cane how many minutes can you walk? 10 ability to climb steps?  yes do you drive?  no  disabled: date disabled .  weakness numbness depression anxiety  Any changes since last visit?  no  Any changes since last visit?  no    Family History  Problem Relation Age of Onset   Cancer Father        prostate, colon- age was in his 52's   Heart disease Father 16       MI    Hypertension Father    Colon cancer Father    Diabetes Mother    Hypertension Mother    Hypertension Brother    Cancer Maternal Grandfather    Cancer Paternal Grandfather    Esophageal cancer Neg Hx    Stomach cancer Neg Hx    Rectal cancer Neg Hx    Social History   Socioeconomic  History   Marital status: Single    Spouse name: Not on file   Number of children: 4   Years of education: Not on file   Highest education level: Not on file  Occupational History   Occupation: Unemployed//disabled  Tobacco Use   Smoking status: Never   Smokeless tobacco: Never  Vaping Use   Vaping Use: Never used  Substance and Sexual Activity   Alcohol use: No    Alcohol/week: 0.0 standard drinks of alcohol   Drug use: Yes    Types: Marijuana   Sexual activity: Yes    Comment: per pt's blue health form - 1 sex partner in last 12 months  Other Topics Concern   Not on file  Social History Narrative   Marital status: single; living with fiance      Children: 4 children, no grandchildren.      Employment:  Disability for learning disabilities since 2012.   Right-handed   Caffeine: occasional         Social Determinants of Health   Financial Resource Strain: High Risk (09/08/2021)   Overall Financial Resource Strain (CARDIA)    Difficulty of Paying Living Expenses: Very hard  Food Insecurity: No Food Insecurity (09/08/2021)   Hunger Vital Sign    Worried About Running Out of Food in the Last Year: Never true    Ran Out of Food in the Last Year: Never true  Transportation Needs: No Transportation Needs (09/08/2021)   PRAPARE - Hydrologist (Medical): No    Lack of Transportation (Non-Medical): No  Physical Activity: Inactive (09/08/2021)   Exercise Vital Sign    Days of Exercise per Week: 0 days    Minutes of Exercise per Session: 0 min  Stress: Stress Concern Present (09/08/2021)   [place]    Feeling of Stress : To some extent  Social Connections: Socially Isolated (09/08/2021)   Social Connection and Isolation Panel [NHANES]    Frequency of Communication with Friends and Family: More than three times a week    Frequency of Social Gatherings with Friends and Family: Once a week     Attends Religious Services: Never    Marine scientist or Organizations: No    Attends Archivist Meetings: Never    Marital Status: Never married   Past Surgical History:  Procedure Laterality Date   Admission  04/2012   Malignant HTN, HA.  CT head negative, cardiology consult.     CYSTOSCOPY WITH RETROGRADE PYELOGRAM, URETEROSCOPY AND STENT PLACEMENT Left 01/24/2019   Procedure: CYSTOSCOPY WITH RETROGRADE PYELOGRAM, URETEROSCOPY AND STENT PLACEMENT;  Surgeon: Cleon Gustin, MD;  Location: [facility];  Service: Urology;  Laterality: Left;   HOLMIUM LASER APPLICATION Left [id number]   Procedure: HOLMIUM LASER APPLICATION;  Surgeon: Cleon Gustin, MD;  Location: [facility];  Service: Urology;  Laterality: Left;   MANDIBLE SURGERY  1998   metal plate   Past Medical History:  Diagnosis Date   Anxiety    Depression    Questionable per records. Not on any medication.    History of kidney stones    Hx of echocardiogram    a. Echo 12/13/12: Mild LVH, EF 99991111, grade 1 diastolic dysfunction, mild LAE, mild RVE, mild RAE   Hypertension    Insomnia    Left knee pain    Low back pain    Migraine    Noncompliance    OSA (obstructive sleep apnea)    a. Sleep Study 02/2013:  mod OSA, AHI 33 per hour, O2 sat nadir 85%   Ht 5' 10"$  (1.778 m)   Wt 287 lb (130.2 kg)   BMI 41.18 kg/m   Opioid Risk Score:   Fall Risk Score:  `1  Depression screen [facility] 2/9     09/08/2021   11:36 AM 09/08/2021   11:35 AM 07/05/2021    9:22 AM 03/20/2021    8:22 AM 02/27/2021   10:48 AM 01/03/2021   11:41 AM 11/26/2020    3:53 PM  Depression screen PHQ 2/9  Decreased Interest 0 0 0 0 0 0 0  Down, Depressed, Hopeless 1 1 0 0 3 0 0  PHQ - 2 Score 1 1 0 0 3 0 0  Altered sleeping 3  0  2    Tired, decreased energy 2  0  3    Change in appetite 1  0  3    Feeling bad or failure about yourself  1  0  2    Trouble concentrating 0  0  1    Moving slowly  or fidgety/restless 2  0  1    Suicidal thoughts 0  0  1    PHQ-9 Score 10  0  16    Difficult doing work/chores Very difficult  Somewhat difficult  Review of Systems  Musculoskeletal:  Positive for back pain and neck pain.       B/L hip and hand pain   All other systems reviewed and are negative.     Objective:   Physical Exam  General: Well-developed well-nourished morbidly obese male no acute distress Mood and affect appropriate Motor strength is 5/5 bilateral deltoid, bicep, tricep, grip, hip flexor, knee extensor, ankle dorsiflexion plantarflexion Sensation intact to light touch bilateral upper and lower limb Tone is normal bilateral upper and lower extremities Ambulates without assistive device no evidence of drag or knee instability Negative straight leg raise bilateral lower extremities Lumbar spine has mild tenderness palpation in the lumbar paraspinal area pain with extension greater than with flexion. Negative distraction test Negative FABERs Negative thigh thrust bilaterally No pain with bilateral hip internal extra rotation.     Assessment & Plan:   #1.  The patient has MRI evidence of central canal stenosis at L5-S1 due to disc osteophyte complex in combination with facet hypertrophy.  He has had good relief with L5-S1 translaminar injections but not with S1 transforaminal injections.  He has been giving back exercises went over the individual exercises today and told him to avoid the lumbar extension exercises. Will increase gabapentin to 400 mg nightly he states that he has minimal lower extremity pain during the day activity level is increased. Will set him up for L5-S1 trans laminar injections under fluoroscopic guidance.  Informed patient that he will need a driver.  Medications reviewed he is not on any anticoagulant medications he has no allergies to medication other than statins.

## 2023-02-26 ENCOUNTER — Ambulatory Visit: Payer: 59 | Admitting: Cardiology

## 2023-02-27 ENCOUNTER — Encounter: Payer: 59 | Admitting: Physical Medicine & Rehabilitation

## 2023-03-31 ENCOUNTER — Encounter: Payer: 59 | Admitting: Physical Medicine & Rehabilitation

## 2023-03-31 VITALS — BP 168/114 | HR 68 | Ht 70.0 in | Wt 298.0 lb

## 2023-03-31 DIAGNOSIS — M48061 Spinal stenosis, lumbar region without neurogenic claudication: Secondary | ICD-10-CM

## 2023-03-31 NOTE — Progress Notes (Unsigned)
  PROCEDURE RECORD [facility]   Name: Michael [name] DOB:1971-02-08 MRN: [id number]  Date:03/31/2023  Physician: Alysia Penna, MD    Nurse/CMA: ***  Allergies:  Allergies  Allergen Reactions   Statins Other (See Comments)    unknown    Consent Signed: {yes [id number]}  Is patient diabetic? No.  CBG today? .  Pregnant: No. LMP: No LMP for male patient. (age 54-55)  Anticoagulants: no Anti-inflammatory: no Antibiotics: no  Procedure: ***  Position: Prone Start Time: ***  End Time: ***  Fluoro Time: ***  RN/CMA Lee, CMA     Time 2:49 pm     BP *** ***    Pulse *** ***    Respirations *** ***    O2 Sat *** ***    S/S *** ***    Pain Level *** ***     D/C home with no one, patient A & O X 3, D/C instructions reviewed, and sits independently.

## 2023-04-06 ENCOUNTER — Emergency Department (HOSPITAL_COMMUNITY): Payer: 59

## 2023-04-06 ENCOUNTER — Emergency Department (HOSPITAL_COMMUNITY)
Admission: EM | Admit: 2023-04-06 | Discharge: 2023-04-06 | Disposition: A | Discharge status: Home / Self Care | Payer: 59 | Attending: Emergency Medicine | Admitting: Emergency Medicine

## 2023-04-06 ENCOUNTER — Encounter (HOSPITAL_COMMUNITY): Payer: Self-pay

## 2023-04-06 DIAGNOSIS — R040 Epistaxis: Secondary | ICD-10-CM | POA: Insufficient documentation

## 2023-04-06 DIAGNOSIS — Z79899 Other long term (current) drug therapy: Secondary | ICD-10-CM | POA: Diagnosis not present

## 2023-04-06 DIAGNOSIS — R030 Elevated blood-pressure reading, without diagnosis of hypertension: Secondary | ICD-10-CM

## 2023-04-06 DIAGNOSIS — I110 Hypertensive heart disease with heart failure: Secondary | ICD-10-CM | POA: Insufficient documentation

## 2023-04-06 DIAGNOSIS — R5383 Other fatigue: Secondary | ICD-10-CM | POA: Insufficient documentation

## 2023-04-06 DIAGNOSIS — R519 Headache, unspecified: Secondary | ICD-10-CM | POA: Insufficient documentation

## 2023-04-06 DIAGNOSIS — I509 Heart failure, unspecified: Secondary | ICD-10-CM | POA: Diagnosis not present

## 2023-04-06 LAB — CBC
HCT: 39.8 % (ref 39.0–52.0)
Hemoglobin: 13.2 g/dL (ref 13.0–17.0)
MCH: 29 pg (ref 26.0–34.0)
MCHC: 33.2 g/dL (ref 30.0–36.0)
MCV: 87.5 fL (ref 80.0–100.0)
Platelets: 174 10*3/uL (ref 150–400)
RBC: 4.55 MIL/uL (ref 4.22–5.81)
RDW: 14.8 % (ref 11.5–15.5)
WBC: 4.7 10*3/uL (ref 4.0–10.5)
nRBC: 0 % (ref 0.0–0.2)

## 2023-04-06 LAB — BASIC METABOLIC PANEL
Anion gap: 10 (ref 5–15)
BUN: 22 mg/dL — ABNORMAL HIGH (ref 6–20)
CO2: 25 mmol/L (ref 22–32)
Calcium: 8.9 mg/dL (ref 8.9–10.3)
Chloride: 106 mmol/L (ref 98–111)
Creatinine, Ser: 0.98 mg/dL (ref 0.61–1.24)
GFR, Estimated: >60 mL/min (ref >60)
Glucose, Bld: 113 mg/dL — ABNORMAL HIGH (ref 70–99)
Potassium: 3.2 mmol/L — ABNORMAL LOW (ref 3.5–5.1)
Sodium: 141 mmol/L (ref 135–145)

## 2023-04-06 MED ORDER — ONDANSETRON 4 MG PO TBDP
4.0000 mg | ORAL_TABLET | Freq: Once | ORAL | Status: AC
  Administered 2023-04-06: 4 mg via ORAL
  Filled 2023-04-06: qty 1

## 2023-04-06 NOTE — ED Triage Notes (Addendum)
Pt bib ems coming from home. Reports nosebleed starting around 0600; no active bleeding on arrival. Intermittent headaches starting on Friday. Denies SOB, chest pain, dizziness.    EMS vitals : BP 153/115 Cbg 120 HR 84 99% room air

## 2023-04-06 NOTE — Discharge Instructions (Signed)
You are seen in the ER for intermittent headache and nosebleed.  As we discussed your workup and imaging was reassuring today.  We did not find an emergent cause for your nosebleed.  It is rare that elevated blood pressure causes a bleed, but can make it more difficult to stop.  You have had reassuring blood pressure readings while in the ER.  Please contact your doctor first thing tomorrow morning and let them know about your visit today.  They may want to move up your scheduled appointment, or they will just see you on the 11th.  Continue to monitor how you're doing and return to the ER for new or worsening symptoms.

## 2023-04-06 NOTE — ED Provider Notes (Signed)
[place] EMERGENCY DEPARTMENT AT [facility] Provider Note   CSN: [id number] Arrival date & time: 04/06/23  6789     History  Chief Complaint  Patient presents with   Epistaxis    Michael [name] is a 52 y.o. male with history of HTN, migraines, OSA, CHF (EF 60-65% on echo Dec 2023) who presents to ER complaining of epistaxis.  Patient states that he has been having intermittent headaches for the past few days.  Upon waking this morning, he had a nosebleed starting at about 6 AM.  He had a headache at that time as well.  Bleeding resolved after about 2-2 and half hours.  No active bleeding on ER arrival.  States that his blood pressure reading when EMS got to his house was 240 systolics.  Patient denies any other complaints at this time, fatigue.   Epistaxis Associated symptoms: headaches        Home Medications Prior to Admission medications   Medication Sig Start Date End Date Taking? Authorizing Provider  allopurinol (ZYLOPRIM) 100 MG tablet Take 1 tablet (100 mg total) by mouth daily. 06/18/22   Kerrin Champagne, MD  amLODipine (NORVASC) 10 MG tablet Take 1 tablet (10 mg total) by mouth daily. 08/27/21   Rema Fendt, NP  atorvastatin (LIPITOR) 20 MG tablet Take 1 tablet (20 mg total) by mouth daily. 12/25/22 12/20/23  Little Ishikawa, MD  buPROPion (WELLBUTRIN XL) 300 MG 24 hr tablet Take by mouth. 02/17/19   [provider]  carvedilol (COREG) 6.25 MG tablet Take 1 tablet by mouth twice daily 12/31/21   Rema Fendt, NP  cetirizine (ZYRTEC) 10 MG tablet Take 1 tablet (10 mg total) by mouth daily. 12/05/21 01/04/22  Ivonne Andrew, NP  chlorhexidine (PERIDEX) 0.12 % solution  02/02/19   [provider]  DULoxetine (CYMBALTA) 60 MG capsule Take 60 mg by mouth 2 (two) times daily. 11/07/22   [provider]  gabapentin (NEURONTIN) 400 MG capsule Take 1 capsule (400 mg total) by mouth at bedtime. 02/10/23   Kirsteins, Victorino Sparrow, MD  lisinopril  (ZESTRIL) 40 MG tablet Take 40 mg by mouth daily. 12/15/22   [provider]  lisinopril (ZESTRIL) 40 MG tablet Take by mouth. 02/01/18   [provider]  losartan (COZAAR) 50 MG tablet Take 50 mg by mouth daily. 11/08/22   [provider]  metoprolol tartrate (LOPRESSOR) 100 MG tablet Take 1 tablet (100mg ) TWO hours prior to CT scan 11/27/22   Little Ishikawa, MD  potassium chloride (KLOR-CON) 10 MEQ tablet  06/30/19   [provider]  tamsulosin (FLOMAX) 0.4 MG CAPS capsule SMARTSIG:1 Capsule(s) By Mouth Every Evening 01/16/23   [provider]      Allergies    Statins    Review of Systems   Review of Systems  Constitutional:  Positive for fatigue.  HENT:  Positive for nosebleeds.   Neurological:  Positive for headaches.  All other systems reviewed and are negative.   Physical Exam Updated Vital Signs BP (!) 148/114   Pulse 81   Temp 98.5 F (36.9 C) (Oral)   Resp 16   SpO2 97%  Physical Exam Vitals and nursing note reviewed.  Constitutional:      Appearance: Normal appearance.  HENT:     Head: Normocephalic and atraumatic.     Nose:     Comments: Dried blood in bilateral nares, cannot visualize origin of bleeding Eyes:  Conjunctiva/sclera: Conjunctivae normal.  Cardiovascular:     Rate and Rhythm: Normal rate and regular rhythm.  Pulmonary:     Effort: Pulmonary effort is normal. No respiratory distress.     Breath sounds: Normal breath sounds.  Abdominal:     General: There is no distension.     Palpations: Abdomen is soft.     Tenderness: There is no abdominal tenderness.  Skin:    General: Skin is warm and dry.  Neurological:     General: No focal deficit present.     Mental Status: He is alert.     Comments: Neuro: Speech is clear, able to follow commands. CN III-XII intact grossly intact. PERRLA. EOMI. Sensation intact throughout. Str 5/5 all extremities.     ED Results / Procedures / Treatments    Labs (all labs ordered are listed, but only abnormal results are displayed) Labs Reviewed  BASIC METABOLIC PANEL - Abnormal; Notable for the following components:      Result Value   Potassium 3.2 (*)    Glucose, Bld 113 (*)    BUN 22 (*)    All other components within normal limits  CBC    EKG None  Radiology CT Head Wo Contrast  Result Date: 04/06/2023 CLINICAL DATA:  Headache with nosebleed hypertension EXAM: CT HEAD WITHOUT CONTRAST CT MAXILLOFACIAL WITHOUT CONTRAST TECHNIQUE: Multidetector CT imaging of the head and maxillofacial structures were performed using the standard protocol without intravenous contrast. Multiplanar CT image reconstructions of the maxillofacial structures were also generated. RADIATION DOSE REDUCTION: This exam was performed according to the departmental dose-optimization program which includes automated exposure control, adjustment of the mA and/or kV according to patient size and/or use of iterative reconstruction technique. COMPARISON:  CTA Head 11/16/20, CT Head 11/16/20 FINDINGS: CT HEAD FINDINGS Brain: No evidence of acute infarction, hemorrhage, hydrocephalus, extra-axial collection or mass lesion/mass effect. Sequela of mild chronic microvascular ischemic change. Vascular: No hyperdense vessel or unexpected calcification. Skull: Normal. Negative for fracture or focal lesion. Other: None. CT MAXILLOFACIAL FINDINGS Osseous: No fracture or mandibular dislocation. No destructive process. Is prior mandibular screw and plate fixation of the mandibular symphysis. Orbits: Negative. No traumatic or inflammatory finding. Sinuses: Middle ear or mastoid effusion. Polypoid mucosal thickening left maxillary sinus. Orbits are unremarkable. Soft tissues: Negative. IMPRESSION: 1. No acute intracranial abnormality. 2. No acute facial bone fracture. Electronically Signed   By: Lorenza CambridgeHemant  Desai M.D.   On: 04/06/2023 13:19   CT Maxillofacial Wo Contrast  Result Date:  04/06/2023 CLINICAL DATA:  Headache with nosebleed hypertension EXAM: CT HEAD WITHOUT CONTRAST CT MAXILLOFACIAL WITHOUT CONTRAST TECHNIQUE: Multidetector CT imaging of the head and maxillofacial structures were performed using the standard protocol without intravenous contrast. Multiplanar CT image reconstructions of the maxillofacial structures were also generated. RADIATION DOSE REDUCTION: This exam was performed according to the departmental dose-optimization program which includes automated exposure control, adjustment of the mA and/or kV according to patient size and/or use of iterative reconstruction technique. COMPARISON:  CTA Head 11/16/20, CT Head 11/16/20 FINDINGS: CT HEAD FINDINGS Brain: No evidence of acute infarction, hemorrhage, hydrocephalus, extra-axial collection or mass lesion/mass effect. Sequela of mild chronic microvascular ischemic change. Vascular: No hyperdense vessel or unexpected calcification. Skull: Normal. Negative for fracture or focal lesion. Other: None. CT MAXILLOFACIAL FINDINGS Osseous: No fracture or mandibular dislocation. No destructive process. Is prior mandibular screw and plate fixation of the mandibular symphysis. Orbits: Negative. No traumatic or inflammatory finding. Sinuses: Middle ear or mastoid effusion. Polypoid mucosal thickening  left maxillary sinus. Orbits are unremarkable. Soft tissues: Negative. IMPRESSION: 1. No acute intracranial abnormality. 2. No acute facial bone fracture. Electronically Signed   By: Lorenza Cambridge M.D.   On: 04/06/2023 13:19    Procedures Procedures    Medications Ordered in ED Medications  ondansetron (ZOFRAN-ODT) disintegrating tablet 4 mg (has no administration in time range)    ED Course/ Medical Decision Making/ A&P                             Medical Decision Making Amount and/or Complexity of Data Reviewed Labs: ordered. Radiology: ordered.  This patient is a 52 y.o. male  who presents to the ED for concern of nose  bleed.   Differential diagnoses prior to evaluation: The emergent differential diagnosis includes, but is not limited to,  coagulation disorder, hypertensive emergency/urgency, digital trauma, anticoaug use, trauma, HTN, neoplasia. This is not an exhaustive differential.   Past Medical History / Co-morbidities: HTN, migraines, OSA, CHF (EF 60-65% on echo Dec 2023)  Additional history: Chart reviewed. Pertinent results include: on several medications for HTN including amlodipine, carvedilol, losartan, metoprolol.  Physical Exam: Physical exam performed. The pertinent findings include: Mildly hypertensive to 153/116, otherwise normal vital signs.  Normal neurologic exam as above.  Heart regular rate and rhythm.  Lab Tests/Imaging studies: I personally interpreted labs/imaging and the pertinent results include: Potassium 3.2, Leukos 113, otherwise BMP and CBC unremarkable..  CT head and maxillofacial without acute abnormalities.  I agree with the radiologist interpretation.  Medications: I ordered medication including zofran for nausea. Patient states he got nauseous during the CT scan because he has not eaten anything today. I have reviewed the patients home medicines and have made adjustments as needed.   Disposition: After consideration of the diagnostic results and the patients response to treatment, I feel that emergency department workup does not suggest an emergent condition requiring admission or immediate intervention beyond what has been performed at this time. The plan is: discharge to home with close PCP follow up regarding elevated BP readings with EMS and subsequent nosebleed. Unlikely HTN caused the episode of epistaxis itself, and no other emergent etiology found as cause. The patient is safe for discharge and has been instructed to return immediately for worsening symptoms, change in symptoms or any other concerns.  I discussed this case with my attending physician Dr. Eloise Harman  who cosigned this note including patient's presenting symptoms, physical exam, and planned diagnostics and interventions. Attending physician stated agreement with plan or made changes to plan which were implemented.   Final Clinical Impression(s) / ED Diagnoses Final diagnoses:  Epistaxis  Elevated blood pressure reading    Rx / DC Orders ED Discharge Orders     None      Portions of this report may have been transcribed using voice recognition software. Every effort was made to ensure accuracy; however, inadvertent computerized transcription errors may be present.    Jeanella Flattery 04/06/23 1357    Rondel Baton, MD 04/07/23 [phone]

## 2023-05-12 ENCOUNTER — Encounter: Payer: 59 | Admitting: Physical Medicine & Rehabilitation

## 2023-05-19 ENCOUNTER — Ambulatory Visit: Payer: 59 | Attending: Cardiology | Admitting: Cardiology

## 2023-05-19 ENCOUNTER — Encounter: Payer: Self-pay | Admitting: Cardiology

## 2023-05-19 VITALS — BP 132/102 | HR 75 | Ht 70.0 in | Wt 289.8 lb

## 2023-05-19 DIAGNOSIS — I10 Essential (primary) hypertension: Secondary | ICD-10-CM

## 2023-05-19 DIAGNOSIS — R0609 Other forms of dyspnea: Secondary | ICD-10-CM

## 2023-05-19 DIAGNOSIS — I77819 Aortic ectasia, unspecified site: Secondary | ICD-10-CM | POA: Diagnosis not present

## 2023-05-19 DIAGNOSIS — I517 Cardiomegaly: Secondary | ICD-10-CM | POA: Diagnosis not present

## 2023-05-19 NOTE — Progress Notes (Signed)
Cardiology Office Note:    Date:  05/19/2023   ID:  Alecia Lemming Bangert, DOB 08/12/1971, MRN [id number]  PCP:  Cleatis Polka., MD  Cardiologist:  Little Ishikawa, MD  Electrophysiologist:  None   Referring MD: Rema Fendt, NP   No chief complaint on file.   History of Present Illness:    Michael [name] is a 52 y.o. male with a hx of hypertension, OSA who presents for follow-up.  He was referred by Ricky Stabs, NP for preop evaluation prior to back surgery, initially seen 11/27/2022.  Previously seen by Dr. Mayford Knife, last seen 05/08/2017.  He reports he has been having shortness of breath, particularly notices it when walking up stairs.  Does not exercise much, limited by back pain.  Denies any chest pain, lightheadedness, syncope, lower extremity edema, or palpitations.  Smoked occasionally as a teenager/early 20s but none since.  Family history includes father had MI in 66s.  Echocardiogram 05/21/2017 showed moderate LVH, EF 60 to 65%, normal RV function, no significant valvular disease.  Lexiscan Myoview 05/22/2017 showed normal perfusion, EF 54%.  Coronary CTA 12/16/2022 showed minimal CAD (1 to 24% stenosis in proximal RCA), calcium score 0, dilated ascending aorta measuring 44 mm.  Echocardiogram 12/25/2022 shows normal biventricular function, no significant valvular disease, dilated ascending aorta measuring 44 mm.  Since last clinic visit, he reports he has been doing okay.  Continues to have dyspnea and some chest pain.  Main issue has been back pain.  Reports some lightheadedness with standing, denies any syncope.  Denies any lower extremity edema.  Reports some palpitations, especially when walking up stairs.  He has been having trouble tolerating CPAP, has not been using.   BP Readings from Last 3 Encounters:  05/19/23 (!) 132/102  04/06/23 (!) 148/114  03/31/23 (!) 168/114     Past Medical History:  Diagnosis Date   Anxiety    Depression    Questionable per records.  Not on any medication.    History of kidney stones    Hx of echocardiogram    a. Echo 12/13/12: Mild LVH, EF 50-55%, grade 1 diastolic dysfunction, mild LAE, mild RVE, mild RAE   Hypertension    Insomnia    Left knee pain    Low back pain    Migraine    Noncompliance    OSA (obstructive sleep apnea)    a. Sleep Study 02/2013:  mod OSA, AHI 33 per hour, O2 sat nadir 85%    Past Surgical History:  Procedure Laterality Date   Admission  04/2012   Malignant HTN, HA.  CT head negative, cardiology consult.     CYSTOSCOPY WITH RETROGRADE PYELOGRAM, URETEROSCOPY AND STENT PLACEMENT Left 01/24/2019   Procedure: CYSTOSCOPY WITH RETROGRADE PYELOGRAM, URETEROSCOPY AND STENT PLACEMENT;  Surgeon: Malen Gauze, MD;  Location: [facility];  Service: Urology;  Laterality: Left;   HOLMIUM LASER APPLICATION Left 01/24/2019   Procedure: HOLMIUM LASER APPLICATION;  Surgeon: Malen Gauze, MD;  Location: [facility];  Service: Urology;  Laterality: Left;   MANDIBLE SURGERY  1998   metal plate    Current Medications: Current Meds  Medication Sig   allopurinol (ZYLOPRIM) 100 MG tablet Take 1 tablet (100 mg total) by mouth daily.   amLODipine (NORVASC) 10 MG tablet Take 1 tablet (10 mg total) by mouth daily.   atorvastatin (LIPITOR) 20 MG tablet Take 1 tablet (20 mg total) by mouth daily.   buPROPion (WELLBUTRIN XL) 300  MG 24 hr tablet Take by mouth.   carvedilol (COREG) 6.25 MG tablet Take 1 tablet by mouth twice daily   cetirizine (ZYRTEC) 10 MG tablet Take 1 tablet (10 mg total) by mouth daily.   chlorhexidine (PERIDEX) 0.12 % solution    chlorthalidone (HYGROTON) 25 MG tablet Take 25 mg by mouth daily.   DULoxetine (CYMBALTA) 60 MG capsule Take 60 mg by mouth 2 (two) times daily.   gabapentin (NEURONTIN) 400 MG capsule Take 1 capsule (400 mg total) by mouth at bedtime.   lisinopril (ZESTRIL) 40 MG tablet Take 40 mg by mouth daily.   lisinopril (ZESTRIL) 40 MG  tablet Take by mouth.   losartan (COZAAR) 50 MG tablet Take 50 mg by mouth daily.   potassium chloride (KLOR-CON) 10 MEQ tablet    tamsulosin (FLOMAX) 0.4 MG CAPS capsule SMARTSIG:1 Capsule(s) By Mouth Every Evening   [DISCONTINUED] metoprolol tartrate (LOPRESSOR) 100 MG tablet Take 1 tablet (100mg ) TWO hours prior to CT scan   Current Facility-Administered Medications for the 05/19/23 encounter (Office Visit) with Little Ishikawa, MD  Medication   0.9 %  sodium chloride infusion     Allergies:   Statins   Social History   Socioeconomic History   Marital status: Single    Spouse name: Not on file   Number of children: 4   Years of education: Not on file   Highest education level: Not on file  Occupational History   Occupation: Unemployed//disabled  Tobacco Use   Smoking status: Never   Smokeless tobacco: Never  Vaping Use   Vaping Use: Never used  Substance and Sexual Activity   Alcohol use: No    Alcohol/week: 0.0 standard drinks of alcohol   Drug use: Yes    Types: Marijuana   Sexual activity: Yes    Comment: per pt's blue health form - 1 sex partner in last 12 months  Other Topics Concern   Not on file  Social History Narrative   Marital status: single; living with fiance      Children: 4 children, no grandchildren.      Employment:  Disability for learning disabilities since 2012.   Right-handed   Caffeine: occasional         Social Determinants of Health   Financial Resource Strain: High Risk (09/08/2021)   Overall Financial Resource Strain (CARDIA)    Difficulty of Paying Living Expenses: Very hard  Food Insecurity: No Food Insecurity (09/08/2021)   Hunger Vital Sign    Worried About Running Out of Food in the Last Year: Never true    Ran Out of Food in the Last Year: Never true  Transportation Needs: No Transportation Needs (09/08/2021)   PRAPARE - Administrator, Civil Service (Medical): No    Lack of Transportation (Non-Medical): No   Physical Activity: Inactive (09/08/2021)   Exercise Vital Sign    Days of Exercise per Week: 0 days    Minutes of Exercise per Session: 0 min  Stress: Stress Concern Present (09/08/2021)   [employer] of Occupational Health - Occupational Stress Questionnaire    Feeling of Stress : To some extent  Social Connections: Socially Isolated (09/08/2021)   Social Connection and Isolation Panel [NHANES]    Frequency of Communication with Friends and Family: More than three times a week    Frequency of Social Gatherings with Friends and Family: Once a week    Attends Religious Services: Never    Database administrator or  Organizations: No    Attends Engineer, structural: Never    Marital Status: Never married     Family History: The patient's family history includes Cancer in his father, maternal grandfather, and paternal grandfather; Colon cancer in his father; Diabetes in his mother; Heart disease (age of onset: 32) in his father; Hypertension in his brother, father, and mother. There is no history of Esophageal cancer, Stomach cancer, or Rectal cancer.  ROS:   Please see the history of present illness.     All other systems reviewed and are negative.  EKGs/Labs/Other Studies Reviewed:    The following studies were reviewed today:   EKG:   11/27/2022: Normal sinus rhythm, rate 80, T wave inversions in leads V3-5 05/19/2023: Normal sinus rhythm, rate 75, PACs, nonspecific T wave flattening  Recent Labs: 04/06/2023: BUN 22; Creatinine, Ser 0.98; Hemoglobin 13.2; Platelets 174; Potassium 3.2; Sodium 141  Recent Lipid Panel    Component Value Date/Time   CHOL 151 11/27/2022 1416   TRIG 109 11/27/2022 1416   HDL 44 11/27/2022 1416   CHOLHDL 3.4 11/27/2022 1416   CHOLHDL 3.2 06/13/2015 1118   VLDL 8 06/13/2015 1118   LDLCALC 87 11/27/2022 1416    Physical Exam:    VS:  BP (!) 132/102 (BP Location: Left Arm, Patient Position: Sitting, Cuff Size: Large)   Pulse 75   Ht  5\' 10"  (1.778 m)   Wt 289 lb 12.8 oz (131.5 kg)   SpO2 97%   BMI 41.58 kg/m     Wt Readings from Last 3 Encounters:  05/19/23 289 lb 12.8 oz (131.5 kg)  03/31/23 298 lb (135.2 kg)  02/10/23 287 lb (130.2 kg)     GEN:  Well nourished, well developed in no acute distress HEENT: Normal NECK: No JVD; No carotid bruits LYMPHATICS: No lymphadenopathy CARDIAC: RRR, no murmurs, rubs, gallops RESPIRATORY:  Clear to auscultation without rales, wheezing or rhonchi  ABDOMEN: Soft, non-tender, non-distended MUSCULOSKELETAL:  No edema; No deformity  SKIN: Warm and dry NEUROLOGIC:  Alert and oriented x 3 PSYCHIATRIC:  Normal affect   ASSESSMENT:    1. DOE (dyspnea on exertion)   2. LVH (left ventricular hypertrophy)   3. Aortic dilatation (HCC)   4. Essential hypertension    PLAN:    DOE: He is reporting dyspnea on exertion.  Coronary CTA 12/16/2022 showed minimal CAD (1 to 24% stenosis in proximal RCA), calcium score 0, dilated ascending aorta measuring 44 mm.  Echocardiogram 12/25/2022 shows normal biventricular function, no significant valvular disease, dilated ascending aorta measuring 44 mm.  Aortic dilatation: Ascending aorta measured 44 mm on coronary CTA 12/16/2022.  Measured 44 mm on echocardiogram 12/25/2022 -Plan echocardiogram in 1 year to monitor  LVH: Moderate LVH noted on echo 04/2017.  Likely due to hypertension.  Echo 11/2022 showed mild LVH  Hypertension: On amlodipine 10 mg daily, carvedilol 6.25 mg twice daily, and losartan 50 mg daily -He also has lisinopril 40 mg daily on his med list, he states that he is not taking both losartan and lisinopril but he is not sure exactly what he is taking.  Asked him to call and let us know his medications when he gets home -States he is only taking carvedilol 6.25 mg once daily.  Counseled on importance of taking twice daily -Recommend checking BP daily for next 2 weeks and let us know results  Morbid obesity: Body mass index is  41.58 kg/m.  Diet/exercise encouraged  OSA: on CPAP, reports has  not been using.  Encouraged compliance.  He reports he is having issues with his CPAP mask, encouraged him to follow-up with sleep medicine  RTC in 6 months    Medication Adjustments/Labs and Tests Ordered: Current medicines are reviewed at length with the patient today.  Concerns regarding medicines are outlined above.  Orders Placed This Encounter  Procedures   Basic metabolic panel   EKG 12-Lead   ECHOCARDIOGRAM COMPLETE   No orders of the defined types were placed in this encounter.   Patient Instructions  Medication Instructions:  Call or send a message via Mychart with list of current medications  *If you need a refill on your cardiac medications before your next appointment, please call your pharmacy*  Labs: BMET today  Testing/Procedures: Your physician has requested that you have an echocardiogram in 12 MONTHS. Echocardiography is a painless test that uses sound waves to create images of your heart. It provides your doctor with information about the size and shape of your heart and how well your heart's chambers and valves are working. This procedure takes approximately one hour. There are no restrictions for this procedure. Please do NOT wear cologne, perfume, aftershave, or lotions (deodorant is allowed). Please arrive 15 minutes prior to your appointment time.   Follow-Up: At [facility], you and your health needs are our priority.  As part of our continuing mission to provide you with exceptional heart care, we have created designated Provider Care Teams.  These Care Teams include your primary Cardiologist (physician) and Advanced Practice Providers (APPs -  Physician Assistants and Nurse Practitioners) who all work together to provide you with the care you need, when you need it.  We recommend signing up for the patient portal called "MyChart".  Sign up information is provided on this After  Visit Summary.  MyChart is used to connect with patients for Virtual Visits (Telemedicine).  Patients are able to view lab/test results, encounter notes, upcoming appointments, etc.  Non-urgent messages can be sent to your provider as well.   To learn more about what you can do with MyChart, go to [email or web address].    Your next appointment:   6 month(s)  Provider:   Dr. Bjorn Pippin  Please check your blood pressure at home daily, write it down.  Call the office or send message via Mychart with the readings in 2 weeks for Dr. Bjorn Pippin to review.     Contact your sleep doctor to discuss restarting CPAP   Signed, Little Ishikawa, MD  05/19/2023 3:49 PM    [facility]

## 2023-05-19 NOTE — Patient Instructions (Signed)
Medication Instructions:  Call or send a message via Mychart with list of current medications  *If you need a refill on your cardiac medications before your next appointment, please call your pharmacy*  Labs: BMET today  Testing/Procedures: Your physician has requested that you have an echocardiogram in 12 MONTHS. Echocardiography is a painless test that uses sound waves to create images of your heart. It provides your doctor with information about the size and shape of your heart and how well your heart's chambers and valves are working. This procedure takes approximately one hour. There are no restrictions for this procedure. Please do NOT wear cologne, perfume, aftershave, or lotions (deodorant is allowed). Please arrive 15 minutes prior to your appointment time.   Follow-Up: At [facility], you and your health needs are our priority.  As part of our continuing mission to provide you with exceptional heart care, we have created designated Provider Care Teams.  These Care Teams include your primary Cardiologist (physician) and Advanced Practice Providers (APPs -  Physician Assistants and Nurse Practitioners) who all work together to provide you with the care you need, when you need it.  We recommend signing up for the patient portal called "MyChart".  Sign up information is provided on this After Visit Summary.  MyChart is used to connect with patients for Virtual Visits (Telemedicine).  Patients are able to view lab/test results, encounter notes, upcoming appointments, etc.  Non-urgent messages can be sent to your provider as well.   To learn more about what you can do with MyChart, go to [email or web address].    Your next appointment:   6 month(s)  Provider:   Dr. Bjorn Pippin  Please check your blood pressure at home daily, write it down.  Call the office or send message via Mychart with the readings in 2 weeks for Dr. Bjorn Pippin to review.     Contact your sleep doctor to  discuss restarting CPAP

## 2023-05-20 ENCOUNTER — Telehealth: Payer: Self-pay | Admitting: Cardiology

## 2023-05-20 LAB — BASIC METABOLIC PANEL
BUN/Creatinine Ratio: 14 (ref 9–20)
BUN: 14 mg/dL (ref 6–24)
CO2: 24 mmol/L (ref 20–29)
Calcium: 9.1 mg/dL (ref 8.7–10.2)
Chloride: 103 mmol/L (ref 96–106)
Creatinine, Ser: 1.01 mg/dL (ref 0.76–1.27)
Glucose: 100 mg/dL — ABNORMAL HIGH (ref 70–99)
Potassium: 3.6 mmol/L (ref 3.5–5.2)
Sodium: 141 mmol/L (ref 134–144)
eGFR: 89 mL/min/{1.73_m2} (ref >59)

## 2023-05-20 NOTE — Telephone Encounter (Signed)
Patient states he is taking amlodipine 10mg , carvedilol 6.25mg , duloxetine 60mg , atorvastatin 20mg , gabapentin 400mg , losartan 40mg , allopurinal 100mg , tamsulosin 0.4mg 

## 2023-05-20 NOTE — Telephone Encounter (Signed)
Pt returning nurses call regarding results. Please advise 

## 2023-05-21 NOTE — Telephone Encounter (Signed)
Left message to call back  

## 2023-05-21 NOTE — Telephone Encounter (Signed)
Can we clarify his losartan dose, does he mean 50 mg? He is listed as taking both losartan 50 mg daily and lisinopril 40 mg daily.  Want to ensure he is only taking 1 of these.  Can we update med list?

## 2023-05-26 ENCOUNTER — Encounter: Payer: 59 | Attending: Physical Medicine & Rehabilitation | Admitting: Physical Medicine & Rehabilitation

## 2023-05-26 ENCOUNTER — Encounter: Payer: Self-pay | Admitting: Physical Medicine & Rehabilitation

## 2023-05-26 VITALS — BP 154/115 | HR 70 | Temp 98.0°F | Ht 70.0 in | Wt 288.0 lb

## 2023-05-26 DIAGNOSIS — M48061 Spinal stenosis, lumbar region without neurogenic claudication: Secondary | ICD-10-CM | POA: Diagnosis present

## 2023-05-26 MED ORDER — IOHEXOL 180 MG/ML  SOLN
3.0000 mL | Freq: Once | INTRAMUSCULAR | Status: AC
  Administered 2023-05-26: 3 mL via INTRAVENOUS

## 2023-05-26 MED ORDER — BETAMETHASONE SOD PHOS & ACET 6 (3-3) MG/ML IJ SUSP
12.0000 mg | Freq: Once | INTRAMUSCULAR | Status: AC
Start: 2023-05-26 — End: 2023-05-26
  Administered 2023-05-26: 12 mg via INTRAMUSCULAR

## 2023-05-26 NOTE — Progress Notes (Signed)
  PROCEDURE RECORD [facility]   Name: Shadarius Mcfarren DOB:05-18-1971 MRN: [id number]  Date:05/26/2023  Physician: Claudette Laws, MD    Nurse/CMA: Laira Penninger RMA   Allergies:  Allergies  Allergen Reactions   Statins Other (See Comments)    unknown    Consent Signed: Yes.    Is patient diabetic? No.  CBG today? .  Pregnant: Yes.   LMP: No LMP for male patient. (age 54-55)  Anticoagulants: no Anti-inflammatory: no Antibiotics: no  Procedure: Left L5-S1 Translaminar Epidural (kit)  Position: Prone Start Time: 11:47 AM  End Time: 11:53  Fluoro Time: 30  RN/CMA Sheenah Dimitroff RMA  Mosiah Bastin RMA    Time 11AM 11:57 AM    BP 136/96 160/109    Pulse 70 61    Respirations 16 16    O2 Sat 96 96    S/S 6 6    Pain Level 8/10 0/10     D/C home with transportation, patient A & O X 3, D/C instructions reviewed, and sits independently.

## 2023-05-26 NOTE — Progress Notes (Signed)
Left L5-S1Lumbar epidural steroid injection under fluoroscopic guidance  Indication: Lumbosacral radiculitis is not relieved by medication management or other conservative care and interfering with self-care and mobility.  No  anticoagulant use.  Informed consent was obtained after describing risk and benefits of the procedure with the patient, this includes bleeding, bruising, infection, paralysis and medication side effects.  The patient wishes to proceed and has given written consent.  Patient was placed in a prone position.  The lumbar area was marked and prepped with Betadine.  It was entered with a 25-gauge 1-1/2 inch needle and one mL of 1% lidocaine was injected into the skin and subcutaneous tissue.  Then a 17-gauge spinal needle was inserted under fluoroscopic guidance into the L5-S1 interlaminar space under AP and Lateral imaging.  Once needle tip of approximated the posterior elements, a loss of resistance technique was utilized with lateral imaging.  A positive loss of resistance was obtained and then confirmed by injecting 2 mL's of Omnipaque 180.  Then a solution containing 1.5 mL's of 6mg /ml Celestone and 1.5 mL's of 1% lidocaine was injected.  The patient tolerated procedure well.  Post procedure instructions were given.  Please see post procedure form.  Omnipaque 180 2ml , 8ml waste Celestone 6mg  per ml 1.5 ml no waste Lidocaine 5ml MPF no waste

## 2023-05-26 NOTE — Telephone Encounter (Signed)
Left message to call back  

## 2023-05-26 NOTE — Patient Instructions (Signed)

## 2023-05-29 ENCOUNTER — Telehealth: Payer: Self-pay

## 2023-05-29 MED ORDER — GABAPENTIN 400 MG PO CAPS: 400.0000 mg | ORAL_CAPSULE | Freq: Every day | ORAL | 1 refills | Status: DC

## 2023-05-29 NOTE — Telephone Encounter (Signed)
Message sent to Dr.Schumann for review.

## 2023-05-29 NOTE — Telephone Encounter (Signed)
Left message to call back  

## 2023-05-29 NOTE — Telephone Encounter (Signed)
Patient called back, he said he is only taking losartan 50mg .  He is not taking lisinopril.

## 2023-05-29 NOTE — Telephone Encounter (Signed)
Patient called back, he said he is only taking losartan 50mg .  He is not taking lisinopril.     Med list updated

## 2023-05-29 NOTE — Telephone Encounter (Signed)
Patient returned RN's call. 

## 2023-05-29 NOTE — Addendum Note (Signed)
Addended by: Scheryl Marten on: 05/29/2023 12:58 PM   Modules accepted: Orders

## 2023-05-29 NOTE — Telephone Encounter (Signed)
Can we clarify his losartan dose, does he mean 50 mg? He is listed as taking both losartan 50 mg daily and lisinopril 40 mg daily.  Want to ensure he is only taking 1 of these.  Can we update med list?    Called and left the information above on pt's voicemail- Need him to verify the information above, then update the medication list

## 2023-05-29 NOTE — Telephone Encounter (Signed)
Patient informed. 

## 2023-07-07 ENCOUNTER — Encounter: Payer: Self-pay | Admitting: Physical Medicine & Rehabilitation

## 2023-07-07 ENCOUNTER — Encounter: Payer: 59 | Admitting: Physical Medicine & Rehabilitation

## 2023-07-07 ENCOUNTER — Encounter: Payer: 59 | Attending: Physical Medicine & Rehabilitation | Admitting: Physical Medicine & Rehabilitation

## 2023-07-07 VITALS — BP 137/95 | HR 68 | Ht 70.0 in | Wt 283.0 lb

## 2023-07-07 DIAGNOSIS — M48061 Spinal stenosis, lumbar region without neurogenic claudication: Secondary | ICD-10-CM | POA: Insufficient documentation

## 2023-07-07 NOTE — Progress Notes (Signed)
Subjective:    Patient ID: Michael [name], male    DOB: 1970/12/31, 52 y.o.   MRN: [id number]  HPI CC LLE pain   52 yo male with lumbar spinal stenosis with lower ext dysesthesias but no clear cut neurogenic claudication .  Pt returns after Left L5-S1 translaminar ESI Left paramedian  No new issues except very tired this am , states he took his gabapentin early this am.  Typically goes to sleep about 11p , we discussed that he should take gabapentin at 11p.    MRI LUMBAR SPINE WITHOUT CONTRAST   TECHNIQUE: Multiplanar, multisequence MR imaging of the lumbar spine was performed. No intravenous contrast was administered.   COMPARISON:  Lumbar spine radiographs 01/10/2022; MRI lumbar spine 04/03/2021   FINDINGS: Segmentation:  Standard.   Alignment:  No sagittal spondylolisthesis.   Vertebrae: Vertebral body heights are maintained. Mild-to-moderate posterior L5-S1 disc space narrowing is minimally worsened from 04/03/2021. Small anterior inferior left L5 endplate cyst is unchanged. Interval decrease in now mild marrow edema throughout the majority of the L5 vertebral body with slight interval increase in moderate diffuse inferior greater than superior L5 vertebral body chronic fat intensity marrow changes. No acute fracture or destructive bone lesion.   Conus medullaris and cauda equina: Conus extends to the L1-2 level. Conus and cauda equina appear normal.   Paraspinal and other soft tissues: The kidneys are partially visualized. There are multiple decreased T1 and increased T2 signal bilateral renal cysts that are similar to prior. Incidental note of normal variant retroaortic left renal vein.   Disc levels:   T12-L1: Unremarkable.   L1-2: Minimal extension of disc into the inferior aspect of the bilateral neural foramina. No central canal or neuroforaminal stenosis. No significant change.   L2-3: Mild bilateral facet joint hypertrophy. Mild broad-based posterior disc  bulge and endplate spurring. No central canal stenosis. No neuroforaminal stenosis. No significant change.   L3-4: Mild bilateral facet joint hypertrophy. Mild broad-based posterior disc osteophyte complex with left greater than right intraforaminal extension. Borderline mild left neuroforaminal stenosis, unchanged. No central canal stenosis.   L4-5: Moderate bilateral facet joint hypertrophy. Small left-greater-than-right facet joint fusions, similar to prior. Mild-to-moderate broad-based posterior disc osteophyte complex with bilateral intraforaminal extension. Mild bilateral neuroforaminal stenosis, unchanged. Mild narrowing of the lateral recesses and borderline mild central canal stenosis along the inferior L4-5 level and superior L5 vertebral body, unchanged.   L5-S1: Moderate bilateral facet joint hypertrophy. Moderate broad-based posterior disc osteophyte complex with moderate left-greater-than-right intraforaminal extension midline posterior disc protrusion measures up to 6 mm in AP dimension. This appears unchanged on axial images but may be minimally increased from 5 mm to 6 mm on sagittal images. Alternatively this change may be due to differences in slice selection on the sagittal sequence. Moderate to severe left and moderate right lateral recess stenosis and mild-to-moderate left-greater-than-right central canal stenosis, unchanged. Moderate left and mild-to-moderate right neuroforaminal stenosis, unchanged.   IMPRESSION: Compared to 04/03/2021:   1. Multilevel degenerative disc and joint changes as above. 2. Mild L4-5 lateral recess and central canal stenosis and L5-S1 moderate to severe left and moderate right lateral recess stenosis mild-to-moderate left-greater-than-right central canal stenosis. 3. Multilevel neuroforaminal stenosis including mild bilateral L4-5 and moderate left and mild-to-moderate right L5-S1 neuroforaminal stenosis, unchanged.      Electronically Signed   By: Neita Garnet M.D.   On: 06/01/2022 15:57   Pain Inventory Average Pain 10 Pain Right Now 9 My pain is constant, sharp,  tingling, and aching  In the last 24 hours, has pain interfered with the following? General activity 8 Relation with others 9 Enjoyment of life 10 What TIME of day is your pain at its worst? evening and night Sleep (in general) Poor  Pain is worse with: walking, sitting, standing, and some activites Pain improves with: medication Relief from Meds: 2  Family History  Problem Relation Age of Onset   Cancer Father        prostate, colon- age was in his 55's   Heart disease Father 12       MI    Hypertension Father    Colon cancer Father    Diabetes Mother    Hypertension Mother    Hypertension Brother    Cancer Maternal Grandfather    Cancer Paternal Grandfather    Esophageal cancer Neg Hx    Stomach cancer Neg Hx    Rectal cancer Neg Hx    Social History   Socioeconomic History   Marital status: Single    Spouse name: Not on file   Number of children: 4   Years of education: Not on file   Highest education level: Not on file  Occupational History   Occupation: Unemployed//disabled  Tobacco Use   Smoking status: Never   Smokeless tobacco: Never  Vaping Use   Vaping Use: Never used  Substance and Sexual Activity   Alcohol use: No    Alcohol/week: 0.0 standard drinks of alcohol   Drug use: Yes    Types: Marijuana   Sexual activity: Yes    Comment: per pt's blue health form - 1 sex partner in last 12 months  Other Topics Concern   Not on file  Social History Narrative   Marital status: single; living with fiance      Children: 4 children, no grandchildren.      Employment:  Disability for learning disabilities since 2012.   Right-handed   Caffeine: occasional         Social Determinants of Health   Financial Resource Strain: High Risk (09/08/2021)   Overall Financial Resource Strain (CARDIA)     Difficulty of Paying Living Expenses: Very hard  Food Insecurity: No Food Insecurity (09/08/2021)   Hunger Vital Sign    Worried About Running Out of Food in the Last Year: Never true    Ran Out of Food in the Last Year: Never true  Transportation Needs: No Transportation Needs (09/08/2021)   PRAPARE - Administrator, Civil Service (Medical): No    Lack of Transportation (Non-Medical): No  Physical Activity: Inactive (09/08/2021)   Exercise Vital Sign    Days of Exercise per Week: 0 days    Minutes of Exercise per Session: 0 min  Stress: Stress Concern Present (09/08/2021)   [employer] of Occupational Health - Occupational Stress Questionnaire    Feeling of Stress : To some extent  Social Connections: Socially Isolated (09/08/2021)   Social Connection and Isolation Panel [NHANES]    Frequency of Communication with Friends and Family: More than three times a week    Frequency of Social Gatherings with Friends and Family: Once a week    Attends Religious Services: Never    Database administrator or Organizations: No    Attends Banker Meetings: Never    Marital Status: Never married   Past Surgical History:  Procedure Laterality Date   Admission  04/2012   Malignant HTN, HA.  CT head negative, cardiology  consult.     CYSTOSCOPY WITH RETROGRADE PYELOGRAM, URETEROSCOPY AND STENT PLACEMENT Left 01/24/2019   Procedure: CYSTOSCOPY WITH RETROGRADE PYELOGRAM, URETEROSCOPY AND STENT PLACEMENT;  Surgeon: Malen Gauze, MD;  Location: [facility];  Service: Urology;  Laterality: Left;   HOLMIUM LASER APPLICATION Left 01/24/2019   Procedure: HOLMIUM LASER APPLICATION;  Surgeon: Malen Gauze, MD;  Location: [facility];  Service: Urology;  Laterality: Left;   MANDIBLE SURGERY  1998   metal plate   Past Surgical History:  Procedure Laterality Date   Admission  04/2012   Malignant HTN, HA.  CT head negative, cardiology consult.      CYSTOSCOPY WITH RETROGRADE PYELOGRAM, URETEROSCOPY AND STENT PLACEMENT Left 01/24/2019   Procedure: CYSTOSCOPY WITH RETROGRADE PYELOGRAM, URETEROSCOPY AND STENT PLACEMENT;  Surgeon: Malen Gauze, MD;  Location: [facility];  Service: Urology;  Laterality: Left;   HOLMIUM LASER APPLICATION Left 01/24/2019   Procedure: HOLMIUM LASER APPLICATION;  Surgeon: Malen Gauze, MD;  Location: [facility];  Service: Urology;  Laterality: Left;   MANDIBLE SURGERY  1998   metal plate   Past Medical History:  Diagnosis Date   Anxiety    Depression    Questionable per records. Not on any medication.    History of kidney stones    Hx of echocardiogram    a. Echo 12/13/12: Mild LVH, EF 50-55%, grade 1 diastolic dysfunction, mild LAE, mild RVE, mild RAE   Hypertension    Insomnia    Left knee pain    Low back pain    Migraine    Noncompliance    OSA (obstructive sleep apnea)    a. Sleep Study 02/2013:  mod OSA, AHI 33 per hour, O2 sat nadir 85%   Ht 5\' 10"  (1.778 m)   Wt 283 lb (128.4 kg)   BMI 40.61 kg/m   Opioid Risk Score:   Fall Risk Score:  `1  Depression screen [facility] 2/9     07/07/2023    9:46 AM 02/10/2023   10:34 AM 09/08/2021   11:36 AM 09/08/2021   11:35 AM 07/05/2021    9:22 AM 03/20/2021    8:22 AM 02/27/2021   10:48 AM  Depression screen PHQ 2/9  Decreased Interest 1 3 0 0 0 0 0  Down, Depressed, Hopeless 1 3 1 1  0 0 3  PHQ - 2 Score 2 6 1 1  0 0 3  Altered sleeping  3 3  0  2  Tired, decreased energy  3 2  0  3  Change in appetite  2 1  0  3  Feeling bad or failure about yourself   3 1  0  2  Trouble concentrating  0 0  0  1  Moving slowly or fidgety/restless  2 2  0  1  Suicidal thoughts  3 0  0  1  PHQ-9 Score  22 10  0  16  Difficult doing work/chores  Extremely dIfficult Very difficult  Somewhat difficult      Review of Systems  Musculoskeletal:  Positive for back pain and neck pain.       Pain both hips, left heel  All other  systems reviewed and are negative.     Objective:   Physical Exam Neg SLR bilaterally Motor 5/5 BLE Sensation intact LT in BLE Amb without assistive device no toe drag or knee instability Lumbar spine with 50% ROM, flex ext lateral bending No  tendernessto palp in lumbar paraspinals       Assessment & Plan:   Lumbar spinal stenosis , short term benefit after lumbar ESI L L5-S1 trans lam, will try transforaminal approach , may need retro neural given his severe stenosis Cont Gabapentin 400mg  but take it 11pm not in early am

## 2023-07-29 ENCOUNTER — Emergency Department (HOSPITAL_COMMUNITY)
Admission: EM | Admit: 2023-07-29 | Discharge: 2023-07-29 | Disposition: A | Discharge status: Home / Self Care | Payer: 59 | Source: Home / Self Care | Attending: Emergency Medicine | Admitting: Emergency Medicine

## 2023-07-29 ENCOUNTER — Other Ambulatory Visit: Payer: Self-pay

## 2023-07-29 ENCOUNTER — Encounter (HOSPITAL_COMMUNITY): Payer: Self-pay

## 2023-07-29 ENCOUNTER — Emergency Department (HOSPITAL_COMMUNITY): Payer: 59

## 2023-07-29 DIAGNOSIS — Z79899 Other long term (current) drug therapy: Secondary | ICD-10-CM | POA: Insufficient documentation

## 2023-07-29 DIAGNOSIS — E876 Hypokalemia: Secondary | ICD-10-CM | POA: Insufficient documentation

## 2023-07-29 DIAGNOSIS — I509 Heart failure, unspecified: Secondary | ICD-10-CM | POA: Diagnosis not present

## 2023-07-29 DIAGNOSIS — I110 Hypertensive heart disease with heart failure: Secondary | ICD-10-CM | POA: Diagnosis present

## 2023-07-29 DIAGNOSIS — R030 Elevated blood-pressure reading, without diagnosis of hypertension: Secondary | ICD-10-CM

## 2023-07-29 LAB — CBC
HCT: 40.5 % (ref 39.0–52.0)
Hemoglobin: 13.2 g/dL (ref 13.0–17.0)
MCH: 28.9 pg (ref 26.0–34.0)
MCHC: 32.6 g/dL (ref 30.0–36.0)
MCV: 88.6 fL (ref 80.0–100.0)
Platelets: 184 10*3/uL (ref 150–400)
RBC: 4.57 MIL/uL (ref 4.22–5.81)
RDW: 14.8 % (ref 11.5–15.5)
WBC: 4.6 10*3/uL (ref 4.0–10.5)
nRBC: 0 % (ref 0.0–0.2)

## 2023-07-29 LAB — TROPONIN I (HIGH SENSITIVITY): Troponin I (High Sensitivity): 6 ng/L (ref <18)

## 2023-07-29 LAB — BASIC METABOLIC PANEL
Anion gap: 13 (ref 5–15)
BUN: 15 mg/dL (ref 6–20)
CO2: 24 mmol/L (ref 22–32)
Calcium: 8.7 mg/dL — ABNORMAL LOW (ref 8.9–10.3)
Chloride: 104 mmol/L (ref 98–111)
Creatinine, Ser: 1.03 mg/dL (ref 0.61–1.24)
GFR, Estimated: >60 mL/min (ref >60)
Glucose, Bld: 116 mg/dL — ABNORMAL HIGH (ref 70–99)
Potassium: 3.2 mmol/L — ABNORMAL LOW (ref 3.5–5.1)
Sodium: 141 mmol/L (ref 135–145)

## 2023-07-29 MED ORDER — POTASSIUM CHLORIDE CRYS ER 20 MEQ PO TBCR
20.0000 meq | EXTENDED_RELEASE_TABLET | Freq: Once | ORAL | Status: AC
  Administered 2023-07-29: 20 meq via ORAL
  Filled 2023-07-29: qty 1

## 2023-07-29 NOTE — ED Provider Notes (Signed)
[facility]ER[facility]ENC[facility] DEPARTMENT AT [facility] Provider Note   CSN: [id number] Arrival date & time: 07/29/23  1406     History  Chief Complaint  Patient presents with   Hypertension    Michael [name] is a 52 y.o. male with a past medical history significant for hypertension, depression, history of kidney stones, chronic low back pain, CHF who presents to the ED due to elevated blood pressure.  Patient's home health nurse checked his blood pressure at home which was 173/130 and was advised to report to the ED for further evaluation.  Patient is currently on losartan, amlodipine, and carvedilol which he has been compliant with.  Denies any missed doses.  Patient is currently asymptomatic.  Denies headache, visual changes, speech changes, unilateral weakness, chest pain, and shortness of breath.  No recent medication changes over the past year.   Michael [name] is at bedside.   History obtained from patient, Michael [name],  and past medical records. No interpreter used during encounter.       Home Medications Prior to Admission medications   Medication Sig Start Date End Date Taking? Authorizing Provider  allopurinol (ZYLOPRIM) 100 MG tablet Take 1 tablet (100 mg total) by mouth daily. 06/18/22  Yes Kerrin Champagne, MD  amLODipine (NORVASC) 10 MG tablet Take 1 tablet (10 mg total) by mouth daily. 08/27/21  Yes Zonia Kief, Amy J, NP  atorvastatin (LIPITOR) 20 MG tablet Take 1 tablet (20 mg total) by mouth daily. 12/25/22 12/20/23 Yes Little Ishikawa, MD  carvedilol (COREG) 6.25 MG tablet Take 1 tablet by mouth twice daily 12/31/21  Yes Zonia Kief, Amy J, NP  DULoxetine (CYMBALTA) 60 MG capsule Take 60 mg by mouth 2 (two) times daily. 11/07/22  Yes [provider]  gabapentin (NEURONTIN) 400 MG capsule Take 1 capsule (400 mg total) by mouth at bedtime. 05/29/23  Yes Kirsteins, Victorino Sparrow, MD  losartan (COZAAR) 50 MG tablet Take 50 mg by mouth daily. 11/08/22  Yes [provider]   tamsulosin (FLOMAX) 0.4 MG CAPS capsule Take 0.4 mg by mouth daily. 01/16/23  Yes [provider]      Allergies    Statins    Review of Systems   Review of Systems  Constitutional:  Negative for fever.  Eyes:  Negative for visual disturbance.  Respiratory:  Negative for shortness of breath.   Cardiovascular:  Negative for chest pain.  Gastrointestinal:  Negative for abdominal pain, nausea and vomiting.  Neurological:  Negative for dizziness and headaches.    Physical Exam Updated Vital Signs BP (!) 157/116 (BP Location: Right Arm) Comment: large cuff  Pulse 69   Temp 97.7 F (36.5 C) (Oral)   Resp 20   Ht 5\' 10"  (1.778 m)   Wt 128.4 kg   SpO2 100%   BMI 40.62 kg/m  Physical Exam Vitals and nursing note reviewed.  Constitutional:      General: He is not in acute distress.    Appearance: He is not ill-appearing.  HENT:     Head: Normocephalic.  Eyes:     Pupils: Pupils are equal, round, and reactive to light.  Cardiovascular:     Rate and Rhythm: Normal rate and regular rhythm.     Pulses: Normal pulses.     Heart sounds: Normal heart sounds. No murmur heard.    No friction rub. No gallop.  Pulmonary:     Effort: Pulmonary effort is normal.     Breath sounds: Normal breath sounds.  Abdominal:  General: Abdomen is flat. There is no distension.     Palpations: Abdomen is soft.     Tenderness: There is no abdominal tenderness. There is no guarding or rebound.  Musculoskeletal:        General: Normal range of motion.     Cervical back: Neck supple.  Skin:    General: Skin is warm and dry.  Neurological:     General: No focal deficit present.     Mental Status: He is alert.  Psychiatric:        Mood and Affect: Mood normal.        Behavior: Behavior normal.     ED Results / Procedures / Treatments   Labs (all labs ordered are listed, but only abnormal results are displayed) Labs Reviewed  BASIC METABOLIC PANEL - Abnormal; Notable for the  following components:      Result Value   Potassium 3.2 (*)    Glucose, Bld 116 (*)    Calcium 8.7 (*)    All other components within normal limits  CBC  TROPONIN I (HIGH SENSITIVITY)    EKG EKG Interpretation Date/Time:  Wednesday July 29 2023 14:21:00 EDT Ventricular Rate:  73 PR Interval:  182 QRS Duration:  96 QT Interval:  422 QTC Calculation: 464 R Axis:   -22  Text Interpretation: Normal sinus rhythm Abnormal ECG When compared with ECG of 29-Jun-2021 13:28, PREVIOUS ECG IS PRESENTNo sig changes Confirmed by Alvester Chou [phone]) on 07/29/2023 3:28:23 PM  Radiology DG Chest 2 View  Result Date: 07/29/2023 CLINICAL DATA:  htn EXAM: CHEST - 2 VIEW COMPARISON:  11/25/2021. FINDINGS: Bilateral lung fields are clear. Bilateral costophrenic angles are clear. Stable mildly enlarged cardio-mediastinal silhouette. Unfolding of aortic arch noted. No acute osseous abnormalities. The soft tissues are within normal limits. IMPRESSION: No active cardiopulmonary disease. Electronically Signed   By: Jules Schick M.D.   On: 07/29/2023 15:27    Procedures Procedures    Medications Ordered in ED Medications  potassium chloride SA (KLOR-CON M) CR tablet 20 mEq (20 mEq Oral Given 07/29/23 1549)    ED Course/ Medical Decision Making/ A&P                                 Medical Decision Making Amount and/or Complexity of Data Reviewed Independent Historian: parent Labs: ordered. Decision-making details documented in ED Course. Radiology: ordered and independent interpretation performed. Decision-making details documented in ED Course.  Risk Prescription drug management.   52 year old male with history of hypertension presents to the ED due to elevated blood pressure reading.  Patient's blood pressure was checked by his home health nurse and was advised to report to the ED.  BP was 173/130 at home.  Patient is currently asymptomatic and denies headache, visual changes, speech  changes, unilateral weakness, chest pain, and shortness of breath.  Patient has been compliant with his losartan, carvedilol, and amlodipine.  No recent dose changes.  Upon arrival BP 180/137.  Rechecked BP during initial evaluation which was 156/115.  Routine labs ordered in triage to rule out endorgan damage.  CBC unremarkable.  No leukocytosis.  Normal hemoglobin.  BMP significant for hypokalemia 3.2.  Mild hyperglycemia 116.  No anion gap.  Normal renal function.  Troponin normal.  Chest x-ray personally reviewed and interpreted which is negative for signs of pneumonia, pneumothorax, or widened mediastinum.  EKG demonstrates normal sinus rhythm.  No signs of acute  ischemia.  No evidence evidence of endorgan damage.  Given patient is currently asymptomatic with reassuring labs low suspicion for hypertensive emergency/urgency.  Patient was able to get a PCP appointment Friday at 9AM to recheck BP and to discuss medication changes.  Do not feel patient needs further work-up at this time given BP has slightly improved. I had a long discussion with patient and his Michael [name] to continue taking his BP medications as prescribed. Strict ED precautions discussed with patient. Patient states understanding and agrees to plan. Patient discharged home in no acute distress and stable vitals  Discussed with Dr. Renaye Rakers who agrees with assessment and plan.  Lives at home Has PCP Hx HTN        Final Clinical Impression(s) / ED Diagnoses Final diagnoses:  Elevated blood pressure reading    Rx / DC Orders ED Discharge Orders     None         Jesusita Oka 07/29/23 1559    Terald Sleeper, MD 07/29/23 [id number]

## 2023-07-29 NOTE — Discharge Instructions (Addendum)
It was a pleasure taking care of you today.  As discussed, your blood pressure was elevated in the ER.  Your labs are reassuring.  Your potassium was slightly low.  You were given potassium here in the ER.  Please follow-up with PCP at your scheduled PCP appointment on Friday for blood pressure recheck and to discuss possible medication changes.  Return to the ER if you develop a headache, vision changes, speech changes, elevated BP, chest pain, or any new or worsening symptoms.

## 2023-07-29 NOTE — ED Triage Notes (Signed)
Pt's PCP sent pt here, because bp was 173/130 in her office. Per The PCP she told me on the phone that pt's bp med bottles had a lot of pills in them despite him telling her he is taking his bp meds. Pt denies HA, vision changes or dizziness.

## 2023-08-13 ENCOUNTER — Encounter: Payer: 59 | Attending: Physical Medicine & Rehabilitation | Admitting: Physical Medicine & Rehabilitation

## 2023-08-13 DIAGNOSIS — M48061 Spinal stenosis, lumbar region without neurogenic claudication: Secondary | ICD-10-CM | POA: Insufficient documentation

## 2023-08-17 ENCOUNTER — Other Ambulatory Visit: Payer: Self-pay | Admitting: Physical Medicine & Rehabilitation

## 2023-08-17 DIAGNOSIS — M48061 Spinal stenosis, lumbar region without neurogenic claudication: Secondary | ICD-10-CM

## 2023-08-18 ENCOUNTER — Encounter: Payer: 59 | Admitting: Physical Medicine & Rehabilitation

## 2023-09-22 ENCOUNTER — Ambulatory Visit: Payer: 59 | Admitting: Physical Medicine & Rehabilitation

## 2023-09-23 ENCOUNTER — Encounter (INDEPENDENT_AMBULATORY_CARE_PROVIDER_SITE_OTHER): Payer: Self-pay | Admitting: Otolaryngology

## 2023-09-23 ENCOUNTER — Ambulatory Visit (INDEPENDENT_AMBULATORY_CARE_PROVIDER_SITE_OTHER): Payer: 59 | Admitting: Otolaryngology

## 2023-09-23 VITALS — BP 139/89 | HR 70 | Ht 70.0 in | Wt 293.0 lb

## 2023-09-23 DIAGNOSIS — G4733 Obstructive sleep apnea (adult) (pediatric): Secondary | ICD-10-CM | POA: Diagnosis not present

## 2023-09-23 DIAGNOSIS — R0981 Nasal congestion: Secondary | ICD-10-CM | POA: Diagnosis not present

## 2023-09-23 DIAGNOSIS — J342 Deviated nasal septum: Secondary | ICD-10-CM | POA: Diagnosis not present

## 2023-09-23 DIAGNOSIS — Z6841 Body Mass Index (BMI) 40.0 and over, adult: Secondary | ICD-10-CM | POA: Diagnosis not present

## 2023-09-23 DIAGNOSIS — K219 Gastro-esophageal reflux disease without esophagitis: Secondary | ICD-10-CM

## 2023-09-23 DIAGNOSIS — J351 Hypertrophy of tonsils: Secondary | ICD-10-CM

## 2023-09-23 DIAGNOSIS — Z789 Other specified health status: Secondary | ICD-10-CM

## 2023-09-23 NOTE — Patient Instructions (Addendum)
-   see your PCP, Dr Clelia Croft to inquire about weight loss alternatives - schedule a consultation with Medical Nutritionist - return in 4-6 months to discuss repeat sleep study and Inspire Implant  - see sleep medicine for evaluation and repeat sleep study

## 2023-09-23 NOTE — Progress Notes (Signed)
SUBJECTIVE:  Chief Complaint: severe OSA and CPAP intolerance    Referring sleep doc: self-referred   HPI: Pt is a 52 y.o. year old male with a h/o OSA dx approx 4 yrs ago. Since diagnosis, pt has not been able to tolerate their CPAP/BiPAP device. His diagnostic sleep study was in 02/17/2013 with evidence of severe OSA (AHI of 32.7) and reportedly significant number of central apneas. He was recommended for BiPAP. Pt has not been able to tolerate CPAP due to facial discomfort with mask in place and inability to sleep with the mask in place. Pt has tried full-face and nasal pillow mask for their OSA. Body mass index is 42.04 kg/m. Pt has not had a DISE exam in the past. Pt lost  some weight since his last visit with [facility] for OSA (was BMI of 44).   He states he has fragmented sleep, wakes up frequently several times every 1-2 hrs after falling asleep. He goes to bed around 10 pm, gets up 6 am. He has never tried any medications for insomnia. He is on Gabapentin for chronic pain along the mandible following mandibular fracture 1998. He tried wearing CPAP and it causes discomfort around his nasal passages. He feels that the mask causes difficulty with breathing, and tried both full-face and nasal mask. He is not able to sleep with CPAP and stopped using CPAP ~ 2 yrs ago. Hx of CHF, denies hx of stroke or MI.   Records reviewed:  Note from [facility] Amy Lomax 01/09/2020 Jamier Newey is a 52 y.o. male here today for follow up for severe OSA. After follow up with Dr Vickey Huger in 07/2019, sleep study was repeated and confirmed severe OSA. Bipap was recommended. He reports that he just recently started using BiPAP at home. He has used BiPAP for approximately 8 days since initiation in October.  He states he has had some difficulty getting used to the machine.  He currently uses nasal pillows and feels that he would do better with a full facemask.  He has motivated to continue working on  compliance.   Compliance report dated 12/10/2019 through 01/08/2020 reveals that he used BiPAP 8 of the last 30 days for compliance of 27%.  He did not use BiPAP greater than 4 hours during the last 30 days.  Average usage was 1 hour and 34 minutes.  Residual AHI was 0.9 with IPAP of 20 cm of water and EPAP of 11 cm of water.  Pressure support of 4 cm of water.  There was no significant leak noted.   HISTORY: (copied from Dr Dohmeier's note on 08/18/2019)   HPI:  Amoni Mosher is a 52 y.o. male , and seen on 08-18-2019 upon a new  referral  from Dr. Leavy Cella for a sleep consultation,   As I had described previously Mr. Dedmon had been diagnosed with obstructive sleep apnea at the [facility] sleep center, He underwent a polysomnography on 17 February 2013 the results with an AHI of 32.7/h slight elevation and RDI of 34.3/h, there were more central apneas and obstructive apneas that this is a complex apnea situation.  He did not have PLM's, oxygen nadir was 85% EKG indicated tachycardia for much of the night.  He did not have prolonged hypoxemia.  He returned for a CPAP titration but could not tolerate the [facility] mask which is a full facemask in large size.   He continues to wake up fatigued he has loud and continuous snoring,  sometimes he will be choking.  He has often trouble falling asleep, has frequent respiratory visits sometimes morning headaches sometimes nightmares.  He wakes up with a dry mouth usually.  In addition he has been untreated now for several years but has developed more comorbidities these include an ejection fraction reduction to 50% grade 1 diastolic dysfunction, hypertension, he has been using inhalers as needed for wheezing, he has been on 3 or more drugs to control hypertension and he also has a prescription for as needed hydrocodone. He has used this for migraine and he breaks the tablet in half he reports.   There has also been changes in his social history, he was laid  off during the coronavirus pandemic and since May has been unemployed.  He spends more time in bed, he reports insomnia, tossing and turning and some nights he will not go to sleep before 3 AM. His son passed in 2018, killed in a MVA at age 2, he was not the driver. The driver was drunk.    He is interested in seeing if there are alternatives to the CPAP therapy, he has heard about the inspire procedure and device.  However the inspire device has been limited to patients with a BMI of 32 or less and at this time Mr. courts BMI is 44.5.   I would like to add that his blood pressure has been 150s over 111 today in spite of being on a total of 5 blood pressure medications.   Mr. Carrara was seen here on 05/06/2017 for a sleep consultation.  He had on April 5 been seen at primary care at [facility] and was diagnosed with elevated blood pressure, he has been on antihypertensive medication but needed refills at the time. He has a history of asthma, abnormal EKG, malignant hypertension and migraine headaches with aura as well as obstructive sleep apnea. He was diagnosed with OSA over 4 years ago but has not gotten a CPAP. He needed to be re-referred for a new sleep study.   Mr. Laspina is obese, reports lethargy, insomnia, shortness of breath, headaches, witnessed snoring and has frequent apneas, and is currently not gainfully employed, he presented here today with his mother [place] and is four-year-old nephew.   Sleep habits are as follows: The patient reports that he sleeps every night in bed but takes longer to go to sleep after he goes into the bedroom. He does not have a set bedtime, his bedtime may average 2 AM, always after midnight. His mother describes that he takes . Naps throughout the day and evening while he watches TV, and he watches TV in the bedroom as well. With the TV is switched off he seems to wake up. He seems to sleep in intervals of the maximal 2 hour duration, and well into the morning hours.  For example he may sleep from 7 AM to 9 AM. He takes diuretics and has to go to the bathroom frequently. He also has nocturia before he takes his diuretic in the morning. He reports off and on waking up with headaches, headaches wake him up, too. He wakes always exhausted, unrefreshed. His TV is now turned off after late night news.   Past Medical/Surgical History He His  has a past surgical history that includes Mandible surgery (1998); Admission (04/2012); Cystoscopy with retrograde pyelogram, ureteroscopy and stent placement (Left, 01/24/2019); and Holmium laser application (Left, 01/24/2019).  Past Family/Social History His family history includes Cancer in his father, maternal grandfather, and  paternal grandfather; Colon cancer in his father; Diabetes in his mother; Heart disease (age of onset: 47) in his father; Hypertension in his brother, father, and mother. He  reports that he has never smoked. He has never used smokeless tobacco. He reports current drug use. Drug: Marijuana. He reports that he does not drink alcohol.  Medications/Allergies/Immunizations His current medication(s) include:  Allergies: Statins, Immunizations:  Immunization History  Administered Date(s) Administered   Influenza Split 10/23/2012   Influenza,inj,Quad PF,6+ Mos 09/22/2013, 10/03/2015, 10/02/2020   Influenza,inj,Quad PF,6-35 Mos 12/31/2017   Influenza,inj,quad, With Preservative 09/22/2013, 10/03/2015   PFIZER(Purple Top)SARS-COV-2 Vaccination 03/15/2020, 04/09/2020   Tdap 12/30/2011   Zoster Recombinant(Shingrix) 10/02/2020, 07/05/2021    Review of Systems  ROS: Constitutional: Negative for fever, weight loss and weight gain. Cardiovascular: Negative for chest pain and dyspnea on exertion. Respiratory: Is not experiencing shortness of breath at rest. Gastrointestinal: Negative for nausea and vomiting. Neurological: Negative for headaches. Psychiatric: The patient is not  nervous/anxious  OBJECTIVE:  Physical Exam Vitals:  Vitals:   09/23/23 0947  BP: 139/89  Pulse: 70  SpO2: 90%   General:  Well-developed, well-nourished, no apparent distress BMI: Body mass index is 42.04 kg/m. Neck circumference 17.5" Respiratory Respiratory effort:  Equal inspiration and expiration without stridor Auscultation:  Equal breath sounds bilaterally Cardiovascular Heart:  regular rate and rhythm Peripheral Vascular:  Warm extremities with equal pulses Eyes: No nystagmus with equal extraocular motion bilaterally Neuro/Psych/Balance: Patient oriented to person, place, and time;  Appropriate mood and affect;  Gait is intact with no imbalance; Cranial nerves I-XII are intact Head and Face Inspection:  Normocephalic and atraumatic without mass or lesion Palpation:  Facial skeleton intact without bony stepoffs Salivary Glands:  No masses or tenderness Facial Strength:  Facial motility symmetric and full bilaterally ENT Pinna:  External ear intact and fully developed bilaterally External canal:  Canal is patent with intact skin bilaterally Tympanic Membrane:  Clear and mobile bilaterally External nose:  No scar or anatomic deformity Internal Nose:  Septum intact and midline.  No edema, polyp, or rhinorrhea. Lips, Teeth, and gums:  Mucosa and teeth intact and viable TMJ:  No pain to palpation with full mobility Oral cavity/oropharynx:  No erythema or exudate, 3+ tonsils Tongue/palate position: Friedman 3 Nasopharynx:  No mass or lesion with intact mucosa Hypopharynx:  Intact mucosa without pooling of secretions Larynx:  Full true vocal Hoctor mobility without lesion or mass Neck Neck and Trachea:  Midline trachea without mass or lesion Thyroid:  No mass or nodularity Lymphatics:  No lymphadenopathy  Preoperative diagnosis: OSA  Postoperative diagnosis:   Same + Septal deviation, tonsillar hypertrophy, GERD LPR  Procedure: Flexible fiberoptic  laryngoscopy  Surgeon: Ashok Croon, MD  Anesthesia: Topical lidocaine and Afrin Complications: None Condition is stable throughout exam  Indications and consent:  The patient presents to the clinic with Indirect laryngoscopy view was incomplete. Thus it was recommended that they undergo a flexible fiberoptic laryngoscopy. All of the risks, benefits, and potential complications were reviewed with the patient preoperatively and verbal informed consent was obtained.  Procedure: The patient was seated upright in the clinic. Topical lidocaine and Afrin were applied to the nasal cavity. After adequate anesthesia had occurred, I then proceeded to pass the flexible telescope into the nasal cavity. The nasal cavity was patent without rhinorrhea or polyp. The nasopharynx was also patent without mass or lesion. The base of tongue was visualized and was normal. There were no signs of pooling of secretions in  the piriform sinuses. The true vocal folds were mobile bilaterally. There were no signs of glottic or supraglottic mucosal lesion or mass. There was moderate interarytenoid pachydermia and post cricoid edema. The telescope was then slowly withdrawn and the patient tolerated the procedure throughout.  Studies reviewed:  Diagnostic sleep study done 02/17/2013  (had BMI of 44) AHI of 32.7 Had 63 obstructive events, 79 central events and 4 mixed events  SpO2 nadir 85%  ASSESSMENT/PLAN: Encounter Diagnoses  Name Primary?   BMI 40.0-44.9, adult (HCC)    Obstructive sleep apnea Yes   Intolerance of continuous positive airway pressure (CPAP) ventilation [Z78.9]    Deviated nasal septum    Nasal congestion    Tonsillar hypertrophy     52 yoM with hx of CHF elevated BMI > 40, today at 42, hx of severe OSA diagnosed in 2014, AHI of 32.7 and significant number of central and mixed apneas (central predominant), here to inquire about alternatives to CPAP/BiPAP due to failed multiple trials of foods and  nasal pillow mask and CPAP intolerance.  He was last seen by [facility] neurology in 2020, he reports that he stopped using CPAP 2 years ago.  He reports that he has difficulty breathing when the CPAP mask is on.  Of note also has a component of insomnia with frequent awakenings after he falls asleep, states it happens a couple of times every hour of the night.  He would like to know if he is a candidate for inspire implant surgery. Severe OSA On my exam today he had 3+ tonsillar hypertrophy bilaterally, evidence of Friedman 3 tongue position, and flexible laryngoscopy revealed prominent base of the tongue and elongated uvula.  He also had evidence of septal deviation and nasal congestion with S shaped septum and narrowing of nasal passages.   I discussed criteria for inspire therapy being BMI below 40, and under 25% of central apneas send diagnostic sleep study, recent sleep study without CPAP in place within the last 2 years.  Unfortunately his BMI right now exceeds the cutoff for inspire implant surgery.  He also had significant number of central apneas during his diagnostic sleep study in 2014.  I explained to the patient that based on that he will most likely not qualify for inspire implant.  We discussed strategies for weight loss and I will refer him to weight loss management program at [facility].  I also placed a referral for pulmonary sleep medicine to have a repeat sleep study done and potentially help him with CPAP therapy tolerance in the meantime.  - referral to Sleep Medicine/[facility] (for repeat sleep study and CPAP therapy as well as insomnia evaluation 2. BMI > 40 - Weight loss management referral  RTC 4-5 months   Ashok Croon, MD

## 2023-09-29 ENCOUNTER — Telehealth: Payer: Self-pay

## 2023-09-29 NOTE — Telephone Encounter (Signed)
Called patient left message on personal voice mail to call me back to schedule chest ct due in 12/24.

## 2023-11-19 ENCOUNTER — Other Ambulatory Visit: Payer: Self-pay | Admitting: Physical Medicine & Rehabilitation

## 2023-11-19 DIAGNOSIS — M48061 Spinal stenosis, lumbar region without neurogenic claudication: Secondary | ICD-10-CM

## 2023-11-22 NOTE — Progress Notes (Deleted)
Cardiology Office Note:    Date:  11/22/2023   ID:  Michael [name], DOB August 25, 1971, MRN [id number]  PCP:  Cleatis Polka., MD  Cardiologist:  Little Ishikawa, MD  Electrophysiologist:  None   Referring MD: Cleatis Polka., MD   No chief complaint on file.   History of Present Illness:    Michael [name] is a 52 y.o. male with a hx of hypertension, OSA who presents for follow-up.  He was referred by Ricky Stabs, NP for preop evaluation prior to back surgery, initially seen 11/27/2022.  Previously seen by Dr. Mayford Knife, last seen 05/08/2017.  He reports he has been having shortness of breath, particularly notices it when walking up stairs.  Does not exercise much, limited by back pain.  Denies any chest pain, lightheadedness, syncope, lower extremity edema, or palpitations.  Smoked occasionally as a teenager/early 20s but none since.  Family history includes father had MI in 29s.  Echocardiogram 05/21/2017 showed moderate LVH, EF 60 to 65%, normal RV function, no significant valvular disease.  Lexiscan Myoview 05/22/2017 showed normal perfusion, EF 54%.  Coronary CTA 12/16/2022 showed minimal CAD (1 to 24% stenosis in proximal RCA), calcium score 0, dilated ascending aorta measuring 44 mm.  Echocardiogram 12/25/2022 shows normal biventricular function, no significant valvular disease, dilated ascending aorta measuring 44 mm.  Since last clinic visit,  he reports he has been doing okay.  Continues to have dyspnea and some chest pain.  Main issue has been back pain.  Reports some lightheadedness with standing, denies any syncope.  Denies any lower extremity edema.  Reports some palpitations, especially when walking up stairs.  He has been having trouble tolerating CPAP, has not been using.   BP Readings from Last 3 Encounters:  09/23/23 139/89  07/29/23 (!) 157/116  07/07/23 (!) 137/95     Past Medical History:  Diagnosis Date   Anxiety    Depression    Questionable per records.  Not on any medication.    History of kidney stones    Hx of echocardiogram    a. Echo 12/13/12: Mild LVH, EF 50-55%, grade 1 diastolic dysfunction, mild LAE, mild RVE, mild RAE   Hypertension    Insomnia    Left knee pain    Low back pain    Migraine    Noncompliance    OSA (obstructive sleep apnea)    a. Sleep Study 02/2013:  mod OSA, AHI 33 per hour, O2 sat nadir 85%    Past Surgical History:  Procedure Laterality Date   Admission  04/2012   Malignant HTN, HA.  CT head negative, cardiology consult.     CYSTOSCOPY WITH RETROGRADE PYELOGRAM, URETEROSCOPY AND STENT PLACEMENT Left 01/24/2019   Procedure: CYSTOSCOPY WITH RETROGRADE PYELOGRAM, URETEROSCOPY AND STENT PLACEMENT;  Surgeon: Malen Gauze, MD;  Location: [facility];  Service: Urology;  Laterality: Left;   HOLMIUM LASER APPLICATION Left 01/24/2019   Procedure: HOLMIUM LASER APPLICATION;  Surgeon: Malen Gauze, MD;  Location: [facility];  Service: Urology;  Laterality: Left;   MANDIBLE SURGERY  1998   metal plate    Current Medications: No outpatient medications have been marked as taking for the 11/24/23 encounter (Appointment) with Little Ishikawa, MD.   Current Facility-Administered Medications for the 11/24/23 encounter (Appointment) with Little Ishikawa, MD  Medication   0.9 %  sodium chloride infusion     Allergies:   Statins   Social History   Socioeconomic  History   Marital status: Single    Spouse name: Not on file   Number of children: 4   Years of education: Not on file   Highest education level: Not on file  Occupational History   Occupation: Unemployed//disabled  Tobacco Use   Smoking status: Never   Smokeless tobacco: Never  Vaping Use   Vaping status: Never Used  Substance and Sexual Activity   Alcohol use: No    Alcohol/week: 0.0 standard drinks of alcohol   Drug use: Yes    Types: Marijuana   Sexual activity: Yes    Comment: per  pt's blue health form - 1 sex partner in last 12 months  Other Topics Concern   Not on file  Social History Narrative   Marital status: single; living with fiance      Children: 4 children, no grandchildren.      Employment:  Disability for learning disabilities since 2012.   Right-handed   Caffeine: occasional         Social Determinants of Health   Financial Resource Strain: High Risk (09/08/2021)   Overall Financial Resource Strain (CARDIA)    Difficulty of Paying Living Expenses: Very hard  Food Insecurity: No Food Insecurity (09/08/2021)   Hunger Vital Sign    Worried About Running Out of Food in the Last Year: Never true    Ran Out of Food in the Last Year: Never true  Transportation Needs: No Transportation Needs (09/08/2021)   PRAPARE - Administrator, Civil Service (Medical): No    Lack of Transportation (Non-Medical): No  Physical Activity: Inactive (09/08/2021)   Exercise Vital Sign    Days of Exercise per Week: 0 days    Minutes of Exercise per Session: 0 min  Stress: Stress Concern Present (09/08/2021)   [employer] of Occupational Health - Occupational Stress Questionnaire    Feeling of Stress : To some extent  Social Connections: Unknown (09/30/2023)   Received from [facility]   Social Network    Social Network: Not on file     Family History: The patient's family history includes Cancer in his father, maternal grandfather, and paternal grandfather; Colon cancer in his father; Diabetes in his mother; Heart disease (age of onset: 55) in his father; Hypertension in his brother, father, and mother. There is no history of Esophageal cancer, Stomach cancer, or Rectal cancer.  ROS:   Please see the history of present illness.     All other systems reviewed and are negative.  EKGs/Labs/Other Studies Reviewed:    The following studies were reviewed today:   EKG:   11/27/2022: Normal sinus rhythm, rate 80, T wave inversions in leads  V3-5 05/19/2023: Normal sinus rhythm, rate 75, PACs, nonspecific T wave flattening  Recent Labs: 07/29/2023: BUN 15; Creatinine, Ser 1.03; Hemoglobin 13.2; Platelets 184; Potassium 3.2; Sodium 141  Recent Lipid Panel    Component Value Date/Time   CHOL 151 11/27/2022 1416   TRIG 109 11/27/2022 1416   HDL 44 11/27/2022 1416   CHOLHDL 3.4 11/27/2022 1416   CHOLHDL 3.2 06/13/2015 1118   VLDL 8 06/13/2015 1118   LDLCALC 87 11/27/2022 1416    Physical Exam:    VS:  There were no vitals taken for this visit.    Wt Readings from Last 3 Encounters:  09/23/23 293 lb (132.9 kg)  07/29/23 283 lb 1.1 oz (128.4 kg)  07/07/23 283 lb (128.4 kg)     GEN:  Well nourished, well developed  in no acute distress HEENT: Normal NECK: No JVD; No carotid bruits LYMPHATICS: No lymphadenopathy CARDIAC: RRR, no murmurs, rubs, gallops RESPIRATORY:  Clear to auscultation without rales, wheezing or rhonchi  ABDOMEN: Soft, non-tender, non-distended MUSCULOSKELETAL:  No edema; No deformity  SKIN: Warm and dry NEUROLOGIC:  Alert and oriented x 3 PSYCHIATRIC:  Normal affect   ASSESSMENT:    No diagnosis found.  PLAN:    DOE: He is reporting dyspnea on exertion.  Coronary CTA 12/16/2022 showed minimal CAD (1 to 24% stenosis in proximal RCA), calcium score 0, dilated ascending aorta measuring 44 mm.  Echocardiogram 12/25/2022 shows normal biventricular function, no significant valvular disease, dilated ascending aorta measuring 44 mm.  Aortic dilatation: Ascending aorta measured 44 mm on coronary CTA 12/16/2022.  Measured 44 mm on echocardiogram 12/25/2022 -Plan echocardiogram in 1 year to monitor  LVH: Moderate LVH noted on echo 04/2017.  Likely due to hypertension.  Echo 11/2022 showed mild LVH  Hypertension: On amlodipine 10 mg daily, carvedilol 6.25 mg twice daily, and losartan 50 mg daily -He also has lisinopril 40 mg daily on his med list, he states that he is not taking both losartan and  lisinopril but he is not sure exactly what he is taking.  Asked him to call and let us know his medications when he gets home -Continue carvedilol 6.25 mg twice daily  Morbid obesity: There is no height or weight on file to calculate BMI.  Diet/exercise encouraged  OSA: on CPAP, reports has not been using.  Encouraged compliance.  He reports he is having issues with his CPAP mask, encouraged him to follow-up with sleep medicine  RTC in 6 months***   Medication Adjustments/Labs and Tests Ordered: Current medicines are reviewed at length with the patient today.  Concerns regarding medicines are outlined above.  No orders of the defined types were placed in this encounter.  No orders of the defined types were placed in this encounter.   There are no Patient Instructions on file for this visit.   Signed, Little Ishikawa, MD  11/22/2023 8:08 PM    [facility]

## 2023-11-23 ENCOUNTER — Ambulatory Visit: Payer: 59 | Admitting: Cardiology

## 2023-11-24 ENCOUNTER — Ambulatory Visit: Payer: 59 | Attending: Cardiology | Admitting: Cardiology

## 2023-12-09 ENCOUNTER — Other Ambulatory Visit: Payer: 59

## 2023-12-11 ENCOUNTER — Telehealth: Payer: Self-pay | Admitting: Otolaryngology

## 2023-12-11 NOTE — Telephone Encounter (Signed)
pt agreed to 01-24-23 and will call us if he needs to change it, provider will be out of office 01-22-24

## 2023-12-18 ENCOUNTER — Other Ambulatory Visit: Payer: Self-pay | Admitting: Cardiology

## 2024-01-12 ENCOUNTER — Other Ambulatory Visit: Payer: Self-pay | Admitting: Physical Medicine & Rehabilitation

## 2024-01-12 DIAGNOSIS — M48061 Spinal stenosis, lumbar region without neurogenic claudication: Secondary | ICD-10-CM

## 2024-01-22 ENCOUNTER — Ambulatory Visit (INDEPENDENT_AMBULATORY_CARE_PROVIDER_SITE_OTHER): Payer: 59 | Admitting: Otolaryngology

## 2024-01-25 ENCOUNTER — Ambulatory Visit (INDEPENDENT_AMBULATORY_CARE_PROVIDER_SITE_OTHER): Payer: 59 | Admitting: Otolaryngology

## 2024-02-03 ENCOUNTER — Telehealth: Payer: Self-pay | Admitting: *Deleted

## 2024-02-03 NOTE — Telephone Encounter (Signed)
 Spoke with patient regarding Sleep consult appt. Tomorrow with Roena Clark, NP to try and get download information. He states he has not used his CPAP since 2022.

## 2024-02-04 ENCOUNTER — Ambulatory Visit: Payer: 59 | Admitting: Adult Health

## 2024-02-04 VITALS — BP 120/98 | HR 72

## 2024-02-04 DIAGNOSIS — G4733 Obstructive sleep apnea (adult) (pediatric): Secondary | ICD-10-CM | POA: Diagnosis not present

## 2024-02-04 DIAGNOSIS — I10 Essential (primary) hypertension: Secondary | ICD-10-CM

## 2024-02-04 DIAGNOSIS — R0683 Snoring: Secondary | ICD-10-CM

## 2024-02-04 DIAGNOSIS — G4739 Other sleep apnea: Secondary | ICD-10-CM

## 2024-02-04 DIAGNOSIS — M545 Low back pain, unspecified: Secondary | ICD-10-CM

## 2024-02-04 DIAGNOSIS — G4719 Other hypersomnia: Secondary | ICD-10-CM

## 2024-02-04 NOTE — Patient Instructions (Signed)
 Set up for BIPAP titration study.  Work on healthy weight loss Healthy sleep regimen  Use caution with sedating medications  Follow up with Dr. Gaynell Keeler or Hanni Milford NP in 6 weeks and As needed

## 2024-02-04 NOTE — Progress Notes (Signed)
 @Patient  ID: Michael [name], male    DOB: May 10, 1971, 53 y.o.   MRN: [id number]  Chief Complaint  Patient presents with   Consult    Referring provider: Okey Burns, MD  HPI: 53 year old male seen for sleep consult February 04, 2024 to reestablish for sleep apnea. History of complex sleep apnea with central predominance.  Former patient of Dr. Shellia and Corrie -last seen 2017  Medical history significant for diastolic heart failure, degenerative joint/degenerative disc disease, malignant hypertension, chronic headaches  TEST/EVENTS :  Echo 12/13/12 >> EF 50 to 55%, grade 1 diastolic dysfx PSG 02/17/13 >> AHI 33, SpO2 low 85% Sleep study September 15, 2019 showed AHI at 65.5/hour with a REM AHI 83/hour and supine AHI 90/hour with an SpO2 low at 76%, spent below 89% for 126 minutes. Titration study October 19, 2019 showed optimal control on BiPAP 16/11 cm H2O.  02/04/2024 Sleep consult  Patient presents for sleep consult today to reestablish for sleep apnea.  Patient was diagnosed with sleep apnea greater than 10 years ago.  Initially seen at our office in 2014 with sleep study showing severe sleep apnea with AHI at 33/hour and SpO2 low at 85%.  Patient was started on CPAP but says he was unable to tolerate.  He returned back to our office in 2017 and was recommended to continue on CPAP as he had ongoing significant burden with snoring and daytime sleepiness.  Unfortunately patient says he was unable to wear.  Chart review shows that patient was seen by neurology in 2020 was set up for a sleep study that showed severe sleep apnea with AHI at 65.5/hour with a REM AHI at 83/hour and a supine AHI at 90/hour and SpO2 low at 76% with O2 saturations less than 89% for 126 minutes. Patient was sent for a titration study that showed optimal control on BiPAP 16 over 11 cm H2O.  Patient says he did get a BiPAP and wore it briefly.  He says it is unknown comfortable for him.  He also has not had it in a  while left at his brother's house.  Patient says he has snoring, daytime sleepiness, restless sleep and feels tired all the time.  Epworth score is 16 out of 24.  Typically gets sleepy if he sits down to watch TV, read, rest, in the evening hours and after eating lunch.  Patient has no history of stroke.  No history of systolic congestive heart failure.  Does have diastolic heart failure.  Does not drink any caffeine.  He is not on any chronic pain medications.  He does take gabapentin  and Cymbalta.  Patient says he not very active due to chronic back pain.  No symptoms suspicious for cataplexy or sleep paralysis.  Medical history significant for hypertension, diastolic heart failure, chronic headaches, chronic back pain, gout  Family history Family History  Problem Relation Age of Onset   Cancer Father        prostate, colon- age was in his 31's   Heart disease Father 60       MI    Hypertension Father    Colon cancer Father    Diabetes Mother    Hypertension Mother    Hypertension Brother    Cancer Maternal Grandfather    Cancer Paternal Grandfather    Esophageal cancer Neg Hx    Stomach cancer Neg Hx    Rectal cancer Neg Hx    Past Surgical History:  Procedure Laterality Date   Admission  04/2012   Malignant HTN, HA.  CT head negative, cardiology consult.     CYSTOSCOPY WITH RETROGRADE PYELOGRAM, URETEROSCOPY AND STENT PLACEMENT Left 01/24/2019   Procedure: CYSTOSCOPY WITH RETROGRADE PYELOGRAM, URETEROSCOPY AND STENT PLACEMENT;  Surgeon: Sherrilee Belvie CROME, MD;  Location: [facility];  Service: Urology;  Laterality: Left;   HOLMIUM LASER APPLICATION Left 01/24/2019   Procedure: HOLMIUM LASER APPLICATION;  Surgeon: Sherrilee Belvie CROME, MD;  Location: [facility];  Service: Urology;  Laterality: Left;   MANDIBLE SURGERY  1998   metal plate   Social history patient is single.  He has 4 children.  He is disabled.  He lives with his mom.  He does not smoke  cigarettes.  Does smoke marijuana on occasion.  Says he does not drink alcohol any longer.  Allergies  Allergen Reactions   Statins Other (See Comments)    Unknown reaction    Immunization History  Administered Date(s) Administered   Influenza Split 10/23/2012   Influenza,inj,Quad PF,6+ Mos 09/22/2013, 10/03/2015, 10/02/2020   Influenza,inj,Quad PF,6-35 Mos 12/31/2017   Influenza,inj,quad, With Preservative 09/22/2013, 10/03/2015   PFIZER(Purple Top)SARS-COV-2 Vaccination 03/15/2020, 04/09/2020   Tdap 12/30/2011   Zoster Recombinant(Shingrix) 10/02/2020, 07/05/2021    Past Medical History:  Diagnosis Date   Anxiety    Depression    Questionable per records. Not on any medication.    History of kidney stones    Hx of echocardiogram    a. Echo 12/13/12: Mild LVH, EF 50-55%, grade 1 diastolic dysfunction, mild LAE, mild RVE, mild RAE   Hypertension    Insomnia    Left knee pain    Low back pain    Migraine    Noncompliance    OSA (obstructive sleep apnea)    a. Sleep Study 02/2013:  mod OSA, AHI 33 per hour, O2 sat nadir 85%    Tobacco History: Social History   Tobacco Use  Smoking Status Never  Smokeless Tobacco Never   Counseling given: Not Answered   Outpatient Medications Prior to Visit  Medication Sig Dispense Refill   allopurinol  (ZYLOPRIM ) 100 MG tablet Take 1 tablet (100 mg total) by mouth daily. 90 tablet 4C   amLODipine  (NORVASC ) 10 MG tablet Take 1 tablet (10 mg total) by mouth daily. 90 tablet 1   atorvastatin  (LIPITOR) 20 MG tablet Take 1 tablet by mouth once daily 90 tablet 1   carvedilol  (COREG ) 6.25 MG tablet Take 1 tablet by mouth twice daily 60 tablet 0   DULoxetine (CYMBALTA) 60 MG capsule Take 60 mg by mouth 2 (two) times daily.     gabapentin  (NEURONTIN ) 400 MG capsule Take 1 capsule by mouth at bedtime 30 capsule 0   losartan (COZAAR) 50 MG tablet Take 50 mg by mouth daily.     tamsulosin  (FLOMAX ) 0.4 MG CAPS capsule Take 0.4 mg by mouth  daily.     Facility-Administered Medications Prior to Visit  Medication Dose Route Frequency Provider Last Rate Last Admin   0.9 %  sodium chloride  infusion  500 mL Intravenous Continuous Aneita Gwendlyn DASEN, MD         Review of Systems:   Constitutional:   No  weight loss, night sweats,  Fevers, chills, +fatigue, or  lassitude.  HEENT:   No headaches,  Difficulty swallowing,  Tooth/dental problems, or  Sore throat,                No sneezing, itching, ear ache, nasal congestion, post nasal drip,  CV:  No chest pain,  Orthopnea, PND, swelling in lower extremities, anasarca, dizziness, palpitations, syncope.   GI  No heartburn, indigestion, abdominal pain, nausea, vomiting, diarrhea, change in bowel habits, loss of appetite, bloody stools.   Resp  No excess mucus, no productive cough,  No non-productive cough,  No coughing up of blood.  No change in color of mucus.  No wheezing.  No chest wall deformity  Skin: no rash or lesions.  GU: no dysuria, change in color of urine, no urgency or frequency.  No flank pain, no hematuria   MS:  No joint pain or swelling.  No decreased range of motion.  +back pain.    Physical Exam  BP (!) 120/98 (BP Location: Left Arm, Cuff Size: Large)   Pulse 72   SpO2 100%   GEN: A/Ox3; pleasant , NAD, well nourished    HEENT:  [place]/AT,  EACs-clear, TMs-wnl, NOSE-clear, THROAT-clear, no lesions, no postnasal drip or exudate noted.  Class 4 MP airway  NECK:  Supple w/ fair ROM; no JVD; normal carotid impulses w/o bruits; no thyromegaly or nodules palpated; no lymphadenopathy.    RESP  Clear  P & A; w/o, wheezes/ rales/ or rhonchi. no accessory muscle use, no dullness to percussion  CARD:  RRR, no m/r/g, no peripheral edema, pulses intact, no cyanosis or clubbing.  GI:   Soft & nt; nml bowel sounds; no organomegaly or masses detected.   Musco: Warm bil, no deformities or joint swelling noted.   Neuro: alert, no focal deficits noted.    Skin: Warm,  no lesions or rashes    Lab Results:   BNP    Component Value Date/Time   BNP 20.2 04/03/2017 0844    ProBNP No results found for: PROBNP  Imaging: No results found.  Administration History     None           No data to display          No results found for: NITRICOXIDE      Assessment & Plan:   No problem-specific Assessment & Plan notes found for this encounter.  Assessment and Plan    Severe Complex Sleep Apnea Severe complex sleep apnea with predominant central sleep apnea Previous CPAP and BiPAP therapies were perceived as intolerant.  However patient did not come back for follow-up.  We had a long discussion regarding potential complications of untreated sleep apnea.  A 2020 sleep study showed 65 events per hour with significant hypoxia. Untreated sleep apnea poses risks such as cardiovascular complications, stroke, diabetes, and increased accident risk due to daytime sleepiness. Not a candidate for Inspire therapy due to central sleep apnea and BMI over 40.  BiPAP is the most effective treatment. Weight loss and a healthy sleep regimen are crucial adjuncts. Discussed potential insurance issues regarding new BiPAP coverage and the need for a titration study. Order a BiPAP titration study at [facility]. Advise locating and bringing in the old BiPAP machine if possible.  Recommend weight loss and a healthy sleep regimen. Discuss new medication Zepbound for weight loss with the primary care physician.  To see if he is a candidate  Hypertension Chronic hypertension is managed with Norvasc , has an extensive history with difficult control blood pressure.  Have advised that need to work on management of his sleep apnea.  Morbid obesity.  Healthy weight loss discussed in detail.  As above discussed possible medications to help with weight loss  Chronic Back Pain Chronic  back pain-currently not on daily narcotics.  Managed with gabapentin  and Cymbalta.   Continue follow-up with orthopedics.  Use caution with any sedating medications  General Health Maintenance Emphasized the importance of weight loss and a healthy sleep regimen to manage sleep apnea and overall health.   Follow-up Follow up in six weeks with Dr. Neda or Romond Pipkins NP and As needed           Madelin Stank, NP 02/04/2024

## 2024-02-04 NOTE — Progress Notes (Signed)
 Cardiology Office Note:    Date:  02/06/2024   ID:  Michael [name], DOB December 11, 1971, MRN [id number]  PCP:  Loreli Elsie JONETTA Mickey., MD  Cardiologist:  Lonni LITTIE Nanas, MD  Electrophysiologist:  None   Referring MD: Maree Leni Edyth DELENA, MD   Chief Complaint  Patient presents with   Shortness of Breath    History of Present Illness:    Michael [name] is a 53 y.o. male with a hx of hypertension, OSA who presents for follow-up.  He was referred by Greig Drones, NP for preop evaluation prior to back surgery, initially seen 11/27/2022.  Previously seen by Dr. Shlomo, last seen 05/08/2017.  He reports he has been having shortness of breath, particularly notices it when walking up stairs.  Does not exercise much, limited by back pain.  Denies any chest pain, lightheadedness, syncope, lower extremity edema, or palpitations.  Smoked occasionally as a teenager/early 20s but none since.  Family history includes father had MI in 36s.  Echocardiogram 05/21/2017 showed moderate LVH, EF 60 to 65%, normal RV function, no significant valvular disease.  Lexiscan  Myoview  05/22/2017 showed normal perfusion, EF 54%.  Coronary CTA 12/16/2022 showed minimal CAD (1 to 24% stenosis in proximal RCA), calcium  score 0, dilated ascending aorta measuring 44 mm.  Echocardiogram 12/25/2022 shows normal biventricular function, no significant valvular disease, dilated ascending aorta measuring 44 mm.  Since last clinic visit, he reports he is doing okay.  He continues to have shortness of breath, denies any chest pain.  Denies any lightheadedness, syncope, lower extremity edema, or palpitations.  Reports he increased his losartan last week.   BP Readings from Last 3 Encounters:  02/05/24 (!) 130/100  02/04/24 (!) 120/98  09/23/23 139/89     Past Medical History:  Diagnosis Date   Anxiety    Depression    Questionable per records. Not on any medication.    History of kidney stones    Hx of echocardiogram    a. Echo  12/13/12: Mild LVH, EF 50-55%, grade 1 diastolic dysfunction, mild LAE, mild RVE, mild RAE   Hypertension    Insomnia    Left knee pain    Low back pain    Migraine    Noncompliance    OSA (obstructive sleep apnea)    a. Sleep Study 02/2013:  mod OSA, AHI 33 per hour, O2 sat nadir 85%    Past Surgical History:  Procedure Laterality Date   Admission  04/2012   Malignant HTN, HA.  CT head negative, cardiology consult.     CYSTOSCOPY WITH RETROGRADE PYELOGRAM, URETEROSCOPY AND STENT PLACEMENT Left 01/24/2019   Procedure: CYSTOSCOPY WITH RETROGRADE PYELOGRAM, URETEROSCOPY AND STENT PLACEMENT;  Surgeon: Sherrilee Belvie LITTIE, MD;  Location: [facility];  Service: Urology;  Laterality: Left;   HOLMIUM LASER APPLICATION Left 01/24/2019   Procedure: HOLMIUM LASER APPLICATION;  Surgeon: Sherrilee Belvie LITTIE, MD;  Location[facility];  Service: Urology;  Laterality: Left;   MANDIBLE SURGERY  1998   metal plate    Current Medications: Current Meds  Medication Sig   allopurinol  (ZYLOPRIM ) 100 MG tablet Take 1 tablet (100 mg total) by mouth daily.   amLODipine  (NORVASC ) 10 MG tablet Take 1 tablet (10 mg total) by mouth daily.   atorvastatin  (LIPITOR) 20 MG tablet Take 1 tablet by mouth once daily   carvedilol  (COREG ) 6.25 MG tablet Take 1 tablet by mouth twice daily   DULoxetine (CYMBALTA) 60 MG capsule Take 60 mg  by mouth 2 (two) times daily.   gabapentin  (NEURONTIN ) 400 MG capsule Take 1 capsule by mouth at bedtime   losartan (COZAAR) 50 MG tablet Take 50 mg by mouth daily.   tamsulosin  (FLOMAX ) 0.4 MG CAPS capsule Take 0.4 mg by mouth daily.   Current Facility-Administered Medications for the 02/05/24 encounter (Office Visit) with Kate Lonni CROME, MD  Medication   0.9 %  sodium chloride  infusion     Allergies:   Statins   Social History   Socioeconomic History   Marital status: Single    Spouse name: Not on file   Number of children: 4   Years of  education: Not on file   Highest education level: Not on file  Occupational History   Occupation: Unemployed//disabled  Tobacco Use   Smoking status: Never   Smokeless tobacco: Never  Vaping Use   Vaping status: Never Used  Substance and Sexual Activity   Alcohol use: No    Alcohol/week: 0.0 standard drinks of alcohol   Drug use: Yes    Types: Marijuana   Sexual activity: Yes    Comment: per pt's blue health form - 1 sex partner in last 12 months  Other Topics Concern   Not on file  Social History Narrative   Marital status: single; living with fiance      Children: 4 children, no grandchildren.      Employment:  Disability for learning disabilities since 2012.   Right-handed   Caffeine: occasional         Social Drivers of Corporate Investment Banker Strain: High Risk (09/08/2021)   Overall Financial Resource Strain (CARDIA)    Difficulty of Paying Living Expenses: Very hard  Food Insecurity: No Food Insecurity (09/08/2021)   Hunger Vital Sign    Worried About Running Out of Food in the Last Year: Never true    Ran Out of Food in the Last Year: Never true  Transportation Needs: No Transportation Needs (09/08/2021)   PRAPARE - Administrator, Civil Service (Medical): No    Lack of Transportation (Non-Medical): No  Physical Activity: Inactive (09/08/2021)   Exercise Vital Sign    Days of Exercise per Week: 0 days    Minutes of Exercise per Session: 0 min  Stress: Stress Concern Present (09/08/2021)   [employer] of Occupational Health - Occupational Stress Questionnaire    Feeling of Stress : To some extent  Social Connections: Unknown (09/30/2023)   Received from [facility]   Social Network    Social Network: Not on file     Family History: The patient's family history includes Cancer in his father, maternal grandfather, and paternal grandfather; Colon cancer in his father; Diabetes in his mother; Heart disease (age of onset: 66) in his father;  Hypertension in his brother, father, and mother. There is no history of Esophageal cancer, Stomach cancer, or Rectal cancer.  ROS:   Please see the history of present illness.     All other systems reviewed and are negative.  EKGs/Labs/Other Studies Reviewed:    The following studies were reviewed today:   EKG:   11/27/2022: Normal sinus rhythm, rate 80, T wave inversions in leads V3-5 05/19/2023: Normal sinus rhythm, rate 75, PACs, nonspecific T wave flattening 02/05/2024: Normal sinus rhythm, rate 69, nonspecific T wave flattening  Recent Labs: 07/29/2023: Hemoglobin 13.2; Platelets 184 02/05/2024: BUN 14; Creatinine, Ser 1.02; Potassium 3.7; Sodium 142  Recent Lipid Panel    Component Value Date/Time  CHOL 141 02/05/2024 1143   TRIG 62 02/05/2024 1143   HDL 39 (L) 02/05/2024 1143   CHOLHDL 3.6 02/05/2024 1143   CHOLHDL 3.2 06/13/2015 1118   VLDL 8 06/13/2015 1118   LDLCALC 89 02/05/2024 1143    Physical Exam:    VS:  BP (!) 130/100   Pulse 69   Ht 5' 10 (1.778 m)   Wt 292 lb 9.6 oz (132.7 kg)   SpO2 97%   BMI 41.98 kg/m     Wt Readings from Last 3 Encounters:  02/05/24 292 lb 9.6 oz (132.7 kg)  09/23/23 293 lb (132.9 kg)  07/29/23 283 lb 1.1 oz (128.4 kg)     GEN:  Well nourished, well developed in no acute distress HEENT: Normal NECK: No JVD; No carotid bruits LYMPHATICS: No lymphadenopathy CARDIAC: RRR, no murmurs, rubs, gallops RESPIRATORY:  Clear to auscultation without rales, wheezing or rhonchi  ABDOMEN: Soft, non-tender, non-distended MUSCULOSKELETAL:  No edema; No deformity  SKIN: Warm and dry NEUROLOGIC:  Alert and oriented x 3 PSYCHIATRIC:  Normal affect   ASSESSMENT:    1. DOE (dyspnea on exertion)   2. Aortic dilatation (HCC)   3. Essential hypertension   4. Morbid obesity (HCC)   5. Hyperlipidemia, unspecified hyperlipidemia type     PLAN:    DOE: Coronary CTA 12/16/2022 showed minimal CAD (1 to 24% stenosis in proximal RCA), calcium   score 0, dilated ascending aorta measuring 44 mm.  Echocardiogram 12/25/2022 shows normal biventricular function, no significant valvular disease, dilated ascending aorta measuring 44 mm.  Aortic dilatation: Ascending aorta measured 44 mm on coronary CTA 12/16/2022.  Measured 44 mm on echocardiogram 12/25/2022 -Repeat echocardiogram to monitor  LVH: Moderate LVH noted on echo 04/2017.  Likely due to hypertension.  Echo 11/2022 showed mild LVH  Hypertension: On amlodipine  10 mg daily, carvedilol  6.25 mg twice daily, and losartan 100 mg daily -Reports he increased his losartan to 100 mg daily last week, will check BMET.  Asked to check BP daily for next 2 weeks and let us  know results  Morbid obesity: Body mass index is 41.98 kg/m.  Diet/exercise encouraged.  Will refer to healthy weight and wellness  OSA: on CPAP but has not been using, having issues with his mask.  Saw sleep medicine 02/04/24, has upcoming sleep study  Hyperlipidemia: On atorvastatin  20 mg daily.  Check lipid panel  RTC in 6 months    Medication Adjustments/Labs and Tests Ordered: Current medicines are reviewed at length with the patient today.  Concerns regarding medicines are outlined above.  Orders Placed This Encounter  Procedures   Basic Metabolic Panel (BMET)   Lipid panel   Amb Ref to Medical Weight Management   EKG 12-Lead   No orders of the defined types were placed in this encounter.   Patient Instructions  Medication Instructions:  Continue current medication *If you need a refill on your cardiac medications before your next appointment, please call your pharmacy*   Lab Work: Bmet, lipid panel If you have labs (blood work) drawn today and your tests are completely normal, you will receive your results only by: MyChart Message (if you have MyChart) OR A paper copy in the mail If you have any lab test that is abnormal or we need to change your treatment, we will call you to review the  results.   Testing/Procedures: ECHO please reschedule asap   Follow-Up: At [facility], you and your health needs are our priority.  As part  of our continuing mission to provide you with exceptional heart care, we have created designated Provider Care Teams.  These Care Teams include your primary Cardiologist (physician) and Advanced Practice Providers (APPs -  Physician Assistants and Nurse Practitioners) who all work together to provide you with the care you need, when you need it.  We recommend signing up for the patient portal called MyChart.  Sign up information is provided on this After Visit Summary.  MyChart is used to connect with patients for Virtual Visits (Telemedicine).  Patients are able to view lab/test results, encounter notes, upcoming appointments, etc.  Non-urgent messages can be sent to your provider as well.   To learn more about what you can do with MyChart, go to [email or web address].    Your next appointment:   6 month(s)  Provider:   Lonni LITTIE Nanas, MD     Other Instructions Please check blood pressure daily for next couple weeks and send those reading to [facility]  Referral to Health Weight and Wellness         Signed, Lonni LITTIE Nanas, MD  02/06/2024 9:10 PM     [facility]

## 2024-02-05 ENCOUNTER — Ambulatory Visit: Payer: 59 | Attending: Cardiology | Admitting: Cardiology

## 2024-02-05 ENCOUNTER — Encounter: Payer: Self-pay | Admitting: Cardiology

## 2024-02-05 VITALS — BP 130/100 | HR 69 | Ht 70.0 in | Wt 292.6 lb

## 2024-02-05 DIAGNOSIS — Z13220 Encounter for screening for lipoid disorders: Secondary | ICD-10-CM

## 2024-02-05 DIAGNOSIS — R0609 Other forms of dyspnea: Secondary | ICD-10-CM

## 2024-02-05 DIAGNOSIS — I10 Essential (primary) hypertension: Secondary | ICD-10-CM | POA: Diagnosis not present

## 2024-02-05 DIAGNOSIS — E785 Hyperlipidemia, unspecified: Secondary | ICD-10-CM

## 2024-02-05 DIAGNOSIS — I77819 Aortic ectasia, unspecified site: Secondary | ICD-10-CM | POA: Diagnosis not present

## 2024-02-05 NOTE — Patient Instructions (Signed)
 Medication Instructions:  Continue current medication *If you need a refill on your cardiac medications before your next appointment, please call your pharmacy*   Lab Work: Bmet, lipid panel If you have labs (blood work) drawn today and your tests are completely normal, you will receive your results only by: MyChart Message (if you have MyChart) OR A paper copy in the mail If you have any lab test that is abnormal or we need to change your treatment, we will call you to review the results.   Testing/Procedures: ECHO please reschedule asap   Follow-Up: At [facility], you and your health needs are our priority.  As part of our continuing mission to provide you with exceptional heart care, we have created designated Provider Care Teams.  These Care Teams include your primary Cardiologist (physician) and Advanced Practice Providers (APPs -  Physician Assistants and Nurse Practitioners) who all work together to provide you with the care you need, when you need it.  We recommend signing up for the patient portal called MyChart.  Sign up information is provided on this After Visit Summary.  MyChart is used to connect with patients for Virtual Visits (Telemedicine).  Patients are able to view lab/test results, encounter notes, upcoming appointments, etc.  Non-urgent messages can be sent to your provider as well.   To learn more about what you can do with MyChart, go to [email or web address].    Your next appointment:   6 month(s)  Provider:   Lonni LITTIE Nanas, MD     Other Instructions Please check blood pressure daily for next couple weeks and send those reading to [facility]  Referral to Health Weight and Wellness

## 2024-02-06 LAB — LIPID PANEL
Chol/HDL Ratio: 3.6 {ratio} (ref 0.0–5.0)
Cholesterol, Total: 141 mg/dL (ref 100–199)
HDL: 39 mg/dL — ABNORMAL LOW (ref >39)
LDL Chol Calc (NIH): 89 mg/dL (ref 0–99)
Triglycerides: 62 mg/dL (ref 0–149)
VLDL Cholesterol Cal: 13 mg/dL (ref 5–40)

## 2024-02-06 LAB — BASIC METABOLIC PANEL
BUN/Creatinine Ratio: 14 (ref 9–20)
BUN: 14 mg/dL (ref 6–24)
CO2: 24 mmol/L (ref 20–29)
Calcium: 9 mg/dL (ref 8.7–10.2)
Chloride: 102 mmol/L (ref 96–106)
Creatinine, Ser: 1.02 mg/dL (ref 0.76–1.27)
Glucose: 102 mg/dL — ABNORMAL HIGH (ref 70–99)
Potassium: 3.7 mmol/L (ref 3.5–5.2)
Sodium: 142 mmol/L (ref 134–144)
eGFR: 87 mL/min/{1.73_m2} (ref >59)

## 2024-02-08 ENCOUNTER — Encounter: Payer: Self-pay | Admitting: *Deleted

## 2024-02-26 ENCOUNTER — Ambulatory Visit (HOSPITAL_COMMUNITY): Payer: 59

## 2024-03-07 ENCOUNTER — Encounter (INDEPENDENT_AMBULATORY_CARE_PROVIDER_SITE_OTHER): Payer: Medicare Other | Admitting: Internal Medicine

## 2024-03-09 ENCOUNTER — Encounter (INDEPENDENT_AMBULATORY_CARE_PROVIDER_SITE_OTHER): Admitting: Internal Medicine

## 2024-03-24 ENCOUNTER — Ambulatory Visit: Payer: 59 | Admitting: Pulmonary Disease

## 2024-03-25 ENCOUNTER — Ambulatory Visit (HOSPITAL_BASED_OUTPATIENT_CLINIC_OR_DEPARTMENT_OTHER): Payer: 59 | Attending: Adult Health | Admitting: Pulmonary Disease

## 2024-03-25 DIAGNOSIS — G4719 Other hypersomnia: Secondary | ICD-10-CM | POA: Insufficient documentation

## 2024-03-25 DIAGNOSIS — R0683 Snoring: Secondary | ICD-10-CM | POA: Insufficient documentation

## 2024-03-25 DIAGNOSIS — G4739 Other sleep apnea: Secondary | ICD-10-CM | POA: Insufficient documentation

## 2024-03-27 ENCOUNTER — Other Ambulatory Visit: Payer: Self-pay | Admitting: Physical Medicine & Rehabilitation

## 2024-03-27 DIAGNOSIS — M48061 Spinal stenosis, lumbar region without neurogenic claudication: Secondary | ICD-10-CM

## 2024-03-28 ENCOUNTER — Ambulatory Visit (HOSPITAL_COMMUNITY): Attending: Cardiology

## 2024-03-28 ENCOUNTER — Encounter (HOSPITAL_COMMUNITY): Payer: Self-pay

## 2024-03-28 ENCOUNTER — Encounter (HOSPITAL_COMMUNITY): Payer: Self-pay | Admitting: Cardiology

## 2024-03-29 MED ORDER — GABAPENTIN 400 MG PO CAPS
400.0000 mg | ORAL_CAPSULE | Freq: Every day | ORAL | 0 refills | Status: AC
Start: 2024-03-29 — End: ?

## 2024-03-30 ENCOUNTER — Telehealth: Payer: Self-pay | Admitting: Pulmonary Disease

## 2024-03-30 DIAGNOSIS — G4739 Other sleep apnea: Secondary | ICD-10-CM

## 2024-03-30 NOTE — Telephone Encounter (Signed)
 Call patient  Sleep study result  Date of study: 03/25/2024  Impression: Complex sleep apnea Sleep apnea was optimally treated with BiPAP  Recommendation: BiPAP 16/12 with heated humidification with a large Simplus full-face mask Optimize sleep hygiene Follow-up as previously scheduled with compliance monitoring

## 2024-03-30 NOTE — Procedures (Signed)
 [facility] [address] [place] Maroon [place], [place] [address] Tel: [phone]   Fax: [phone]  Titration Interpretation  Patient Name:  Michael [name], Michael [name] Date:  03/25/2024 Referring Physician:  Rubye Oaks MD  Indications for Polysomnography The patient is a 53 year old Male who is 5\' 10"  and weighs 292.0 lbs. His BMI equals 42.3.  A full night titration treatment study was performed.  Medication  No Data.   Polysomnogram Data A full night polysomnogram recorded the standard physiologic parameters including EEG, EOG, EMG, EKG, nasal and oral airflow.  Respiratory parameters of chest and abdominal movements were recorded with Respiratory Inductance Plethysmography belts.  Oxygen saturation was recorded by pulse oximetry.   Sleep Architecture The total recording time of the polysomnogram was 365.4 minutes.  The total sleep time was 334.5 minutes.  The patient spent 2.8% of total sleep time in Stage N1, 92.1% in Stage N2, 0.0% in Stages N3, and 5.1% in REM.  Sleep latency was 2.9 minutes.  REM latency was 218.0 minutes.  Sleep Efficiency was 91.5%.  Wake after Sleep Onset time was 28.5 minutes.  Titration Summary The patient was titrated at pressures ranging from 8/4/0* cm/H20 with supplemental oxygen at - up to 16/12/0* cm/H20 with supplemental oxygen at -.  The last pressure used in the study was 16/12/0* cm/H20 with supplemental oxygen at -.  Respiratory Events The polysomnogram revealed a presence of 8 obstructive, 7 centrals, and - mixed apneas resulting in an Apnea index of 2.7 events per hour.  There were 97 hypopneas (>=3% desaturation and/or arousal) resulting in an Apnea\Hypopnea Index (AHI >=3% desaturation and/or arousal) of 20.1 events per hour.  There were 59 hypopneas (>=4% desaturation) resulting in an Apnea\Hypopnea Index (AHI >=4% desaturation) of 13.3 events per hour.  There were - Respiratory Effort Related Arousals resulting in a RERA  index of - events per hour. The Respiratory Disturbance Index is 20.1 events per hour.  The snore index was - events per hour.  Mean oxygen saturation was 95.1%.  The lowest oxygen saturation during sleep was 79.0%.  Time spent <=88% oxygen saturation was 1.6 minutes (0.4%).  Limb Activity There were - limb movements recorded.  Of this total, - were classified as PLMs.  Of the PLMs, - were associated with arousals.  The Limb Movement index was - per hour while the PLM index was - per hour.  Cardiac Summary The average pulse rate was 66.7 bpm.  The minimum pulse rate was 60.0 bpm while the maximum pulse rate was 88.0 bpm.  Cardiac rhythm was normal/abnormal.  Diagnosis:  Complex obstructive sleep apnea Optimal titration was achieved with BiPAP Final BiPAP pressures of 16/12 with AHI of 2.2 No oxygen supplementation required Large Simplus fullface mask was used for titration  Recommendations: BiPAP 16/12 with heated humidification with a large Simplus full-face mask Optimize sleep hygiene Follow-up as previously scheduled with compliance monitoring  This study was personally reviewed and electronically signed by: Virl Diamond MD Accredited Board Certified in Sleep Medicine Date/Time: 03/30/24   Titration Report  Patient Name: Michael [name], Michael [name] Date: 03/25/2024  Date of Birth: Mar 14, 1971 Study Type: Bilevel Titration  Age: 76 year MRN #: [id number]  Sex: Male Interpreting Physician: Virl Diamond [id number]  Height: 5\' 10"  Referring Physician: Rubye Oaks MD  Weight: 292.0 lbs Recording Tech: Cherylann Parr RPSGT RST  BMI: 42.3 Scoring Tech: Cherylann Parr RPSGT RST  ESS: 16 Neck Size: 18  Mask Type Simplus Full Face Mask Final Pressure:  16/12cmH20  Mask Size: Large Supplemental O2: N/A   Study Overview  Lights Off: 10:47:00 PM  Count Index  Lights On: 04:52:27 AM Awakenings: 16 2.9  Time in Bed: 365.4 min. Arousals: 3 0.5  Total Sleep Time: 334.5 min. AHI (>=3% Desat  and/or Ar.): 112 20.1   Sleep Efficiency: 91.5% AHI (>=4% Desat): 74 13.3   Sleep Latency: 2.9 min. Limb Movements: - -  Wake After Sleep Onset: 28.5 min. Snore: - -  REM Latency from Sleep Onset: 218.0 min. Desaturations: 120 21.5     Minimum SpO2 TST: 79.0%    Sleep Architecture  % of Time in Bed Stages Time (mins) % Sleep Time  Wake 31.5   Stage N1 9.5 2.8%  Stage N2 308.0 92.1%  Stage N3 0.0 0.0%  REM 17.0 5.1%   Arousal Summary   NREM REM Sleep Index  Respiratory Arousals - - - -  PLM Arousals - - - -  Isolated Limb Movement Arousals - - - -  Snore Arousals - - - -  Spontaneous Arousals 3 - 3 0.5  Total 3 - 3 0.5   Limb Movement Summary   Count Index  Isolated Limb Movements - -  Periodic Limb Movements (PLMs) - -  Total Limb Movements - -  Respiratory Summary   By Sleep Stage By Body Position Total   NREM REM Supine Non-Supine   Time (min) 317.5 17.0 167.5 167.0 334.5         Obstructive Apnea 8 - - 8 8  Mixed Apnea - - - - -  Central Apnea 7 - 4 3 7   Total Apneas 15 - 4 11 15   Total Apnea Index 2.8 - 1.4 4.0 2.7         Hypopneas (>=3% Desat and/or Ar.) 94 3 72 25 97  AHI (>=3% Desat and/or Ar.) 20.6 10.6 27.2 12.9 20.1         Hypopneas (>=4% Desat) 59 - 46 13 59  AHI (>=4% Desat) 14.0 - 17.9 8.6 13.3          RERAs - - - - -  RERA Index - - - - -         RDI 20.6 10.6 27.2 12.9 20.1     Respiratory Event Durations   Apnea Hypopnea   NREM REM NREM REM  Average (seconds) 22.0 - 28.9 32.8  Maximum (seconds) 44.4 - 66.0 36.6    Oxygen Saturation Summary   Wake NREM REM TST TIB  Average SpO2 96.2% 94.9% 95.8% 95.0% 95.1%  Minimum SpO2 87.0% 79.0% 93.0% 79.0% 79.0%  Maximum SpO2 100.0% 99.0% 98.0% 99.0% 100.0%   Oxygen Saturation Distribution  Range (%) Time in range (min) Time in range (%)  90.0 - 100.0 360.7 98.8%  80.0 - 90.0 4.2 1.1%  70.0 - 80.0 0.1 0.0%  60.0 - 70.0 - -  50.0 - 60.0 - -  0.0 - 50.0 - -  Time Spent <=88%  SpO2  Range (%) Time in range (min) Time in range (%)  0.0 - 88.0 1.6 0.4%      Count Index  Desaturations 120 21.5    Cardiac Summary   Wake NREM REM Sleep Total  Average Pulse Rate (BPM) 71.1 66.0 72.2 66.3 66.7  Minimum Pulse Rate (BPM) 62.0 60.0 67.0 60.0 60.0  Maximum Pulse Rate (BPM) 88.0 83.0 83.0 83.0 88.0   Pulse Rate Distribution:  Range (bpm) Time in range (min) Time in range (%)  0.0 - 40.0 - -  40.0 - 60.0 0.0 0.0%  60.0 - 80.0 361.7 99.2%  80.0 - 100.0 2.9 0.8%  100.0 - 120.0 - -  120.0 - 140.0 - -  140.0 - 200.0 - -   Titration Summary  PAP Device PAP Level O2 Level Time (min) Wake (min) NREM (min) REM (min) Sleep Eff% OA# CA# MA# Hyp# (>=3%) AHI (>=3%) Hyp# (>=4%) AHI (>=%4) RERA RDI SpO2 <=88% (min) Min SpO2 Mean SpO2 Ar. Index  - Off - 1.0 1.0 0.0 0.0 0.0%               BiLevel 8/4/0 - 36.0 8.0 28.0 0.0 77.8% - - - 16 34.3 14  30.0 -  34.3  0.0 90.0 93.8 2.1  BiLevel 10/6/0 - 55.5 1.5 54.0 0.0 97.3% - 1 - 25 28.9 21  24.4 -  28.9  0.3 87.0 93.3 1.1  BiLevel 12/8/0 - 18.0 2.0 16.0 0.0 88.9% - - - 11 41.3 4  15.0 -  41.3  0.0 92.0 94.2 -  BiLevel 13/9/0 - 109.0 3.5 105.5 0.0 96.8% 7 6 - 33 26.2 15  15.9 -  26.2  0.5 84.0 94.6 -  BiLevel 14/10/0 - 25.5 2.0 6.5 17.0 92.2% 1 - - 8 23.0 3  10.2 -  23.0  0.8 79.0 95.2 -  BiLevel 16/12/0 - 121.0 13.5 107.5 0.0 88.8% - - - 4 2.2 2  1.1 -  2.2  0.0 91.0 96.5 0.6    Hypnograms                           Technologist Comments  STUDY TYPE= BIPAP DATE OF STUDY= 03/25/2024 FINAL PRESSURE= BIPAP 16/12cmH20 Patient Present here for Bipap Titration. Pressure was started on 8/4cmh20. Pressure was increased accordingly. Patient slept right and supine. No significant PLMs. ECG appeared to be normal sinus rhythm.  Optimal Pressure was reached at Bipap 16/12cmh20. No Bathroom breaks. Simplus Full Face Mask Large was used for this study. Patient tolerated the mask. No Bathroom breaks

## 2024-03-30 NOTE — Procedures (Deleted)
    NAME: Damieon Zietz DATE OF BIRTH:  June 14, 1971 MEDICAL RECORD NUMBER [id number]  LOCATION: [facility]  PHYSICIAN: Wells Mabe A Trayce Caravello  DATE OF STUDY: 03/25/2024  SLEEP STUDY TYPE: Out of Center Sleep Test                REFERRING PHYSICIAN: Parrett, Tammy S, NP  INDICATION FOR STUDY: ***  EPWORTH SLEEPINESS SCORE:   HEIGHT: 5\' 10"  (177.8 cm)  WEIGHT: 292 lb (132.5 kg)    Body mass index is 41.9 kg/m.  NECK SIZE: 18 in.  MEDICATIONS: ***  IMPRESSION:  ***    RECOMMENDATION:  ***   Cohan Stipes A Sofiya Ezelle Diplomate, American Board of Sleep Medicine  ELECTRONICALLY SIGNED ON:  03/30/2024, 5:03 AM [facility] PH: [phone]   FX: [phone] ACCREDITED BY THE AMERICAN ACADEMY OF SLEEP MEDICINE

## 2024-03-31 NOTE — Telephone Encounter (Signed)
 ATC patient was unable to leave a VM on patient's phone VM is full.

## 2024-04-06 ENCOUNTER — Ambulatory Visit: Payer: 59 | Admitting: Pulmonary Disease

## 2024-04-07 NOTE — Telephone Encounter (Signed)
 Please set up for virtual visit to discuss sleep study results and treatment plan.  May double book

## 2024-04-07 NOTE — Telephone Encounter (Signed)
 Routing to front desk for scheduling

## 2024-04-22 ENCOUNTER — Encounter: Payer: Self-pay | Admitting: Adult Health

## 2024-04-22 ENCOUNTER — Telehealth: Admitting: Adult Health

## 2024-04-22 DIAGNOSIS — G4731 Primary central sleep apnea: Secondary | ICD-10-CM

## 2024-04-22 DIAGNOSIS — G4739 Other sleep apnea: Secondary | ICD-10-CM | POA: Diagnosis not present

## 2024-04-22 NOTE — Patient Instructions (Signed)
 Begin BiPAP at bedtime, will wear all night long for at least 6 or more hours Work on healthy weight loss Healthy sleep regimen  Use caution with sedating medications  Follow up with Dr. Gaynell Keeler or Devell Parkerson NP in 3 months and As needed

## 2024-04-22 NOTE — Progress Notes (Signed)
 Virtual Visit via Video Note  I connected with Michael [name] on 04/22/24 at  3:00 PM EDT by a video enabled telemedicine application and verified that I am speaking with the correct person using two identifiers.  Location: Patient: Home  Provider: Office    I discussed the limitations of evaluation and management by telemedicine and the availability of in person appointments. The patient expressed understanding and agreed to proceed.  History of Present Illness: 53 year old male seen for sleep consult February 2025 to reestablish for sleep apnea.  He has a history of complex sleep apnea with central predominance Former patient last seen in 2017. Medical history significant for diastolic heart failure, malignant hypertension   Today's video visit is a 65-month follow-up for sleep apnea.  Patient was seen in February to reestablish for complex sleep apnea.  Patient had previously been on BiPAP in the past but stopped.  Patient complained of snoring, daytime sleepiness and fatigue.  He was set up for a titration study done on March 25, 2024 that showed complex obstructive sleep apnea with optimal control on BiPAP 16/12.  No oxygen supplementation was required.  Titration study was done with a large Simplus fullface mask. We discussed his study results in detail.  Patient is ready to get started back on BiPAP.  He wants to feel better and have more energy.  Has very restless sleep currently.  Past Medical History:  Diagnosis Date   Anxiety    Depression    Questionable per records. Not on any medication.    History of kidney stones    Hx of echocardiogram    a. Echo 12/13/12: Mild LVH, EF 50-55%, grade 1 diastolic dysfunction, mild LAE, mild RVE, mild RAE   Hypertension    Insomnia    Left knee pain    Low back pain    Migraine    Noncompliance    OSA (obstructive sleep apnea)    a. Sleep Study 02/2013:  mod OSA, AHI 33 per hour, O2 sat nadir 85%   Current Outpatient Medications on File  Prior to Visit  Medication Sig Dispense Refill   allopurinol  (ZYLOPRIM ) 100 MG tablet Take 1 tablet (100 mg total) by mouth daily. 90 tablet 4C   amLODipine  (NORVASC ) 10 MG tablet Take 1 tablet (10 mg total) by mouth daily. 90 tablet 1   atorvastatin  (LIPITOR) 20 MG tablet Take 1 tablet by mouth once daily 90 tablet 1   carvedilol  (COREG ) 6.25 MG tablet Take 1 tablet by mouth twice daily 60 tablet 0   DULoxetine (CYMBALTA) 60 MG capsule Take 60 mg by mouth 2 (two) times daily.     gabapentin  (NEURONTIN ) 400 MG capsule Take 1 capsule (400 mg total) by mouth at bedtime. 30 capsule 0   losartan (COZAAR) 50 MG tablet Take 50 mg by mouth daily.     tamsulosin  (FLOMAX ) 0.4 MG CAPS capsule Take 0.4 mg by mouth daily.     Current Facility-Administered Medications on File Prior to Visit  Medication Dose Route Frequency Provider Last Rate Last Admin   0.9 %  sodium chloride  infusion  500 mL Intravenous Continuous Asencion Blacksmith, MD          Observations/Objective: Echo 12/13/12 >> EF 50 to 55%, grade 1 diastolic dysfx PSG 02/17/13 >> AHI 33, SpO2 low 85% Sleep study September 15, 2019 showed AHI at 65.5/hour with a REM AHI 83/hour and supine AHI 90/hour with an SpO2 low at 76%, spent below 89% for 126 minutes.  Titration study October 19, 2019 showed optimal control on BiPAP 16/11 cm H2O.  Assessment and Plan: Complex sleep apnea.  Titration study shows optimal BiPAP control on IPAP max 16 and EPAP minimum 12- Orders placed.  - discussed how weight can impact sleep and risk for sleep disordered breathing - discussed options to assist with weight loss: combination of diet modification, cardiovascular and strength training exercises   - had an extensive discussion regarding the adverse health consequences related to untreated sleep disordered breathing - specifically discussed the risks for hypertension, coronary artery disease, cardiac dysrhythmias, cerebrovascular disease, and diabetes -  lifestyle modification discussed   - discussed how sleep disruption can increase risk of accidents, particularly when driving - safe driving practices were discussed    Morbid obesity.  Encouraged on healthy weight loss.  Hypertension continue follow-up with primary care.  Plan  . Patient Instructions  Begin BiPAP at bedtime, will wear all night long for at least 6 or more hours Work on healthy weight loss Healthy sleep regimen  Use caution with sedating medications  Follow up with Dr. Gaynell Keeler or Tinaya Ceballos NP in 3 months and As needed      Follow Up Instructions:    I discussed the assessment and treatment plan with the patient. The patient was provided an opportunity to ask questions and all were answered. The patient agreed with the plan and demonstrated an understanding of the instructions.   The patient was advised to call back or seek an in-person evaluation if the symptoms worsen or if the condition fails to improve as anticipated.  I provided 30  minutes of non-face-to-face time during this encounter.   Roena Clark, NP

## 2024-05-18 ENCOUNTER — Other Ambulatory Visit (HOSPITAL_COMMUNITY): Payer: 59

## 2024-05-25 ENCOUNTER — Ambulatory Visit: Admitting: Orthopedic Surgery

## 2024-05-31 ENCOUNTER — Ambulatory Visit (INDEPENDENT_AMBULATORY_CARE_PROVIDER_SITE_OTHER): Admitting: Adult Health

## 2024-05-31 ENCOUNTER — Encounter: Payer: Self-pay | Admitting: Adult Health

## 2024-05-31 VITALS — BP 140/120 | HR 67 | Ht 70.0 in | Wt 290.8 lb

## 2024-05-31 DIAGNOSIS — Z6841 Body Mass Index (BMI) 40.0 and over, adult: Secondary | ICD-10-CM | POA: Diagnosis not present

## 2024-05-31 DIAGNOSIS — G4739 Other sleep apnea: Secondary | ICD-10-CM

## 2024-05-31 NOTE — Patient Instructions (Addendum)
 Continue on BiPAP at bedtime, will wear all night long for at least 6 or more hours Work on healthy weight loss-discuss with Primary MD to see if you are a candidate for Zepbound.  Healthy sleep regimen  Use caution with sedating medications  Follow up with Primary for hypertension/elevated blood pressure  Follow up with Dr. Gaynell Keeler or Keeleigh Terris NP in 2-3 months and As needed

## 2024-05-31 NOTE — Assessment & Plan Note (Signed)
 Encouraged on healthy weight loss.  Patient education given on Zepbound-as it is approved for weight loss with sleep apnea.

## 2024-05-31 NOTE — Progress Notes (Signed)
 @Patient  ID: Michael [name], male    DOB: 1971-06-15, 53 y.o.   MRN: [id number]  Chief Complaint  Patient presents with   Follow-up    Referring provider: Jeannine Milroy., MD  HPI: 53 year old male seen for sleep consult February 2025 to reestablish for sleep apnea.  He has a history of complex sleep apnea with central predominance on BIPAP.  Former patient previously seen last in 2017 Medical history significant for diastolic heart failure, malignant hypertension  TEST/EVENTS :  Echo 12/13/12 >> EF 50 to 55%, grade 1 diastolic dysfx PSG 02/17/13 >> AHI 33, SpO2 low 85% Sleep study September 15, 2019 showed AHI at 65.5/hour with a REM AHI 83/hour and supine AHI 90/hour with an SpO2 low at 76%, spent below 89% for 126 minutes. Titration study October 19, 2019 showed optimal control on BiPAP 16/11 cm H2O.  05/31/2024 Follow up ; OSA  Patient returns for a follow-up visit.  Patient has complex sleep apnea.  Previously was on BiPAP briefly but stopped using.  Sleep study in 2020 showed severe sleep apnea with AHI at 65.5/hour and SpO2 low 76%.  Patient continued to have ongoing symptoms of snoring, daytime sleepiness and fatigue.  He was set up for a titration study done on March 25, 2024 that showed complex obstructive sleep apnea with optimal control on BiPAP 16/12.  No oxygen supplementation was required.  Titration study was done with a large Simplus full facemask.  Patient was recommended to begin on BiPAP.  Patient says he received his BiPAP few days ago.  Patient says he is tolerating it and feels that he is starting to benefit from it.  He is using a fullface mask.  Download shows usage over the last 4 days at around 4 hours.  AHI 6.2/hour.  IPAP 16 EPAP minimum 12 cm H2O. We discussed BIPAP care.  Patient says he is working on weight loss.  Current weight at 290 pounds with a BMI of 41.   Allergies  Allergen Reactions   Statins Other (See Comments)    Unknown reaction     Immunization History  Administered Date(s) Administered   Influenza Split 10/23/2012   Influenza,inj,Quad PF,6+ Mos 09/22/2013, 10/03/2015, 10/02/2020   Influenza,inj,Quad PF,6-35 Mos 12/31/2017   Influenza,inj,quad, With Preservative 09/22/2013, 10/03/2015   PFIZER(Purple Top)SARS-COV-2 Vaccination 03/15/2020, 04/09/2020   Tdap 12/30/2011   Zoster Recombinant(Shingrix) 10/02/2020, 07/05/2021    Past Medical History:  Diagnosis Date   Anxiety    Depression    Questionable per records. Not on any medication.    History of kidney stones    Hx of echocardiogram    a. Echo 12/13/12: Mild LVH, EF 50-55%, grade 1 diastolic dysfunction, mild LAE, mild RVE, mild RAE   Hypertension    Insomnia    Left knee pain    Low back pain    Migraine    Noncompliance    OSA (obstructive sleep apnea)    a. Sleep Study 02/2013:  mod OSA, AHI 33 per hour, O2 sat nadir 85%    Tobacco History: Social History   Tobacco Use  Smoking Status Never  Smokeless Tobacco Never   Counseling given: No   Outpatient Medications Prior to Visit  Medication Sig Dispense Refill   allopurinol  (ZYLOPRIM ) 100 MG tablet Take 1 tablet (100 mg total) by mouth daily. 90 tablet 4C   amLODipine  (NORVASC ) 10 MG tablet Take 1 tablet (10 mg total) by mouth daily. 90 tablet 1   atorvastatin  (LIPITOR) 20  MG tablet Take 1 tablet by mouth once daily 90 tablet 1   carvedilol  (COREG ) 6.25 MG tablet Take 1 tablet by mouth twice daily 60 tablet 0   DULoxetine (CYMBALTA) 60 MG capsule Take 60 mg by mouth 2 (two) times daily.     gabapentin  (NEURONTIN ) 400 MG capsule Take 1 capsule (400 mg total) by mouth at bedtime. (Patient not taking: Reported on 05/31/2024) 30 capsule 0   losartan (COZAAR) 50 MG tablet Take 50 mg by mouth daily.     tamsulosin  (FLOMAX ) 0.4 MG CAPS capsule Take 0.4 mg by mouth daily.     Facility-Administered Medications Prior to Visit  Medication Dose Route Frequency Provider Last Rate Last Admin   0.9 %   sodium chloride  infusion  500 mL Intravenous Continuous Asencion Blacksmith, MD         Review of Systems:   Constitutional:   No  weight loss, night sweats,  Fevers, chills, +fatigue, or  lassitude.  HEENT:   No headaches,  Difficulty swallowing,  Tooth/dental problems, or  Sore throat,                No sneezing, itching, ear ache, nasal congestion, post nasal drip,   CV:  No chest pain,  Orthopnea, PND, swelling in lower extremities, anasarca, dizziness, palpitations, syncope.   GI  No heartburn, indigestion, abdominal pain, nausea, vomiting, diarrhea, change in bowel habits, loss of appetite, bloody stools.   Resp: No shortness of breath with exertion or at rest.  No excess mucus, no productive cough,  No non-productive cough,  No coughing up of blood.  No change in color of mucus.  No wheezing.  No chest wall deformity  Skin: no rash or lesions.  GU: no dysuria, change in color of urine, no urgency or frequency.  No flank pain, no hematuria   MS:  No joint pain or swelling.  No decreased range of motion.  No back pain.    Physical Exam  BP (!) 140/120 (BP Location: Right Arm, Cuff Size: Large)   Pulse 67   Ht 5\' 10"  (1.778 m)   Wt 290 lb 12.8 oz (131.9 kg)   SpO2 97%   BMI 41.73 kg/m   GEN: A/Ox3; pleasant , NAD, well nourished    HEENT:  [place]/AT,   NOSE-clear, THROAT-clear, no lesions, no postnasal drip or exudate noted.   NECK:  Supple w/ fair ROM; no JVD; normal carotid impulses w/o bruits; no thyromegaly or nodules palpated; no lymphadenopathy.    RESP  Clear  P & A; w/o, wheezes/ rales/ or rhonchi. no accessory muscle use, no dullness to percussion  CARD:  RRR, no m/r/g, no peripheral edema, pulses intact, no cyanosis or clubbing.  GI:   Soft & nt; nml bowel sounds; no organomegaly or masses detected.   Musco: Warm bil, no deformities or joint swelling noted.   Neuro: alert, no focal deficits noted.    Skin: Warm, no lesions or rashes    Lab  Results:  CBC  ProBNP No results found for: "PROBNP"  Imaging: No results found.  Administration History     None           No data to display          No results found for: "NITRICOXIDE"      Assessment & Plan:   Complex sleep apnea syndrome Complex sleep apnea.  Recent titration study showed optimal control on BiPAP 16/12.  Patient has recently received his  BiPAP in the last week.  Patient is encouraged on nightly use.  Patient education given BiPAP care.  Plan  Patient Instructions  Continue on BiPAP at bedtime, will wear all night long for at least 6 or more hours Work on healthy weight loss-discuss with Primary MD to see if you are a candidate for Zepbound.  Healthy sleep regimen  Use caution with sedating medications  Follow up with Primary for hypertension/elevated blood pressure  Follow up with Dr. Gaynell Keeler or Vlada Uriostegui NP in 2-3 months and As needed      Morbid obesity (HCC) Encouraged on healthy weight loss.  Patient education given on Zepbound-as it is approved for weight loss with sleep apnea.     Roena Clark, NP 05/31/2024

## 2024-05-31 NOTE — Assessment & Plan Note (Signed)
 Complex sleep apnea.  Recent titration study showed optimal control on BiPAP 16/12.  Patient has recently received his BiPAP in the last week.  Patient is encouraged on nightly use.  Patient education given BiPAP care.  Plan  Patient Instructions  Continue on BiPAP at bedtime, will wear all night long for at least 6 or more hours Work on healthy weight loss-discuss with Primary MD to see if you are a candidate for Zepbound.  Healthy sleep regimen  Use caution with sedating medications  Follow up with Primary for hypertension/elevated blood pressure  Follow up with Dr. Gaynell Keeler or Anique Beckley NP in 2-3 months and As needed

## 2024-08-14 ENCOUNTER — Ambulatory Visit
Admission: RE | Admit: 2024-08-14 | Discharge: 2024-08-14 | Disposition: A | Discharge status: Home / Self Care | Source: Ambulatory Visit | Attending: Family Medicine | Admitting: Family Medicine

## 2024-08-14 VITALS — BP 154/115 | HR 72 | Temp 99.0°F | Resp 20

## 2024-08-14 DIAGNOSIS — Z113 Encounter for screening for infections with a predominantly sexual mode of transmission: Secondary | ICD-10-CM | POA: Diagnosis not present

## 2024-08-14 NOTE — Discharge Instructions (Addendum)
 We will let you know about your test results tomorrow and treat any infections you test positive for.

## 2024-08-14 NOTE — ED Triage Notes (Signed)
Pt requesting STD testing-denies sx-NAD-steady gait

## 2024-08-14 NOTE — ED Provider Notes (Signed)
 [facility]  Note:  This document was prepared using Conservation officer, historic buildings and may include unintentional dictation errors.  MRN: [id number] DOB: [birth year]  Subjective:   Michael [name] is a 53 y.o. male presenting for STD screen.  Denies dysuria, hematuria, urinary frequency, penile discharge, penile swelling, testicular pain, testicular swelling, anal pain, groin pain. Is not sure about an exposure. Had anal sex with his only partner and wants to be checked.    Current Facility-Administered Medications:    0.9 %  sodium chloride  infusion, 500 mL, Intravenous, Continuous, Aneita Gwendlyn DASEN, MD  Current Outpatient Medications:    allopurinol  (ZYLOPRIM ) 100 MG tablet, Take 1 tablet (100 mg total) by mouth daily., Disp: 90 tablet, Rfl: 4C   amLODipine  (NORVASC ) 10 MG tablet, Take 1 tablet (10 mg total) by mouth daily., Disp: 90 tablet, Rfl: 1   atorvastatin  (LIPITOR) 20 MG tablet, Take 1 tablet by mouth once daily, Disp: 90 tablet, Rfl: 1   carvedilol  (COREG ) 6.25 MG tablet, Take 1 tablet by mouth twice daily, Disp: 60 tablet, Rfl: 0   DULoxetine (CYMBALTA) 60 MG capsule, Take 60 mg by mouth 2 (two) times daily., Disp: , Rfl:    gabapentin  (NEURONTIN ) 400 MG capsule, Take 1 capsule (400 mg total) by mouth at bedtime. (Patient not taking: Reported on 05/31/2024), Disp: 30 capsule, Rfl: 0   losartan (COZAAR) 50 MG tablet, Take 50 mg by mouth daily., Disp: , Rfl:    tamsulosin  (FLOMAX ) 0.4 MG CAPS capsule, Take 0.4 mg by mouth daily., Disp: , Rfl:    Allergies  Allergen Reactions   Statins Other (See Comments)    Unknown reaction    Past Medical History:  Diagnosis Date   Anxiety    Depression    Questionable per records. Not on any medication.    History of kidney stones    Hx of echocardiogram    a. Echo 12/13/12: Mild LVH, EF 50-55%, grade 1 diastolic dysfunction, mild LAE, mild RVE, mild RAE   Hypertension    Insomnia    Left knee pain    Low  back pain    Migraine    Noncompliance    OSA (obstructive sleep apnea)    a. Sleep Study 02/2013:  mod OSA, AHI 33 per hour, O2 sat nadir 85%     Past Surgical History:  Procedure Laterality Date   Admission  04/2012   Malignant HTN, HA.  CT head negative, cardiology consult.     CYSTOSCOPY WITH RETROGRADE PYELOGRAM, URETEROSCOPY AND STENT PLACEMENT Left 01/24/2019   Procedure: CYSTOSCOPY WITH RETROGRADE PYELOGRAM, URETEROSCOPY AND STENT PLACEMENT;  Surgeon: Sherrilee Belvie CROME, MD;  Location: [facility];  Service: Urology;  Laterality: Left;   HOLMIUM LASER APPLICATION Left 01/24/2019   Procedure: HOLMIUM LASER APPLICATION;  Surgeon: Sherrilee Belvie CROME, MD;  Location: [facility];  Service: Urology;  Laterality: Left;   MANDIBLE SURGERY  1998   metal plate    Family History  Problem Relation Age of Onset   Cancer Father        prostate, colon- age was in his 42's   Heart disease Father 63       MI    Hypertension Father    Colon cancer Father    Diabetes Mother    Hypertension Mother    Hypertension Brother    Cancer Maternal Grandfather    Cancer Paternal Grandfather    Esophageal cancer Neg Hx  Stomach cancer Neg Hx    Rectal cancer Neg Hx     Social History   Tobacco Use   Smoking status: Never   Smokeless tobacco: Never  Vaping Use   Vaping status: Never Used  Substance Use Topics   Alcohol use: No    Alcohol/week: 0.0 standard drinks of alcohol   Drug use: Yes    Types: Marijuana    ROS   Objective:   Vitals: BP (!) 154/115 (BP Location: Right Arm)   Pulse 72   Temp 99 F (37.2 C) (Oral)   Resp 20   SpO2 96%   Physical Exam Constitutional:      General: He is not in acute distress.    Appearance: Normal appearance. He is well-developed and normal weight. He is not ill-appearing, toxic-appearing or diaphoretic.  HENT:     Head: Normocephalic and atraumatic.     Right Ear: External ear normal.     Left Ear:  External ear normal.     Nose: Nose normal.     Mouth/Throat:     Pharynx: Oropharynx is clear.  Eyes:     General: No scleral icterus.       Right eye: No discharge.        Left eye: No discharge.     Extraocular Movements: Extraocular movements intact.  Cardiovascular:     Rate and Rhythm: Normal rate.  Pulmonary:     Effort: Pulmonary effort is normal.  Musculoskeletal:     Cervical back: Normal range of motion.  Neurological:     Mental Status: He is alert and oriented to person, place, and time.  Psychiatric:        Mood and Affect: Mood normal.        Behavior: Behavior normal.        Thought Content: Thought content normal.        Judgment: Judgment normal.     Assessment and Plan :   PDMP not reviewed this encounter.  1. Screen for STD (sexually transmitted disease)    Will treat as appropriate based off of test results.    Christopher Savannah, [place] 08/14/24 [phone]

## 2024-08-15 ENCOUNTER — Ambulatory Visit (HOSPITAL_COMMUNITY): Payer: Self-pay

## 2024-08-15 LAB — CYTOLOGY, (ORAL, ANAL, URETHRAL) ANCILLARY ONLY
Chlamydia: NEGATIVE
Comment: NEGATIVE
Comment: NEGATIVE
Comment: NORMAL
Neisseria Gonorrhea: NEGATIVE
Trichomonas: POSITIVE — AB

## 2024-08-15 MED ORDER — METRONIDAZOLE 500 MG PO TABS: 2000.0000 mg | ORAL_TABLET | Freq: Once | ORAL | 0 refills | Status: AC

## 2024-08-16 LAB — RPR: RPR Ser Ql: NONREACTIVE

## 2024-08-16 LAB — HIV ANTIBODY (ROUTINE TESTING W REFLEX): HIV Screen 4th Generation wRfx: NONREACTIVE

## 2024-10-31 ENCOUNTER — Encounter: Payer: Self-pay | Admitting: Radiology

## 2025-01-10 ENCOUNTER — Other Ambulatory Visit: Payer: Self-pay | Admitting: Family Medicine

## 2025-01-10 ENCOUNTER — Ambulatory Visit
Admission: RE | Admit: 2025-01-10 | Discharge: 2025-01-10 | Disposition: A | Source: Ambulatory Visit | Attending: Family Medicine | Admitting: Family Medicine

## 2025-01-10 DIAGNOSIS — G8929 Other chronic pain: Secondary | ICD-10-CM
